# Patient Record
Sex: Male | Born: 1945 | Race: White | Hispanic: No | Marital: Married | Smoking: Never smoker
Health system: Southern US, Community
[De-identification: ages and names within clinical notes are randomized; demographics above are authoritative.]

## PROBLEM LIST (undated history)

## (undated) DIAGNOSIS — I639 Cerebral infarction, unspecified: Secondary | ICD-10-CM

## (undated) HISTORY — PX: TONSILLECTOMY: SUR1361

---

## 2020-12-07 ENCOUNTER — Emergency Department (HOSPITAL_COMMUNITY): Payer: Medicare Other

## 2020-12-07 ENCOUNTER — Inpatient Hospital Stay (HOSPITAL_COMMUNITY)
Admission: EM | Admit: 2020-12-07 | Discharge: 2020-12-13 | DRG: 040 | Disposition: A | Discharge status: Rehabilitation Facility | Payer: Medicare Other | Attending: Family Medicine | Admitting: Family Medicine

## 2020-12-07 ENCOUNTER — Encounter (HOSPITAL_COMMUNITY): Payer: Self-pay | Admitting: Internal Medicine

## 2020-12-07 DIAGNOSIS — R471 Dysarthria and anarthria: Secondary | ICD-10-CM | POA: Diagnosis present

## 2020-12-07 DIAGNOSIS — M8448XA Pathological fracture, other site, initial encounter for fracture: Secondary | ICD-10-CM | POA: Diagnosis present

## 2020-12-07 DIAGNOSIS — R531 Weakness: Secondary | ICD-10-CM

## 2020-12-07 DIAGNOSIS — I6340 Cerebral infarction due to embolism of unspecified cerebral artery: Principal | ICD-10-CM | POA: Diagnosis present

## 2020-12-07 DIAGNOSIS — I639 Cerebral infarction, unspecified: Secondary | ICD-10-CM

## 2020-12-07 DIAGNOSIS — N319 Neuromuscular dysfunction of bladder, unspecified: Secondary | ICD-10-CM | POA: Diagnosis present

## 2020-12-07 DIAGNOSIS — R001 Bradycardia, unspecified: Secondary | ICD-10-CM

## 2020-12-07 DIAGNOSIS — N5089 Other specified disorders of the male genital organs: Secondary | ICD-10-CM | POA: Diagnosis present

## 2020-12-07 DIAGNOSIS — G4700 Insomnia, unspecified: Secondary | ICD-10-CM | POA: Diagnosis present

## 2020-12-07 DIAGNOSIS — G8250 Quadriplegia, unspecified: Secondary | ICD-10-CM | POA: Diagnosis present

## 2020-12-07 DIAGNOSIS — R29716 NIHSS score 16: Secondary | ICD-10-CM | POA: Diagnosis not present

## 2020-12-07 DIAGNOSIS — R29718 NIHSS score 18: Secondary | ICD-10-CM | POA: Diagnosis not present

## 2020-12-07 DIAGNOSIS — Z8249 Family history of ischemic heart disease and other diseases of the circulatory system: Secondary | ICD-10-CM

## 2020-12-07 DIAGNOSIS — Z79899 Other long term (current) drug therapy: Secondary | ICD-10-CM

## 2020-12-07 DIAGNOSIS — Z66 Do not resuscitate: Secondary | ICD-10-CM | POA: Diagnosis present

## 2020-12-07 DIAGNOSIS — R29711 NIHSS score 11: Secondary | ICD-10-CM | POA: Diagnosis not present

## 2020-12-07 DIAGNOSIS — R29713 NIHSS score 13: Secondary | ICD-10-CM | POA: Diagnosis present

## 2020-12-07 DIAGNOSIS — Z28310 Unvaccinated for covid-19: Secondary | ICD-10-CM

## 2020-12-07 DIAGNOSIS — K592 Neurogenic bowel, not elsewhere classified: Secondary | ICD-10-CM | POA: Diagnosis present

## 2020-12-07 DIAGNOSIS — R338 Other retention of urine: Secondary | ICD-10-CM | POA: Diagnosis present

## 2020-12-07 DIAGNOSIS — M899 Disorder of bone, unspecified: Secondary | ICD-10-CM

## 2020-12-07 DIAGNOSIS — I10 Essential (primary) hypertension: Secondary | ICD-10-CM | POA: Diagnosis present

## 2020-12-07 DIAGNOSIS — R7989 Other specified abnormal findings of blood chemistry: Secondary | ICD-10-CM | POA: Diagnosis present

## 2020-12-07 DIAGNOSIS — R9389 Abnormal findings on diagnostic imaging of other specified body structures: Secondary | ICD-10-CM

## 2020-12-07 DIAGNOSIS — F32A Depression, unspecified: Secondary | ICD-10-CM | POA: Diagnosis present

## 2020-12-07 DIAGNOSIS — Z20822 Contact with and (suspected) exposure to covid-19: Secondary | ICD-10-CM | POA: Diagnosis present

## 2020-12-07 DIAGNOSIS — G9511 Acute infarction of spinal cord (embolic) (nonembolic): Secondary | ICD-10-CM | POA: Diagnosis present

## 2020-12-07 DIAGNOSIS — D65 Disseminated intravascular coagulation [defibrination syndrome]: Secondary | ICD-10-CM | POA: Diagnosis present

## 2020-12-07 DIAGNOSIS — E8770 Fluid overload, unspecified: Secondary | ICD-10-CM | POA: Diagnosis present

## 2020-12-07 DIAGNOSIS — N401 Enlarged prostate with lower urinary tract symptoms: Secondary | ICD-10-CM | POA: Diagnosis present

## 2020-12-07 LAB — COMPREHENSIVE METABOLIC PANEL
ALT: 13 U/L (ref 0–44)
AST: 30 U/L (ref 15–41)
Albumin: 4 g/dL (ref 3.5–5.0)
Alkaline Phosphatase: 53 U/L (ref 38–126)
Anion gap: 10 (ref 5–15)
BUN: 13 mg/dL (ref 8–23)
CO2: 23 mmol/L (ref 22–32)
Calcium: 9.6 mg/dL (ref 8.9–10.3)
Chloride: 102 mmol/L (ref 98–111)
Creatinine, Ser: 1 mg/dL (ref 0.61–1.24)
GFR, Estimated: >60 mL/min (ref >60)
Glucose, Bld: 124 mg/dL — ABNORMAL HIGH (ref 70–99)
Potassium: 4.1 mmol/L (ref 3.5–5.1)
Sodium: 135 mmol/L (ref 135–145)
Total Bilirubin: 2.9 mg/dL — ABNORMAL HIGH (ref 0.3–1.2)
Total Protein: 6.1 g/dL — ABNORMAL LOW (ref 6.5–8.1)

## 2020-12-07 LAB — URINALYSIS, COMPLETE (UACMP) WITH MICROSCOPIC
Bilirubin Urine: NEGATIVE
Glucose, UA: NEGATIVE mg/dL
Ketones, ur: 5 mg/dL — AB
Leukocytes,Ua: NEGATIVE
Nitrite: NEGATIVE
Protein, ur: >=300 mg/dL — AB
Specific Gravity, Urine: 1.008 (ref 1.005–1.030)
pH: 8 (ref 5.0–8.0)

## 2020-12-07 LAB — RESP PANEL BY RT-PCR (FLU A&B, COVID) ARPGX2
Influenza A by PCR: NEGATIVE
Influenza B by PCR: NEGATIVE
SARS Coronavirus 2 by RT PCR: NEGATIVE

## 2020-12-07 LAB — RAPID URINE DRUG SCREEN, HOSP PERFORMED
Amphetamines: NOT DETECTED
Barbiturates: NOT DETECTED
Benzodiazepines: NOT DETECTED
Cocaine: NOT DETECTED
Opiates: NOT DETECTED
Tetrahydrocannabinol: NOT DETECTED

## 2020-12-07 LAB — CBC WITH DIFFERENTIAL/PLATELET
Abs Immature Granulocytes: 0.07 K/uL (ref 0.00–0.07)
Basophils Absolute: 0 K/uL (ref 0.0–0.1)
Basophils Relative: 0 %
Eosinophils Absolute: 0 K/uL (ref 0.0–0.5)
Eosinophils Relative: 0 %
HCT: 43.1 % (ref 39.0–52.0)
Hemoglobin: 15.2 g/dL (ref 13.0–17.0)
Immature Granulocytes: 1 %
Lymphocytes Relative: 4 %
Lymphs Abs: 0.3 K/uL — ABNORMAL LOW (ref 0.7–4.0)
MCH: 31.9 pg (ref 26.0–34.0)
MCHC: 35.3 g/dL (ref 30.0–36.0)
MCV: 90.4 fL (ref 80.0–100.0)
Monocytes Absolute: 0.6 K/uL (ref 0.1–1.0)
Monocytes Relative: 7 %
Neutro Abs: 7.7 K/uL (ref 1.7–7.7)
Neutrophils Relative %: 88 %
Platelets: 103 K/uL — ABNORMAL LOW (ref 150–400)
RBC: 4.77 MIL/uL (ref 4.22–5.81)
RDW: 12.6 % (ref 11.5–15.5)
WBC: 8.8 K/uL (ref 4.0–10.5)
nRBC: 0 % (ref 0.0–0.2)

## 2020-12-07 LAB — PROTIME-INR
INR: 1.8 — ABNORMAL HIGH (ref 0.8–1.2)
Prothrombin Time: 20.5 s — ABNORMAL HIGH (ref 11.4–15.2)

## 2020-12-07 LAB — ETHANOL: Alcohol, Ethyl (B): <10 mg/dL

## 2020-12-07 LAB — CK: Total CK: 82 U/L (ref 49–397)

## 2020-12-07 LAB — CBG MONITORING, ED: Glucose-Capillary: 111 mg/dL — ABNORMAL HIGH (ref 70–99)

## 2020-12-07 IMAGING — DX DG CHEST 1V PORT
1 series · 1 of 1 positions shown · non-contrast
Comparison: None.

CLINICAL DATA: Altered level of consciousness.

EXAM:
PORTABLE CHEST 1 VIEW

[chest]
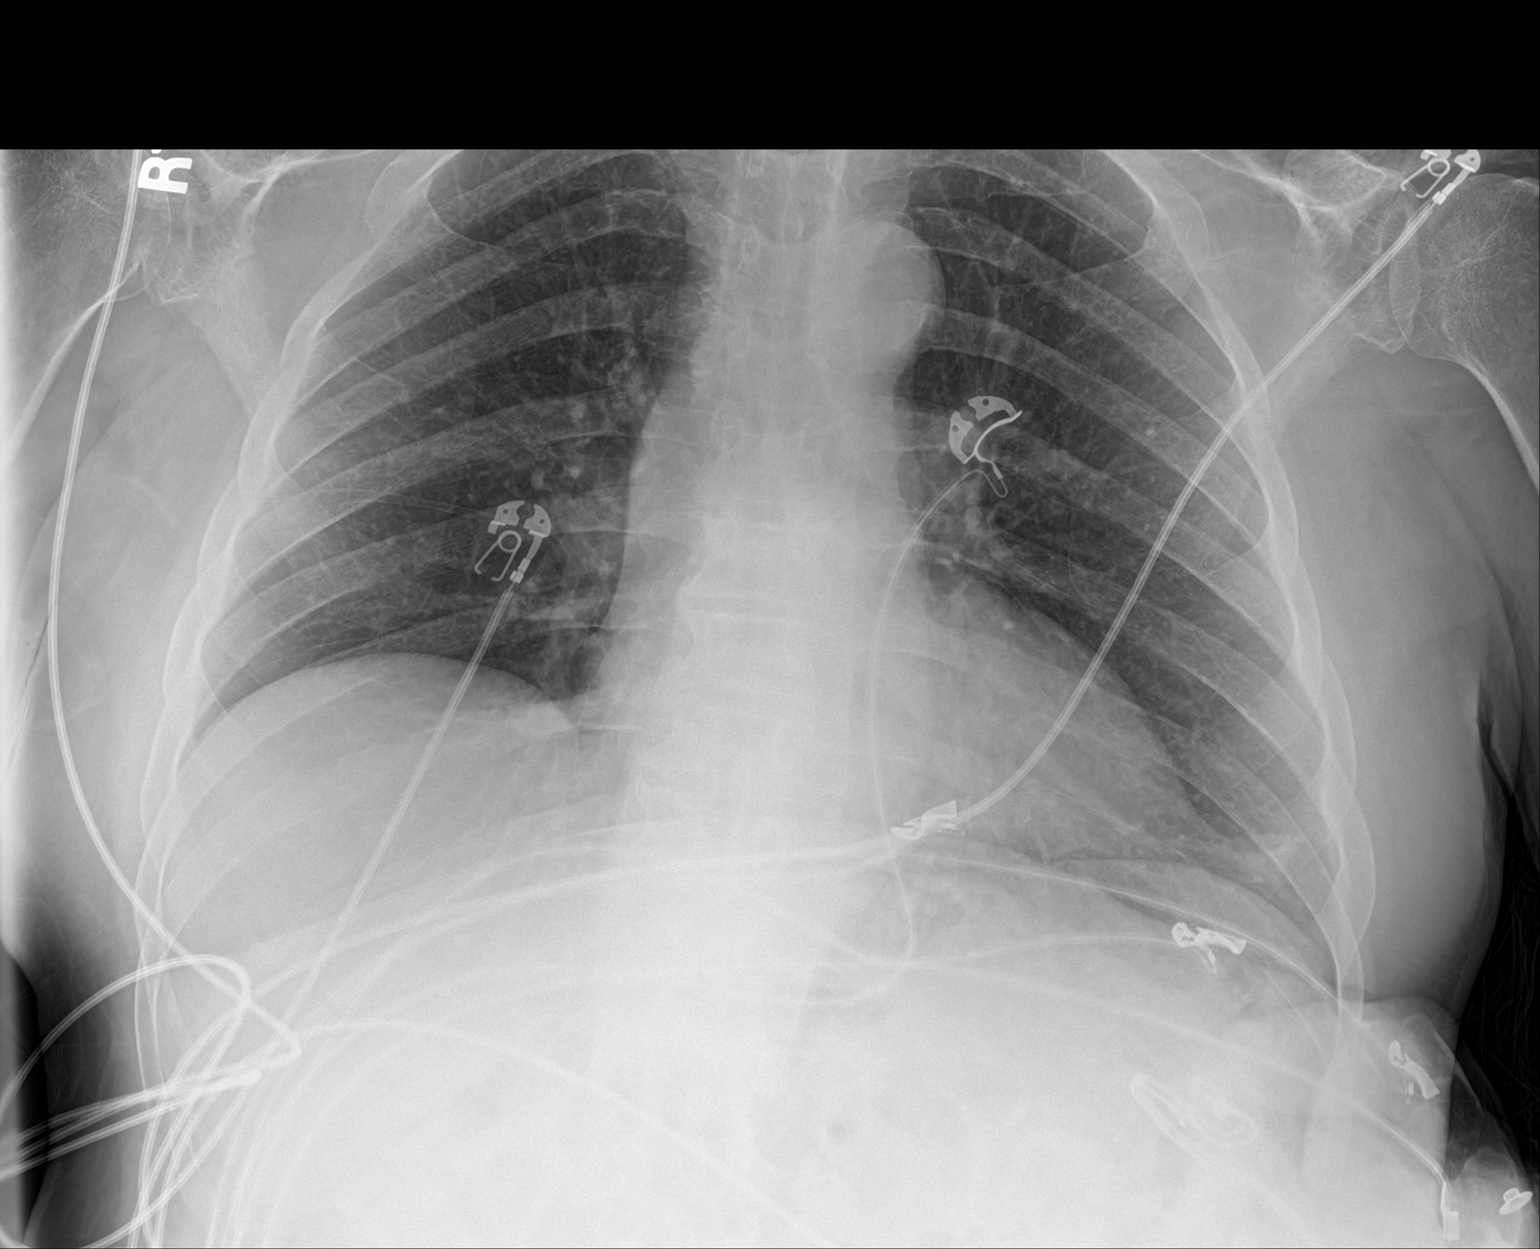

[1 of 1 positions shown; findings below may reference images not displayed]

FINDINGS: The heart size and mediastinal contours are within normal limits.
Both lungs are clear. The visualized skeletal structures are
unremarkable.
IMPRESSION: No active disease.

## 2020-12-07 IMAGING — MR MR HEAD WO/W CM
6 of 12 series · 26 of 48 positions shown · IV contrast (gadavist)
Comparison: Head CTA 522

CLINICAL DATA: Neuro deficit, acute, stroke suspected.  Weakness.

EXAM:
MRI HEAD WITHOUT AND WITH CONTRAST
TECHNIQUE: Multiplanar, multiecho pulse sequences of the brain and surrounding
structures were obtained without and with intravenous contrast.
CONTRAST:  7mL GADAVIST GADOBUTROL 1 MMOL/ML IV SOLN

[Series 4: DWI · axial · 3.0mm · 0.94mm/px · z∈[+2,+158]mm · 8 of 107 slices shown (1 of 2)]
[im 1/107]
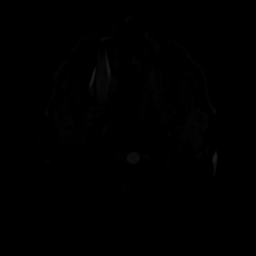
[im 16/107]
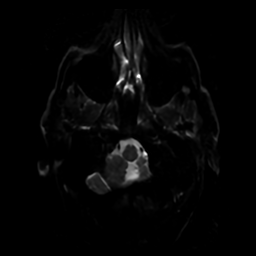
[im 31/107]
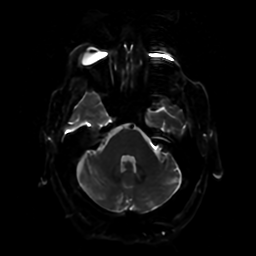
[im 46/107]
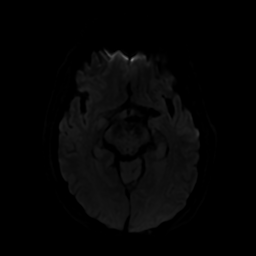
[im 61/107]
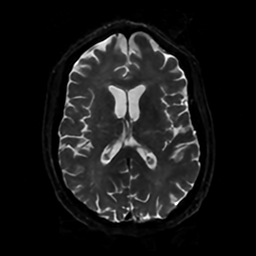
[im 76/107]
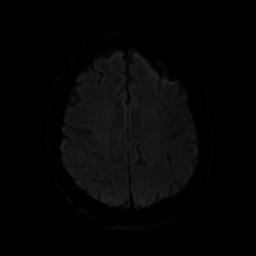
[im 91/107]
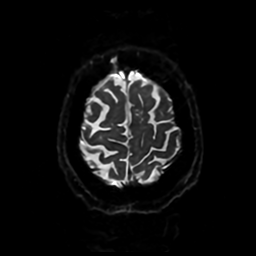
[im 107/107]
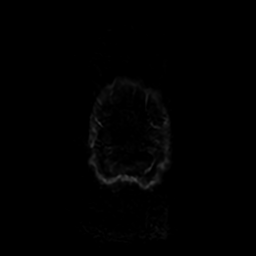

[Series 5: DWI · coronal · 4.0mm · 0.94mm/px · 6 of 81 slices shown (2 of 2)]
[im 1/81]
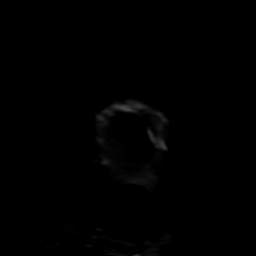
[im 17/81]
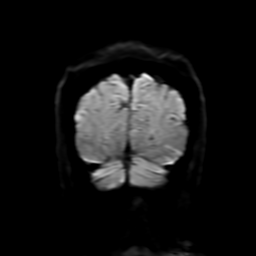
[im 33/81]
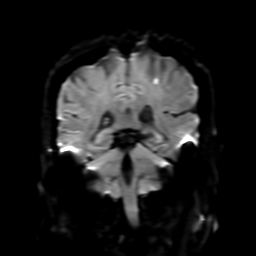
[im 49/81]
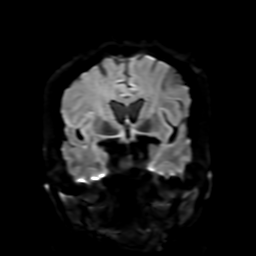
[im 65/81]
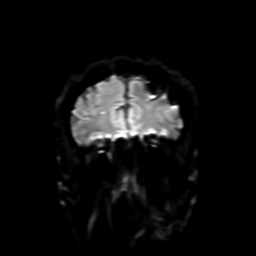
[im 81/81]
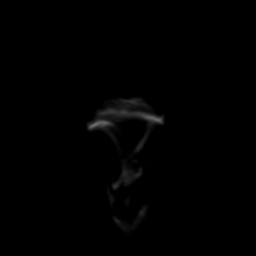

[Series 6: FLAIR · sagittal · 5.0mm · 0.23mm/px · 2 of 25 slices shown (1 of 2)]
[im 1/25]
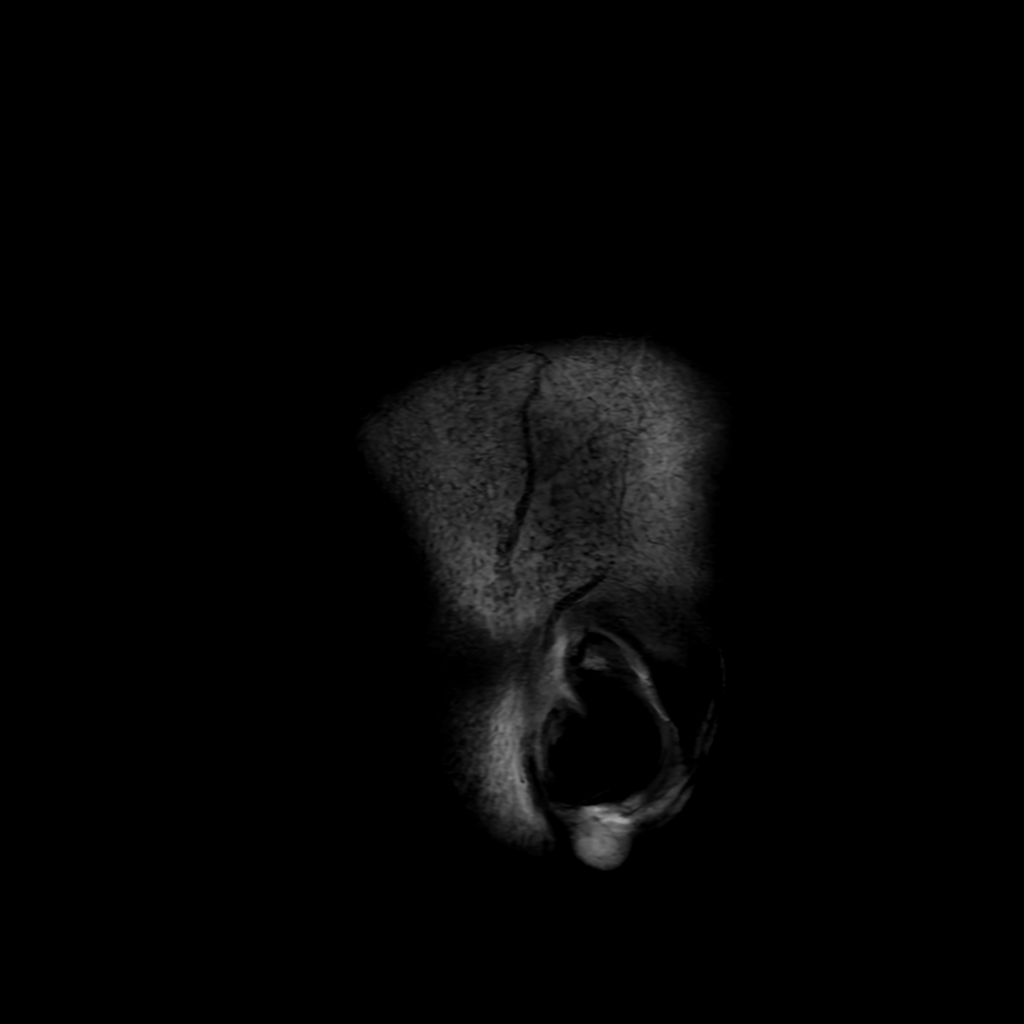
[im 25/25]
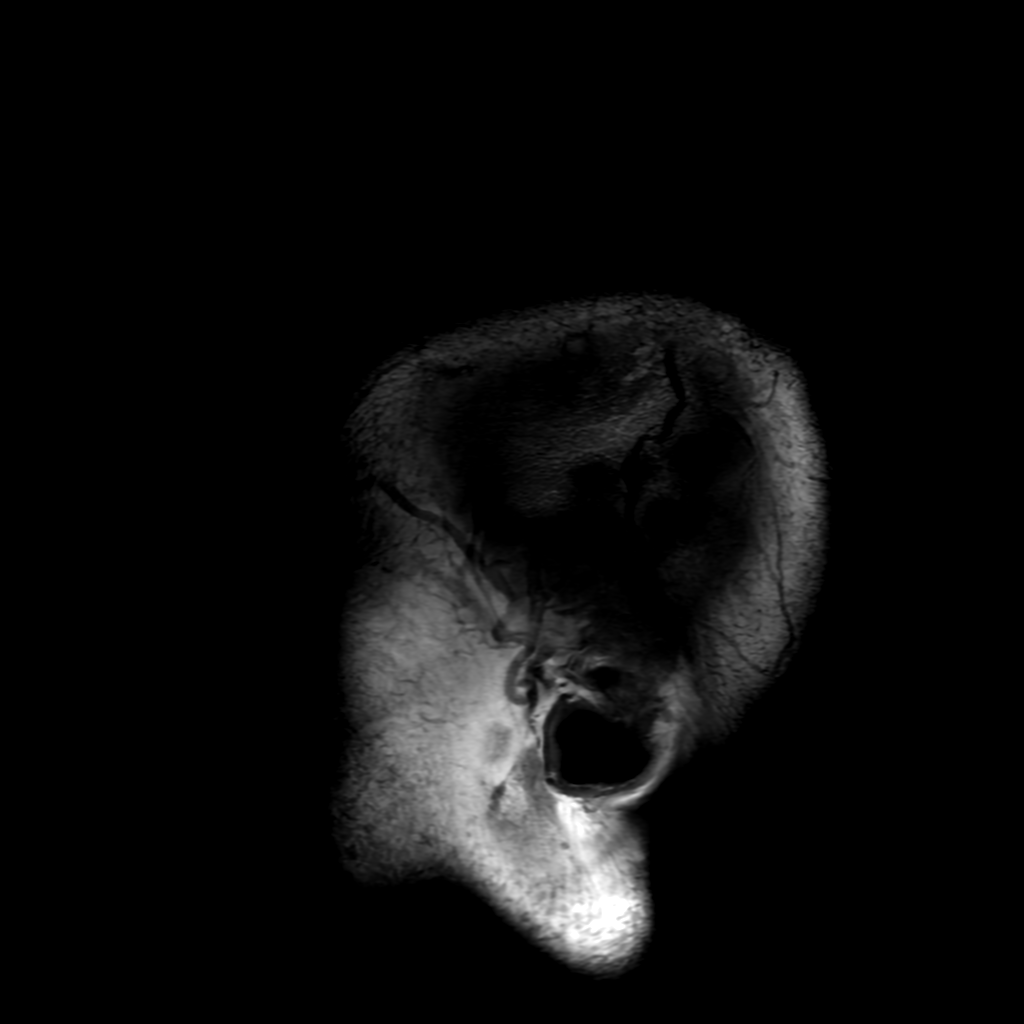

[Series 8: FLAIR · axial · 4.0mm · 0.45mm/px · z∈[+2,+158]mm · 3 of 37 slices shown (2 of 2)]
[im 1/37]
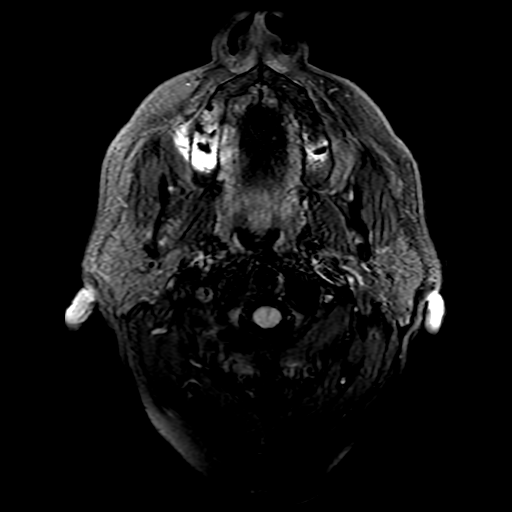
[im 19/37]
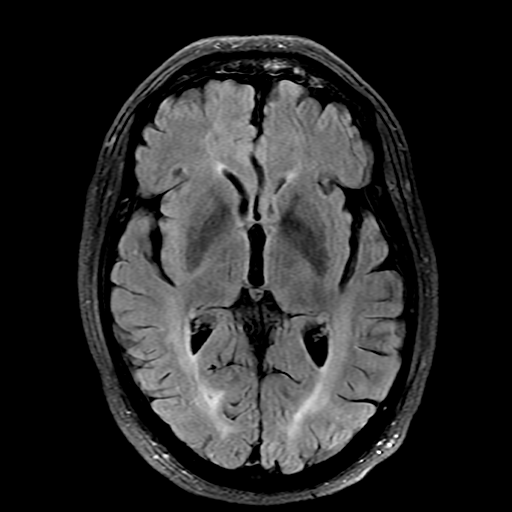
[im 37/37]
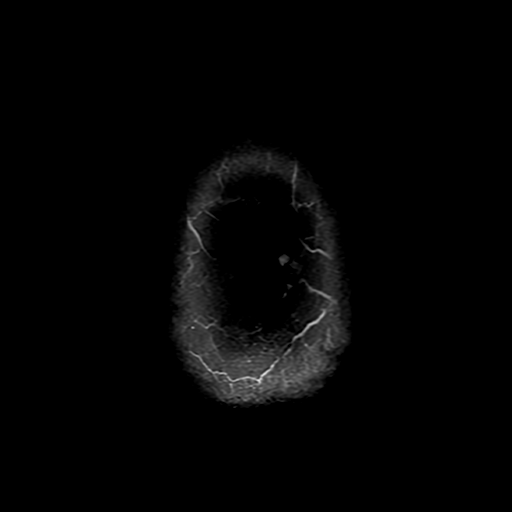

[Series 450: ADC · axial · 3.0mm · 0.94mm/px · z∈[+2,+158]mm · 4 of 54 slices shown (1 of 2)]
[im 1/54]
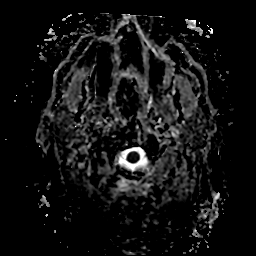
[im 18/54]
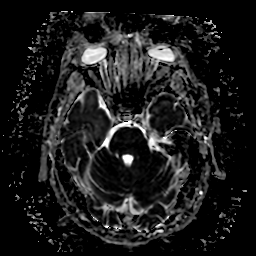
[im 36/54]
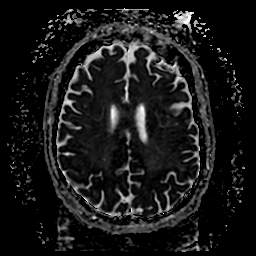
[im 54/54]
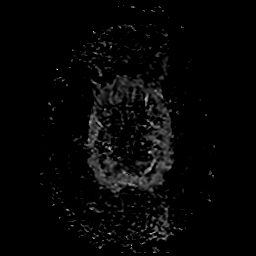

[Series 550: ADC · coronal · 4.0mm · 0.94mm/px · 3 of 40 slices shown (2 of 2)]
[im 1/40]
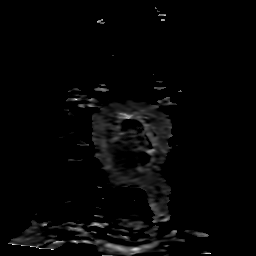
[im 20/40]
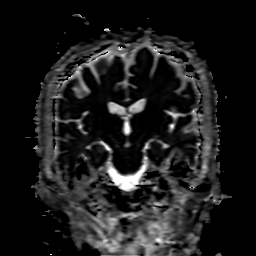
[im 40/40]
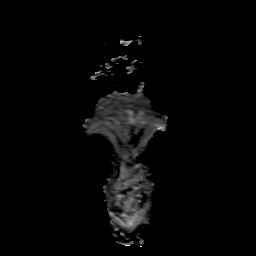

[26 of 48 positions shown; findings below may reference images not displayed]

FINDINGS: Brain: There is a 6 mm acute cortical/subcortical infarct in the
left perirolandic region. Scattered small T2 hyperintensities in the
cerebral white matter bilaterally are nonspecific but compatible
with mild chronic small vessel ischemic disease. The ventricles and
sulci are within normal limits for age. No intracranial hemorrhage,
mass, midline shift, extra-axial fluid collection, or abnormal
enhancement is identified.

Vascular: Major intracranial vascular flow voids are preserved.

Skull and upper cervical spine: Unremarkable bone marrow signal.
Asymmetric left frontal hyperostosis interna.

Sinuses/Orbits: Unremarkable orbits. Unremarkable orbits. Mild
bilateral ethmoid air cell mucosal thickening.

Other: None.
IMPRESSION: 1. 6 mm acute left perirolandic infarct.
2. Mild chronic small vessel ischemic disease.

## 2020-12-07 IMAGING — CT CT HEAD W/O CM
4 series · 17 of 47 positions shown, 19 images · non-contrast
Comparison: No pertinent prior exams available for comparison.

CLINICAL DATA: Mental status change, unknown cause. Altered mental
status.

EXAM:
CT HEAD WITHOUT CONTRAST
TECHNIQUE: Contiguous axial images were obtained from the base of the skull
through the vertex without intravenous contrast.

[Series 3: head without · axial · non-contrast · 0.48mm/px · z∈[-116,+14]mm · 7 of 36 slices shown, 9 images]
[im 5/36  brain]
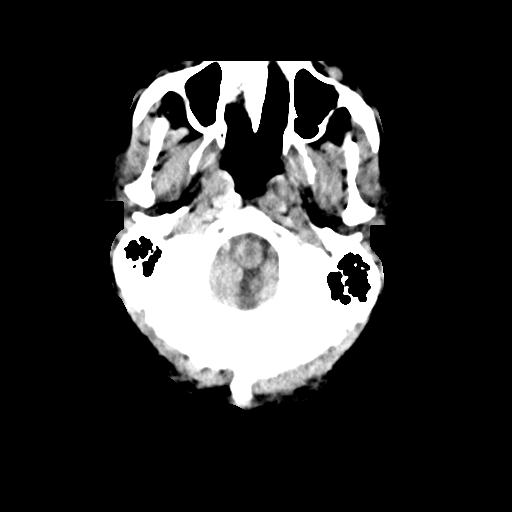
[im 5/36  bone]
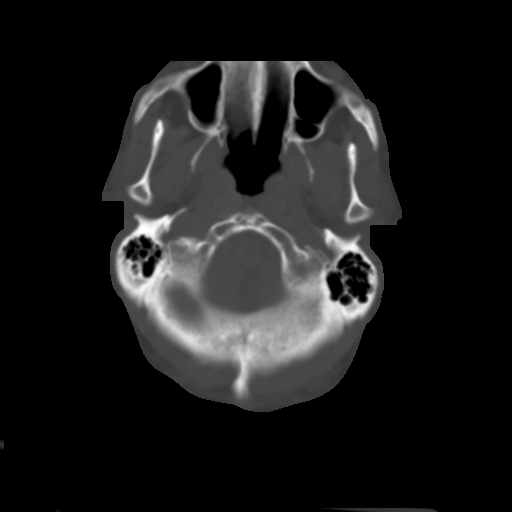
[im 9/36  brain]
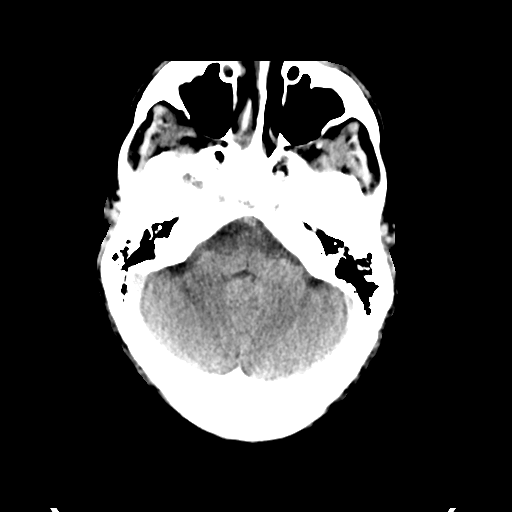
[im 14/36  brain]
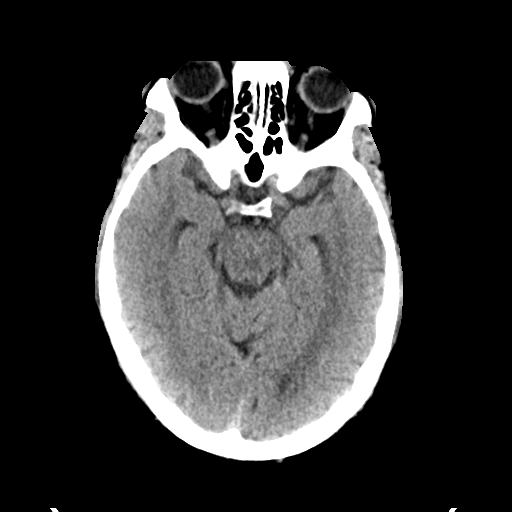
[im 18/36  brain]
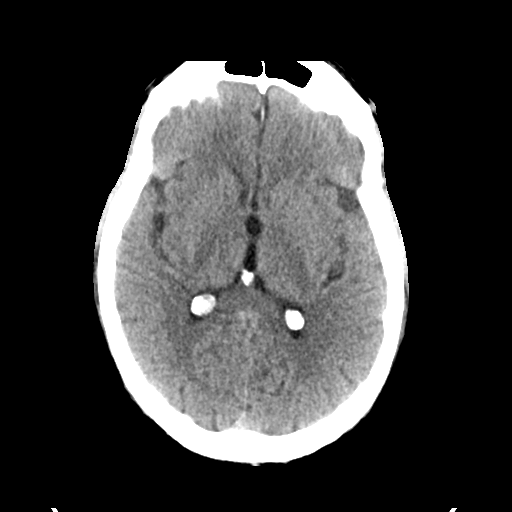
[im 22/36  brain]
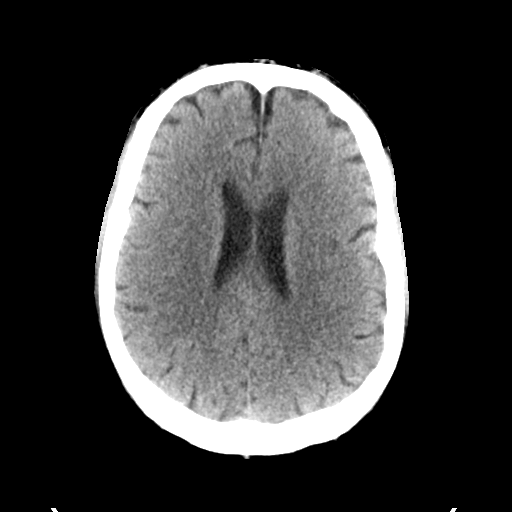
[im 22/36  bone]
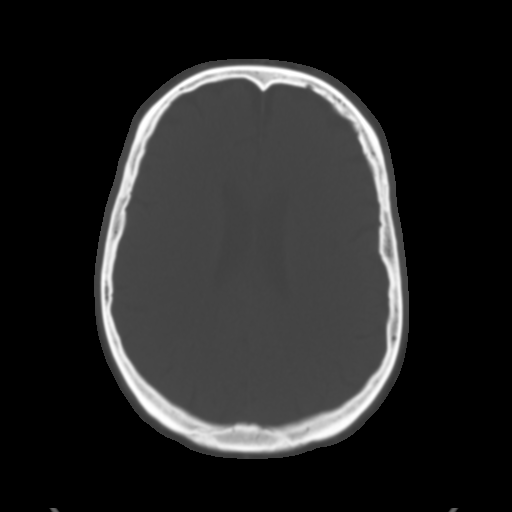
[im 27/36  brain]
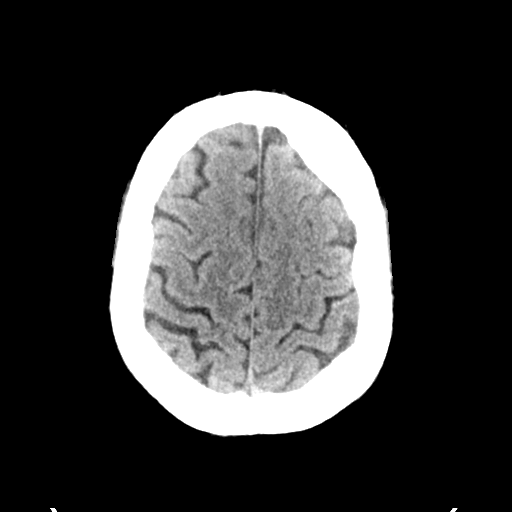
[im 31/36  brain]
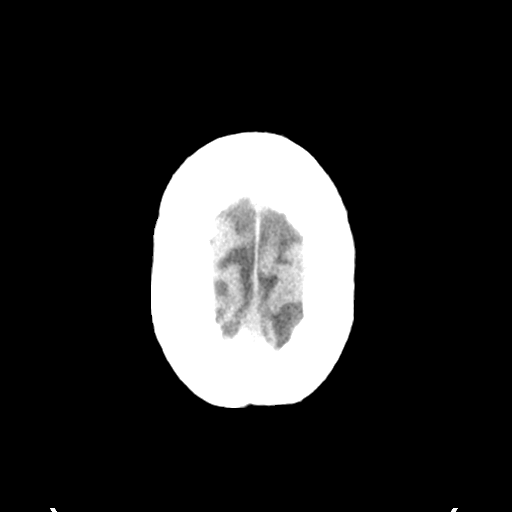

[Series 4: head bone · axial · 0.48mm/px · z∈[-120,-56]mm · 4 of 90 slices shown]
[im 9/90  bone]
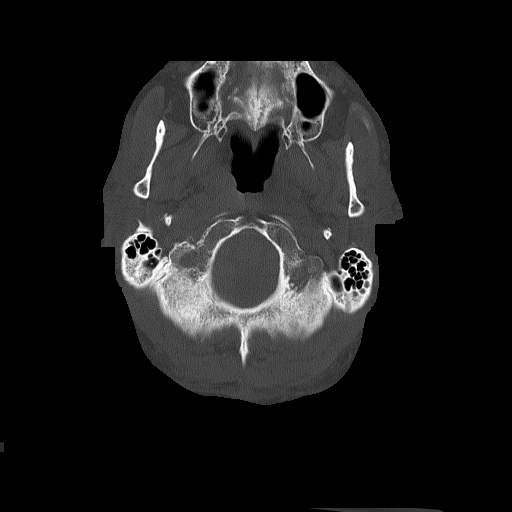
[im 18/90  bone]
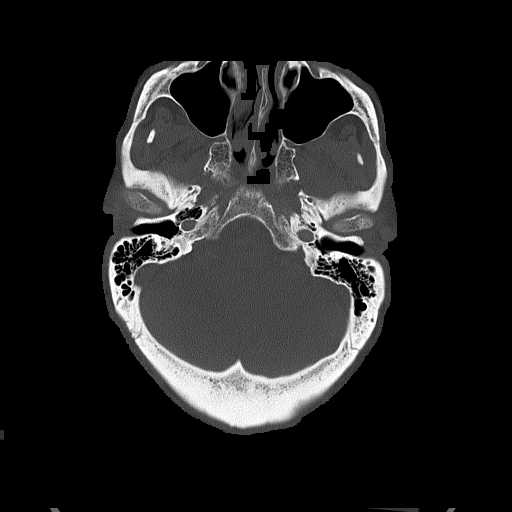
[im 27/90  bone]
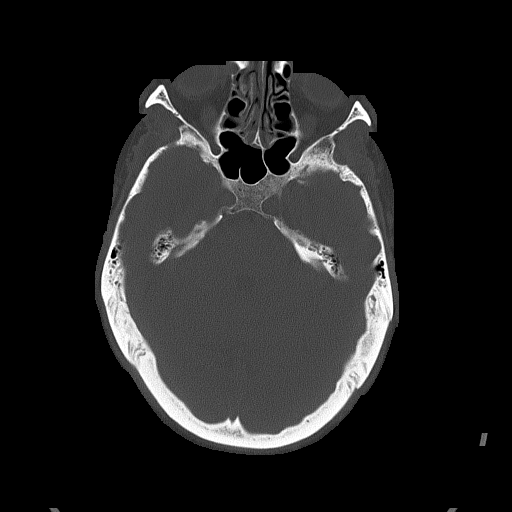
[im 41/90  bone]
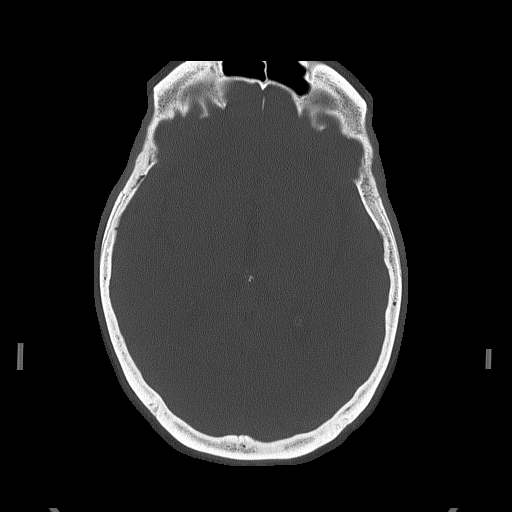

[Series 5: head without cor · coronal · non-contrast · 0.36mm/px · 3 of 68 slices shown]
[im 23/68  brain]
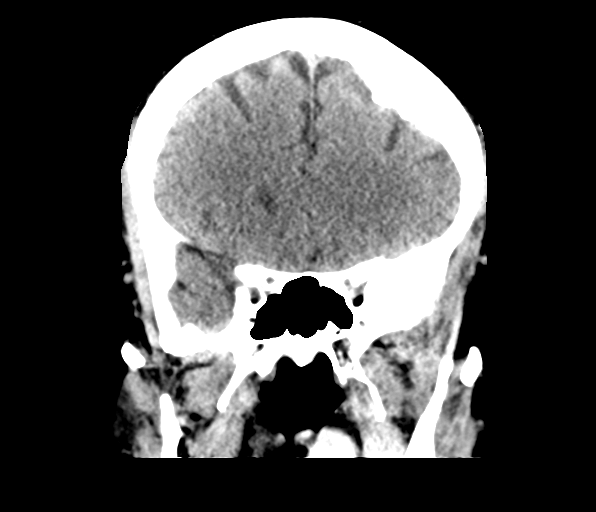
[im 30/68  brain]
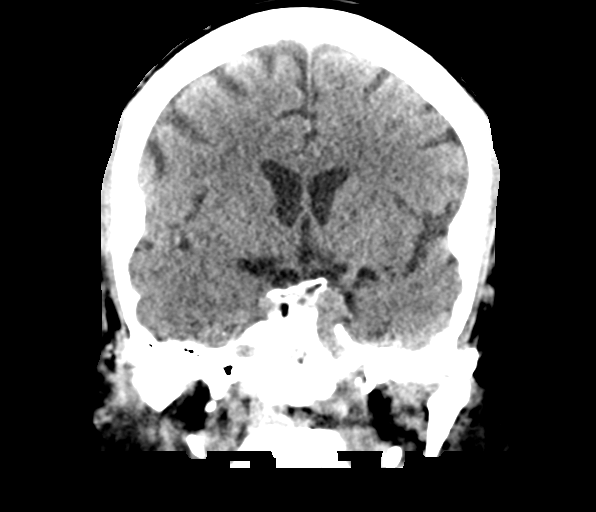
[im 38/68  brain]
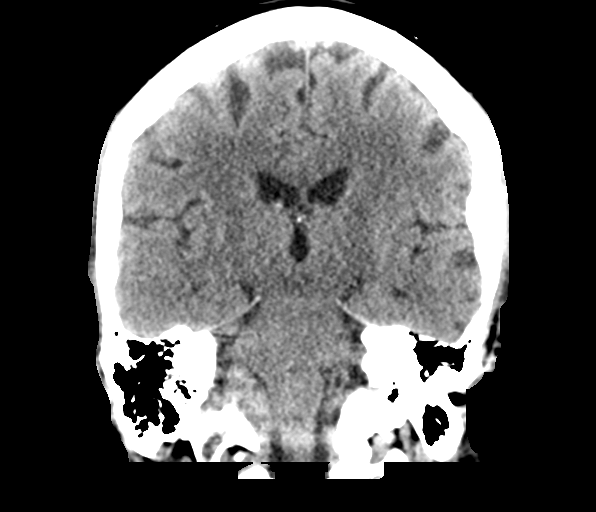

[Series 6: head without sag · sagittal · non-contrast · 0.46mm/px · 3 of 49 slices shown]
[im 17/49  brain]
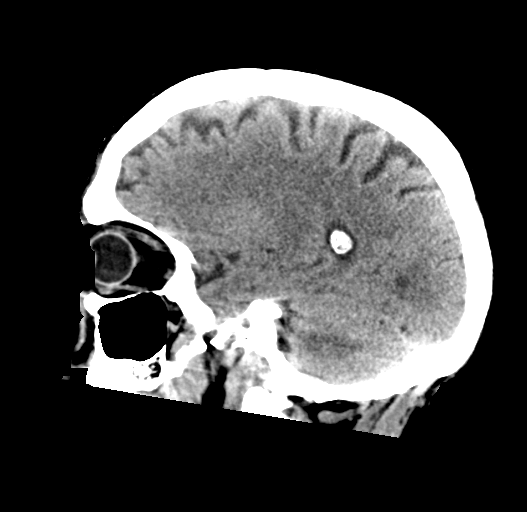
[im 25/49  brain]
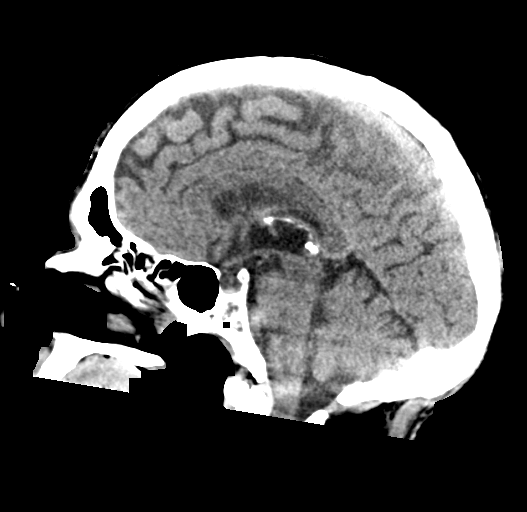
[im 33/49  brain]
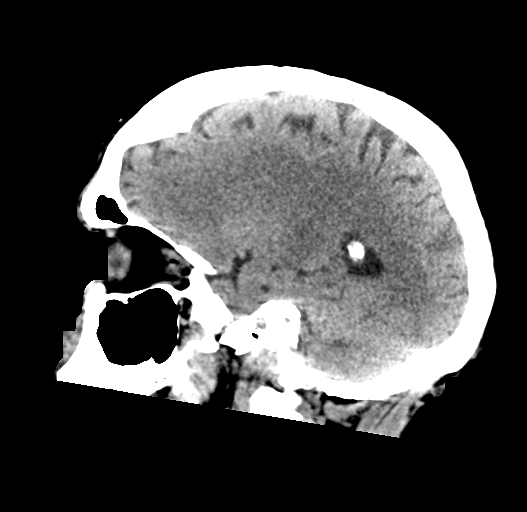

[17 of 47 positions shown; findings below may reference images not displayed]

FINDINGS: Brain:

Cerebral volume is normal.

There is no acute intracranial hemorrhage.

No demarcated cortical infarct.

No extra-axial fluid collection.

No evidence of an intracranial mass.

No midline shift.

Vascular: No hyperdense vessel.  Atherosclerotic calcifications

Skull: No calvarial fracture or focal suspicious osseous lesion.
Hyperostosis frontalis interna, predominantly on the left.

Sinuses/Orbits: Visualized orbits show no acute finding. Mild
bilateral ethmoid sinus mucosal thickening. Trace mucosal thickening
within the bilateral sphenoid sinuses. Small bilateral maxillary
sinus mucous retention cysts.
IMPRESSION: No evidence of acute intracranial abnormality.

Mild paranasal sinus disease, as described.

## 2020-12-07 IMAGING — MR MR CERVICAL SPINE WO/W CM
4 of 8 series · 17 of 48 positions shown · IV contrast (gadavist)
Comparison: None.

CLINICAL DATA: Demyelinating disease.  Weakness.

EXAM:
MRI CERVICAL SPINE WITHOUT AND WITH CONTRAST
TECHNIQUE: Multiplanar and multiecho pulse sequences of the cervical spine, to
include the craniocervical junction and cervicothoracic junction,
were obtained without and with intravenous contrast.
CONTRAST:  7mL GADAVIST GADOBUTROL 1 MMOL/ML IV SOLN

[Series 12: T2 · sagittal · 3.0mm · 0.43mm/px · 2 of 17 slices shown (1 of 2)]
[im 1/17]
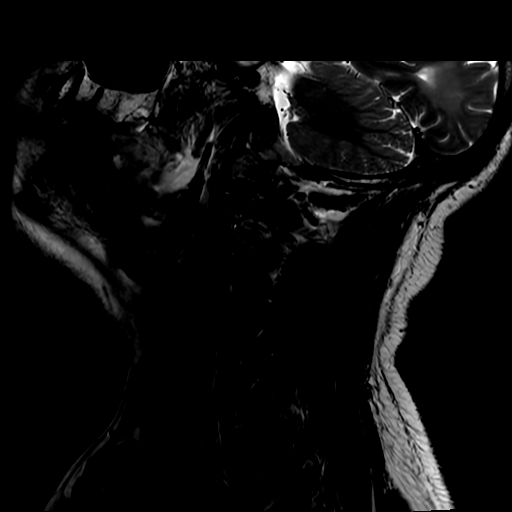
[im 17/17]
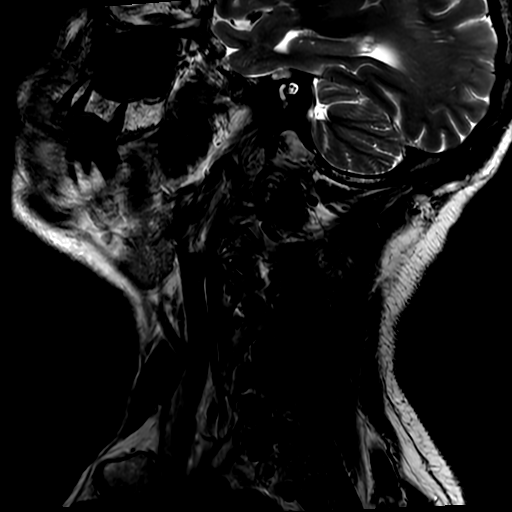

[Series 18: T2 · axial · 3.0mm · 0.35mm/px · z∈[-175,-52]mm · 6 of 41 slices shown (2 of 2)]
[im 1/41]
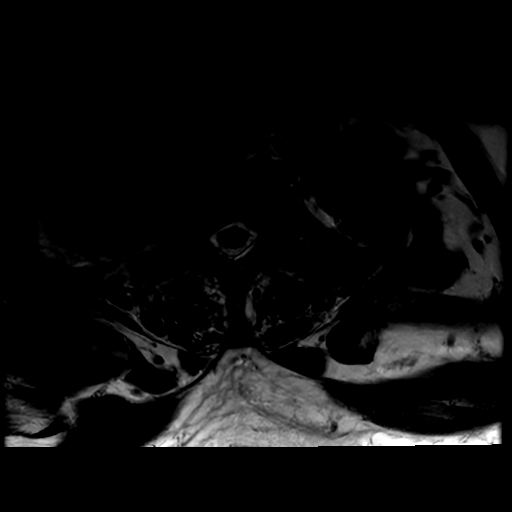
[im 9/41]
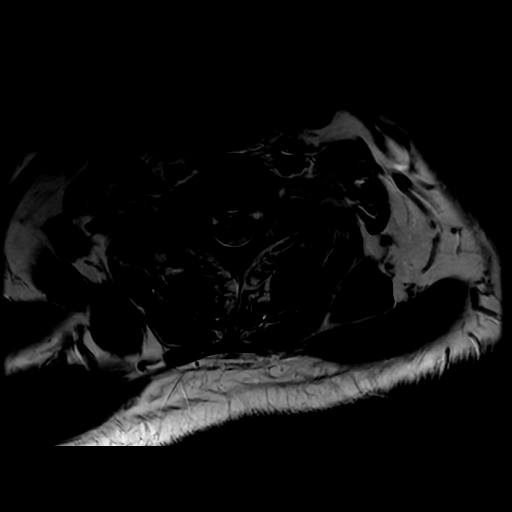
[im 17/41]
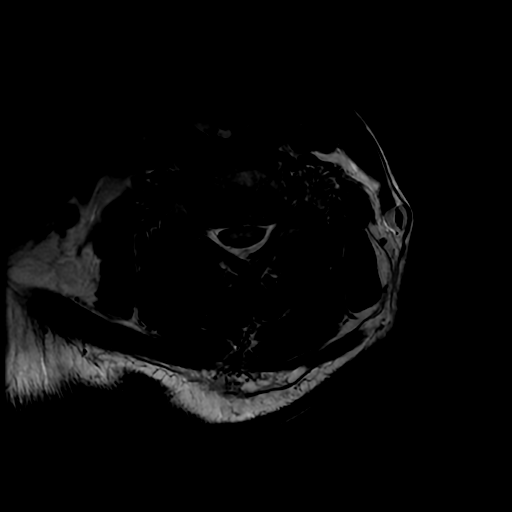
[im 25/41]
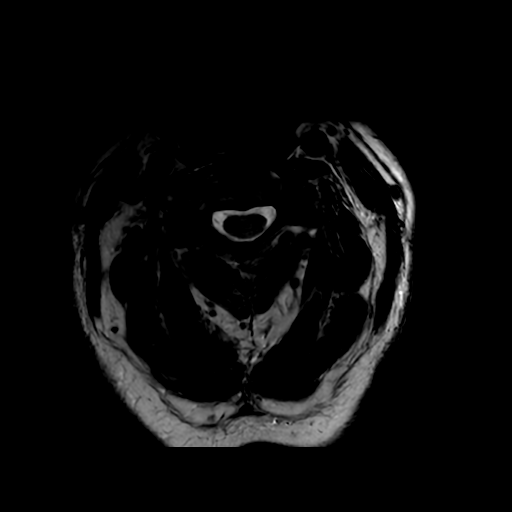
[im 33/41]
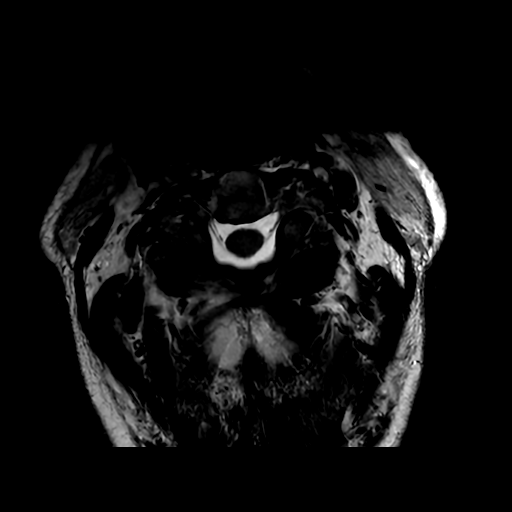
[im 41/41]
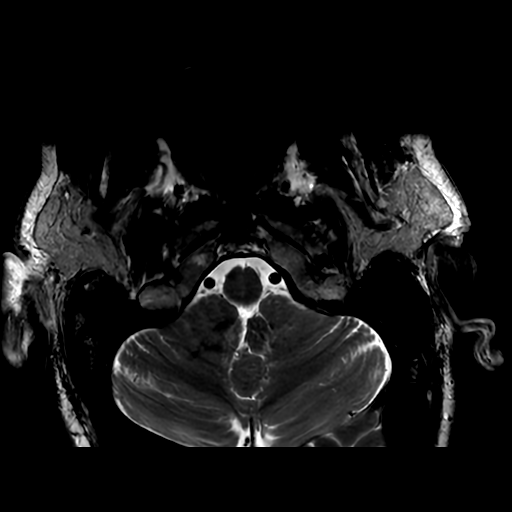

[Series 19: T1 · axial · non-contrast · 3.0mm · 0.35mm/px · z∈[-175,-52]mm · 6 of 41 slices shown]
[im 1/41]
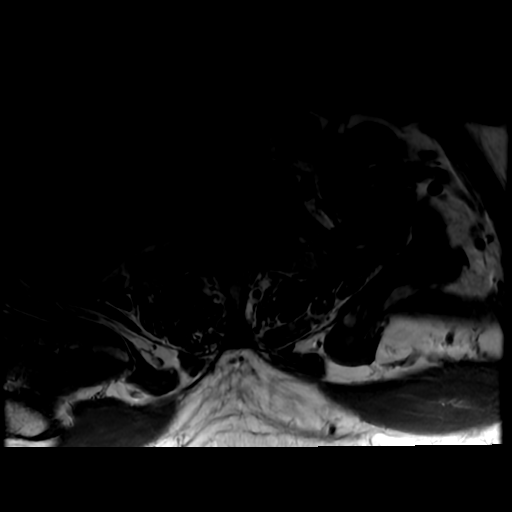
[im 9/41]
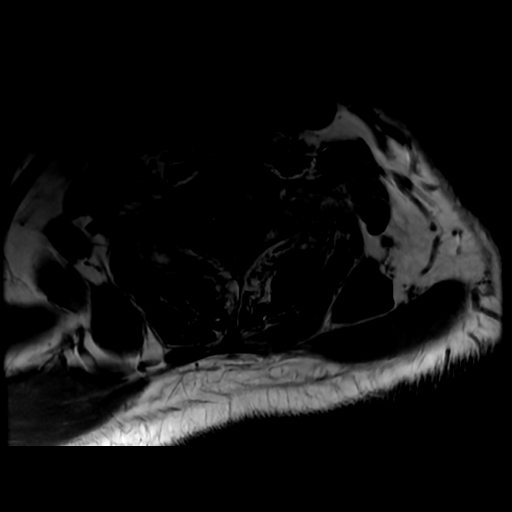
[im 17/41]
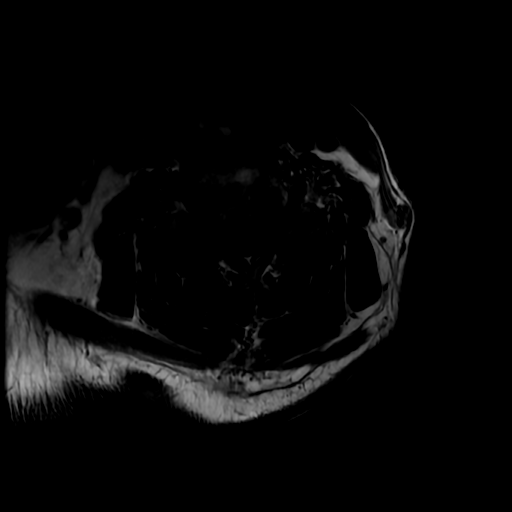
[im 25/41]
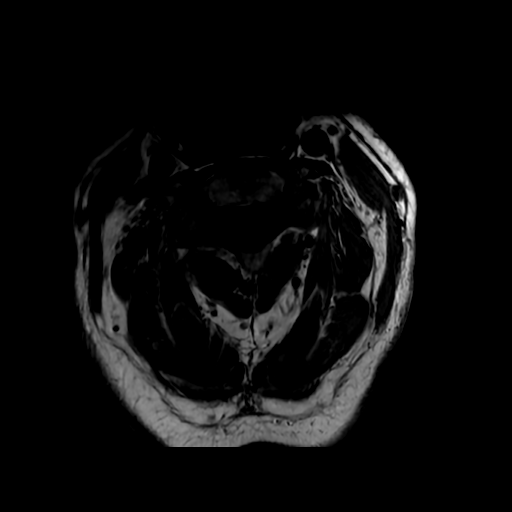
[im 33/41]
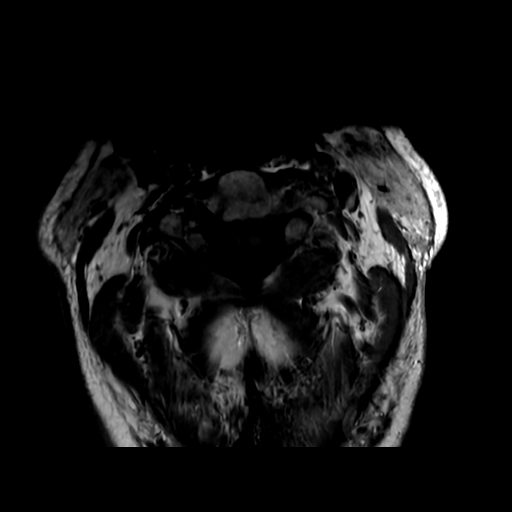
[im 41/41]
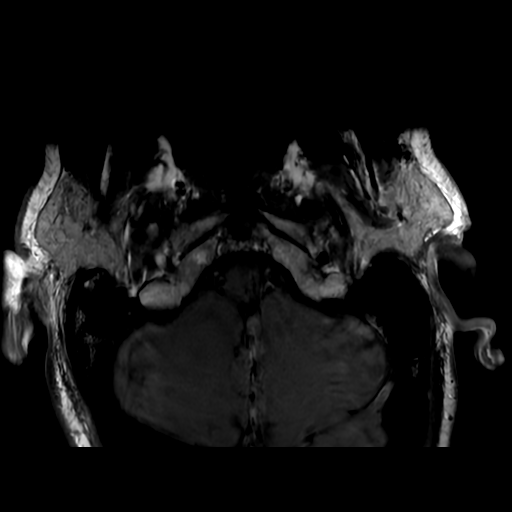

[Series 20: T1 fat-sat post-contrast · sagittal · 3.0mm · 0.43mm/px · 3 of 17 slices shown]
[im 1/17]
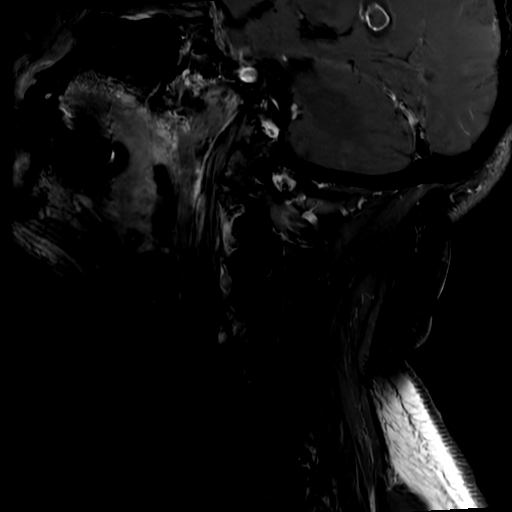
[im 9/17]
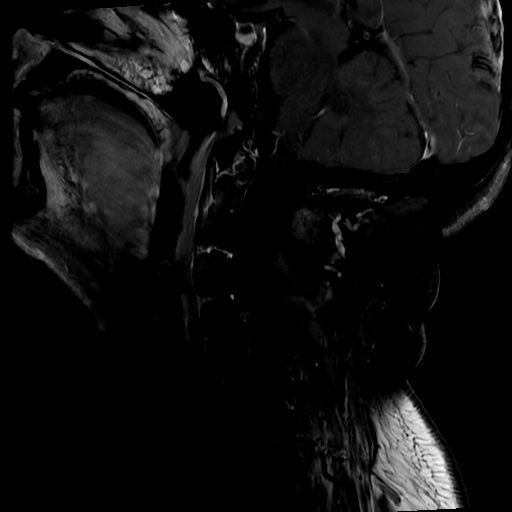
[im 17/17]
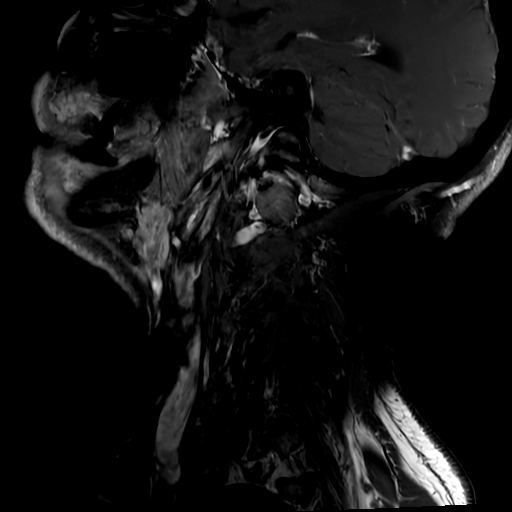

[17 of 48 positions shown; findings below may reference images not displayed]

FINDINGS: The study is motion degraded including severe motion on the axial T2
gradient echo sequence.

Alignment: Reversal of the normal cervical lordosis. Trace
anterolisthesis of C4 on C5 and C7 on T1.

Vertebrae: Abnormal marked T1 hypointensity, STIR hyperintensity,
and mild enhancement involving the marrow throughout the majority of
the T1 vertebral body and extending into the right-sided posterior
elements with an associated fracture involving the superior endplate
and posterior vertebral body cortex resulting in focal 40% vertebral
body height loss and 3 mm retropulsion of the posterior vertebral
body cortex. Expansion of the right T1 pedicle with suspected small
volume epidural tumor at T1. Abnormal signal hypointensity
posteriorly in the C6 and C7 vertebral bodies on the sagittal T1
postcontrast sequence without a corresponding abnormality on the
other sequences to confirm an underlying lesion. Degenerative
endplate changes at C5-6 and C6-7.

Cord: Mild T2 hyperintensity involving the central and ventral
spinal cord bilaterally from C5 into the included upper thoracic
spine without enhancement.

Posterior Fossa, vertebral arteries, paraspinal tissues:
Unremarkable.

Disc levels:

C2-3: Severe right facet arthrosis result in mild-to-moderate right
neural foraminal stenosis without spinal stenosis.

C3-4: Moderate facet arthrosis results in mild right neural
foraminal stenosis without spinal stenosis.

C4-5: Anterolisthesis with bulging uncovered disc, uncovertebral
spurring, and moderate right facet arthrosis result in mild spinal
stenosis and moderate right neural foraminal stenosis.

C5-6: Mild-to-moderate disc space narrowing. Disc bulging and
uncovertebral spurring result in moderate right and severe left
neural foraminal stenosis without significant spinal stenosis.

C6-7: Mild-to-moderate disc space narrowing. Disc bulging, a small
central disc protrusion, and uncovertebral spurring result in
borderline spinal stenosis and severe right and moderate to severe
left neural foraminal stenosis.

C7-T1: Anterolisthesis with disc uncovering, infolding of the
ligamentum flavum, and severe facet arthrosis result in
mild-to-moderate spinal stenosis and severe bilateral neural
foraminal stenosis.
IMPRESSION: 1. Lesion involving the T1 vertebral body and right-sided posterior
elements with associated pathologic vertebral fracture concerning
for metastatic disease or other neoplasm. Suspected small volume
epidural tumor at T1.
2. Abnormal T2 hyperintensity involving the central and ventral
aspects of the spinal cord from C5 into the included upper thoracic
spine. Considerations include cord infarct, transverse myelitis,
viral myelitis, and other inflammatory processes such as
sarcoidosis. No enhancement or masslike finding to suggest
intramedullary cord tumor although thoracic spine imaging is
recommended for further evaluation.
3. Disc and facet degeneration resulting in severe multilevel neural
foraminal stenosis as above.
4. Mild-to-moderate spinal stenosis at C7-T1.

## 2020-12-07 MED ORDER — SODIUM CHLORIDE 0.9 % IV BOLUS
1000.0000 mL | Freq: Once | INTRAVENOUS | Status: AC
  Administered 2020-12-07: 1000 mL via INTRAVENOUS

## 2020-12-07 MED ORDER — SODIUM CHLORIDE 0.9 % IV SOLN: INTRAVENOUS | Status: AC

## 2020-12-07 MED ORDER — ACETAMINOPHEN 650 MG RE SUPP: 650.0000 mg | RECTAL | Status: DC | PRN

## 2020-12-07 MED ORDER — ACETAMINOPHEN 160 MG/5ML PO SOLN
650.0000 mg | ORAL | Status: DC | PRN
  Administered 2020-12-13: 650 mg
  Filled 2020-12-07: qty 20.3

## 2020-12-07 MED ORDER — SENNOSIDES-DOCUSATE SODIUM 8.6-50 MG PO TABS: 1.0000 | ORAL_TABLET | Freq: Every evening | ORAL | Status: DC | PRN

## 2020-12-07 MED ORDER — GADOBUTROL 1 MMOL/ML IV SOLN
7.0000 mL | Freq: Once | INTRAVENOUS | Status: AC | PRN
  Administered 2020-12-07: 7 mL via INTRAVENOUS

## 2020-12-07 MED ORDER — LORAZEPAM 2 MG/ML IJ SOLN
1.0000 mg | Freq: Once | INTRAMUSCULAR | Status: AC
  Administered 2020-12-07: 1 mg via INTRAVENOUS
  Filled 2020-12-07: qty 1

## 2020-12-07 MED ORDER — ACETAMINOPHEN 325 MG PO TABS
650.0000 mg | ORAL_TABLET | ORAL | Status: DC | PRN
  Administered 2020-12-08 – 2020-12-10 (×3): 650 mg via ORAL
  Filled 2020-12-07 (×3): qty 2

## 2020-12-07 MED ORDER — SODIUM CHLORIDE 0.9 % IV SOLN: INTRAVENOUS | Status: DC

## 2020-12-07 MED ORDER — STROKE: EARLY STAGES OF RECOVERY BOOK: Freq: Once | Status: AC

## 2020-12-07 NOTE — ED Notes (Signed)
Pt to CT

## 2020-12-07 NOTE — ED Notes (Signed)
The patient requested to be turned on his side. This EMT with the assistance of [facility]-NT, turned the patient onto his left side and placed two pillows under his back to keep him propped in place.

## 2020-12-07 NOTE — ED Notes (Signed)
Remains in MRI 

## 2020-12-07 NOTE — ED Provider Notes (Signed)
[facility] EMERGENCY DEPARTMENT Provider Note   CSN: [id number] Arrival date & time: 12/07/20  1139     History Chief Complaint  Patient presents with   Weakness    Not new, but vast difference from yesterday to today    [name] is a 75 y.o. male.  HPI Presents via EMS.  History is obtained by those individuals, as related by the patient's son.  The patient self simply describes weakness, cannot specify additional details of his presentation, level 5 caveat secondary to acute of condition/mental status change. MS notes that the patient was out about yesterday doing yard work, but family reports that over the past week the patient has slowed dramatically, and today was globally weak, incapable of standing, interacting in a previously normal manner.     Patient denies medical problems, surgical problems, has not received COVID-vaccine. He lives at home Some limitation of social, medical secondary to acuity of condition/mental status change.     Home Medications Prior to Admission medications   Not on File    Allergies    Patient has no allergy information on record.  Review of Systems   Review of Systems  Unable to perform ROS: Acuity of condition   Physical Exam Updated Vital Signs BP (!) 155/78   Pulse 63   Temp 98.5 F (36.9 C) (Oral)   Resp 15   SpO2 98%   Physical Exam Vitals and nursing note reviewed.  Constitutional:      Appearance: He is well-developed. He is ill-appearing and diaphoretic.  HENT:     Head: Normocephalic and atraumatic.  Eyes:     Conjunctiva/sclera: Conjunctivae normal.  Cardiovascular:     Rate and Rhythm: Normal rate and regular rhythm.  Pulmonary:     Effort: Pulmonary effort is normal. No respiratory distress.     Breath sounds: No stridor.  Abdominal:     General: There is no distension.  Skin:    General: Skin is warm.  Neurological:     Mental Status: He is alert.     Motor: Tremor and abnormal  muscle tone present.     Comments: Patient holding arms in contraction, the legs are relaxed.  Strength is 4/5 in all extremities. Arreflexia in both LE.  Psychiatric:     Comments: Withdrawn, but verbal, slowly, only briefly, no insight into his presentation    ED Results / Procedures / Treatments   Labs (all labs ordered are listed, but only abnormal results are displayed) Labs Reviewed  COMPREHENSIVE METABOLIC PANEL - Abnormal; Notable for the following components:      Result Value   Glucose, Bld 124 (*)    Total Protein 6.1 (*)    Total Bilirubin 2.9 (*)    All other components within normal limits  CBC WITH DIFFERENTIAL/PLATELET - Abnormal; Notable for the following components:   Platelets 103 (*)    Lymphs Abs 0.3 (*)    All other components within normal limits  PROTIME-INR - Abnormal; Notable for the following components:   Prothrombin Time 20.5 (*)    INR 1.8 (*)    All other components within normal limits  CBG MONITORING, ED - Abnormal; Notable for the following components:   Glucose-Capillary 111 (*)    All other components within normal limits  RESP PANEL BY RT-PCR (FLU A&B, COVID) ARPGX2  ETHANOL  CK  RAPID URINE DRUG SCREEN, HOSP PERFORMED  URINALYSIS, COMPLETE (UACMP) WITH MICROSCOPIC    EKG EKG Interpretation  Date/Time:  Friday December 07 2020 11:42:44 EDT Ventricular Rate:  63 PR Interval:  197 QRS Duration: 87 QT Interval:  402 QTC Calculation: 412 R Axis:   -56 Text Interpretation: Sinus rhythm Left anterior fascicular block T wave abnormality Abnormal ECG Confirmed by Gerhard Munch [phone]) on 12/07/2020 11:49:42 AM  Radiology CT HEAD WO CONTRAST  Result Date: 12/07/2020 CLINICAL DATA:  Mental status change, unknown cause. Altered mental status. EXAM: CT HEAD WITHOUT CONTRAST TECHNIQUE: Contiguous axial images were obtained from the base of the skull through the vertex without intravenous contrast. COMPARISON:  No pertinent prior exams available  for comparison. FINDINGS: Brain: Cerebral volume is normal. There is no acute intracranial hemorrhage. No demarcated cortical infarct. No extra-axial fluid collection. No evidence of an intracranial mass. No midline shift. Vascular: No hyperdense vessel.  Atherosclerotic calcifications Skull: No calvarial fracture or focal suspicious osseous lesion. Hyperostosis frontalis interna, predominantly on the left. Sinuses/Orbits: Visualized orbits show no acute finding. Mild bilateral ethmoid sinus mucosal thickening. Trace mucosal thickening within the bilateral sphenoid sinuses. Small bilateral maxillary sinus mucous retention cysts. IMPRESSION: No evidence of acute intracranial abnormality. Mild paranasal sinus disease, as described. Electronically Signed   By: Jackey Loge DO   On: 12/07/2020 13:05   DG Chest Port 1 View  Result Date: 12/07/2020 CLINICAL DATA:  Altered level of consciousness. EXAM: PORTABLE CHEST 1 VIEW COMPARISON:  None. FINDINGS: The heart size and mediastinal contours are within normal limits. Both lungs are clear. The visualized skeletal structures are unremarkable. IMPRESSION: No active disease. Electronically Signed   By: Marlan Palau M.D.   On: 12/07/2020 12:19    Procedures Procedures   Medications Ordered in ED Medications  sodium chloride 0.9 % bolus 1,000 mL (1,000 mLs Intravenous New Bag/Given 12/07/20 1216)    And  0.9 %  sodium chloride infusion ( Intravenous New Bag/Given 12/07/20 1330)    ED Course  I have reviewed the triage vital signs and the nursing notes.  Pertinent labs & imaging results that were available during my care of the patient were reviewed by me and considered in my medical decision making (see chart for details).  Cardiac 60 sinus normal Pulse ox 100% room air normal EKG rhythm strip without substantial abnormalities   1:53 PM Patient in similar condition, appears uncomfortable, will in no distress, vital signs remain unremarkable, 160/80. He  has no accompanied by his wife.  On we discussed his history, including 1 prior similar event several years ago, which he had evaluation with 2 hospitalizations without clear diagnosis.  She notes that he had been otherwise well until recently.  According to her, but in slight contrast to report of the patient's son patient has been weak over the past day, after initially complaining of some back pain which is not currently present.  Patient currently denies any pain.  I discussed this case with our neurology colleagues, MR head, C-spine ordered, neuro consult with following results.   MDM Rules/Calculators/A&P Adult male, generally well, takes no medication regularly presents with weakness.  Weakness is global, without true disorientation, with slowness of interaction.  Patient's not overtly infectious with no leukocytosis, no fever, no focal source.  Description of 1 prior similar event suggests neurologic versus conversion etiology.  After discussion with our neurology colleagues, patient will have MRI, will require evaluation.  Dr. Deretha Emory aware Final Clinical Impression(s) / ED Diagnoses Final diagnoses:  Weakness     Gerhard Munch, MD 12/07/20 1559

## 2020-12-07 NOTE — ED Notes (Signed)
Non-violent restraints discussed with patient and family for arm comfort and requested.

## 2020-12-07 NOTE — ED Notes (Signed)
Bladder scan showed <995 mL

## 2020-12-07 NOTE — ED Notes (Signed)
Pt to MRI at this time.

## 2020-12-07 NOTE — ED Notes (Signed)
Placed primofit on pt to obtain urine sample due to pt being unable to hold urinal.

## 2020-12-07 NOTE — ED Notes (Addendum)
Patient A&Ox4, slow rate of speech and slight delay in responses. Patient is severely globally weak in all extremities to the extent of immobility. Patient reports discomfort in neck with stiffness and positional pain in arms. Patient arms repeatedly, involuntarily  contract uperward and causing pain and discomfort. Patient asking for repositioning for comfort. Resistance is noted when straightening arms.

## 2020-12-07 NOTE — ED Notes (Signed)
Non-violent restraints removed for repositioning and patient comfort.

## 2020-12-07 NOTE — ED Notes (Signed)
Pt seen by EDP upon arrival, orders received and initiated. BIB [facility] from home. Here for general profound weakness. Slow responses, fatigue and lethargy. Some rigidness.  Son noted more weakness and slower than norm ~ 2 weeks ago, but significant change from yesterday to today. Usually very active and worked in yard yesterday. Wife to follow. Extremeties cool.

## 2020-12-07 NOTE — ED Notes (Addendum)
Dr. Jeraldine Loots at [facility] speaking with pt and spouse.

## 2020-12-07 NOTE — ED Provider Notes (Addendum)
Patient is MRI shows evidence of an acute infarct which will require admission to hospitalist service.  Also spoke with Dr. Amada Jupiter about the MRI of the cervical spine.  And he is going to come see the patient and sort things out a little bit.  Also got information that patient was fine until 3:30 in the morning.  Patient was out walking yesterday.   Vanetta Mulders, MD 12/07/20 2028  According to Dr. 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   Vanetta Mulders, MD 12/07/20 2117

## 2020-12-07 NOTE — ED Notes (Signed)
Pt repositioned, pure wick in place, given ativan for spasms, wife at [facility], [facility].

## 2020-12-07 NOTE — ED Notes (Signed)
In and out cath complete, [name] NT present during cath. output. Absence of sensation noted, patient reported that he did not have abdominal discomfort before, was not able to tell a catheter was being placed, nor feel relief post bladder emptying. Alinda Money MD notified.

## 2020-12-07 NOTE — H&P (Addendum)
History and Physical   [name] WUJ:[id number] DOB: 1945/12/19 DOA: 12/07/2020  PCP: Pcp, No  Patient coming from: Home  Chief Complaint: Weakness  HPI: [name] is a 75 y.o. male with no known past medical history presenting with weakness. Patient has some slowed speech history obtained with assistance of chart review and family.  There appears to be some mildly conflicting reports of how he has been doing over the past week.  Per chart review, son previously reported he had had some decrease in his activity over the past week.  And he was able to do yard work yesterday.  But significant global weakness today. His wife is here now who reported that he seemed to be completely fine until around 330 this morning.  Confirms that he is globally weak today unable to stand. He reportedly had somewhat similar episode 2 years ago but never had a clear explanation per chart review.  He confirms that when it started this morning he had some pain across his neck and into his clavicles and then his weakness up into his arms. Denies fevers, chills, chest pain, shortness of breath, abdominal pain, constipation, diarrhea, nausea, vomiting.  ED Course: Vital signs in the ED are stable.  Lab work-up showed CMP with glucose 124, protein 6.1, T bili 2.9.  CBC showing platelets 103.  PT INR showing PT of 220.5 and INR of 1.8.  CK normal.  Respiratory panel for flu COVID-negative.  Urinalysis and UDS pending.  Ethanol level negative.  Imaging studies showed chest x-ray without acute abnormalities.  CT head without acute abnormalities.  MR brain with a 6 mm acute infarct.  MRI of the C-spine showing a T1 lesion/fracture suspicious for metastatic disease or other neoplasm.  Also noted an abnormal T2 signal concerning for possible myelitis of some etiology.  Also showing evidence of degenerative disc disease with foraminal stenosis.  Patient received a liter bolus in the ED as well as a dose of Ativan.  Neurology has  been consulted.  Review of Systems: As per HPI otherwise all other systems reviewed and are negative.  History reviewed. No pertinent past medical history.  History reviewed. No pertinent surgical history.  Social History  has no history on file for tobacco use, alcohol use, and drug use.  No Known Allergies  Family History  Problem Relation Age of Onset   Dementia Mother    Heart attack Father   Reviewed on admission   Prior to Admission medications   Medication Sig Start Date End Date Taking? Authorizing Provider  acetaminophen (TYLENOL) 500 MG tablet Take 1,000 mg by mouth every 6 (six) hours as needed (pain).   Yes [provider]  Ascorbic Acid (VITAMIN C PO) Take 1 tablet by mouth daily.   Yes [provider]  Cholecalciferol (VITAMIN D3 PO) Take 1 tablet by mouth daily.   Yes [provider]  Cyanocobalamin (VITAMIN B-12 PO) Take 1 drop by mouth daily.   Yes [provider]  Multiple Vitamin (MULTIVITAMIN WITH MINERALS) TABS tablet Take 1 tablet by mouth daily.   Yes [provider]    Physical Exam: Vitals:   12/07/20 1615 12/07/20 1812 12/07/20 1900 12/07/20 2000  BP: (!) 150/83 139/72 139/69 (!) 144/79  Pulse: (!) 57 (!) 56 (!) 56 60  Resp:  11  12  Temp:  98.3 F (36.8 C)    TempSrc:  Oral    SpO2: 98% 97% 97% 96%   Physical Exam Constitutional:  General: He is not in acute distress.    Appearance: Normal appearance.  HENT:     Head: Normocephalic and atraumatic.     Mouth/Throat:     Mouth: Mucous membranes are moist.     Pharynx: Oropharynx is clear.  Eyes:     Extraocular Movements: Extraocular movements intact.     Pupils: Pupils are equal, round, and reactive to light.  Cardiovascular:     Rate and Rhythm: Regular rhythm. Bradycardia present.     Pulses: Normal pulses.     Heart sounds: Normal heart sounds.  Pulmonary:     Effort: Pulmonary effort is normal. No respiratory distress.     Breath  sounds: Normal breath sounds.  Abdominal:     General: Bowel sounds are normal. There is no distension.     Palpations: Abdomen is soft.     Tenderness: There is no abdominal tenderness.  Musculoskeletal:        General: No swelling or deformity.  Skin:    General: Skin is warm and dry.  Neurological:     Comments: Mental Status: Patient is awake, alert, oriented x3 No signs of aphasia or neglect, slow speech Cranial Nerves: II: Pupils equal, round, and reactive to light.   III,IV, VI: EOMI without ptosis or diploplia.  V: Facial sensation is symmetric to light touch. VII: Facial movement is symmetric.  VIII: hearing is intact to voice X: Uvula elevates symmetrically XI: Shoulder shrug is symmetric. XII: tongue is midline without atrophy or fasciculations.  Motor: Good effort thorughout, at Least 2/5 bilateral UE, 0-1/5 bilateral lower extremitiy  Sensory: Sensation is grossly intact bilateral UEs & LEs      Labs on Admission: I have personally reviewed following labs and imaging studies  CBC: Recent Labs  Lab 12/07/20 1207  WBC 8.8  NEUTROABS 7.7  HGB 15.2  HCT 43.1  MCV 90.4  PLT 103*    Basic Metabolic Panel: Recent Labs  Lab 12/07/20 1207  NA 135  K 4.1  CL 102  CO2 23  GLUCOSE 124*  BUN 13  CREATININE 1.00  CALCIUM 9.6    GFR: CrCl cannot be calculated (Unknown ideal weight.).  Liver Function Tests: Recent Labs  Lab 12/07/20 1207  AST 30  ALT 13  ALKPHOS 53  BILITOT 2.9*  PROT 6.1*  ALBUMIN 4.0    Urine analysis: No results found for: COLORURINE, APPEARANCEUR, LABSPEC, PHURINE, GLUCOSEU, HGBUR, BILIRUBINUR, KETONESUR, PROTEINUR, UROBILINOGEN, NITRITE, LEUKOCYTESUR  Radiological Exams on Admission: CT HEAD WO CONTRAST  Result Date: 12/07/2020 CLINICAL DATA:  Mental status change, unknown cause. Altered mental status. EXAM: CT HEAD WITHOUT CONTRAST TECHNIQUE: Contiguous axial images were obtained from the base of the skull through the  vertex without intravenous contrast. COMPARISON:  No pertinent prior exams available for comparison. FINDINGS: Brain: Cerebral volume is normal. There is no acute intracranial hemorrhage. No demarcated cortical infarct. No extra-axial fluid collection. No evidence of an intracranial mass. No midline shift. Vascular: No hyperdense vessel.  Atherosclerotic calcifications Skull: No calvarial fracture or focal suspicious osseous lesion. Hyperostosis frontalis interna, predominantly on the left. Sinuses/Orbits: Visualized orbits show no acute finding. Mild bilateral ethmoid sinus mucosal thickening. Trace mucosal thickening within the bilateral sphenoid sinuses. Small bilateral maxillary sinus mucous retention cysts. IMPRESSION: No evidence of acute intracranial abnormality. Mild paranasal sinus disease, as described. Electronically Signed   By: Jackey Loge DO   On: 12/07/2020 13:05   MR Brain W and Wo Contrast  Result Date: 12/07/2020 CLINICAL  DATA:  Neuro deficit, acute, stroke suspected.  Weakness. EXAM: MRI HEAD WITHOUT AND WITH CONTRAST TECHNIQUE: Multiplanar, multiecho pulse sequences of the brain and surrounding structures were obtained without and with intravenous contrast. CONTRAST:  7mL GADAVIST GADOBUTROL 1 MMOL/ML IV SOLN COMPARISON:  Head CTA 522 FINDINGS: Brain: There is a 6 mm acute cortical/subcortical infarct in the left perirolandic region. Scattered small T2 hyperintensities in the cerebral white matter bilaterally are nonspecific but compatible with mild chronic small vessel ischemic disease. The ventricles and sulci are within normal limits for age. No intracranial hemorrhage, mass, midline shift, extra-axial fluid collection, or abnormal enhancement is identified. Vascular: Major intracranial vascular flow voids are preserved. Skull and upper cervical spine: Unremarkable bone marrow signal. Asymmetric left frontal hyperostosis interna. Sinuses/Orbits: Unremarkable orbits. Unremarkable orbits.  Mild bilateral ethmoid air cell mucosal thickening. Other: None. IMPRESSION: 1. 6 mm acute left perirolandic infarct. 2. Mild chronic small vessel ischemic disease. Electronically Signed   By: Sebastian Ache M.D.   On: 12/07/2020 18:14   MR Cervical Spine W or Wo Contrast  Result Date: 12/07/2020 CLINICAL DATA:  Demyelinating disease.  Weakness. EXAM: MRI CERVICAL SPINE WITHOUT AND WITH CONTRAST TECHNIQUE: Multiplanar and multiecho pulse sequences of the cervical spine, to include the craniocervical junction and cervicothoracic junction, were obtained without and with intravenous contrast. CONTRAST:  7mL GADAVIST GADOBUTROL 1 MMOL/ML IV SOLN COMPARISON:  None. FINDINGS: The study is motion degraded including severe motion on the axial T2 gradient echo sequence. Alignment: Reversal of the normal cervical lordosis. Trace anterolisthesis of C4 on C5 and C7 on T1. Vertebrae: Abnormal marked T1 hypointensity, STIR hyperintensity, and mild enhancement involving the marrow throughout the majority of the T1 vertebral body and extending into the right-sided posterior elements with an associated fracture involving the superior endplate and posterior vertebral body cortex resulting in focal 40% vertebral body height loss and 3 mm retropulsion of the posterior vertebral body cortex. 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 Abnormal signal hypointensity posteriorly in the C6 and C7 vertebral bodies on the sagittal T1 postcontrast sequence without a corresponding abnormality on the other sequences to confirm an underlying lesion. Degenerative endplate changes at C5-6 and C6-7. Cord: Mild T2 hyperintensity involving the central and ventral spinal cord bilaterally from C5 into the included upper thoracic spine without enhancement. Posterior Fossa, vertebral arteries, paraspinal tissues: Unremarkable. Disc levels: C2-3: Severe right facet arthrosis result in mild-to-moderate right neural  foraminal stenosis without spinal stenosis. C3-4: Moderate facet arthrosis results in mild right neural foraminal stenosis without spinal stenosis. C4-5: Anterolisthesis with bulging uncovered disc, uncovertebral spurring, and moderate right facet arthrosis result in mild spinal stenosis and moderate right neural foraminal stenosis. C5-6: Mild-to-moderate disc space narrowing. Disc bulging and uncovertebral spurring result in moderate right and severe left neural foraminal stenosis without significant spinal stenosis. C6-7: Mild-to-moderate disc space narrowing. Disc bulging, a small central disc protrusion, and uncovertebral spurring result in borderline spinal stenosis and severe right and moderate to severe left neural foraminal stenosis. C7-T1: Anterolisthesis with disc uncovering, infolding of the ligamentum flavum, and severe facet arthrosis result in mild-to-moderate spinal stenosis and severe bilateral neural foraminal stenosis. IMPRESSION: 1. Lesion involving the T1 vertebral body and right-sided posterior elements with associated pathologic vertebral fracture concerning for metastatic disease or other neoplasm. 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 2. Abnormal T2 hyperintensity involving the central and ventral aspects of the spinal cord from C5 into the included upper thoracic spine.  Considerations include cord infarct, transverse myelitis, viral myelitis, and other inflammatory processes such as sarcoidosis. No enhancement or masslike finding to suggest intramedullary cord tumor although thoracic spine imaging is recommended for further evaluation. 3. Disc and facet degeneration resulting in severe multilevel neural foraminal stenosis as above. 4. Mild-to-moderate spinal stenosis at C7-T1. Electronically Signed   By: Sebastian Ache M.D.   On: 12/07/2020 18:45   DG Chest Port 1 View  Result Date: 12/07/2020 CLINICAL DATA:  Altered level of consciousness. EXAM: PORTABLE CHEST 1 VIEW COMPARISON:   None. FINDINGS: The heart size and mediastinal contours are within normal limits. Both lungs are clear. The visualized skeletal structures are unremarkable. IMPRESSION: No active disease. Electronically Signed   By: Marlan Palau M.D.   On: 12/07/2020 12:19    EKG: Independently reviewed.  Sinus rhythm at 63 bpm.  No prior to compare.  Assessment/Plan Principal Problem:   Acute CVA (cerebrovascular accident) [facility]) Active Problems:   Abnormal MRI   Sinus bradycardia seen on cardiac monitor   Acute CVA  Spinal stroke (?versus myelitis)  > Global weakness.  Unusual presentation.  In addition to CVA may also be spinal stroke.  > MR brain did show acute CVA.  Neurology has ordered diffusion-weighted MRI of the spine to confirm suspected spinal stroke.  Believe myelitis less likely.  > Given multiple locations of stroke, cardioembolic source is suspected.  - Appreciate neurology recommendations  - Allow for permissive HTN  (systolic < 220 and diastolic < 120)  - ASA 325 mg / 81 mg daily   - Statin when/if able to swallow   - Echocardiogram   - Carotid doppler  - A1C   - Lipid panel   - Tele monitoring   - SLP eval  - PT/OT - As needed bladder scans and in and out caths  Abnormal Cervical MRI findings > T1 lesion suspicious for possible mets or other neoplasm. > We will wait and confirm lesions characteristics with planned diffusion cervical MRI. -If continues to be suspicious for metastatic lesion, will pursue CT of the chest abdomen and pelvis to evaluate for possible primary. ADDENDUM > Has yet to receive repeat MRI, discussed with neurology again who stated repeat MRI unlikely to change initial read.  We will proceed with CT of the chest abdomen pelvis to evaluate for possible malignancy.  Bradycardia > Sinus bradycardia on EKG and monitors.  Does not appear to be symptomatic from this.  50s to 60s in ED. - Continue to monitor  DVT prophylaxis: SCDs for now.  May need a lumbar  puncture, platelets 103, INR elevated 1.8.  Code Status:   DNR, DNI Family Communication:  Wife updated at bedside Disposition Plan:   Patient is from:  Home  Anticipated DC to:  Pending clinical course  Anticipated DC date:  Pending clinical course  Anticipated DC barriers: Current weakness  Consults called:  Neurology, Dr. Amada Jupiter, consulted by EDP  Admission status:  Observation, telemetry   Severity of Illness: The appropriate patient status for this patient is OBSERVATION. Observation status is judged to be reasonable and necessary in order to provide the required intensity of service to ensure the patient's safety. The patient's presenting symptoms, physical exam findings, and initial radiographic and laboratory data in the context of their medical condition is felt to place them at decreased risk for further clinical deterioration. Furthermore, it is anticipated that the patient will be medically stable for discharge from the hospital within 2 midnights of admission.  The following factors support the patient status of observation.   " The patient's presenting symptoms include weakness. " The physical exam findings include diffuse weakness worse in lower extremities and upper extremities.  Slow speech. " The initial radiographic and laboratory data are  Lab work-up showed CMP with glucose 124, protein 6.1, T bili 2.9.  CBC showing platelets 103.  PT INR showing PT of 220.5 and INR of 1.8.  CK normal.  Respiratory panel for flu and COVID negative.  Urinalysis and UDS pending.  Ethanol level negative.  Imaging studies showed chest x-ray without acute abnormalities.  CT head without acute abnormalities.  MR brain with a 6 mm acute infarct.  MRI of the C-spine showing a T1 lesion/fracture suspicious for metastatic disease or other neoplasm.  Also noted an abnormal T2 signal concerning for possible myelitis of some etiology.  Also showing evidence of degenerative disc disease with foraminal  stenosis.   Synetta Fail MD [facility]  How to contact the [facility] Attending or Consulting provider 7A - 7P or covering provider during after hours 7P -7A, for this patient?   Check the care team in [facility] and look for a) attending/consulting [facility] provider listed and b) the [facility] team listed Log into [email or web address] and use [place] universal password to access. If you do not have the password, please contact the hospital operator. Locate the [facility] provider you are looking for under [facility] and page to a number that you can be directly reached. If you still have difficulty reaching the provider, please page the [facility] (Director on Call) for the Hospitalists listed on amion for assistance.  12/07/2020, 9:36 PM

## 2020-12-07 NOTE — ED Notes (Signed)
Back from MRI, alert, NAD, calm, no changes, remains lethargic and generally weak. Pending results.

## 2020-12-07 NOTE — ED Triage Notes (Signed)
Xray at BS at this time.

## 2020-12-08 ENCOUNTER — Observation Stay (HOSPITAL_COMMUNITY): Payer: Medicare Other

## 2020-12-08 ENCOUNTER — Other Ambulatory Visit (HOSPITAL_COMMUNITY): Payer: Federal, State, Local not specified - PPO

## 2020-12-08 ENCOUNTER — Encounter (HOSPITAL_COMMUNITY): Payer: Self-pay | Admitting: Internal Medicine

## 2020-12-08 ENCOUNTER — Other Ambulatory Visit: Payer: Self-pay

## 2020-12-08 DIAGNOSIS — I6340 Cerebral infarction due to embolism of unspecified cerebral artery: Secondary | ICD-10-CM | POA: Diagnosis not present

## 2020-12-08 DIAGNOSIS — E8770 Fluid overload, unspecified: Secondary | ICD-10-CM | POA: Diagnosis present

## 2020-12-08 DIAGNOSIS — G9511 Acute infarction of spinal cord (embolic) (nonembolic): Secondary | ICD-10-CM | POA: Diagnosis not present

## 2020-12-08 DIAGNOSIS — Z832 Family history of diseases of the blood and blood-forming organs and certain disorders involving the immune mechanism: Secondary | ICD-10-CM | POA: Diagnosis not present

## 2020-12-08 DIAGNOSIS — D65 Disseminated intravascular coagulation [defibrination syndrome]: Secondary | ICD-10-CM | POA: Diagnosis present

## 2020-12-08 DIAGNOSIS — R338 Other retention of urine: Secondary | ICD-10-CM | POA: Diagnosis present

## 2020-12-08 DIAGNOSIS — Z66 Do not resuscitate: Secondary | ICD-10-CM | POA: Diagnosis present

## 2020-12-08 DIAGNOSIS — D696 Thrombocytopenia, unspecified: Secondary | ICD-10-CM | POA: Diagnosis present

## 2020-12-08 DIAGNOSIS — I639 Cerebral infarction, unspecified: Secondary | ICD-10-CM | POA: Diagnosis present

## 2020-12-08 DIAGNOSIS — Z7982 Long term (current) use of aspirin: Secondary | ICD-10-CM | POA: Diagnosis not present

## 2020-12-08 DIAGNOSIS — Z8249 Family history of ischemic heart disease and other diseases of the circulatory system: Secondary | ICD-10-CM | POA: Diagnosis not present

## 2020-12-08 DIAGNOSIS — Z20822 Contact with and (suspected) exposure to covid-19: Secondary | ICD-10-CM | POA: Diagnosis present

## 2020-12-08 DIAGNOSIS — R1310 Dysphagia, unspecified: Secondary | ICD-10-CM | POA: Diagnosis not present

## 2020-12-08 DIAGNOSIS — R29718 NIHSS score 18: Secondary | ICD-10-CM | POA: Diagnosis not present

## 2020-12-08 DIAGNOSIS — Z515 Encounter for palliative care: Secondary | ICD-10-CM | POA: Diagnosis not present

## 2020-12-08 DIAGNOSIS — R29716 NIHSS score 16: Secondary | ICD-10-CM | POA: Diagnosis not present

## 2020-12-08 DIAGNOSIS — Z7189 Other specified counseling: Secondary | ICD-10-CM | POA: Diagnosis not present

## 2020-12-08 DIAGNOSIS — M8458XD Pathological fracture in neoplastic disease, other specified site, subsequent encounter for fracture with routine healing: Secondary | ICD-10-CM | POA: Diagnosis present

## 2020-12-08 DIAGNOSIS — N390 Urinary tract infection, site not specified: Secondary | ICD-10-CM | POA: Diagnosis not present

## 2020-12-08 DIAGNOSIS — K592 Neurogenic bowel, not elsewhere classified: Secondary | ICD-10-CM | POA: Diagnosis not present

## 2020-12-08 DIAGNOSIS — R001 Bradycardia, unspecified: Secondary | ICD-10-CM | POA: Diagnosis present

## 2020-12-08 DIAGNOSIS — I951 Orthostatic hypotension: Secondary | ICD-10-CM | POA: Diagnosis present

## 2020-12-08 DIAGNOSIS — R29713 NIHSS score 13: Secondary | ICD-10-CM | POA: Diagnosis present

## 2020-12-08 DIAGNOSIS — M8448XA Pathological fracture, other site, initial encounter for fracture: Secondary | ICD-10-CM | POA: Diagnosis present

## 2020-12-08 DIAGNOSIS — R531 Weakness: Secondary | ICD-10-CM | POA: Diagnosis not present

## 2020-12-08 DIAGNOSIS — F32A Depression, unspecified: Secondary | ICD-10-CM | POA: Diagnosis present

## 2020-12-08 DIAGNOSIS — N401 Enlarged prostate with lower urinary tract symptoms: Secondary | ICD-10-CM | POA: Diagnosis present

## 2020-12-08 DIAGNOSIS — N5089 Other specified disorders of the male genital organs: Secondary | ICD-10-CM | POA: Diagnosis present

## 2020-12-08 DIAGNOSIS — R29711 NIHSS score 11: Secondary | ICD-10-CM | POA: Diagnosis not present

## 2020-12-08 DIAGNOSIS — G8250 Quadriplegia, unspecified: Secondary | ICD-10-CM | POA: Diagnosis not present

## 2020-12-08 DIAGNOSIS — R791 Abnormal coagulation profile: Secondary | ICD-10-CM | POA: Diagnosis present

## 2020-12-08 DIAGNOSIS — N400 Enlarged prostate without lower urinary tract symptoms: Secondary | ICD-10-CM | POA: Diagnosis present

## 2020-12-08 DIAGNOSIS — Z8673 Personal history of transient ischemic attack (TIA), and cerebral infarction without residual deficits: Secondary | ICD-10-CM | POA: Diagnosis not present

## 2020-12-08 DIAGNOSIS — Z79899 Other long term (current) drug therapy: Secondary | ICD-10-CM | POA: Diagnosis not present

## 2020-12-08 DIAGNOSIS — I6389 Other cerebral infarction: Secondary | ICD-10-CM | POA: Diagnosis not present

## 2020-12-08 DIAGNOSIS — R471 Dysarthria and anarthria: Secondary | ICD-10-CM | POA: Diagnosis present

## 2020-12-08 DIAGNOSIS — G4700 Insomnia, unspecified: Secondary | ICD-10-CM | POA: Diagnosis present

## 2020-12-08 DIAGNOSIS — G904 Autonomic dysreflexia: Secondary | ICD-10-CM | POA: Diagnosis present

## 2020-12-08 DIAGNOSIS — R1312 Dysphagia, oropharyngeal phase: Secondary | ICD-10-CM | POA: Diagnosis not present

## 2020-12-08 DIAGNOSIS — B9620 Unspecified Escherichia coli [E. coli] as the cause of diseases classified elsewhere: Secondary | ICD-10-CM | POA: Diagnosis present

## 2020-12-08 DIAGNOSIS — I10 Essential (primary) hypertension: Secondary | ICD-10-CM | POA: Diagnosis present

## 2020-12-08 DIAGNOSIS — E7800 Pure hypercholesterolemia, unspecified: Secondary | ICD-10-CM | POA: Diagnosis not present

## 2020-12-08 DIAGNOSIS — M899 Disorder of bone, unspecified: Secondary | ICD-10-CM

## 2020-12-08 DIAGNOSIS — G959 Disease of spinal cord, unspecified: Secondary | ICD-10-CM | POA: Diagnosis present

## 2020-12-08 DIAGNOSIS — N319 Neuromuscular dysfunction of bladder, unspecified: Secondary | ICD-10-CM | POA: Diagnosis not present

## 2020-12-08 DIAGNOSIS — R0989 Other specified symptoms and signs involving the circulatory and respiratory systems: Secondary | ICD-10-CM | POA: Diagnosis present

## 2020-12-08 DIAGNOSIS — R9389 Abnormal findings on diagnostic imaging of other specified body structures: Secondary | ICD-10-CM | POA: Diagnosis not present

## 2020-12-08 DIAGNOSIS — Z809 Family history of malignant neoplasm, unspecified: Secondary | ICD-10-CM | POA: Diagnosis not present

## 2020-12-08 LAB — DIC (DISSEMINATED INTRAVASCULAR COAGULATION)PANEL
D-Dimer, Quant: >20 ug/mL-FEU — ABNORMAL HIGH (ref 0.00–0.50)
Fibrinogen: 164 mg/dL — ABNORMAL LOW (ref 210–475)
INR: 1.2 (ref 0.8–1.2)
Platelets: 75 10*3/uL — ABNORMAL LOW (ref 150–400)
Prothrombin Time: 15.2 seconds (ref 11.4–15.2)
aPTT: 25 seconds (ref 24–36)

## 2020-12-08 LAB — CBC
HCT: 38.6 % — ABNORMAL LOW (ref 39.0–52.0)
Hemoglobin: 13.5 g/dL (ref 13.0–17.0)
MCH: 32.3 pg (ref 26.0–34.0)
MCHC: 35 g/dL (ref 30.0–36.0)
MCV: 92.3 fL (ref 80.0–100.0)
Platelets: 81 10*3/uL — ABNORMAL LOW (ref 150–400)
RBC: 4.18 MIL/uL — ABNORMAL LOW (ref 4.22–5.81)
RDW: 13.2 % (ref 11.5–15.5)
WBC: 8.3 10*3/uL (ref 4.0–10.5)
nRBC: 0 % (ref 0.0–0.2)

## 2020-12-08 LAB — PROTIME-INR
INR: 1.3 — ABNORMAL HIGH (ref 0.8–1.2)
Prothrombin Time: 15.7 seconds — ABNORMAL HIGH (ref 11.4–15.2)

## 2020-12-08 LAB — LIPID PANEL
Cholesterol: 231 mg/dL — ABNORMAL HIGH (ref 0–200)
HDL: 38 mg/dL — ABNORMAL LOW (ref >40)
LDL Cholesterol: 180 mg/dL — ABNORMAL HIGH (ref 0–99)
Total CHOL/HDL Ratio: 6.1 RATIO
Triglycerides: 66 mg/dL (ref <150)
VLDL: 13 mg/dL (ref 0–40)

## 2020-12-08 LAB — APTT: aPTT: 27 seconds (ref 24–36)

## 2020-12-08 LAB — HEMOGLOBIN A1C
Hgb A1c MFr Bld: 5.5 % (ref 4.8–5.6)
Mean Plasma Glucose: 111.15 mg/dL

## 2020-12-08 IMAGING — MR MR THORACIC SPINE W/O CM
6 of 13 series · 19 of 48 positions shown · non-contrast
Comparison: MRI cervical spine [DATE]

CLINICAL DATA: Possible cord infarct.

EXAM:
MRI CERVICAL AND THORACIC SPINE WITHOUT CONTRAST
TECHNIQUE: Multiplanar and multiecho pulse sequences of the cervical spine, to
include the craniocervical junction and cervicothoracic junction,
and the thoracic spine, were obtained without intravenous contrast.
Diffusion-weighted imaging was performed.

[Series 3: T1 · sagittal · 3.0mm · 0.90mm/px · 2 of 15 slices shown (1 of 2)]
[im 1/15]
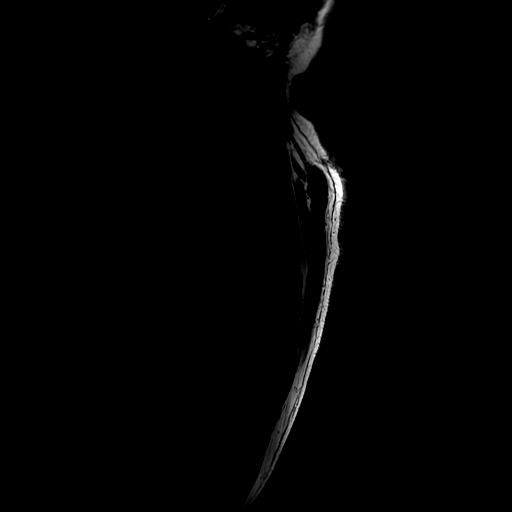
[im 15/15]
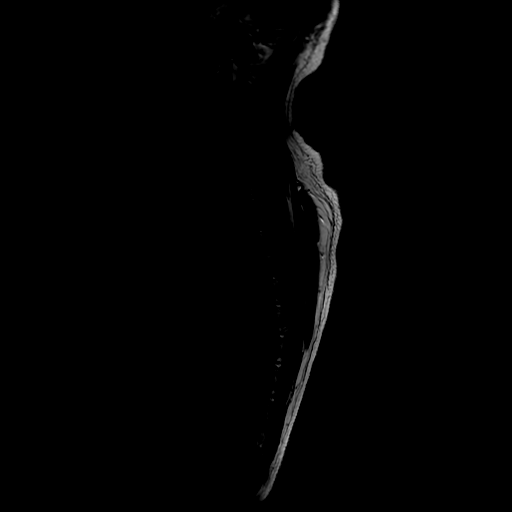

[Series 4: T2 · sagittal · 3.0mm · 0.66mm/px · 3 of 20 slices shown (1 of 4)]
[im 1/20]
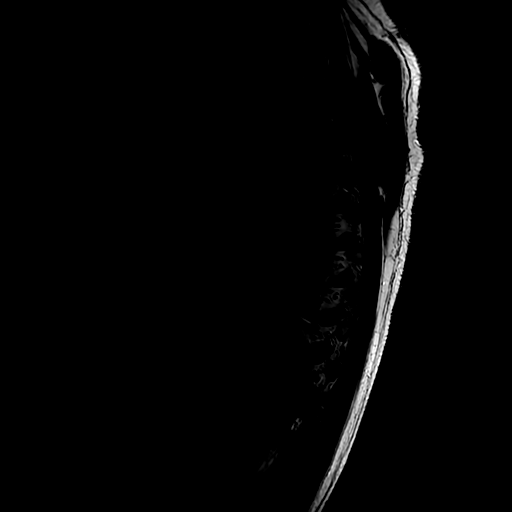
[im 10/20]
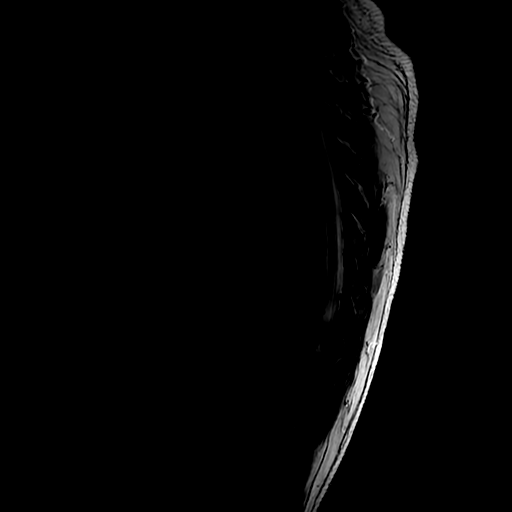
[im 20/20]
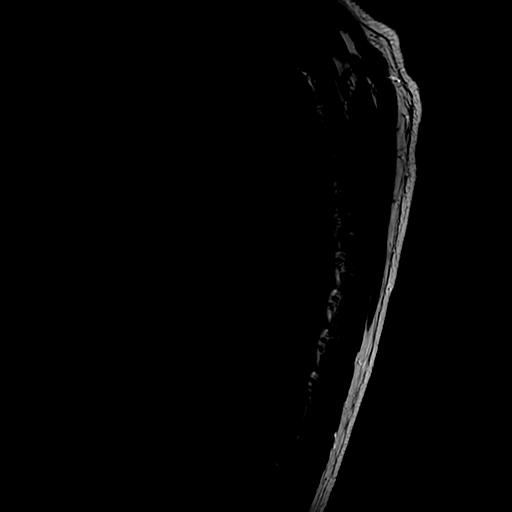

[Series 5: T2 · sagittal · 3.0mm · 0.66mm/px · 3 of 20 slices shown (2 of 4)]
[im 1/20]
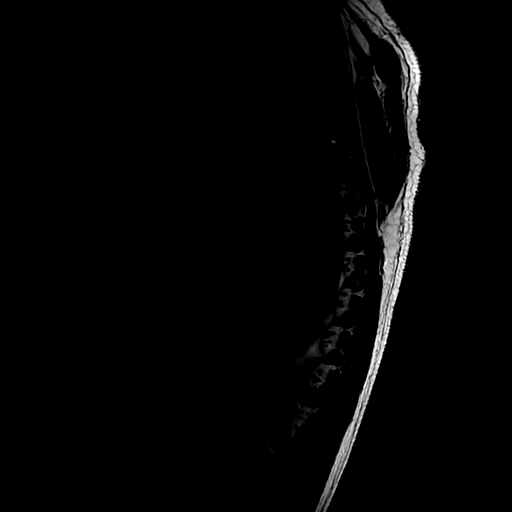
[im 10/20]
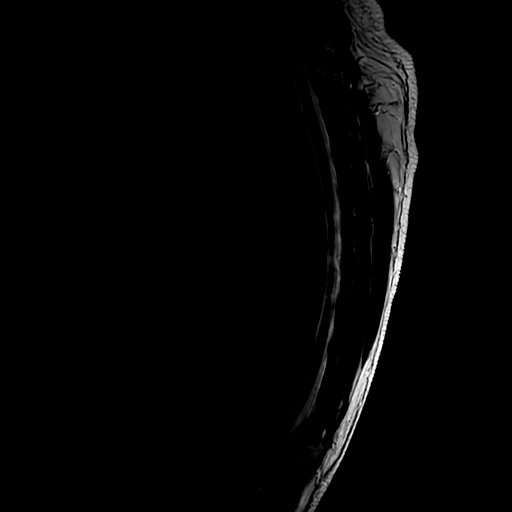
[im 20/20]
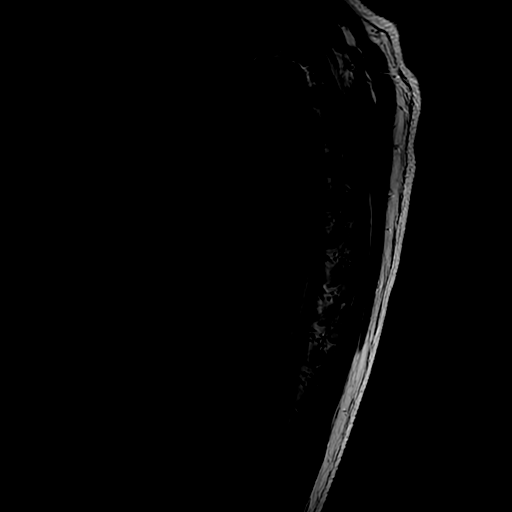

[Series 7: T1 · sagittal · 3.0mm · 0.66mm/px · 1 of 20 slices shown (2 of 2)]
[im 1/20]
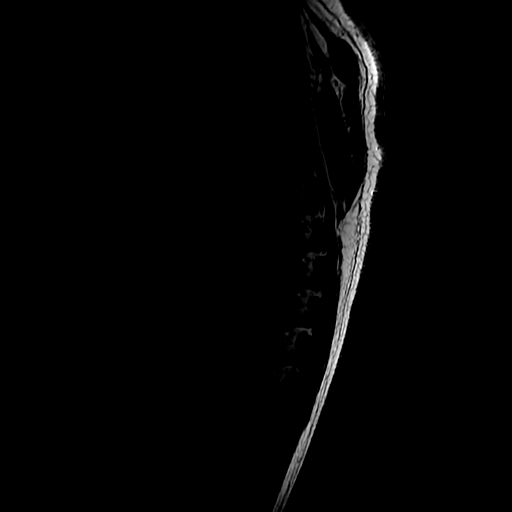

[Series 8: T2 · sagittal · 3.0mm · 0.66mm/px · 3 of 20 slices shown (3 of 4)]
[im 1/20]
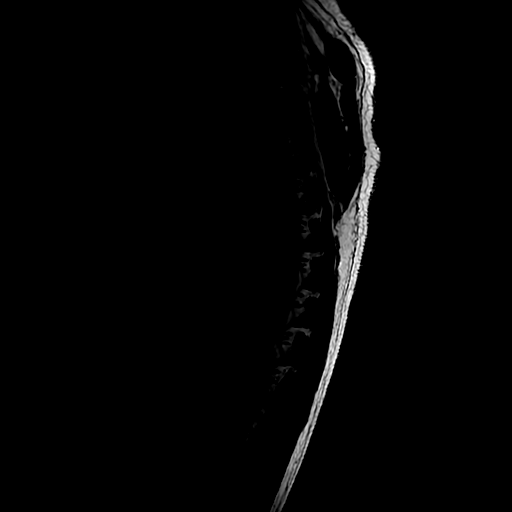
[im 10/20]
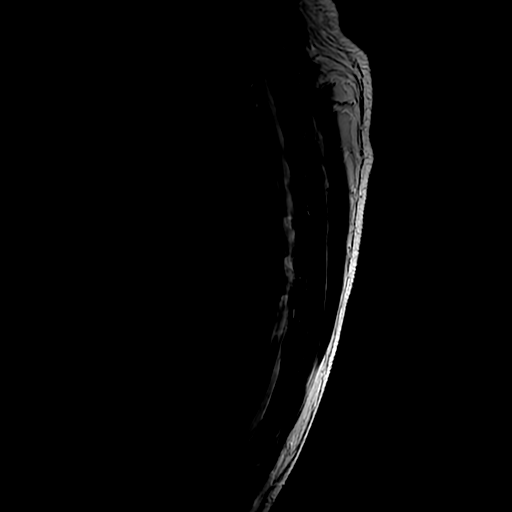
[im 20/20]
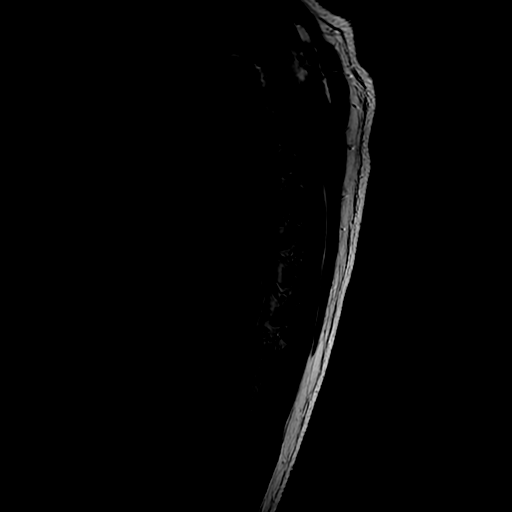

[Series 10: T2 · axial · 4.0mm · 0.39mm/px · z∈[-397,-95]mm · 7 of 58 slices shown (4 of 4)]
[im 1/58]
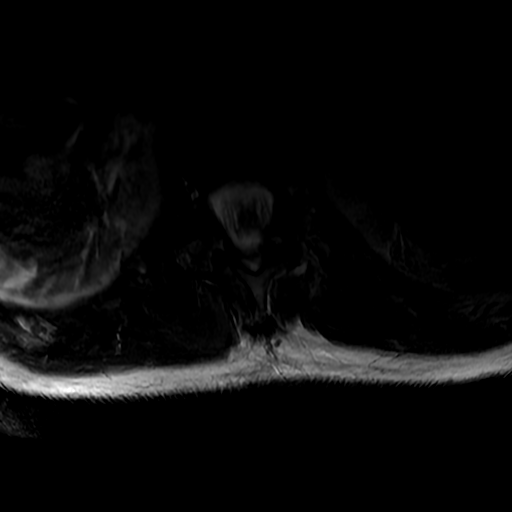
[im 10/58]
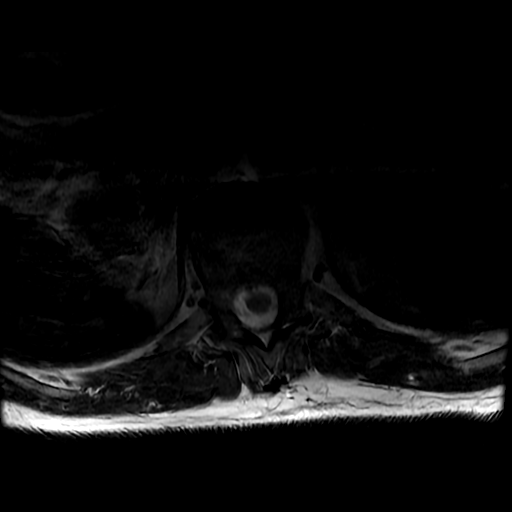
[im 20/58]
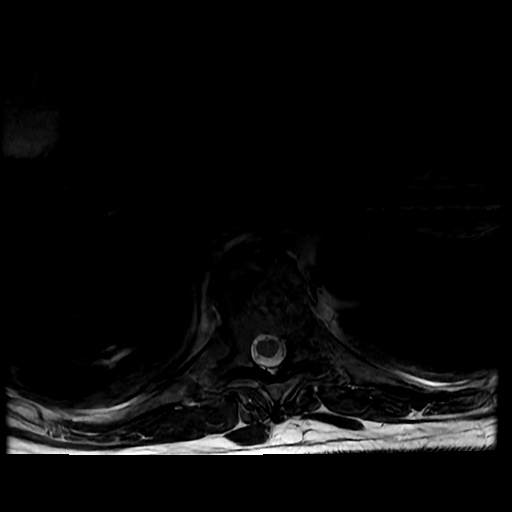
[im 29/58]
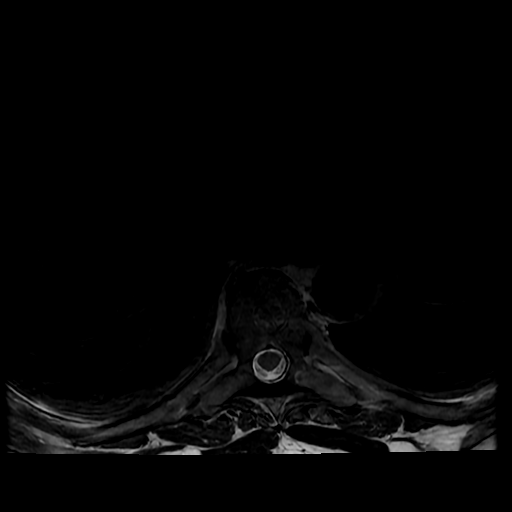
[im 39/58]
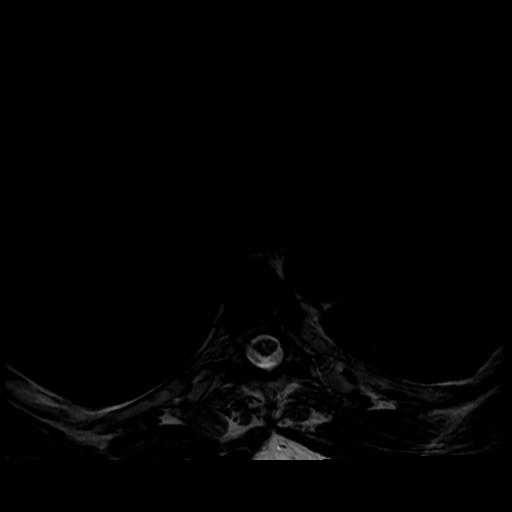
[im 48/58]
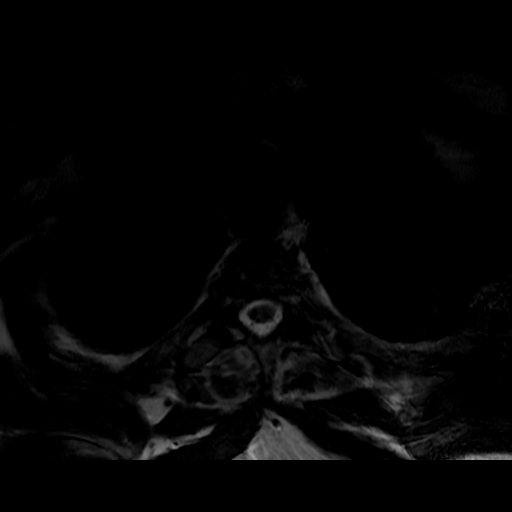
[im 58/58]
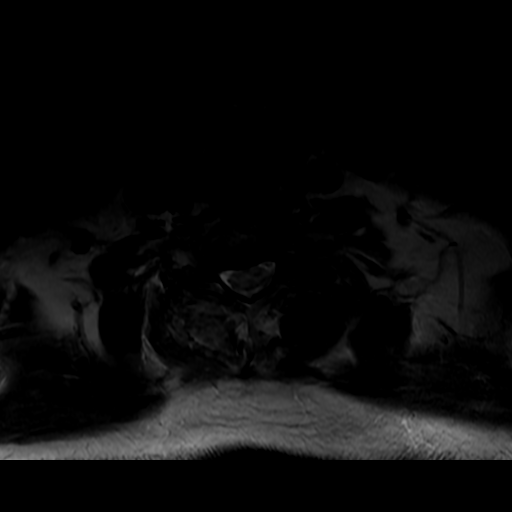

[19 of 48 positions shown; findings below may reference images not displayed]

FINDINGS: MRI CERVICAL SPINE FINDINGS

Findings of the cervical spine, including the long segment central
cord signal abnormality, are unchanged from the earlier cervical
spine MRI. Additionally, diffusion-weighted imaging was performed
however, the images are of limited diagnostic value.

MRI THORACIC SPINE FINDINGS

Alignment:  Physiologic.

Vertebrae: Unchanged possible pathologic fracture at T1.

Cord: Central cord signal abnormality terminates at the T1-2 level.
The remainder of the thoracic spinal cord is normal.

Paraspinal and other soft tissues: Negative.

Disc levels:

No spinal canal or neural foraminal stenosis.

Nondiagnostic diffusion-weighted imaging.
IMPRESSION: 1. Nondiagnostic attempted diffusion-weighted imaging of the
cervical and thoracic spine. Utilization of a more tailored spine
MRI protocol may be helpful if this can be made available.
2. Unchanged long segment central cervical spinal cord signal
abnormality, which terminates at the T1-2 level. No signal
abnormality within the remainder of the thoracic spinal cord.
3. Unchanged appearance of possible pathologic fracture at T1.

## 2020-12-08 IMAGING — CT CT ANGIO HEAD-NECK (W OR W/O PERF)
1 of 11 series · 5 of 33 positions shown · IV contrast (omnipaque)
Comparison: MRIs of the head, cervical spine, and thoracic spine
from earlier today and yesterday

CLINICAL DATA: Stroke, follow-up. Acute quadriparesis. Small acute
left perirolandic infarct on head MRI and suspected cervical and
upper thoracic spinal cord infarct on spine MRI.

EXAM:
CT ANGIOGRAPHY HEAD AND NECK
TECHNIQUE: Multidetector CT imaging of the head and neck was performed using
the standard protocol during bolus administration of intravenous
contrast. Multiplanar CT image reconstructions and MIPs were
obtained to evaluate the vascular anatomy. Carotid stenosis
measurements (when applicable) are obtained utilizing NASCET
criteria, using the distal internal carotid diameter as the
denominator.
CONTRAST:  75mL OMNIPAQUE IOHEXOL 350 MG/ML SOLN

[Series 11: cta neck axial · axial · 0.39mm/px · z∈[+1156,+1387]mm · 5 of 353 slices shown]
[im 59/353  soft-tissue]
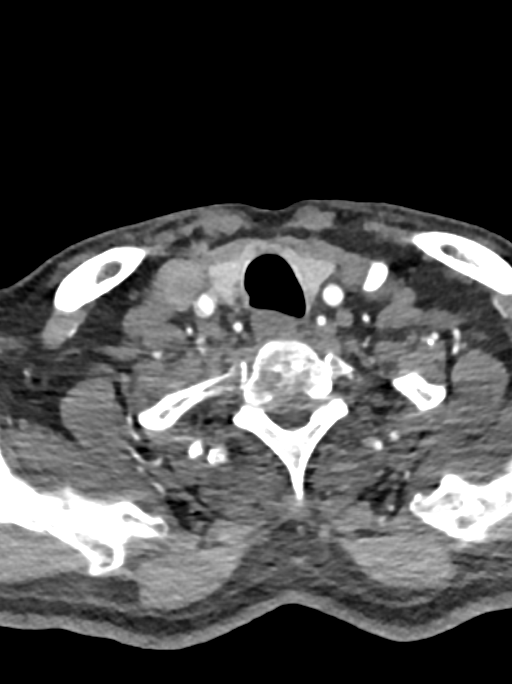
[im 118/353  bone]
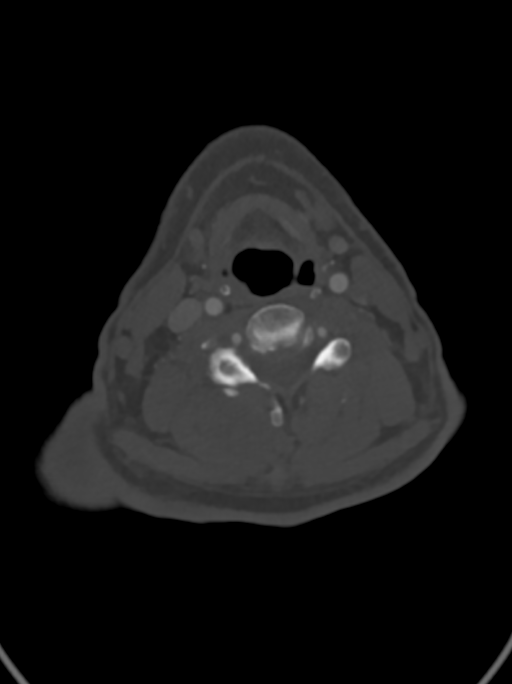
[im 177/353  soft-tissue]
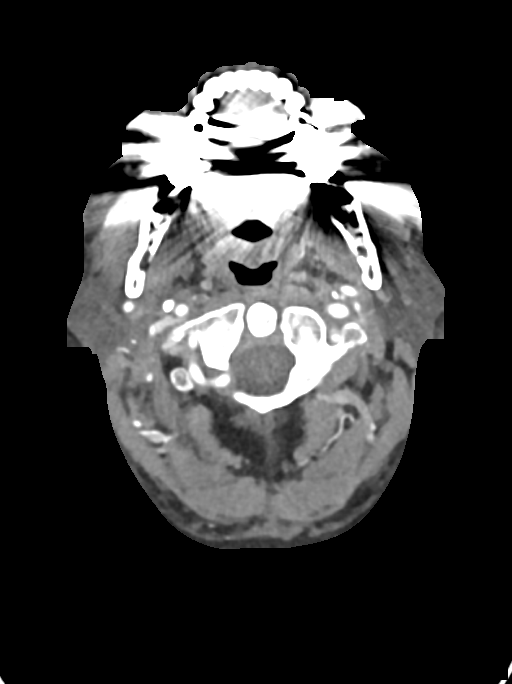
[im 235/353  bone]
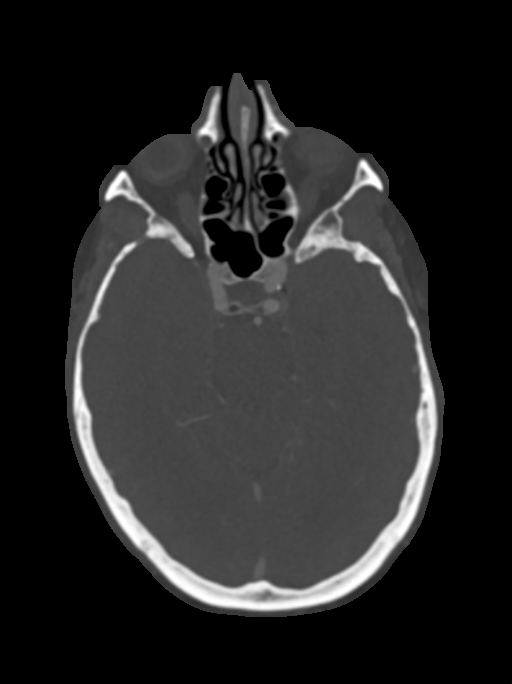
[im 294/353  soft-tissue]
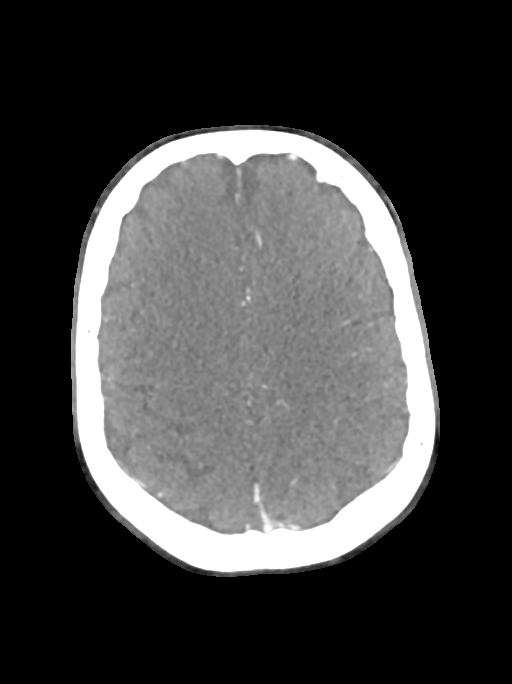

[5 of 33 positions shown; findings below may reference images not displayed]

FINDINGS: CT HEAD FINDINGS

Brain: The subcentimeter acute left perirolandic infarct was better
demonstrated on MRI. No new infarct, acute intracranial hemorrhage,
mass, midline shift, or extra-axial fluid collection is identified.
The ventricles and sulci are within normal limits for age.
Hypodensities in the cerebral white matter bilaterally are
nonspecific but compatible with mild chronic small vessel ischemic
disease.

Vascular: Calcified atherosclerosis at the skull base.

Skull: No fracture or suspicious osseous lesion.

Sinuses: Mild mucosal thickening in the paranasal sinuses. Clear
mastoid air cells.

Orbits: Unremarkable.

Review of the MIP images confirms the above findings

CTA NECK FINDINGS

Aortic arch: Normal variant aortic arch branching pattern with
common origin of the brachiocephalic and left common carotid
arteries and with the left vertebral artery arising directly from
the arch. Widely patent arch vessel origins.

Right carotid system: Patent without evidence of stenosis,
dissection, or significant atherosclerosis.

Left carotid system: Patent without evidence of stenosis,
dissection, or significant atherosclerosis.

Vertebral arteries: Patent and codominant without evidence of
stenosis, dissection, or significant atherosclerosis.

Skeleton: Lucency in the T1 vertebral body with posterior cortical
erosion and focal superior endplate fracture as seen on MRI.

Other neck: No evidence of cervical lymphadenopathy or mass.

Upper chest: Clear lung apices.

Review of the MIP images confirms the above findings

CTA HEAD FINDINGS

Anterior circulation: The internal carotid arteries are patent from
skull base to carotid termini with minimal nonstenotic plaque. ACAs
and MCAs are patent without evidence of a proximal branch occlusion
or significant proximal stenosis. The right A1 segment is aplastic.
No aneurysm is identified.

Posterior circulation: The intracranial vertebral arteries are
widely patent to the basilar. Patent left PICA, right AICA, and
bilateral SCA origins are visualized. The basilar artery is widely
patent. There are moderately large right and diminutive left
posterior communicating arteries with hypoplasia of the right P1
segment, and there are also large anterior choroidal arteries which
supply a portion of the PCA territory bilaterally. No significant
proximal PCA stenosis is evident. No aneurysm is identified.

Venous sinuses: Patent.

Anatomic variants: As above.

Review of the MIP images confirms the above findings
IMPRESSION: 1. Minimal atherosclerosis in the head and neck without large vessel
occlusion or significant proximal stenosis.
2. Lucency in the T1 vertebral body with posterior cortical erosion
and superior endplate fracture as seen on MRI. This remains more
concerning for neoplasm (metastatic disease, lymphoma, myeloma) as
opposed to a benign bone infarct.

## 2020-12-08 IMAGING — CT CT CHEST-ABD-PELV W/ CM
3 of 5 series · 14 of 36 positions shown, 16 images · IV contrast (Omni 300)
Comparison: None.

CLINICAL DATA: Thoracic spine osseous lesion. Assessment for
potential primary malignancy

EXAM:
CT CHEST, ABDOMEN, AND PELVIS WITH CONTRAST
TECHNIQUE: Multidetector CT imaging of the chest, abdomen and pelvis was
performed following the standard protocol during bolus
administration of intravenous contrast.
CONTRAST:  100mL OMNIPAQUE IOHEXOL 300 MG/ML  SOLN

[Series 3: cap with 5mm st · axial · 0.89mm/px · z∈[-664,-104]mm · 9 of 141 slices shown, 11 images]
[im 15/141  mediastinal]
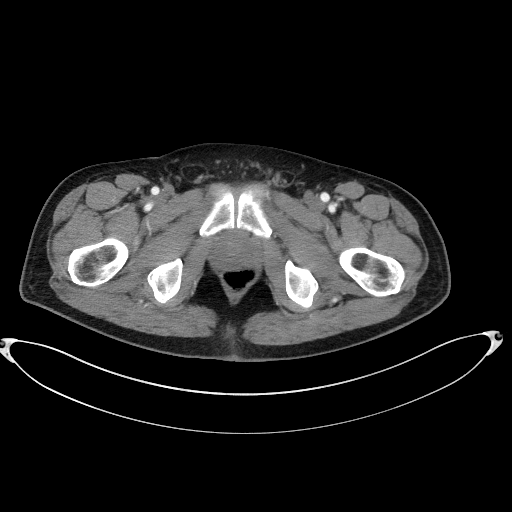
[im 15/141  bone]
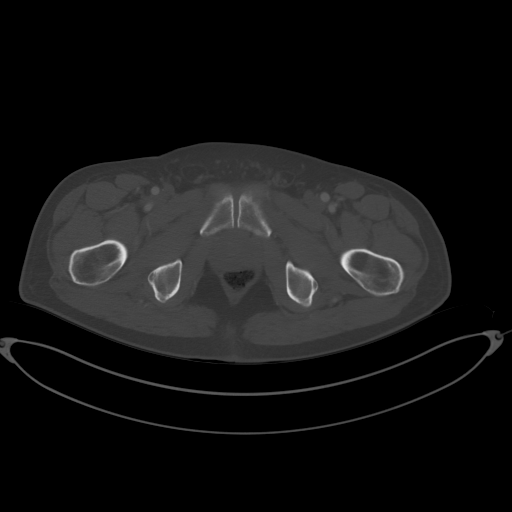
[im 29/141  mediastinal]
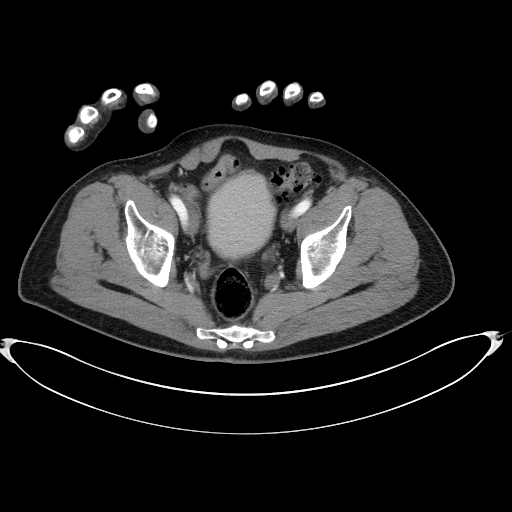
[im 43/141  mediastinal]
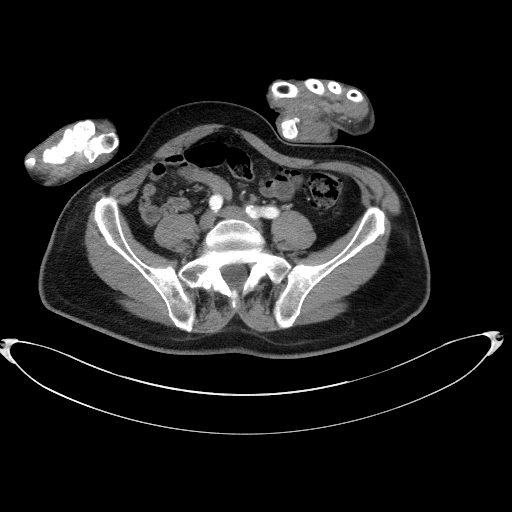
[im 57/141  mediastinal]
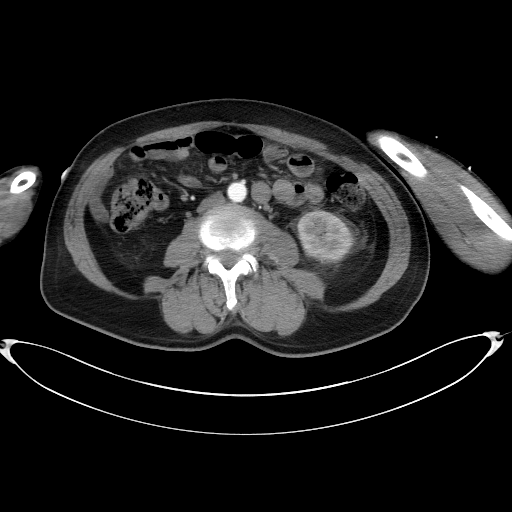
[im 71/141  mediastinal]
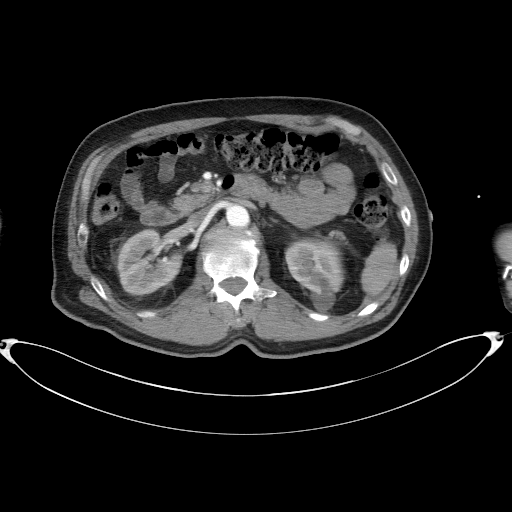
[im 85/141  mediastinal]
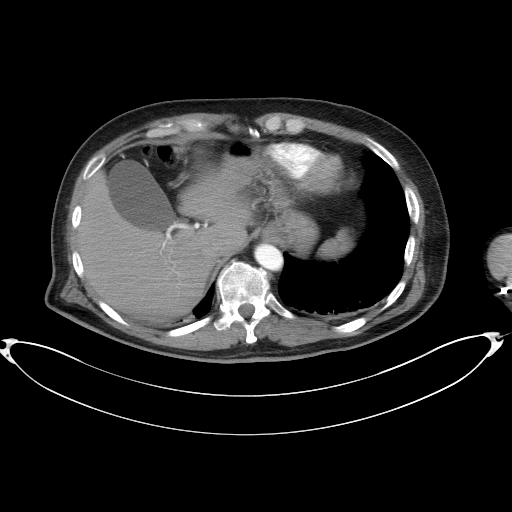
[im 99/141  mediastinal]
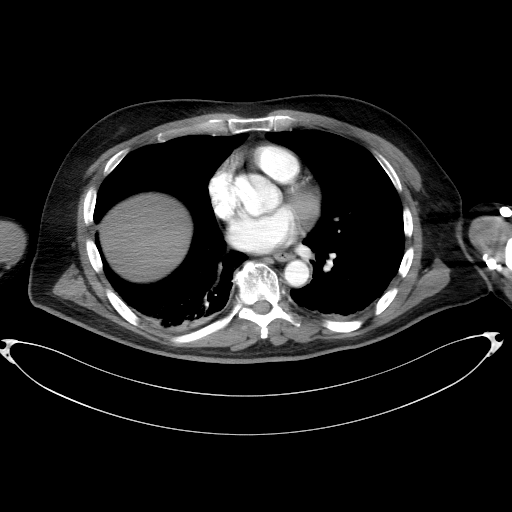
[im 113/141  mediastinal]
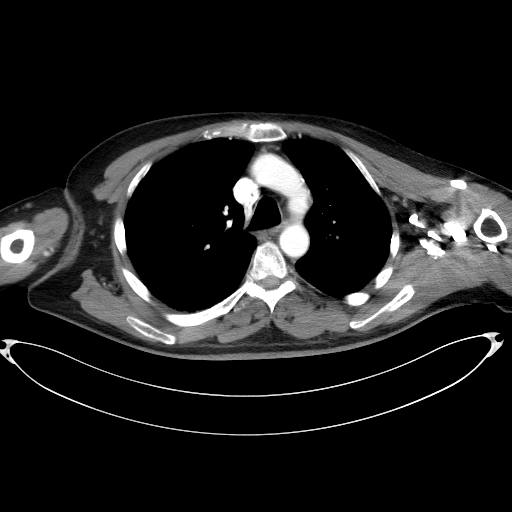
[im 127/141  mediastinal]
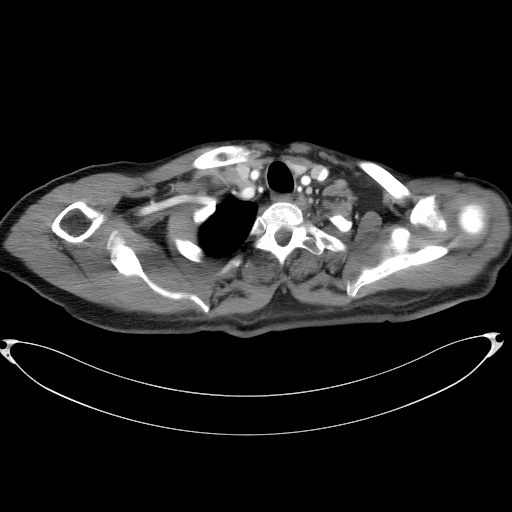
[im 127/141  bone]
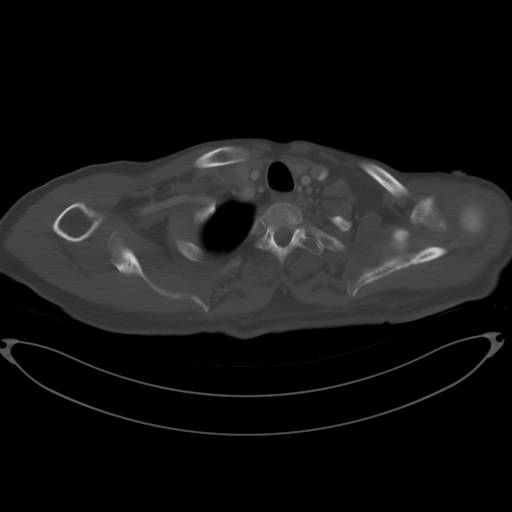

[Series 4: lung · axial · 0.87mm/px · z∈[-378,-326]mm · 2 of 186 slices shown]
[im 14/186  bone]
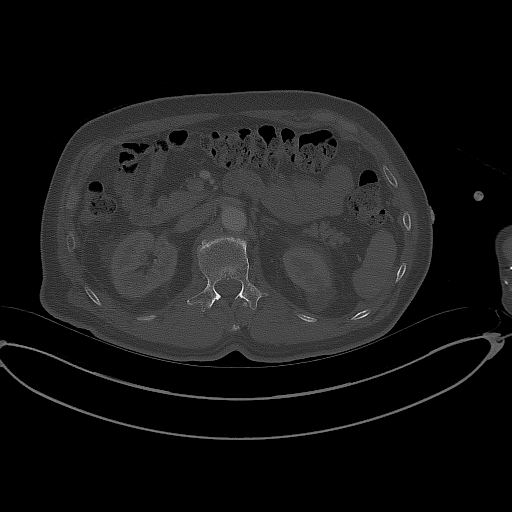
[im 40/186  bone]
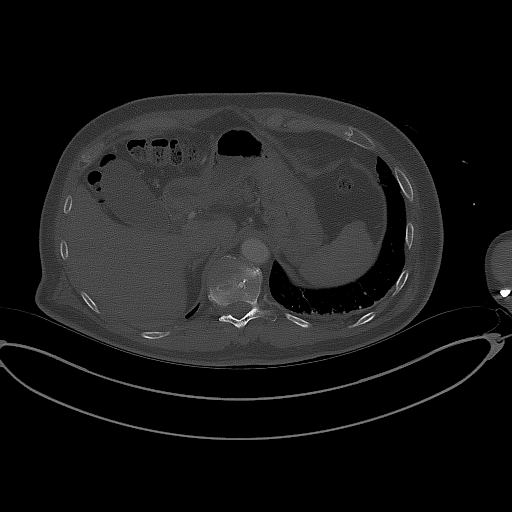

[Series 5: cap with 3mm st cor · coronal · 0.76mm/px · 3 of 156 slices shown]
[im 32/156  mediastinal]
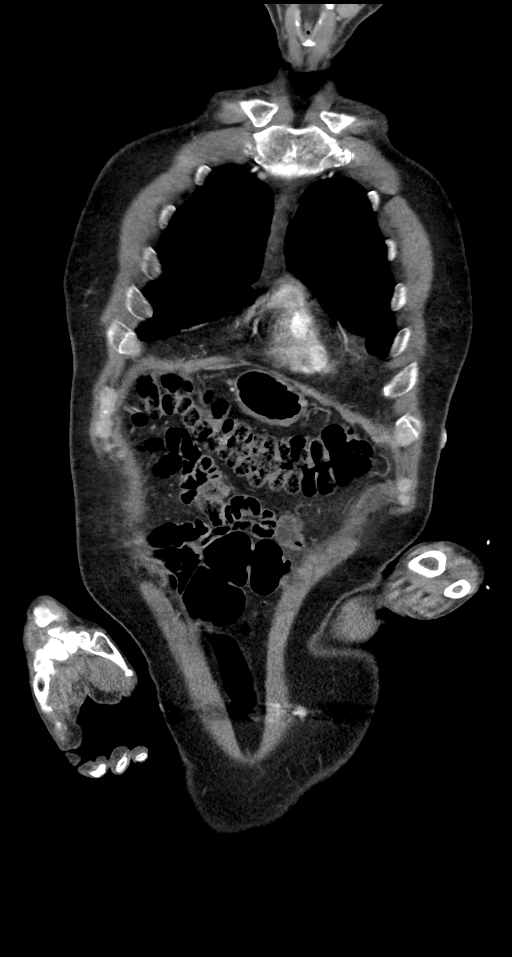
[im 63/156  mediastinal]
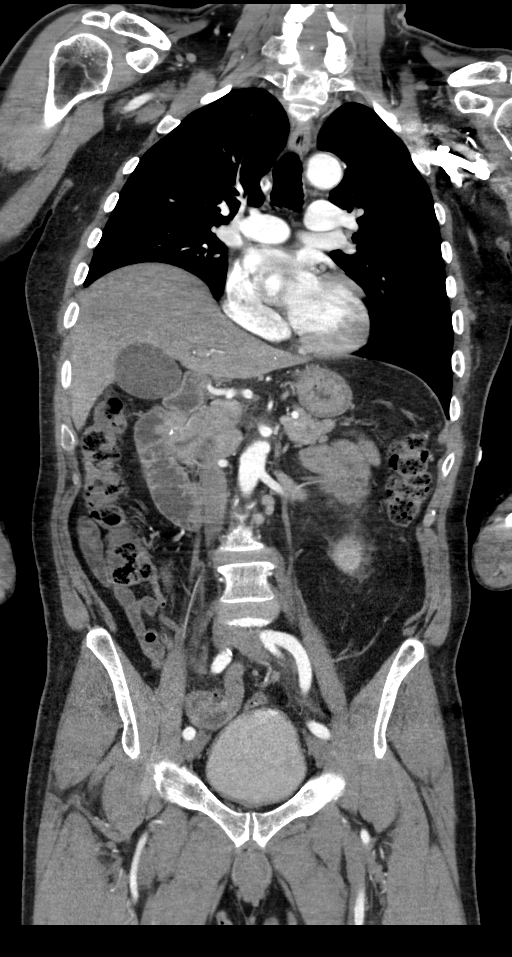
[im 94/156  mediastinal]
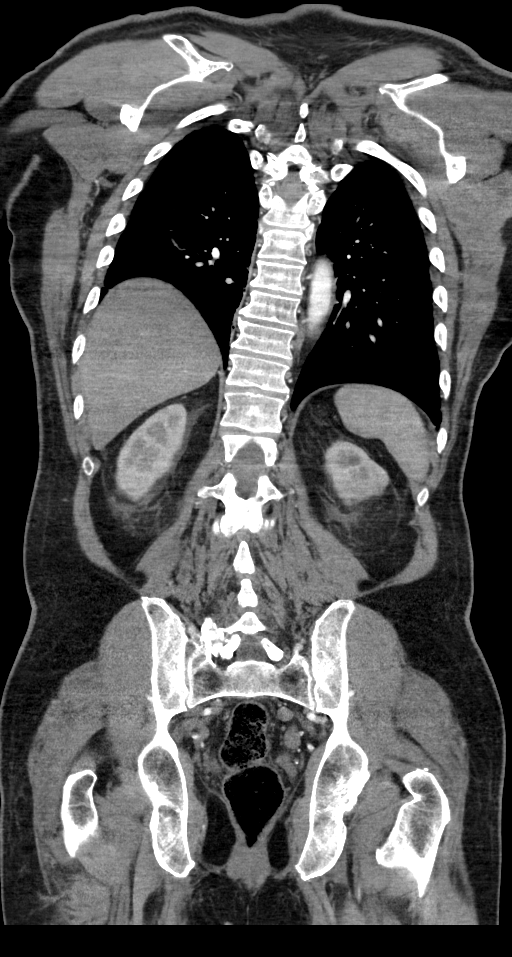

[14 of 36 positions shown; findings below may reference images not displayed]

FINDINGS: CT CHEST FINDINGS

Cardiovascular: No significant vascular findings. Normal heart size.
No pericardial effusion.

Mediastinum/Nodes: No enlarged mediastinal, hilar, or axillary lymph
nodes. Thyroid gland, trachea, and esophagus demonstrate no
significant findings.

Lungs/Pleura: Lungs are clear. No pleural effusion or pneumothorax.

Musculoskeletal: Lucent lesion of T1 height and small superior
endplate defect. No other focal osseous lesion.

CT ABDOMEN PELVIS FINDINGS

Hepatobiliary: No focal liver abnormality is seen. No gallstones,
gallbladder wall thickening, or biliary dilatation.

Pancreas: Unremarkable. No pancreatic ductal dilatation or
surrounding inflammatory changes.

Spleen: Normal in size without focal abnormality.

Adrenals/Urinary Tract: Normal adrenal glands. Bilateral mild
perinephric stranding. Exophytic left renal cyst measuring 16 mm.

Stomach/Bowel: Stomach is within normal limits. Appendix appears
normal. No evidence of bowel wall thickening, distention, or
inflammatory changes.

Vascular/Lymphatic: Aortic atherosclerosis. No enlarged abdominal or
pelvic lymph nodes.

Reproductive: Enlarged prostate measuring 5.8 cm transverse
dimension.

Other: No abdominal wall hernia or abnormality. No abdominopelvic
ascites.

Musculoskeletal: No acute or significant osseous findings.
IMPRESSION: 1. No primary malignancy identified in the chest, abdomen or pelvis.
2. Lucent lesion of T1 height and small superior endplate defect,
nonspecific. No other focal osseous lesions.
3. Bilateral mild perinephric stranding, nonspecific.
4. Enlarged prostate measuring 5.8 cm transverse dimension.

Aortic Atherosclerosis ([A8]-[A8]).

## 2020-12-08 IMAGING — MR MR CERVICAL SPINE W/O CM
7 of 8 series · 28 of 48 positions shown · non-contrast
Comparison: MRI cervical spine [DATE]

CLINICAL DATA: Possible cord infarct.

EXAM:
MRI CERVICAL AND THORACIC SPINE WITHOUT CONTRAST
TECHNIQUE: Multiplanar and multiecho pulse sequences of the cervical spine, to
include the craniocervical junction and cervicothoracic junction,
and the thoracic spine, were obtained without intravenous contrast.
Diffusion-weighted imaging was performed.

[Series 3: T2 · sagittal · 3.0mm · 0.35mm/px · 1 of 18 slices shown (1 of 2)]
[im 1/18]
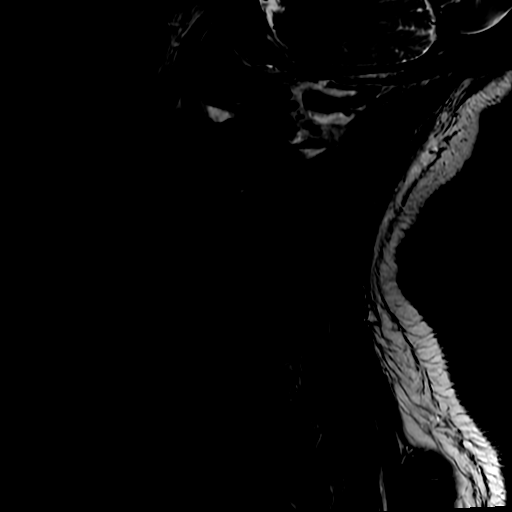

[Series 4: FLAIR · sagittal · 3.0mm · 0.35mm/px · 2 of 18 slices shown]
[im 1/18]
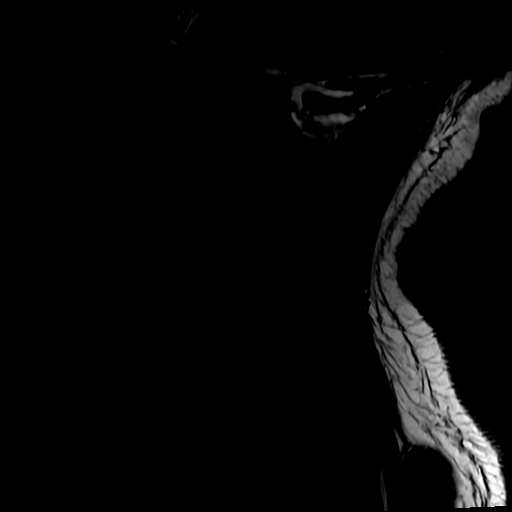
[im 18/18]
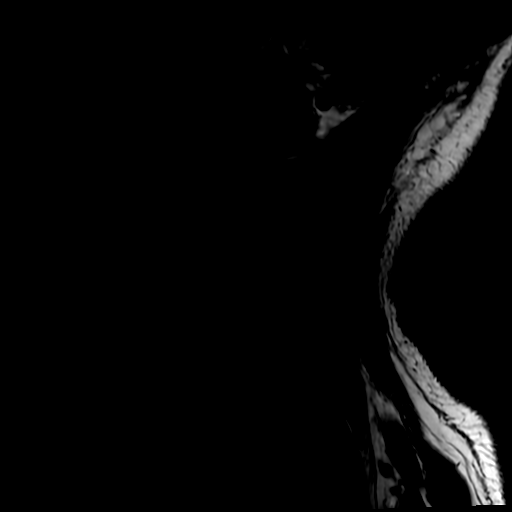

[Series 5: STIR · sagittal · 3.0mm · 0.35mm/px · 2 of 18 slices shown]
[im 1/18]
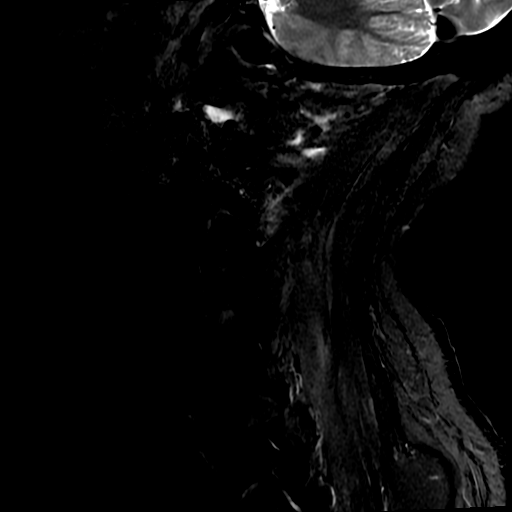
[im 18/18]
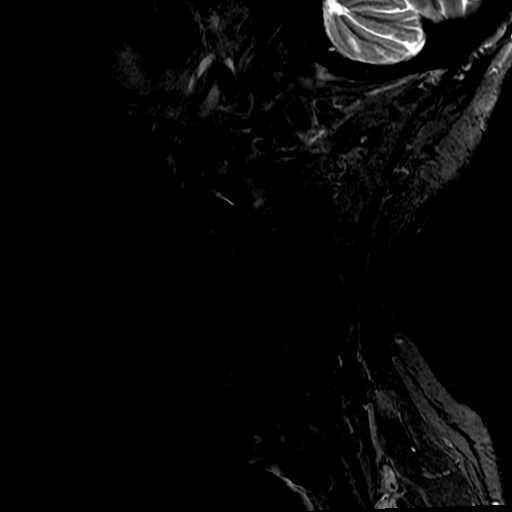

[Series 7: T2 · axial · 3.0mm · 0.35mm/px · z∈[-140,-14]mm · 5 of 42 slices shown (2 of 2)]
[im 1/42]
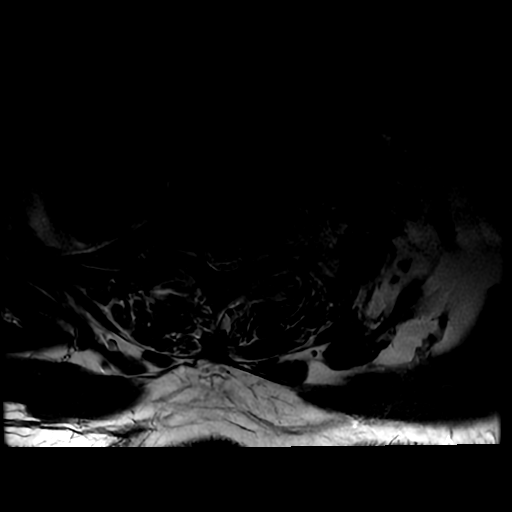
[im 11/42]
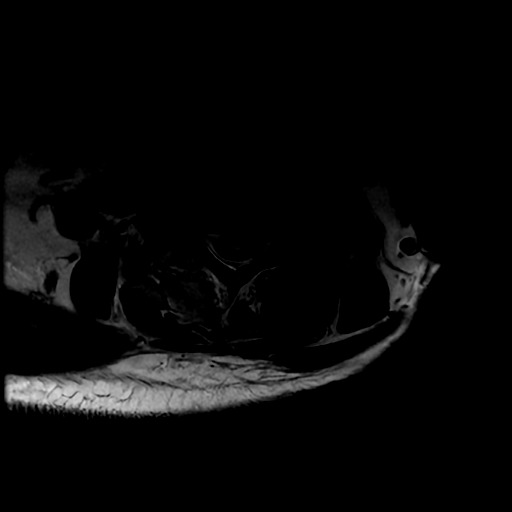
[im 21/42]
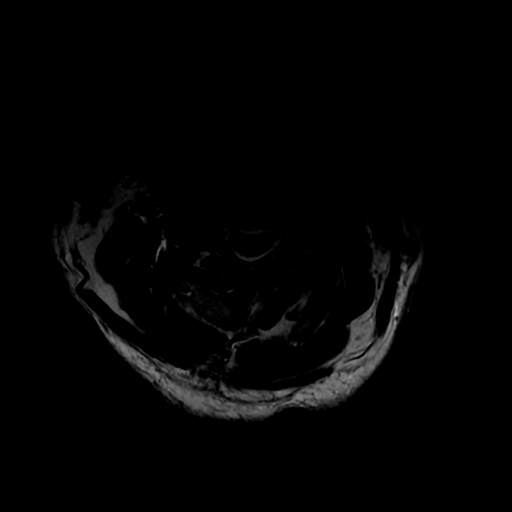
[im 31/42]
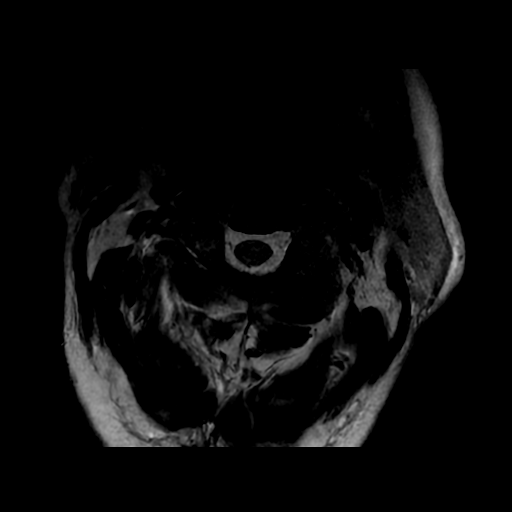
[im 42/42]
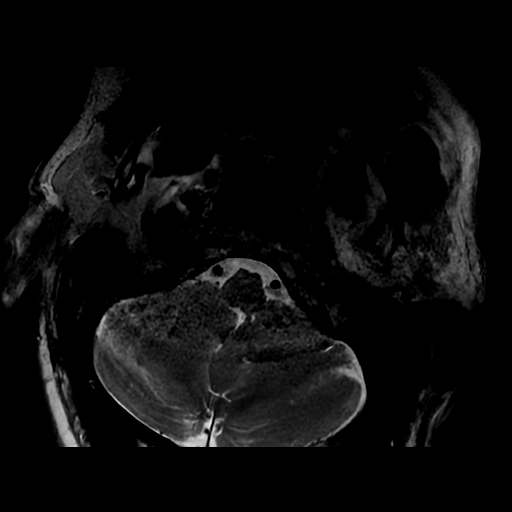

[Series 8: T1 · axial · non-contrast · 3.0mm · 0.35mm/px · z∈[-140,-14]mm · 5 of 42 slices shown]
[im 1/42]
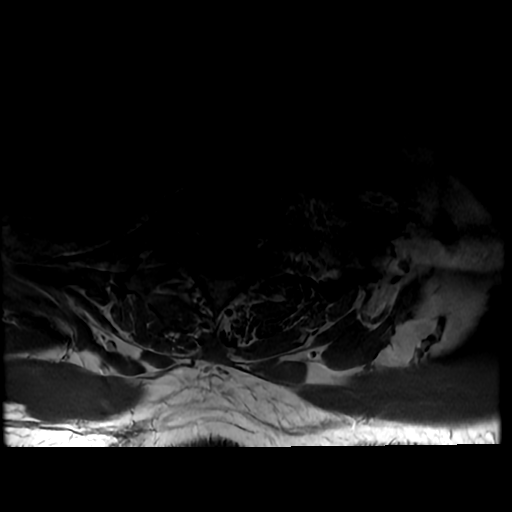
[im 11/42]
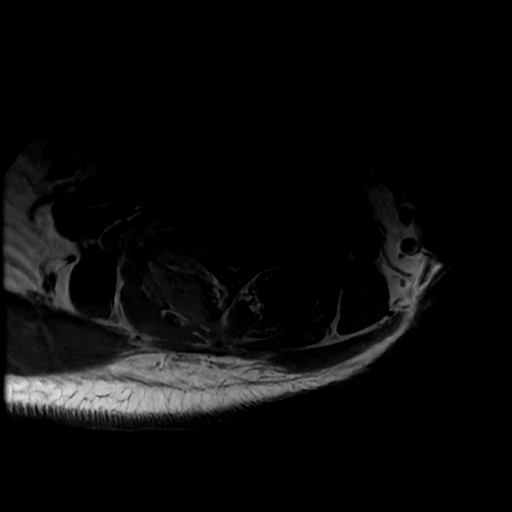
[im 21/42]
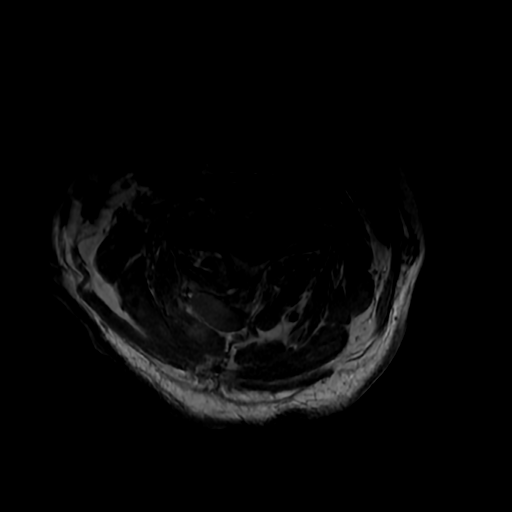
[im 31/42]
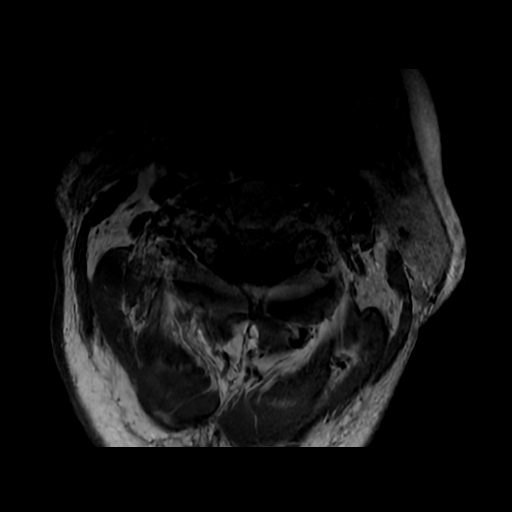
[im 42/42]
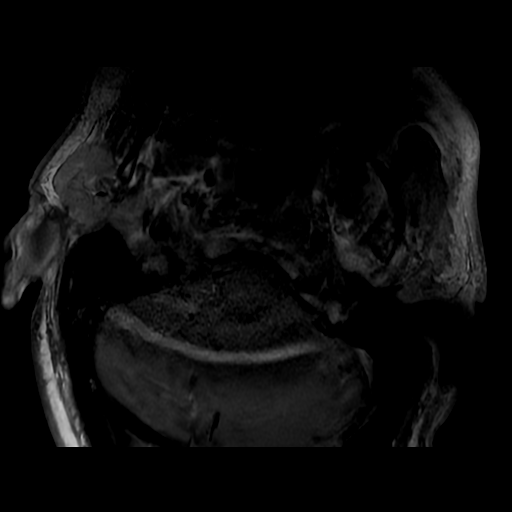

[Series 9: DWI · axial · 3.0mm · 0.51mm/px · z∈[-129,+3]mm · 8 of 99 slices shown]
[im 1/99]
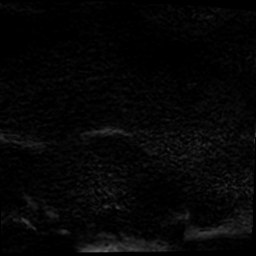
[im 18/99]
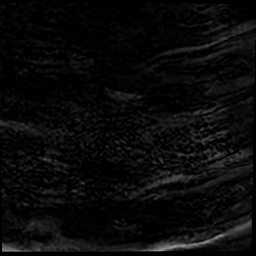
[im 27/99]
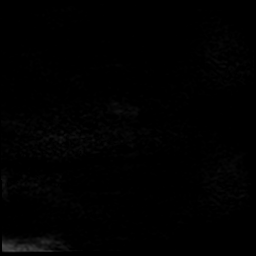
[im 45/99]
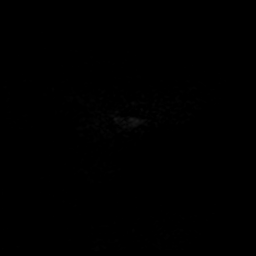
[im 54/99]
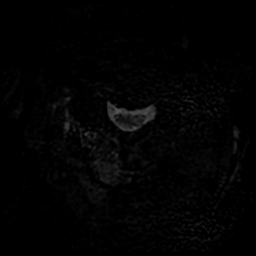
[im 72/99]
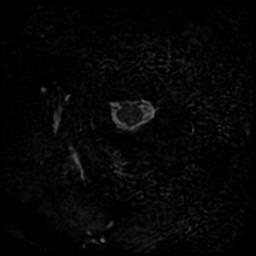
[im 81/99]
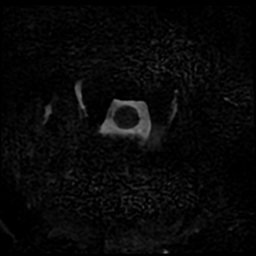
[im 99/99]
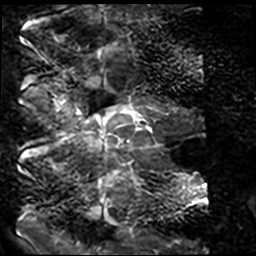

[Series 950: ADC · axial · 3.0mm · 0.51mm/px · z∈[-129,-24]mm · 5 of 49 slices shown]
[im 1/49]
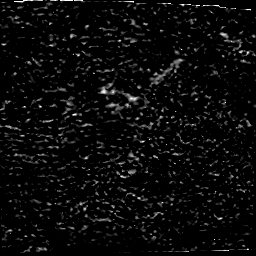
[im 10/49]
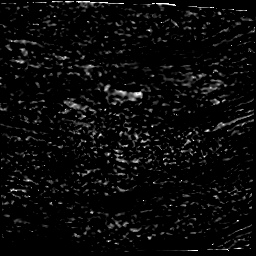
[im 20/49]
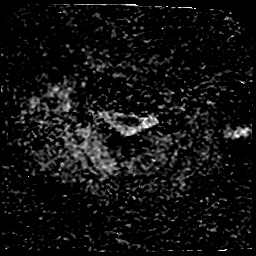
[im 29/49]
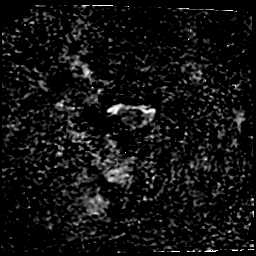
[im 39/49]
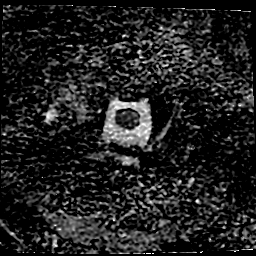

[28 of 48 positions shown; findings below may reference images not displayed]

FINDINGS: MRI CERVICAL SPINE FINDINGS

Findings of the cervical spine, including the long segment central
cord signal abnormality, are unchanged from the earlier cervical
spine MRI. Additionally, diffusion-weighted imaging was performed
however, the images are of limited diagnostic value.

MRI THORACIC SPINE FINDINGS

Alignment:  Physiologic.

Vertebrae: Unchanged possible pathologic fracture at T1.

Cord: Central cord signal abnormality terminates at the T1-2 level.
The remainder of the thoracic spinal cord is normal.

Paraspinal and other soft tissues: Negative.

Disc levels:

No spinal canal or neural foraminal stenosis.

Nondiagnostic diffusion-weighted imaging.
IMPRESSION: 1. Nondiagnostic attempted diffusion-weighted imaging of the
cervical and thoracic spine. Utilization of a more tailored spine
MRI protocol may be helpful if this can be made available.
2. Unchanged long segment central cervical spinal cord signal
abnormality, which terminates at the T1-2 level. No signal
abnormality within the remainder of the thoracic spinal cord.
3. Unchanged appearance of possible pathologic fracture at T1.

## 2020-12-08 MED ORDER — CHLORHEXIDINE GLUCONATE CLOTH 2 % EX PADS
6.0000 | MEDICATED_PAD | Freq: Every day | CUTANEOUS | Status: DC
  Administered 2020-12-08 – 2020-12-13 (×6): 6 via TOPICAL

## 2020-12-08 MED ORDER — IOHEXOL 300 MG/ML  SOLN
100.0000 mL | Freq: Once | INTRAMUSCULAR | Status: AC | PRN
  Administered 2020-12-08: 100 mL via INTRAVENOUS

## 2020-12-08 MED ORDER — ATORVASTATIN CALCIUM 80 MG PO TABS
80.0000 mg | ORAL_TABLET | Freq: Every day | ORAL | Status: DC
  Administered 2020-12-08 – 2020-12-13 (×6): 80 mg via ORAL
  Filled 2020-12-08 (×6): qty 1

## 2020-12-08 MED ORDER — ASPIRIN EC 81 MG PO TBEC
81.0000 mg | DELAYED_RELEASE_TABLET | Freq: Every day | ORAL | Status: DC
  Administered 2020-12-08 – 2020-12-13 (×6): 81 mg via ORAL
  Filled 2020-12-08 (×6): qty 1

## 2020-12-08 MED ORDER — IOHEXOL 350 MG/ML SOLN
75.0000 mL | Freq: Once | INTRAVENOUS | Status: AC | PRN
  Administered 2020-12-08: 75 mL via INTRAVENOUS

## 2020-12-08 MED ORDER — POLYVINYL ALCOHOL 1.4 % OP SOLN: 1.0000 [drp] | OPHTHALMIC | Status: DC | PRN

## 2020-12-08 NOTE — Evaluation (Signed)
Physical Therapy Evaluation Patient Details Name: [name] MRN: [id number] DOB: [birth year] Today's Date: 12/08/2020   History of Present Illness  75 yo male with onset of gradually increasing weakness was brought to ED.  Pt has history of being able to do yardwork the day before arrival but then global weakness occurred.  Pt reports tingling pain on R posterior neck and upper thoracic spine.  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   Differential dx now is between myelitis and spinal stroke.  PMHx:  atherosclerosis, spinal stenosis C7-T1,  Clinical Impression  Pt is demonstrating global weakness, with 2 person assist needed for bed mobility, and sitting up not attempted due to the surface upon which he is lying. Pt is quite alert and engaged in the process of what he has going on, very concerned about what is creating his weakness.  Follow along with him and will work on getting a hospital bed to give him a safer surface to sit and begin larger movements.  CIR recommended due to his concerted efforts in the lengthy eval process, his tolerance for the work and the great level of function that preceded his arrival to hosp.  Pt has good touch sensation overall, and expect him to be a good judge of his ability to sit and move.    Follow Up Recommendations CIR    Equipment Recommendations  None recommended by PT    Recommendations for Other Services Rehab consult     Precautions / Restrictions Precautions Precautions: Back Precaution Booklet Issued: No Precaution Comments: to protect for spinal stroke Restrictions Weight Bearing Restrictions: No Other Position/Activity Restrictions: protection of spine      Mobility  Bed Mobility Overal bed mobility: Needs Assistance Bed Mobility: Rolling Rolling: Mod assist;+2 for physical assistance;+2 for safety/equipment          General bed mobility comments: rolled with care due to tingling pain on neck and upper thoracic spine    Transfers                 General transfer comment: deferred  Ambulation/Gait             General Gait Details: unable  Stairs            Wheelchair Mobility    Modified Rankin (Stroke Patients Only) Modified Rankin (Stroke Patients Only) Pre-Morbid Rankin Score: No symptoms Modified Rankin: Severe disability     Balance Overall balance assessment:  (unable to assess)                                           Pertinent Vitals/Pain Pain Assessment: Faces Faces Pain Scale: Hurts even more Pain Location: R posterior neck and upper back Pain Descriptors / Indicators: Tingling;Tightness;Guarding Pain Intervention(s): Limited activity within patient's tolerance;Monitored during session;Repositioned    Home Living Family/patient expects to be discharged to:: Inpatient rehab Living Arrangements: Spouse/significant other Available Help at Discharge: Family;Available 24 hours/day             Additional Comments: Pt reports he lives with his wife.    Prior Function Level of Independence: Independent         Comments: Pt reports he was fully independent PTA     Hand Dominance  Extremity/Trunk Assessment   Upper Extremity Assessment Upper Extremity Assessment: Defer to OT evaluation    Lower Extremity Assessment Lower Extremity Assessment: Generalized weakness (has trace movement on R hip to extend, to flex R knee and to extend great toe;  No movt LLE)       Communication   Communication: No difficulties  Cognition Arousal/Alertness: Awake/alert Behavior During Therapy: WFL for tasks assessed/performed Overall Cognitive Status: Within Functional Limits for tasks assessed                                 General Comments: talked at length about his work and recent history      General  Comments General comments (skin integrity, edema, etc.): pt is on gurney with generalized weakness, requiring two person help to roll and slide up in the bed.  Pt requires a hosp bed to move him and protect skin, to attempt larger mobility    Exercises     Assessment/Plan    PT Assessment Patient needs continued PT services  PT Problem List Decreased strength;Decreased activity tolerance;Decreased mobility;Pain       PT Treatment Interventions DME instruction;Functional mobility training;Therapeutic activities;Therapeutic exercise;Balance training;Neuromuscular re-education;Patient/family education    PT Goals (Current goals can be found in the Care Plan section)  Acute Rehab PT Goals Patient Stated Goal: to get his strength back PT Goal Formulation: With patient Time For Goal Achievement: 12/22/20 Potential to Achieve Goals: Fair    Frequency Min 4X/week   Barriers to discharge Decreased caregiver support home with wife    Co-evaluation               AM-PAC PT "6 Clicks" Mobility  Outcome Measure Help needed turning from your back to your side while in a flat bed without using bedrails?: Total Help needed moving from lying on your back to sitting on the side of a flat bed without using bedrails?: Total Help needed moving to and from a bed to a chair (including a wheelchair)?: Total Help needed standing up from a chair using your arms (e.g., wheelchair or bedside chair)?: Total Help needed to walk in hospital room?: Total Help needed climbing 3-5 steps with a railing? : Total 6 Click Score: 6    End of Session   Activity Tolerance: Treatment limited secondary to medical complications (Comment) Patient left: in bed;with call bell/phone within reach;with nursing/sitter in room Nurse Communication: Mobility status;Other (comment) (equipment needs) PT Visit Diagnosis: Muscle weakness (generalized) (M62.81);Pain;Other abnormalities of gait and mobility (R26.89) Pain -  Right/Left: Right Pain - part of body:  (neck and B upper back)    Time: 9357-0177 PT Time Calculation (min) (ACUTE ONLY): 47 min   Charges:   PT Evaluation $PT Eval High Complexity: 1 High PT Treatments $Therapeutic Exercise: 8-22 mins       Ivar Drape 12/08/2020, 1:44 PM  Samul Dada, PT MS Acute Rehab Dept. Number: [facility] [phone] and [facility] [phone]

## 2020-12-08 NOTE — ED Notes (Signed)
Modified [facility] delayed d/t pt being in MRI & CT.

## 2020-12-08 NOTE — ED Notes (Signed)
Patient transported to MRI 

## 2020-12-08 NOTE — Progress Notes (Addendum)
STROKE TEAM PROGRESS NOTE   ATTENDING NOTE: I reviewed above note and agree with the assessment and plan. Pt was seen and examined.   75 year old male with no known significant PMH presented to ER for acute onset bilateral shoulder pain followed by quadriparesis.  Per patient and wife, Friday early morning at 3:30 AM, patient complaining of bilateral shoulder pain, between shoulder blade pain.  In the morning, he got up, walked to the kitchen making coffee, he was able to walk with no obvious difficulty.  After making coffee, he felt whole-body weakness, bilateral shoulder pain spreading to left chest.  She went back to bed, and wife called 911 about 15 to 20 minutes later.  On EMS arrival, patient tried to get up to go to ambulance, he felt significant lower extremity weakness, not able to hold up, he fell.  He has bilateral upper extremity also in flexion position, feeling weak.  The symptoms develop from normal baseline to quadriplegia in about 30 minutes.    On this admission, MRI brain showed 2 small infarcts at bilateral MCA/ACA territory.  MRI spine showing abnormal T2 signal within C5 to T1/2.  Pan CT negative for malignancy. CTA head and neck, carotid Doppler unremarkable.  LE venous Doppler negative for DVT.  CT C/A/P showed no malignancy or sarcoidosis.  2D echo pending.  LDL 180, A1c 5.5, creatinine 1.00.  Initial INR 1.8, today repeat 1.3.  Platelet yesterday 103, today repeat 81.  On exam, patient lethargic, however easily arousable, awake, alert, able to recount history with me.  Orientated to age and place and people and time.  No aphasia, follows simple commands, able to name and repeat.  Mild dysarthria.  Visual fold full, cranial nerves intact.  However bilateral upper extremity in flexion position with increased tone, bilateral proximal 2/5.  Distal 1/5.  However bilateral lower extremity no movement.  Sensation symmetrical bilaterally, however seem to have sensory level at the T4, below  T4 decreased light touch sensation, however preserved proprioception.  Based on time course of quadriplegia, patient's symptoms concerning for spinal cord infarct along with bilateral MCA/ACA punctate infarcts, embolic pattern, concerning for cardioembolic source.  Currently on statin on aspirin 81, avoid DAPT at this time in case patient needed LP and CSF analysis.  Continue Lipitor 80.  DDx including myelitis, MS, NMO, spinal cord compression, or sarcoidosis.  However, MRI did not support spinal cord compression, CT chest did not support sarcoidosis.  Given working diagnosis of stroke, felt LP and CSF analysis will be less yield.  However, given patient severity of quadriparesis, will consider LP to finish off the work-up.  Patient is in agreement.  Recommend 30-day Cardic event monitor versus loop recorder to rule out cardioembolic source.  INR today 1.3 down from 1.8 yesterday, platelet however, decreased from 103 yesterday to 81 today.  We will repeat CBC and IR, if INR normalized, and thrombocytopenia stable, may consider LP.  Continue PT/OT.  For detailed assessment and plan, please refer to above as I have made changes wherever appropriate.   Jesus PlanJindong Rustin Erhart, MD PhD Stroke Neurology 12/08/2020 10:54 PM  I spent  35 minutes in total face-to-face time with the patient, more than 50% of which was spent in counseling and coordination of care, reviewing test results, images and medication, and discussing the diagnosis, treatment plan and potential prognosis. This patient's care requiresreview of multiple databases, neurological assessment, discussion with family, other specialists and medical decision making of high complexity.  INTERVAL HISTORY  Patient remains in ED since presentation yesterday due to sudden onset of pain across shoulder blades and then his posterior neck and into his clavicles after which his arms became weak and then he became progressively weak in his lower extremities over  the next two hours. Wife called EMS and by the time they arrived he was unable to stand. He denies any recent illness, accidents, trauma, fall, fever, fatigue, weight loss, new pain or other changes in his state of health. He spent his last day prior to admission cleaning out a room in his house without any difficulty. His wife is at the bedside and present for discussion. She also reported no recent changes in husband's health.  We discussed ongoing work up and plan of care.   Vitals:   12/08/20 0800 12/08/20 0900 12/08/20 0907 12/08/20 1000  BP: (!) 109/55 (!) 126/57  116/62  Pulse: (!) 52 (!) 56  (!) 58  Resp: Temp:      TempSrc:      SpO2: 99% 95%  94%  Weight:   72.6 kg   Height:   5' 8.5" (1.74 m)    CBC:  Recent Labs  Lab 12/07/20 1207  WBC 8.8  NEUTROABS 7.7  HGB 15.2  HCT 43.1  MCV 90.4  PLT 103*   Basic Metabolic Panel:  Recent Labs  Lab 12/07/20 1207  NA 135  K 4.1  CL 102  CO2 23  GLUCOSE 124*  BUN 13  CREATININE 1.00  CALCIUM 9.6   Lipid Panel:  Recent Labs  Lab 12/08/20 0507  CHOL 231*  TRIG 66  HDL 38*  CHOLHDL 6.1  VLDL 13  LDLCALC 782*   HgbA1c:  Recent Labs  Lab 12/08/20 0507  HGBA1C 5.5   Urine Drug Screen:  Recent Labs  Lab 12/07/20 2210  LABOPIA NONE DETECTED  COCAINSCRNUR NONE DETECTED  LABBENZ NONE DETECTED  AMPHETMU NONE DETECTED  THCU NONE DETECTED  LABBARB NONE DETECTED    Alcohol Level  Recent Labs  Lab 12/07/20 1136  ETH <10    IMAGING past 24 hours CT ANGIO HEAD NECK W WO CM  Result Date: 12/08/2020 CLINICAL DATA:  Stroke, follow-up. Acute quadriparesis. Small acute left perirolandic infarct on head MRI and suspected cervical and upper thoracic spinal cord infarct on spine MRI. EXAM: CT ANGIOGRAPHY HEAD AND NECK TECHNIQUE: Multidetector CT imaging of the head and neck was performed using the standard protocol during bolus administration of intravenous contrast. Multiplanar CT image reconstructions and  MIPs were obtained to evaluate the vascular anatomy. Carotid stenosis measurements (when applicable) are obtained utilizing NASCET criteria, using the distal internal carotid diameter as the denominator. CONTRAST:  75mL OMNIPAQUE IOHEXOL 350 MG/ML SOLN COMPARISON:  MRIs of the head, cervical spine, and thoracic spine from earlier today and yesterday FINDINGS: CT HEAD FINDINGS Brain: The subcentimeter acute left perirolandic infarct was better demonstrated on MRI. No new infarct, acute intracranial hemorrhage, mass, midline shift, or extra-axial fluid collection is identified. The ventricles and sulci are within normal limits for age. Hypodensities in the cerebral white matter bilaterally are nonspecific but compatible with mild chronic small vessel ischemic disease. Vascular: Calcified atherosclerosis at the skull base. Skull: No fracture or suspicious osseous lesion. Sinuses: Mild mucosal thickening in the paranasal sinuses. Clear mastoid air cells. Orbits: Unremarkable. Review of the MIP images confirms the above findings CTA NECK FINDINGS Aortic arch: Normal variant aortic arch branching pattern with common origin of  the brachiocephalic and left common carotid arteries and with the left vertebral artery arising directly from the arch. Widely patent arch vessel origins. Right carotid system: Patent without evidence of stenosis, dissection, or significant atherosclerosis. Left carotid system: Patent without evidence of stenosis, dissection, or significant atherosclerosis. Vertebral arteries: Patent and codominant without evidence of stenosis, dissection, or significant atherosclerosis. Skeleton: Lucency in the T1 vertebral body with posterior cortical erosion and focal superior endplate fracture as seen on MRI. Other neck: No evidence of cervical lymphadenopathy or mass. Upper chest: Clear lung apices. Review of the MIP images confirms the above findings CTA HEAD FINDINGS Anterior circulation: The internal carotid  arteries are patent from skull base to carotid termini with minimal nonstenotic plaque. ACAs and MCAs are patent without evidence of a proximal branch occlusion or significant proximal stenosis. 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 No aneurysm is identified. Posterior circulation: The intracranial vertebral arteries are widely patent to the basilar. Patent left PICA, right AICA, and bilateral SCA origins are visualized. The basilar artery is widely patent. There are moderately large right and diminutive left posterior communicating arteries with hypoplasia of the right P1 segment, and there are also large anterior choroidal arteries which supply a portion of the PCA territory bilaterally. No significant proximal PCA stenosis is evident. No aneurysm is identified. Venous sinuses: Patent. Anatomic variants: As above. Review of the MIP images confirms the above findings IMPRESSION: 1. Minimal atherosclerosis in the head and neck without large vessel occlusion or significant proximal stenosis. 2. Lucency in the T1 vertebral body with posterior cortical erosion and superior endplate fracture as seen on MRI. This remains more concerning for neoplasm (metastatic disease, lymphoma, myeloma) as opposed to a benign bone infarct. Electronically Signed   By: Sebastian Ache M.D.   On: 12/08/2020 11:24   CT HEAD WO CONTRAST  Result Date: 12/07/2020 CLINICAL DATA:  Mental status change, unknown cause. Altered mental status. EXAM: CT HEAD WITHOUT CONTRAST TECHNIQUE: Contiguous axial images were obtained from the base of the skull through the vertex without intravenous contrast. COMPARISON:  No pertinent prior exams available for comparison. FINDINGS: Brain: Cerebral volume is normal. There is no acute intracranial hemorrhage. No demarcated cortical infarct. No extra-axial fluid collection. No evidence of an intracranial mass. No midline shift. Vascular: No hyperdense vessel.  Atherosclerotic calcifications Skull: No calvarial  fracture or focal suspicious osseous lesion. Hyperostosis frontalis interna, predominantly on the left. Sinuses/Orbits: Visualized orbits show no acute finding. Mild bilateral ethmoid sinus mucosal thickening. Trace mucosal thickening within the bilateral sphenoid sinuses. Small bilateral maxillary sinus mucous retention cysts. IMPRESSION: No evidence of acute intracranial abnormality. Mild paranasal sinus disease, as described. Electronically Signed   By: Jackey Loge DO   On: 12/07/2020 13:05   MR Brain W and Wo Contrast  Result Date: 12/07/2020 CLINICAL DATA:  Neuro deficit, acute, stroke suspected.  Weakness. EXAM: MRI HEAD WITHOUT AND WITH CONTRAST TECHNIQUE: Multiplanar, multiecho pulse sequences of the brain and surrounding structures were obtained without and with intravenous contrast. CONTRAST:  29mL GADAVIST GADOBUTROL 1 MMOL/ML IV SOLN COMPARISON:  Head CTA 522 FINDINGS: Brain: There is a 6 mm acute cortical/subcortical infarct in the left perirolandic region. Scattered small T2 hyperintensities in the cerebral white matter bilaterally are nonspecific but compatible with mild chronic small vessel ischemic disease. The ventricles and sulci are within normal limits for age. No intracranial hemorrhage, mass, midline shift, extra-axial fluid collection, or abnormal enhancement is identified. Vascular: Major intracranial vascular flow voids are preserved. Skull  and upper cervical spine: Unremarkable bone marrow signal. Asymmetric left frontal hyperostosis interna. Sinuses/Orbits: Unremarkable orbits. Unremarkable orbits. Mild bilateral ethmoid air cell mucosal thickening. Other: None. IMPRESSION: 1. 6 mm acute left perirolandic infarct. 2. Mild chronic small vessel ischemic disease. Electronically Signed   By: Sebastian Ache M.D.   On: 12/07/2020 18:14   MR CERVICAL SPINE WO CONTRAST  Result Date: 12/08/2020 CLINICAL DATA:  Possible cord infarct. EXAM: MRI CERVICAL AND THORACIC SPINE WITHOUT CONTRAST  TECHNIQUE: Multiplanar and multiecho pulse sequences of the cervical spine, to include the craniocervical junction and cervicothoracic junction, and the thoracic spine, were obtained without intravenous contrast. Diffusion-weighted imaging was performed. COMPARISON:  MRI cervical spine 12/07/2020 FINDINGS: MRI CERVICAL SPINE FINDINGS Findings of the cervical spine, including the long segment central cord signal abnormality, are unchanged from the earlier cervical spine MRI. Additionally, diffusion-weighted imaging was performed however, the images are of limited diagnostic value. MRI THORACIC SPINE FINDINGS Alignment:  Physiologic. Vertebrae: Unchanged possible pathologic fracture at T1. Cord: Central cord signal abnormality terminates at the T1-2 level. The remainder of the thoracic spinal cord is normal. Paraspinal and other soft tissues: Negative. Disc levels: No spinal canal or neural foraminal stenosis. Nondiagnostic diffusion-weighted imaging. IMPRESSION: 1. Nondiagnostic attempted diffusion-weighted imaging of the cervical and thoracic spine. Utilization of a more tailored spine MRI protocol may be helpful if this can be made available. 2. Unchanged long segment central cervical spinal cord signal abnormality, which terminates at the T1-2 level. No signal abnormality within the remainder of the thoracic spinal cord. 3. Unchanged appearance of possible pathologic fracture at T1. Electronically Signed   By: Deatra Robinson M.D.   On: 12/08/2020 02:35   MR THORACIC SPINE WO CONTRAST  Result Date: 12/08/2020 CLINICAL DATA:  Possible cord infarct. EXAM: MRI CERVICAL AND THORACIC SPINE WITHOUT CONTRAST TECHNIQUE: Multiplanar and multiecho pulse sequences of the cervical spine, to include the craniocervical junction and cervicothoracic junction, and the thoracic spine, were obtained without intravenous contrast. Diffusion-weighted imaging was performed. COMPARISON:  MRI cervical spine 12/07/2020 FINDINGS: MRI  CERVICAL SPINE FINDINGS Findings of the cervical spine, including the long segment central cord signal abnormality, are unchanged from the earlier cervical spine MRI. Additionally, diffusion-weighted imaging was performed however, the images are of limited diagnostic value. MRI THORACIC SPINE FINDINGS Alignment:  Physiologic. Vertebrae: Unchanged possible pathologic fracture at T1. Cord: Central cord signal abnormality terminates at the T1-2 level. The remainder of the thoracic spinal cord is normal. Paraspinal and other soft tissues: Negative. Disc levels: No spinal canal or neural foraminal stenosis. Nondiagnostic diffusion-weighted imaging. IMPRESSION: 1. Nondiagnostic attempted diffusion-weighted imaging of the cervical and thoracic spine. Utilization of a more tailored spine MRI protocol may be helpful if this can be made available. 2. Unchanged long segment central cervical spinal cord signal abnormality, which terminates at the T1-2 level. No signal abnormality within the remainder of the thoracic spinal cord. 3. Unchanged appearance of possible pathologic fracture at T1. Electronically Signed   By: Deatra Robinson M.D.   On: 12/08/2020 02:35   MR Cervical Spine W or Wo Contrast  Result Date: 12/07/2020 CLINICAL DATA:  Demyelinating disease.  Weakness. EXAM: MRI CERVICAL SPINE WITHOUT AND WITH CONTRAST TECHNIQUE: Multiplanar and multiecho pulse sequences of the cervical spine, to include the craniocervical junction and cervicothoracic junction, were obtained without and with intravenous contrast. CONTRAST:  3mL GADAVIST GADOBUTROL 1 MMOL/ML IV SOLN COMPARISON:  None. FINDINGS: The study is motion degraded including severe motion on the axial T2 gradient echo  sequence. Alignment: Reversal of the normal cervical lordosis. Trace anterolisthesis of C4 on C5 and C7 on T1. Vertebrae: Abnormal marked T1 hypointensity, STIR hyperintensity, and mild enhancement involving the marrow throughout the majority of the T1  vertebral body and extending into the right-sided posterior elements with an associated fracture involving the superior endplate and posterior vertebral body cortex resulting in focal 40% vertebral body height loss and 3 mm retropulsion of the posterior vertebral body cortex. 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 Abnormal signal hypointensity posteriorly in the C6 and C7 vertebral bodies on the sagittal T1 postcontrast sequence without a corresponding abnormality on the other sequences to confirm an underlying lesion. Degenerative endplate changes at C5-6 and C6-7. Cord: Mild T2 hyperintensity involving the central and ventral spinal cord bilaterally from C5 into the included upper thoracic spine without enhancement. Posterior Fossa, vertebral arteries, paraspinal tissues: Unremarkable. Disc levels: C2-3: Severe right facet arthrosis result in mild-to-moderate right neural foraminal stenosis without spinal stenosis. C3-4: Moderate facet arthrosis results in mild right neural foraminal stenosis without spinal stenosis. C4-5: Anterolisthesis with bulging uncovered disc, uncovertebral spurring, and moderate right facet arthrosis result in mild spinal stenosis and moderate right neural foraminal stenosis. C5-6: Mild-to-moderate disc space narrowing. Disc bulging and uncovertebral spurring result in moderate right and severe left neural foraminal stenosis without significant spinal stenosis. C6-7: Mild-to-moderate disc space narrowing. Disc bulging, a small central disc protrusion, and uncovertebral spurring result in borderline spinal stenosis and severe right and moderate to severe left neural foraminal stenosis. C7-T1: Anterolisthesis with disc uncovering, infolding of the ligamentum flavum, and severe facet arthrosis result in mild-to-moderate spinal stenosis and severe bilateral neural foraminal stenosis. IMPRESSION: 1. Lesion involving the T1 vertebral body and  right-sided posterior elements with associated pathologic vertebral fracture concerning for metastatic disease or other neoplasm. 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 2. Abnormal T2 hyperintensity involving the central and ventral aspects of the spinal cord from C5 into the included upper thoracic spine. Considerations include cord infarct, transverse myelitis, viral myelitis, and other inflammatory processes such as sarcoidosis. No enhancement or masslike finding to suggest intramedullary cord tumor although thoracic spine imaging is recommended for further evaluation. 3. Disc and facet degeneration resulting in severe multilevel neural foraminal stenosis as above. 4. Mild-to-moderate spinal stenosis at C7-T1. Electronically Signed   By: Sebastian Ache M.D.   On: 12/07/2020 18:45   CT CHEST ABDOMEN PELVIS W CONTRAST  Result Date: 12/08/2020 CLINICAL DATA:  Thoracic spine osseous lesion. Assessment for potential primary malignancy EXAM: CT CHEST, ABDOMEN, AND PELVIS WITH CONTRAST TECHNIQUE: Multidetector CT imaging of the chest, abdomen and pelvis was performed following the standard protocol during bolus administration of intravenous contrast. CONTRAST:  OMNIPAQUE IOHEXOL 300 MG/ML  SOLN COMPARISON:  None. FINDINGS: CT CHEST FINDINGS Cardiovascular: No significant vascular findings. Normal heart size. No pericardial effusion. Mediastinum/Nodes: No enlarged mediastinal, hilar, or axillary lymph nodes. Thyroid gland, trachea, and esophagus demonstrate no significant findings. Lungs/Pleura: Lungs are clear. No pleural effusion or pneumothorax. Musculoskeletal: Lucent lesion of T1 height and small superior endplate defect. No other focal osseous lesion. CT ABDOMEN PELVIS FINDINGS Hepatobiliary: No focal liver abnormality is seen. No gallstones, gallbladder wall thickening, or biliary dilatation. Pancreas: Unremarkable. No pancreatic ductal dilatation or surrounding inflammatory changes. Spleen: Normal in  size without focal abnormality. Adrenals/Urinary Tract: Normal adrenal glands. Bilateral mild perinephric stranding. Exophytic left renal cyst measuring 16 mm. Stomach/Bowel: Stomach is within normal limits. Appendix appears  normal. No evidence of bowel wall thickening, distention, or inflammatory changes. Vascular/Lymphatic: Aortic atherosclerosis. No enlarged abdominal or pelvic lymph nodes. Reproductive: Enlarged prostate measuring 5.8 cm transverse dimension. Other: No abdominal wall hernia or abnormality. No abdominopelvic ascites. Musculoskeletal: No acute or significant osseous findings. IMPRESSION: 1. No primary malignancy identified in the chest, abdomen or pelvis. 2. Lucent lesion of T1 height and small superior endplate defect, nonspecific. No other focal osseous lesions. 3. Bilateral mild perinephric stranding, nonspecific. 4. Enlarged prostate measuring 5.8 cm transverse dimension. Aortic Atherosclerosis (ICD10-I70.0). Electronically Signed   By: Deatra Robinson M.D.   On: 12/08/2020 03:21   VAS US CAROTID  Result Date: 12/08/2020 Carotid Arterial Duplex Study Patient Name:  BILL YOHN  Date of Exam:   12/08/2020 Medical Rec #: [id number]     Accession #:    [id number] Date of Birth: 1945/08/16     Patient Gender: M Patient Age:   75 years Exam Location:  [facility] Procedure:      VAS US CAROTID Referring Phys: Lyn Hollingshead MELVIN --------------------------------------------------------------------------------  Indications:       CVA. Other Factors:     No known past medical history. Comparison Study:  No previous exams Performing Technologist: Jody Hill RVT, RDMS  Examination Guidelines: A complete evaluation includes B-mode imaging, spectral Doppler, color Doppler, and power Doppler as needed of all accessible portions of each vessel. Bilateral testing is considered an integral part of a complete examination. Limited examinations for reoccurring indications may be performed as noted.  Right  Carotid Findings: +----------+--------+--------+--------+------------------+------------------+           PSV cm/sEDV cm/sStenosisPlaque DescriptionComments           +----------+--------+--------+--------+------------------+------------------+ CCA Prox  107     0                                 intimal thickening +----------+--------+--------+--------+------------------+------------------+ CCA Distal94      0                                 intimal thickening +----------+--------+--------+--------+------------------+------------------+ ICA Prox  81      8                                                    +----------+--------+--------+--------+------------------+------------------+ ICA Distal34      8                                                    +----------+--------+--------+--------+------------------+------------------+ ECA       86      0                                                    +----------+--------+--------+--------+------------------+------------------+ +----------+--------+-------+----------------+-------------------+           PSV cm/sEDV cmsDescribe        Arm Pressure (mmHG) +----------+--------+-------+----------------+-------------------+ [id number]  Multiphasic, WNL                    +----------+--------+-------+----------------+-------------------+ +---------+--------+--+--------+-+---------+ VertebralPSV cm/s55EDV cm/s8Antegrade +---------+--------+--+--------+-+---------+  Left Carotid Findings: +----------+--------+--------+--------+------------------+------------------+           PSV cm/sEDV cm/sStenosisPlaque DescriptionComments           +----------+--------+--------+--------+------------------+------------------+ CCA Prox  103     0                                 intimal thickening +----------+--------+--------+--------+------------------+------------------+ CCA Distal108     12                                 intimal thickening +----------+--------+--------+--------+------------------+------------------+ ICA Prox  59      14                                                   +----------+--------+--------+--------+------------------+------------------+ ICA Distal63      15                                                   +----------+--------+--------+--------+------------------+------------------+ ECA       102     0                                                    +----------+--------+--------+--------+------------------+------------------+ +----------+--------+--------+----------------+-------------------+           PSV cm/sEDV cm/sDescribe        Arm Pressure (mmHG) +----------+--------+--------+----------------+-------------------+ [id number]             Multiphasic, WNL                    +----------+--------+--------+----------------+-------------------+ +---------+--------+--+--------+-+---------+ VertebralPSV cm/s46EDV cm/s6Antegrade +---------+--------+--+--------+-+---------+   Summary: Right Carotid: The extracranial vessels were near-normal with only minimal wall                thickening or plaque. Left Carotid: The extracranial vessels were near-normal with only minimal wall               thickening or plaque. Vertebrals:  Bilateral vertebral arteries demonstrate antegrade flow. Subclavians: Normal flow hemodynamics were seen in bilateral subclavian              arteries. *See table(s) above for measurements and observations.     Preliminary     PHYSICAL EXAM Constitutional: Appears well-developed and well-nourished. Psych: Affect appropriate to situation MSK: no joint deformities. Cardiovascular: Normal rate and regular rhythm. Respiratory: Effort normal, non-labored breathing Skin: Warm, dry and intact    Neuro: Mental Status: Patient is awake, alert, oriented to person, place, month, year, and situation. Patient is able to  give a clear and coherent history. No signs of aphasia or neglect Cranial Nerves: II: Visual Fields are full. Pupils are equal, round, and reactive to light.   III,IV, VI: EOMI without ptosis or diploplia. V: Facial sensation is symmetric to temperature VII: Facial  movement is symmetric. VIII: hearing is intact to voice X: Uvula elevates symmetrically XI: Shoulder shrug is symmetric. XII: tongue is midline without atrophy or fasciculations. Motor: Tone is normal. Bulk is normal. He has good shoulder abduction, some elbow flexion bilaterally,0/5 bilateral bilat LE throughout Sensory: He has absent temperature to the C5 level, preserved proprioception and fine discriminatory touch. Deep Tendon Reflexes: Diminished throughout Cerebellar: Unable to perform  ASSESSMENT/PLAN [name] is a 76 y.o. 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 Differential for spinal cord symptoms may also include myelitis, MS, Sarcoidosis.   Code Stroke CT head  No acute abnormality.  MRI   6 mm acute left perirolandic infarct. Mild chronic small vessel ischemic disease. MR Cervical Spine Lesion involving the T1 vertebral body and right-sided posterior elements with associated pathologic vertebral fracture concerning for metastatic disease or other neoplasm. 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 Abnormal T2 hyperintensity involving the central and ventral aspects of the spinal cord from C5 into the included upper thoracic spine. Considerations include cord infarct, transverse myelitis, viral myelitis, and other inflammatory processes such as  sarcoidosis. No enhancement or masslike finding to suggest intramedullary cord tumor although thoracic spine imaging is recommended for further evaluation. Disc and facet degeneration resulting in severe multilevel neural foraminal stenosis as above. Mild-to-moderate spinal stenosis at C7-T1. MR  Thoracic Spine Nondiagnostic attempted diffusion-weighted imaging of the cervical and thoracic spine. Utilization of a more tailored spine MRI protocol may be helpful if this can be made available. Unchanged long segment central cervical spinal cord signal abnormality, which terminates at the T1-2 level. No signal abnormality within the remainder of the thoracic spinal cord. Unchanged appearance of possible pathologic fracture at T1. CT CAP No primary malignancy identified in the chest, abdomen or pelvis. Lucent lesion of T1 height and small superior endplate defect, nonspecific. No other focal osseous lesions. Bilateral mild perinephric stranding, nonspecific. Enlarged prostate measuring 5.8 cm transverse dimension CTA Head and Neck Minimal atherosclerosis in the head and neck without large vessel occlusion or significant proximal stenosis. Lucency in the T1 vertebral body with posterior cortical erosion and superior endplate fracture as seen on MRI. This remains more concerning for neoplasm (metastatic disease, lymphoma, myeloma) as opposed to a benign bone infarct. Carotid Doppler final read Pending Bilat LE duplex with final read Pending 2D Echo Pending May need LP when INR within normal limits LDL 180 HgbA1c 5.5 VTE prophylaxis - recommended    Diet   Diet NPO time specified  Currently on ASA  daily  Therapy recommendations:  CIR Disposition:  TBD  Blood Pressure Management Stable Permissive hypertension (OK if < 220/120) but gradually normalize in 5-7 days Long-term BP goal normotensive  Hyperlipidemia Home meds:  None  LDL 180, goal < 70 High intensity statin:  Atorvastatin   Continue statin at discharge  Other Stroke Risk Factors Advanced Age >/= 2   Other Active Problems 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 After hours, contact General Neurology

## 2020-12-08 NOTE — Consult Note (Signed)
Neurology Consultation Reason for Consult: Myelopathy Referring Physician: Darlyn Chamber  CC: Quadriplegia  History is obtained from: Patient  HPI: [name] is a 75 y.o. male with little past medical history who presents with acute quadriparesis.  He was in his normal state of health when he went to bed last night and then had shooting pain across her shoulders at about 3:30 AM.  This was transient and resolved and he went back to bed.  He then got up around 830 to 9 AM he noticed some numbness of his legs, which after he started getting up and moving progressively became worse peaking at around 2 hours.  He also notes pain that occurred at onset across his shoulders.  This is improved, but the weakness has not.  He also states that he is occasionally having difficulty with finding his words, feeling like he "is not getting enough oxygen to his brain."  He denies any antecedent symptoms any weight loss, double vision, chest pain, belly pain, or other symptoms.  LKW: 8/4 prior to bed tpa given?: no, outside of window   ROS: A 14 point ROS was performed and is negative except as noted in the HPI.    History reviewed. No pertinent past medical history.   Family History  Problem Relation Age of Onset   Dementia Mother    Heart attack Father      Social History:  has no history on file for tobacco use, alcohol use, and drug use.   Exam: Current vital signs: BP (!) 115/53   Pulse (!) 57   Temp 98.3 F (36.8 C) (Oral)   Resp 17   SpO2 96%  Vital signs in last 24 hours: Temp:  [98.3 F (36.8 C)-98.5 F (36.9 C)] 98.3 F (36.8 C) (08/05 1812) Pulse Rate:  [49-66] 57 (08/05 2315) Resp:  [8-22] 17 (08/05 2315) BP: (115-176)/(53-89) 115/53 (08/05 2315) SpO2:  [94 %-100 %] 96 % (08/05 2315)   Physical Exam  Constitutional: Appears well-developed and well-nourished.  Psych: Affect appropriate to situation Eyes: No scleral injection HENT: No OP obstruction MSK: no joint  deformities.  Cardiovascular: Normal rate and regular rhythm.  Respiratory: Effort normal, non-labored breathing GI: Soft.  No distension. There is no tenderness.  Skin: WDI  Neuro: Mental Status: Patient is awake, alert, oriented to person, place, month, year, and situation. Patient is able to give a clear and coherent history. No signs of aphasia or neglect Cranial Nerves: II: Visual Fields are full. Pupils are equal, round, and reactive to light.   III,IV, VI: EOMI without ptosis or diploplia.  V: Facial sensation is symmetric to temperature VII: Facial movement is symmetric.  VIII: hearing is intact to voice X: Uvula elevates symmetrically XI: Shoulder shrug is symmetric. XII: tongue is midline without atrophy or fasciculations.  Motor: Tone is normal. Bulk is normal.  He has good shoulder abduction, some elbow flexion bilaterally, no elbow extension, essentially 0/5 from there down including bilateral lower extremities Sensory: He has absent temperature to the C5 level, preserved proprioception and fine discriminatory touch. Deep Tendon Reflexes: Diminished throughout Cerebellar: Unable to perform    I have reviewed labs in epic and the results pertinent to this consultation are: CMP is unremarkable  I have reviewed the images obtained: MRI brain-embolic appearing left-sided stroke, as well as extensive T2 change throughout the cervical cord starting at the C5 level.  Impression: 75 year old male with acute myelopathy.  Though spinal cord stroke is typically cataclysmic in onset  rather than progressive over a couple of hours, this progression is still in keeping with what can be seen with spinal cord ischemia.  At this point, unfortunately he is not within the window for any type of thrombolytic therapy and already has T2 change in the cord itself.  Though myelitis would be the main differential, the characteristic anterior spinal artery exam coupled with the presence of  other emboli I think are strongly suggestive that this is an embolic spinal artery stroke rather than myelitis.  An LP could be considered, but unfortunately is contraindicated currently due to the elevated INR.  I think given the characteristic exam, however, if he does have restricted diffusion spinal cord without enhancement, I am not sure an LP is needed in any case.  Recommendations: 1) MRI C and T-spine with diffusion 2) lipids, A1c 3) echo, telemetry 4) ASA 81 mg daily 5) recheck INR in the morning 6) stroke team to follow   Ritta Slot, MD [facility] [phone]  If 7pm- 7am, please page neurology on call as listed in AMION.

## 2020-12-08 NOTE — Progress Notes (Signed)
VASCULAR LAB    Bilateral lower extremity venous duplex has been performed.  See CV proc for preliminary results.   Shawntee Mainwaring, RVT 12/08/2020, 3:32 PM

## 2020-12-08 NOTE — ED Notes (Signed)
Attempted to call report x 1  

## 2020-12-08 NOTE — ED Notes (Signed)
Patient transported to CT 

## 2020-12-08 NOTE — Progress Notes (Signed)
Clinical/Bedside Swallow Evaluation Patient Details  Name: [name] MRN: [id number] Date of Birth: 23-Jun-1945  Today's Date: 12/08/2020 Time: SLP Start Time (ACUTE ONLY): 1149 SLP Stop Time (ACUTE ONLY): 1220 SLP Time Calculation (min) (ACUTE ONLY): 31 min  Past Medical History: History reviewed. No pertinent past medical history. Past Surgical History: History reviewed. No pertinent surgical history. HPI:  pt is a 75 yo male adm to [facility] with acute cva - suspected spinal stroke.  Pt with diffuse weakness, bilateral upper and lower extremities.  Pt is grossly weak.  Pt CXR negative.   Assessment / Plan / Recommendation Clinical Impression  Patient presents with moderate weak cough.  He did pass the 3 ounce [facility] water screen as he needed rest break during intake.  He also presents with subtle throat clear after swallowing.  Slow but effective mastication of solids noted. Wife arrived and admits pt has throat clearing at times prior to admission.  He also reports very few episodes of coughing with thin water when consuming "too fast". Pt becomes dyspneic easily with repositioning and effort and thus recommend dys3/thin diet.  Medicine with applesauce.    Skilled intervention included educating pt and spouse to recommendations and importance of precautions especially given weak nonproductive cough.    Will follow up for tolerance, indication for instrumental evaluation and SLE.    SLP Visit Diagnosis: Dysphagia, unspecified (R13.10)    Aspiration Risk  Mild aspiration risk    Diet Recommendation Dysphagia 3 (Mech soft);Thin liquid   Liquid Administration via: Straw;Cup Medication Administration: Whole meds with puree Supervision: Full supervision/cueing for compensatory strategies Compensations: Small sips/bites;Slow rate Postural Changes: Seated upright at 90 degrees;Remain upright for at least 30 minutes after po intake    Other  Recommendations     Follow up Recommendations     tbd    Frequency and Duration     tbd       Prognosis Prognosis for Safe Diet Advancement: Good      Swallow Study   General Date of Onset: 12/07/20 HPI: pt is a 75 yo male adm to [facility] with acute cva - suspected spinal stroke.  Pt with diffuse weakness, bilateral upper and lower extremities.  Pt is grossly weak.  Pt CXR negative. Type of Study: Bedside Swallow Evaluation Diet Prior to this Study: NPO Temperature Spikes Noted: No Respiratory Status: Room air History of Recent Intubation: No Behavior/Cognition: Alert;Cooperative;Pleasant mood Oral Cavity Assessment: Within Functional Limits Oral Care Completed by SLP: Recent completion by staff Oral Cavity - Dentition: Adequate natural dentition Self-Feeding Abilities: Total assist Patient Positioning:  (partially upright, pt only able to manage sitting partially upright for approx 2 minutes prior to needing to lie back from respiratory concerns) Baseline Vocal Quality: Low vocal intensity Volitional Cough: Weak Volitional Swallow: Unable to elicit    Oral/Motor/Sensory Function Overall Oral Motor/Sensory Function: Within functional limits   Ice Chips Ice chips: (P) Not tested   Thin Liquid Thin Liquid: Impaired Pharyngeal  Phase Impairments: Throat Clearing - Delayed    Nectar Thick Nectar Thick Liquid: Within functional limits   Honey Thick Honey Thick Liquid: Not tested   Puree Puree: Within functional limits   Solid     Solid: Within functional limits      [name] 12/08/2020,12:42 PM  Jesus Infante, MS [facility] SLP Acute Rehab Services Office [phone] Pager [phone]

## 2020-12-08 NOTE — ED Notes (Signed)
Turned pt from left side to right side. Two pillows propped under pt to reduce pressure.

## 2020-12-08 NOTE — Progress Notes (Signed)
[facility] PROGRESS NOTE   [name] DTO:[id number] DOB: 08-19-1945 DOA: 12/07/2020  PCP: Pcp, No  Brief History/Interval Summary: 75 y.o. male with no known past medical history presenting with weakness.  Concern was for a neurological process.  MRI showed acute stroke.  Patient was seen by neurology.  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  Patient was hospitalized for further management.  Consultants: Neurology  Procedures: None  Antibiotics: Anti-infectives (From admission, onward)    None       Subjective/Interval History: Patient denies any headaches.  Mentions that he has not been able to move his arms or legs since yesterday.  Denies any falls or injuries.  Apparently was doing well and going for walks as recently as few days ago.    Assessment/Plan:  Acute stroke Neurology is following.  Concern is for spinal stroke.  Patient with significant weakness in bilateral upper and lower extremities.  Management per neurology.  PT and OT evaluation.  HbA1c 5.5.  LDL 180.  Will need statin before discharge.  Echocardiogram carotid Doppler pending.  Abnormal cervical MRI findings with concern for metastatic lesions T1 lesion suspicious for metastatic lesion.  CT scan of the chest abdomen pelvis does not show any obvious primary.  This lesion will eventually need to be biopsied.  We will check PSA level.  Thrombocytopenia Low platelet counts noted this morning.  Reason for this is not entirely clear.  No old labs available.  No evidence of bleeding.  Continue to monitor for now.  Avoid heparin products.  Peripheral smear.  We will do DIC panel.  Urinary retention Patient has had multiple in and out catheterization.  This is most likely due to his acute neurological issue.  Will insert Foley catheter for now.    DVT Prophylaxis: SCDs Code Status: DNR Family Communication: No family at bedside Disposition Plan: Unclear for now  Status is:  Observation  The patient will require care spanning > 2 midnights and should be moved to inpatient because: Inpatient level of care appropriate due to severity of illness  Dispo: The patient is from: Home              Anticipated d/c is to:  To be determined              Patient currently is not medically stable to d/c.   Difficult to place patient No        Medications: Scheduled: Continuous:  sodium chloride 125 mL/hr at 12/08/20 0907   XIP:[id number] **OR** acetaminophen (TYLENOL) oral liquid 160 mg/5 mL **OR** acetaminophen, senna-docusate   Objective:  Vital Signs  Vitals:   12/08/20 0907 12/08/20 1000 12/08/20 1200 12/08/20 1300  BP:  116/62 116/69 116/61  Pulse:  (!) 58 (!) 57 62  Resp:  10 16 16   Temp:      TempSrc:      SpO2:  94% 94% 96%  Weight: 72.6 kg     Height: 5' 8.5" (1.74 m)       Intake/Output Summary (Last 24 hours) at 12/08/2020 1344 Last data filed at 12/08/2020 0710 Gross per 24 hour  Intake 1000 ml  Output 1400 ml  Net -400 ml   Filed Weights   12/08/20 0907  Weight: 72.6 kg    General appearance: Awake alert.  In no distress Resp: Clear to auscultation bilaterally.  Normal effort Cardio: S1-S2 is normal regular.  No S3-S4.  No rubs murmurs or bruit GI: Abdomen is soft.  Nontender nondistended.  Bowel sounds are present normal.  No masses organomegaly Extremities: No edema.   Neurologic: Alert and oriented x3.  Quadriplegia is noted   Lab Results:  Data Reviewed: I have personally reviewed following labs and imaging studies  CBC: Recent Labs  Lab 12/07/20 1207 12/08/20 1105  WBC 8.8 8.3  NEUTROABS 7.7  --   HGB 15.2 13.5  HCT 43.1 38.6*  MCV 90.4 92.3  PLT 103* 81*    Basic Metabolic Panel: Recent Labs  Lab 12/07/20 1207  NA 135  K 4.1  CL 102  CO2 23  GLUCOSE 124*  BUN 13  CREATININE 1.00  CALCIUM 9.6    GFR: Estimated Creatinine Clearance: 64.8 mL/min (by C-G formula based on SCr of 1 mg/dL).  Liver  Function Tests: Recent Labs  Lab 12/07/20 1207  AST 30  ALT 13  ALKPHOS 53  BILITOT 2.9*  PROT 6.1*  ALBUMIN 4.0      Coagulation Profile: Recent Labs  Lab 12/07/20 1207 12/08/20 1105  INR 1.8* 1.3*    Cardiac Enzymes: Recent Labs  Lab 12/07/20 1207  CKTOTAL 82     HbA1C: Recent Labs    12/08/20 0507  HGBA1C 5.5    CBG: Recent Labs  Lab 12/07/20 1151  GLUCAP 111*    Lipid Profile: Recent Labs    12/08/20 0507  CHOL 231*  HDL 38*  LDLCALC 180*  TRIG 66  CHOLHDL 6.1      Recent Results (from the past 240 hour(s))  Resp Panel by RT-PCR (Flu A&B, Covid) Nasopharyngeal Swab     Status: None   Collection Time: 12/07/20 12:07 PM   Specimen: Nasopharyngeal Swab; Nasopharyngeal(NP) swabs in vial transport medium  Result Value Ref Range Status   SARS Coronavirus 2 by RT PCR NEGATIVE NEGATIVE Final    Comment: (NOTE) SARS-CoV-2 target nucleic acids are NOT DETECTED.  The SARS-CoV-2 RNA is generally detectable in upper respiratory specimens during the acute phase of infection. The lowest concentration of SARS-CoV-2 viral copies this assay can detect is 138 copies/mL. A negative result does not preclude SARS-Cov-2 infection and should not be used as the sole basis for treatment or other patient management decisions. A negative result may occur with  improper specimen collection/handling, submission of specimen other than nasopharyngeal swab, presence of viral mutation(s) within the areas targeted by this assay, and inadequate number of viral copies(<138 copies/mL). A negative result must be combined with clinical observations, patient history, and epidemiological information. The expected result is Negative.  Fact Sheet for Patients:  BloggerCourse.com  Fact Sheet for Healthcare Providers:  SeriousBroker.it  This test is no t yet approved or cleared by the [place] FDA and  has been  authorized for detection and/or diagnosis of SARS-CoV-2 by FDA under an Emergency Use Authorization (EUA). This EUA will remain  in effect (meaning this test can be used) for the duration of the COVID-19 declaration under Section 564(b)(1) of the Act, 21 U.S.C.section 360bbb-3(b)(1), unless the authorization is terminated  or revoked sooner.       Influenza A by PCR NEGATIVE NEGATIVE Final   Influenza B by PCR NEGATIVE NEGATIVE Final    Comment: (NOTE) The Xpert Xpress SARS-CoV-2/FLU/RSV plus assay is intended as an aid in the diagnosis of influenza from Nasopharyngeal swab specimens and should not be used as a sole basis for treatment. Nasal washings and aspirates are unacceptable for Xpert Xpress SARS-CoV-2/FLU/RSV testing.  Fact Sheet for Patients: BloggerCourse.com  Fact Sheet for  Healthcare Providers: SeriousBroker.it  This test is not yet approved or cleared by the [place] and has been authorized for detection and/or diagnosis of SARS-CoV-2 by FDA under an Emergency Use Authorization (EUA). This EUA will remain in effect (meaning this test can be used) for the duration of the COVID-19 declaration under Section 564(b)(1) of the Act, 21 U.S.C. section 360bbb-3(b)(1), unless the authorization is terminated or revoked.  Performed at [facility], [address], [place], [place] [address]       Radiology Studies: CT ANGIO HEAD NECK W WO CM  Result Date: 12/08/2020 CLINICAL DATA:  Stroke, follow-up. Acute quadriparesis. Small acute left perirolandic infarct on head MRI and suspected cervical and upper thoracic spinal cord infarct on spine MRI. EXAM: CT ANGIOGRAPHY HEAD AND NECK TECHNIQUE: Multidetector CT imaging of the head and neck was performed using the standard protocol during bolus administration of intravenous contrast. Multiplanar CT image reconstructions and MIPs were obtained to evaluate the vascular  anatomy. Carotid stenosis measurements (when applicable) are obtained utilizing NASCET criteria, using the distal internal carotid diameter as the denominator. CONTRAST:  75mL OMNIPAQUE IOHEXOL 350 MG/ML SOLN COMPARISON:  MRIs of the head, cervical spine, and thoracic spine from earlier today and yesterday FINDINGS: CT HEAD FINDINGS Brain: The subcentimeter acute left perirolandic infarct was better demonstrated on MRI. No new infarct, acute intracranial hemorrhage, mass, midline shift, or extra-axial fluid collection is identified. The ventricles and sulci are within normal limits for age. Hypodensities in the cerebral white matter bilaterally are nonspecific but compatible with mild chronic small vessel ischemic disease. Vascular: Calcified atherosclerosis at the skull base. Skull: No fracture or suspicious osseous lesion. Sinuses: Mild mucosal thickening in the paranasal sinuses. Clear mastoid air cells. Orbits: Unremarkable. Review of the MIP images confirms the above findings CTA NECK FINDINGS Aortic arch: Normal variant aortic arch branching pattern with common origin of the brachiocephalic and left common carotid arteries and with the left vertebral artery arising directly from the arch. Widely patent arch vessel origins. Right carotid system: Patent without evidence of stenosis, dissection, or significant atherosclerosis. Left carotid system: Patent without evidence of stenosis, dissection, or significant atherosclerosis. Vertebral arteries: Patent and codominant without evidence of stenosis, dissection, or significant atherosclerosis. Skeleton: Lucency in the T1 vertebral body with posterior cortical erosion and focal superior endplate fracture as seen on MRI. Other neck: No evidence of cervical lymphadenopathy or mass. Upper chest: Clear lung apices. Review of the MIP images confirms the above findings CTA HEAD FINDINGS Anterior circulation: The internal carotid arteries are patent from skull base to  carotid termini with minimal nonstenotic plaque. ACAs and MCAs are patent without evidence of a proximal branch occlusion or significant proximal stenosis. 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 No aneurysm is identified. Posterior circulation: The intracranial vertebral arteries are widely patent to the basilar. Patent left PICA, right AICA, and bilateral SCA origins are visualized. The basilar artery is widely patent. There are moderately large right and diminutive left posterior communicating arteries with hypoplasia of the right P1 segment, and there are also large anterior choroidal arteries which supply a portion of the PCA territory bilaterally. No significant proximal PCA stenosis is evident. No aneurysm is identified. Venous sinuses: Patent. Anatomic variants: As above. Review of the MIP images confirms the above findings IMPRESSION: 1. Minimal atherosclerosis in the head and neck without large vessel occlusion or significant proximal stenosis. 2. Lucency in the T1 vertebral body with posterior cortical erosion and superior endplate fracture  as seen on MRI. This remains more concerning for neoplasm (metastatic disease, lymphoma, myeloma) as opposed to a benign bone infarct. Electronically Signed   By: Sebastian Ache M.D.   On: 12/08/2020 11:24   CT HEAD WO CONTRAST  Result Date: 12/07/2020 CLINICAL DATA:  Mental status change, unknown cause. Altered mental status. EXAM: CT HEAD WITHOUT CONTRAST TECHNIQUE: Contiguous axial images were obtained from the base of the skull through the vertex without intravenous contrast. COMPARISON:  No pertinent prior exams available for comparison. FINDINGS: Brain: Cerebral volume is normal. There is no acute intracranial hemorrhage. No demarcated cortical infarct. No extra-axial fluid collection. No evidence of an intracranial mass. No midline shift. Vascular: No hyperdense vessel.  Atherosclerotic calcifications Skull: No calvarial fracture or focal suspicious osseous lesion.  Hyperostosis frontalis interna, predominantly on the left. Sinuses/Orbits: Visualized orbits show no acute finding. Mild bilateral ethmoid sinus mucosal thickening. Trace mucosal thickening within the bilateral sphenoid sinuses. Small bilateral maxillary sinus mucous retention cysts. IMPRESSION: No evidence of acute intracranial abnormality. Mild paranasal sinus disease, as described. Electronically Signed   By: Jackey Loge DO   On: 12/07/2020 13:05   MR Brain W and Wo Contrast  Result Date: 12/07/2020 CLINICAL DATA:  Neuro deficit, acute, stroke suspected.  Weakness. EXAM: MRI HEAD WITHOUT AND WITH CONTRAST TECHNIQUE: Multiplanar, multiecho pulse sequences of the brain and surrounding structures were obtained without and with intravenous contrast. CONTRAST:  7mL GADAVIST GADOBUTROL 1 MMOL/ML IV SOLN COMPARISON:  Head CTA 522 FINDINGS: Brain: There is a 6 mm acute cortical/subcortical infarct in the left perirolandic region. Scattered small T2 hyperintensities in the cerebral white matter bilaterally are nonspecific but compatible with mild chronic small vessel ischemic disease. The ventricles and sulci are within normal limits for age. No intracranial hemorrhage, mass, midline shift, extra-axial fluid collection, or abnormal enhancement is identified. Vascular: Major intracranial vascular flow voids are preserved. Skull and upper cervical spine: Unremarkable bone marrow signal. Asymmetric left frontal hyperostosis interna. Sinuses/Orbits: Unremarkable orbits. Unremarkable orbits. Mild bilateral ethmoid air cell mucosal thickening. Other: None. IMPRESSION: 1. 6 mm acute left perirolandic infarct. 2. Mild chronic small vessel ischemic disease. Electronically Signed   By: Sebastian Ache M.D.   On: 12/07/2020 18:14   MR CERVICAL SPINE WO CONTRAST  Result Date: 12/08/2020 CLINICAL DATA:  Possible cord infarct. EXAM: MRI CERVICAL AND THORACIC SPINE WITHOUT CONTRAST TECHNIQUE: Multiplanar and multiecho pulse  sequences of the cervical spine, to include the craniocervical junction and cervicothoracic junction, and the thoracic spine, were obtained without intravenous contrast. Diffusion-weighted imaging was performed. COMPARISON:  MRI cervical spine 12/07/2020 FINDINGS: MRI CERVICAL SPINE FINDINGS Findings of the cervical spine, including the long segment central cord signal abnormality, are unchanged from the earlier cervical spine MRI. Additionally, diffusion-weighted imaging was performed however, the images are of limited diagnostic value. MRI THORACIC SPINE FINDINGS Alignment:  Physiologic. Vertebrae: Unchanged possible pathologic fracture at T1. Cord: Central cord signal abnormality terminates at the T1-2 level. The remainder of the thoracic spinal cord is normal. Paraspinal and other soft tissues: Negative. Disc levels: No spinal canal or neural foraminal stenosis. Nondiagnostic diffusion-weighted imaging. IMPRESSION: 1. Nondiagnostic attempted diffusion-weighted imaging of the cervical and thoracic spine. Utilization of a more tailored spine MRI protocol may be helpful if this can be made available. 2. Unchanged long segment central cervical spinal cord signal abnormality, which terminates at the T1-2 level. No signal abnormality within the remainder of the thoracic spinal cord. 3. Unchanged appearance of possible pathologic fracture at T1.  Electronically Signed   By: Deatra RobinsonKevin  Herman M.D.   On: 12/08/2020 02:35   MR THORACIC SPINE WO CONTRAST  Result Date: 12/08/2020 CLINICAL DATA:  Possible cord infarct. EXAM: MRI CERVICAL AND THORACIC SPINE WITHOUT CONTRAST TECHNIQUE: Multiplanar and multiecho pulse sequences of the cervical spine, to include the craniocervical junction and cervicothoracic junction, and the thoracic spine, were obtained without intravenous contrast. Diffusion-weighted imaging was performed. COMPARISON:  MRI cervical spine 12/07/2020 FINDINGS: MRI CERVICAL SPINE FINDINGS Findings of the cervical  spine, including the long segment central cord signal abnormality, are unchanged from the earlier cervical spine MRI. Additionally, diffusion-weighted imaging was performed however, the images are of limited diagnostic value. MRI THORACIC SPINE FINDINGS Alignment:  Physiologic. Vertebrae: Unchanged possible pathologic fracture at T1. Cord: Central cord signal abnormality terminates at the T1-2 level. The remainder of the thoracic spinal cord is normal. Paraspinal and other soft tissues: Negative. Disc levels: No spinal canal or neural foraminal stenosis. Nondiagnostic diffusion-weighted imaging. IMPRESSION: 1. Nondiagnostic attempted diffusion-weighted imaging of the cervical and thoracic spine. Utilization of a more tailored spine MRI protocol may be helpful if this can be made available. 2. Unchanged long segment central cervical spinal cord signal abnormality, which terminates at the T1-2 level. No signal abnormality within the remainder of the thoracic spinal cord. 3. Unchanged appearance of possible pathologic fracture at T1. Electronically Signed   By: Deatra RobinsonKevin  Herman M.D.   On: 12/08/2020 02:35   MR Cervical Spine W or Wo Contrast  Result Date: 12/07/2020 CLINICAL DATA:  Demyelinating disease.  Weakness. EXAM: MRI CERVICAL SPINE WITHOUT AND WITH CONTRAST TECHNIQUE: Multiplanar and multiecho pulse sequences of the cervical spine, to include the craniocervical junction and cervicothoracic junction, were obtained without and with intravenous contrast. CONTRAST:  7mL GADAVIST GADOBUTROL 1 MMOL/ML IV SOLN COMPARISON:  None. FINDINGS: The study is motion degraded including severe motion on the axial T2 gradient echo sequence. Alignment: Reversal of the normal cervical lordosis. Trace anterolisthesis of C4 on C5 and C7 on T1. Vertebrae: Abnormal marked T1 hypointensity, STIR hyperintensity, and mild enhancement involving the marrow throughout the majority of the T1 vertebral body and extending into the right-sided  posterior elements with an associated fracture involving the superior endplate and posterior vertebral body cortex resulting in focal 40% vertebral body height loss and 3 mm retropulsion of the posterior vertebral body cortex. 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 Abnormal signal hypointensity posteriorly in the C6 and C7 vertebral bodies on the sagittal T1 postcontrast sequence without a corresponding abnormality on the other sequences to confirm an underlying lesion. Degenerative endplate changes at C5-6 and C6-7. Cord: Mild T2 hyperintensity involving the central and ventral spinal cord bilaterally from C5 into the included upper thoracic spine without enhancement. Posterior Fossa, vertebral arteries, paraspinal tissues: Unremarkable. Disc levels: C2-3: Severe right facet arthrosis result in mild-to-moderate right neural foraminal stenosis without spinal stenosis. C3-4: Moderate facet arthrosis results in mild right neural foraminal stenosis without spinal stenosis. C4-5: Anterolisthesis with bulging uncovered disc, uncovertebral spurring, and moderate right facet arthrosis result in mild spinal stenosis and moderate right neural foraminal stenosis. C5-6: Mild-to-moderate disc space narrowing. Disc bulging and uncovertebral spurring result in moderate right and severe left neural foraminal stenosis without significant spinal stenosis. C6-7: Mild-to-moderate disc space narrowing. Disc bulging, a small central disc protrusion, and uncovertebral spurring result in borderline spinal stenosis and severe right and moderate to severe left neural foraminal stenosis. C7-T1: Anterolisthesis with disc uncovering, infolding  of the ligamentum flavum, and severe facet arthrosis result in mild-to-moderate spinal stenosis and severe bilateral neural foraminal stenosis. IMPRESSION: 1. Lesion involving the T1 vertebral body and right-sided posterior elements with associated pathologic  vertebral fracture concerning for metastatic disease or other neoplasm. 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 2. Abnormal T2 hyperintensity involving the central and ventral aspects of the spinal cord from C5 into the included upper thoracic spine. Considerations include cord infarct, transverse myelitis, viral myelitis, and other inflammatory processes such as sarcoidosis. No enhancement or masslike finding to suggest intramedullary cord tumor although thoracic spine imaging is recommended for further evaluation. 3. Disc and facet degeneration resulting in severe multilevel neural foraminal stenosis as above. 4. Mild-to-moderate spinal stenosis at C7-T1. Electronically Signed   By: Sebastian Ache M.D.   On: 12/07/2020 18:45   CT CHEST ABDOMEN PELVIS W CONTRAST  Result Date: 12/08/2020 CLINICAL DATA:  Thoracic spine osseous lesion. Assessment for potential primary malignancy EXAM: CT CHEST, ABDOMEN, AND PELVIS WITH CONTRAST TECHNIQUE: Multidetector CT imaging of the chest, abdomen and pelvis was performed following the standard protocol during bolus administration of intravenous contrast. CONTRAST:  OMNIPAQUE IOHEXOL 300 MG/ML  SOLN COMPARISON:  None. FINDINGS: CT CHEST FINDINGS Cardiovascular: No significant vascular findings. Normal heart size. No pericardial effusion. Mediastinum/Nodes: No enlarged mediastinal, hilar, or axillary lymph nodes. Thyroid gland, trachea, and esophagus demonstrate no significant findings. Lungs/Pleura: Lungs are clear. No pleural effusion or pneumothorax. Musculoskeletal: Lucent lesion of T1 height and small superior endplate defect. No other focal osseous lesion. CT ABDOMEN PELVIS FINDINGS Hepatobiliary: No focal liver abnormality is seen. No gallstones, gallbladder wall thickening, or biliary dilatation. Pancreas: Unremarkable. No pancreatic ductal dilatation or surrounding inflammatory changes. Spleen: Normal in size without focal abnormality. Adrenals/Urinary Tract:  Normal adrenal glands. Bilateral mild perinephric stranding. Exophytic left renal cyst measuring 16 mm. Stomach/Bowel: Stomach is within normal limits. Appendix appears normal. No evidence of bowel wall thickening, distention, or inflammatory changes. Vascular/Lymphatic: Aortic atherosclerosis. No enlarged abdominal or pelvic lymph nodes. Reproductive: Enlarged prostate measuring 5.8 cm transverse dimension. Other: No abdominal wall hernia or abnormality. No abdominopelvic ascites. Musculoskeletal: No acute or significant osseous findings. IMPRESSION: 1. No primary malignancy identified in the chest, abdomen or pelvis. 2. Lucent lesion of T1 height and small superior endplate defect, nonspecific. No other focal osseous lesions. 3. Bilateral mild perinephric stranding, nonspecific. 4. Enlarged prostate measuring 5.8 cm transverse dimension. Aortic Atherosclerosis (ICD10-I70.0). Electronically Signed   By: Deatra Robinson M.D.   On: 12/08/2020 03:21   DG Chest Port 1 View  Result Date: 12/07/2020 CLINICAL DATA:  Altered level of consciousness. EXAM: PORTABLE CHEST 1 VIEW COMPARISON:  None. FINDINGS: The heart size and mediastinal contours are within normal limits. Both lungs are clear. The visualized skeletal structures are unremarkable. IMPRESSION: No active disease. Electronically Signed   By: Marlan Palau M.D.   On: 12/07/2020 12:19   VAS US CAROTID  Result Date: 12/08/2020 Carotid Arterial Duplex Study Patient Name:  [name]  Date of Exam:   12/08/2020 Medical Rec #: [id number]     Accession #:    [id number] Date of Birth: [birth year]     Patient Gender: M Patient Age:   19 years Exam Location:  [facility] Procedure:      VAS US CAROTID Referring Phys: Lyn Hollingshead MELVIN --------------------------------------------------------------------------------  Indications:       CVA. Other Factors:     No known past medical history. Comparison Study:  No previous exams Performing  Technologist: Ernestene Mention RVT,  RDMS  Examination Guidelines: A complete evaluation includes B-mode imaging, spectral Doppler, color Doppler, and power Doppler as needed of all accessible portions of each vessel. Bilateral testing is considered an integral part of a complete examination. Limited examinations for reoccurring indications may be performed as noted.  Right Carotid Findings: +----------+--------+--------+--------+------------------+------------------+           PSV cm/sEDV cm/sStenosisPlaque DescriptionComments           +----------+--------+--------+--------+------------------+------------------+ CCA Prox  107     0                                 intimal thickening +----------+--------+--------+--------+------------------+------------------+ CCA Distal94      0                                 intimal thickening +----------+--------+--------+--------+------------------+------------------+ ICA Prox  81      8                                                    +----------+--------+--------+--------+------------------+------------------+ ICA Distal34      8                                                    +----------+--------+--------+--------+------------------+------------------+ ECA       86      0                                                    +----------+--------+--------+--------+------------------+------------------+ +----------+--------+-------+----------------+-------------------+           PSV cm/sEDV cmsDescribe        Arm Pressure (mmHG) +----------+--------+-------+----------------+-------------------+ [id number]            Multiphasic, WNL                    +----------+--------+-------+----------------+-------------------+ +---------+--------+--+--------+-+---------+ VertebralPSV cm/s55EDV cm/s8Antegrade +---------+--------+--+--------+-+---------+  Left Carotid Findings: +----------+--------+--------+--------+------------------+------------------+            PSV cm/sEDV cm/sStenosisPlaque DescriptionComments           +----------+--------+--------+--------+------------------+------------------+ CCA Prox  103     0                                 intimal thickening +----------+--------+--------+--------+------------------+------------------+ CCA Distal108     12                                intimal thickening +----------+--------+--------+--------+------------------+------------------+ ICA Prox  59      14                                                   +----------+--------+--------+--------+------------------+------------------+ ICA Distal63  15                                                   +----------+--------+--------+--------+------------------+------------------+ ECA       102     0                                                    +----------+--------+--------+--------+------------------+------------------+ +----------+--------+--------+----------------+-------------------+           PSV cm/sEDV cm/sDescribe        Arm Pressure (mmHG) +----------+--------+--------+----------------+-------------------+ [id number]             Multiphasic, WNL                    +----------+--------+--------+----------------+-------------------+ +---------+--------+--+--------+-+---------+ VertebralPSV cm/s46EDV cm/s6Antegrade +---------+--------+--+--------+-+---------+   Summary: Right Carotid: The extracranial vessels were near-normal with only minimal wall                thickening or plaque. Left Carotid: The extracranial vessels were near-normal with only minimal wall               thickening or plaque. Vertebrals:  Bilateral vertebral arteries demonstrate antegrade flow. Subclavians: Normal flow hemodynamics were seen in bilateral subclavian              arteries. *See table(s) above for measurements and observations.     Preliminary        LOS: 0 days   Baneza Bartoszek [employer] on [email or web address]  12/08/2020, 1:44 PM

## 2020-12-08 NOTE — Evaluation (Signed)
Occupational Therapy Evaluation Patient Details Name: [name] MRN: [id number] DOB: 22-May-1945 Today's Date: 12/08/2020    History of Present Illness 75 yo male with onset of gradually increasing weakness was brought to ED.  Pt has history of being able to do yardwork the day before arrival but then global weakness occurred.  Pt reports tingling pain on R posterior neck and upper thoracic spine.  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   Differential dx now is between myelitis and spinal stroke.  PMHx:  atherosclerosis, spinal stenosis C7-T1,   Clinical Impression   Pt admitted with above. He demonstrates the below listed deficits and will benefit from continued OT to maximize safety and independence with BADLs.  Pt presents to OT with movement consistent with C5-C6 quadraplegia.  He demonstrates increased spasticity/tone bil. UEs which places him at risk for contractures.  He currently requires total A for all aspects of ADLs and functional mobility.  PTA, he lived with his wife and was fully independent PTA.  Recommend CIR     Follow Up Recommendations  CIR    Equipment Recommendations  None recommended by OT    Recommendations for Other Services Rehab consult     Precautions / Restrictions Precautions Precautions: Back Precaution Booklet Issued: No Precaution Comments: to protect for spinal stroke Restrictions Weight Bearing Restrictions: No Other Position/Activity Restrictions: protection of spine      Mobility Bed Mobility Overal bed mobility: Needs Assistance Bed Mobility: Rolling Rolling: +2 for physical assistance;+2 for safety/equipment;Total assist         General bed mobility comments: pt required total A +2 for all aspects of rolling Lt and Rt    Transfers                 General transfer comment: pt unable to safely attempt     Balance Overall balance assessment:  (unable to assess safely due to quadraparesis and pt on ED stretcher)                                         ADL either performed or assessed with clinical judgement   ADL Overall ADL's : Needs assistance/impaired                                       General ADL Comments: Pt is dependent with all aspects of ADLs     Vision Baseline Vision/History: No visual deficits Patient Visual Report: No change from baseline Additional Comments: did not assess this date     Perception Perception Perception Tested?: Yes   Praxis Praxis Praxis tested?: Within functional limits    Pertinent Vitals/Pain Pain Assessment: Faces Faces Pain Scale: Hurts even more Pain Location: R posterior neck and upper back Pain Descriptors / Indicators: Tingling;Tightness;Guarding Pain Intervention(s): Monitored during session     Hand Dominance Right   Extremity/Trunk Assessment Upper Extremity Assessment Upper Extremity Assessment: LUE deficits/detail;RUE deficits/detail RUE Deficits / Details: Pt demonstrates shoulders grossly 4-/5; elbow 3-/5; forearm 2/5; wrist extension 3-/5; tricep 0/5, hand 0/5.  Pt demontrates spasms of bil. UEs resulting in elbows flexed position and pt unable to extend elbows.  Tightness noted. RUE Sensation:  decreased proprioception RUE Coordination: decreased gross motor;decreased fine motor LUE Deficits / Details: Pt demonstrates shoulders grossly 4-/5; elbow 3-/5; forearm 2/5; wrist extension 3-/5; tricep 0/5, hand 0/5.  Pt demontrates spasms of bil. UEs resulting in elbows flexed position and pt unable to extend elbows.  Tightness noted. LUE Sensation: decreased proprioception LUE Coordination: decreased gross motor;decreased fine motor   Lower Extremity Assessment Lower Extremity Assessment: Defer to PT evaluation   Cervical / Trunk Assessment Cervical / Trunk Assessment: Other exceptions  (impaired trunk mobility and control)   Communication Communication Communication: No difficulties   Cognition Arousal/Alertness: Awake/alert Behavior During Therapy: WFL for tasks assessed/performed Overall Cognitive Status: Within Functional Limits for tasks assessed                                 General Comments: [facility] for basic testing.  Would benefit from further assessment   General Comments  pt is on gurney with generalized weakness, requiring two person help to roll and slide up in the bed.  Pt requires a hosp bed to move him and protect skin, to attempt larger mobility    Exercises Exercises: Other exercises (LLE dense weakness, RLE has trace hip ext, knee flexion and great toe ext) Other Exercises Other Exercises: PROM performed bil. UEs with focus on stretch of elbows into extension   Shoulder Instructions      Home Living Family/patient expects to be discharged to:: Inpatient rehab Living Arrangements: Spouse/significant other Available Help at Discharge: Family;Available 24 hours/day                             Additional Comments: Pt reports he lives with his wife.      Prior Functioning/Environment Level of Independence: Independent        Comments: Pt reports he was fully independent PTA        OT Problem List: Decreased strength;Decreased range of motion;Decreased activity tolerance;Impaired balance (sitting and/or standing);Decreased coordination;Decreased safety awareness;Decreased knowledge of use of DME or AE;Decreased knowledge of precautions;Impaired UE functional use;Pain;Impaired tone      OT Treatment/Interventions: Self-care/ADL training;Therapeutic exercise;Neuromuscular education;DME and/or AE instruction;Manual therapy;Splinting;Therapeutic activities;Cognitive remediation/compensation;Patient/family education;Balance training    OT Goals(Current goals can be found in the care plan section) Acute Rehab OT  Goals Patient Stated Goal: to be able to move OT Goal Formulation: With patient Time For Goal Achievement: 12/22/20 Potential to Achieve Goals: Good ADL Goals Pt Will Perform Eating: with mod assist;with adaptive utensils;bed level;sitting Pt Will Perform Grooming: with max assist;with adaptive equipment;sitting;bed level Additional ADL Goal #1: Pt will tolerate EOB sitting x 10 mins with VSS in prep for functional transfers Additional ADL Goal #2: Pt will be able to instruct caregivers on pressure relief and positioning Additional ADL Goal #3: Pt will tolerate splint wear bil. UEs without signs of pressure or pain  OT Frequency: Min 2X/week   Barriers to D/C:            Co-evaluation PT/OT/SLP Co-Evaluation/Treatment: Yes Reason for Co-Treatment: To address functional/ADL transfers   OT goals addressed during session: Strengthening/ROM      AM-PAC OT "6 Clicks" Daily Activity     Outcome Measure Help from another person eating meals?: Total Help from another person taking care of personal grooming?: Total Help from another person toileting, which includes using toliet, bedpan, or urinal?: Total Help from another person bathing (including washing,  rinsing, drying)?: Total Help from another person to put on and taking off regular upper body clothing?: Total Help from another person to put on and taking off regular lower body clothing?: Total 6 Click Score: 6   End of Session Nurse Communication: Mobility status  Activity Tolerance: Patient tolerated treatment well Patient left: in bed;with call bell/phone within reach;with family/visitor present  OT Visit Diagnosis: Muscle weakness (generalized) (M62.81) (quadraparesis)                Time: 1601-0932 OT Time Calculation (min): 29 min Charges:  OT General Charges $OT Visit: 1 Visit OT Evaluation $OT Eval Moderate Complexity: 1 Mod  Nashalie Sallis C., OTR/L Acute Rehabilitation Services Pager [phone] Office  [phone]   Jeani Hawking M 12/08/2020, 2:56 PM

## 2020-12-08 NOTE — Progress Notes (Signed)
SLP Cancellation Note  Patient Details Name: [name] MRN: [id number] DOB: 06-21-1945   Cancelled treatment:       Reason Eval/Treat Not Completed: (P) Patient at procedure or test/unavailable (pt having echo done at this time, will continue efforts)   Chales Abrahams 12/08/2020, 9:19 AM

## 2020-12-08 NOTE — Plan of Care (Signed)
  Problem: Safety: Goal: Non-violent Restraint(s) Outcome: Progressing   

## 2020-12-08 NOTE — ED Notes (Signed)
Gave pt soft call bell. Explained how to use to him and wife.

## 2020-12-08 NOTE — Progress Notes (Signed)
Inpatient Rehab Admissions Coordinator Note:   Per therapy recommendations, pt was screened for CIR candidacy by Hallie Ishida, MS CCC-SLP. At this time, Pt. Appears to have functional decline and is a potential  candidate for CIR. Will place order for rehab consult per protocol.  Please contact me with questions.   Alverda Nazzaro, MS, CCC-SLP Rehab Admissions Coordinator  [phone] (celll) [phone] (office)  

## 2020-12-08 NOTE — Progress Notes (Signed)
Carotid duplex has been completed  Results can be found under chart review under CV PROC. 12/08/2020 10:50 AM Sterling Mondo RVT, RDMS

## 2020-12-09 ENCOUNTER — Other Ambulatory Visit (HOSPITAL_COMMUNITY): Payer: Federal, State, Local not specified - PPO

## 2020-12-09 ENCOUNTER — Inpatient Hospital Stay (HOSPITAL_COMMUNITY): Payer: Medicare Other

## 2020-12-09 ENCOUNTER — Encounter (HOSPITAL_COMMUNITY): Payer: Self-pay | Admitting: Internal Medicine

## 2020-12-09 DIAGNOSIS — I639 Cerebral infarction, unspecified: Secondary | ICD-10-CM | POA: Diagnosis not present

## 2020-12-09 DIAGNOSIS — I6389 Other cerebral infarction: Secondary | ICD-10-CM

## 2020-12-09 DIAGNOSIS — I6340 Cerebral infarction due to embolism of unspecified cerebral artery: Principal | ICD-10-CM

## 2020-12-09 DIAGNOSIS — N400 Enlarged prostate without lower urinary tract symptoms: Secondary | ICD-10-CM

## 2020-12-09 LAB — CBC WITH DIFFERENTIAL/PLATELET
Abs Immature Granulocytes: 0.05 10*3/uL (ref 0.00–0.07)
Basophils Absolute: 0 10*3/uL (ref 0.0–0.1)
Basophils Relative: 0 %
Eosinophils Absolute: 0 10*3/uL (ref 0.0–0.5)
Eosinophils Relative: 0 %
HCT: 34.8 % — ABNORMAL LOW (ref 39.0–52.0)
Hemoglobin: 12.3 g/dL — ABNORMAL LOW (ref 13.0–17.0)
Immature Granulocytes: 1 %
Lymphocytes Relative: 4 %
Lymphs Abs: 0.4 10*3/uL — ABNORMAL LOW (ref 0.7–4.0)
MCH: 32.5 pg (ref 26.0–34.0)
MCHC: 35.3 g/dL (ref 30.0–36.0)
MCV: 91.8 fL (ref 80.0–100.0)
Monocytes Absolute: 1.2 10*3/uL — ABNORMAL HIGH (ref 0.1–1.0)
Monocytes Relative: 12 %
Neutro Abs: 8.2 10*3/uL — ABNORMAL HIGH (ref 1.7–7.7)
Neutrophils Relative %: 83 %
Platelets: 75 10*3/uL — ABNORMAL LOW (ref 150–400)
RBC: 3.79 MIL/uL — ABNORMAL LOW (ref 4.22–5.81)
RDW: 13.2 % (ref 11.5–15.5)
WBC: 9.9 10*3/uL (ref 4.0–10.5)
nRBC: 0 % (ref 0.0–0.2)
nRBC: 0 /100 WBC

## 2020-12-09 LAB — BILIRUBIN, FRACTIONATED(TOT/DIR/INDIR)
Bilirubin, Direct: 0.4 mg/dL — ABNORMAL HIGH (ref 0.0–0.2)
Indirect Bilirubin: 1.8 mg/dL — ABNORMAL HIGH (ref 0.3–0.9)
Total Bilirubin: 2.2 mg/dL — ABNORMAL HIGH (ref 0.3–1.2)

## 2020-12-09 LAB — CBC
HCT: 34.7 % — ABNORMAL LOW (ref 39.0–52.0)
Hemoglobin: 11.9 g/dL — ABNORMAL LOW (ref 13.0–17.0)
MCH: 31.4 pg (ref 26.0–34.0)
MCHC: 34.3 g/dL (ref 30.0–36.0)
MCV: 91.6 fL (ref 80.0–100.0)
Platelets: 78 10*3/uL — ABNORMAL LOW (ref 150–400)
RBC: 3.79 MIL/uL — ABNORMAL LOW (ref 4.22–5.81)
RDW: 13.2 % (ref 11.5–15.5)
WBC: 8.8 10*3/uL (ref 4.0–10.5)
nRBC: 0 % (ref 0.0–0.2)

## 2020-12-09 LAB — ECHOCARDIOGRAM COMPLETE
AR max vel: 3.9 cm2
AV Area VTI: 3.9 cm2
AV Area mean vel: 3.84 cm2
AV Mean grad: 3 mmHg
AV Peak grad: 6.7 mmHg
AV Vena cont: 0.2 cm
Ao pk vel: 1.29 m/s
Area-P 1/2: 3.3 cm2
Calc EF: 64.5 %
Height: 68.5 in
MV VTI: 3.69 cm2
S' Lateral: 3.5 cm
Single Plane A2C EF: 76.6 %
Single Plane A4C EF: 55.8 %
Weight: 2560 oz

## 2020-12-09 LAB — TSH: TSH: 1.128 u[IU]/mL (ref 0.350–4.500)

## 2020-12-09 LAB — RETICULOCYTES
Immature Retic Fract: 7.8 % (ref 2.3–15.9)
RBC.: 3.7 MIL/uL — ABNORMAL LOW (ref 4.22–5.81)
Retic Count, Absolute: 55.9 10*3/uL (ref 19.0–186.0)
Retic Ct Pct: 1.5 % (ref 0.4–3.1)

## 2020-12-09 LAB — COMPREHENSIVE METABOLIC PANEL
ALT: 20 U/L (ref 0–44)
AST: 40 U/L (ref 15–41)
Albumin: 2.8 g/dL — ABNORMAL LOW (ref 3.5–5.0)
Alkaline Phosphatase: 70 U/L (ref 38–126)
Anion gap: 6 (ref 5–15)
BUN: 20 mg/dL (ref 8–23)
CO2: 19 mmol/L — ABNORMAL LOW (ref 22–32)
Calcium: 8.2 mg/dL — ABNORMAL LOW (ref 8.9–10.3)
Chloride: 111 mmol/L (ref 98–111)
Creatinine, Ser: 0.88 mg/dL (ref 0.61–1.24)
GFR, Estimated: >60 mL/min (ref >60)
Glucose, Bld: 124 mg/dL — ABNORMAL HIGH (ref 70–99)
Potassium: 4.1 mmol/L (ref 3.5–5.1)
Sodium: 136 mmol/L (ref 135–145)
Total Bilirubin: 2.2 mg/dL — ABNORMAL HIGH (ref 0.3–1.2)
Total Protein: 4.6 g/dL — ABNORMAL LOW (ref 6.5–8.1)

## 2020-12-09 LAB — SAVE SMEAR(SSMR), FOR PROVIDER SLIDE REVIEW

## 2020-12-09 LAB — DIRECT ANTIGLOBULIN TEST (NOT AT ARMC)
DAT, IgG: NEGATIVE
DAT, complement: NEGATIVE

## 2020-12-09 LAB — PSA: Prostatic Specific Antigen: 67.79 ng/mL — ABNORMAL HIGH (ref 0.00–4.00)

## 2020-12-09 LAB — PROTIME-INR
INR: 1.2 (ref 0.8–1.2)
Prothrombin Time: 14.9 seconds (ref 11.4–15.2)

## 2020-12-09 LAB — FOLATE: Folate: 11.1 ng/mL (ref >5.9)

## 2020-12-09 LAB — VITAMIN B12: Vitamin B-12: 378 pg/mL (ref 180–914)

## 2020-12-09 MED ORDER — CLOPIDOGREL BISULFATE 75 MG PO TABS
75.0000 mg | ORAL_TABLET | Freq: Every day | ORAL | Status: DC
  Administered 2020-12-10 – 2020-12-13 (×4): 75 mg via ORAL
  Filled 2020-12-09 (×4): qty 1

## 2020-12-09 NOTE — Progress Notes (Addendum)
  Speech Language Pathology Treatment: Dysphagia  Patient Details Name: Charleston Hankin MRN: [id number] DOB: August 20, 1945 Today's Date: 12/09/2020 Time: 4097-3532 SLP Time Calculation (min) (ACUTE ONLY): 32 min  Assessment / Plan / Recommendation Clinical Impression  Skilled SLP follow up indicated re: pt's swallowing function and airway protection.  Minimal verbal cues to sit as upright as able, maintain neutral status with his head and take small single boluses.    Pt with minimal intake yesterday - including a few bites of sandwich, applesauce and water only.  Therefore SLP ordered his breakfast for him.   Pt advised he did "get choked" yesterday in the ED department when drinking "too fast" with water when given by wife.  He acknowledged it was uncomfortable when this occured and he is using extra caution with intake.  Recommend continue use of straws to allow improved control of administration.    As pt has a high level of education, using teach back, he verbalized understanding of importance of aspiration precautions after SLP reiterated increased aspiration pneumonia risk due to excessively weak cough from his spinal cord involvement.  He is in agreement to continue dys3 diet to maximize energy conservation as he admits he "runs out of breath" at times even with talking.    Regarding energy conservation for speech - Advised he chunk information, have listeners turn off background noise (tv, close door, etc) in communicating with him to decrease extraneous noise.  Also recommended consider obtaining cell phone with headphone/microphone set to try to use for calling family, etc via his cell.     SLP assisted pt to call his family during session given his lack of access to cell phone and wife's cell number being long distance.    Swallow precaution signs posted in room - above bed and within pt's reach.  SLP will follow up closely for swallowing management to mitigate aspiration, monitor vitals  *lung sounds, temperatures, WBC, etc* to assure pt is tolerating po intake.    HPI   pt is a 75 yo male adm to [facility] with acute cva - suspected spinal stroke.  Pt with diffuse weakness, bilateral upper and lower extremities.  Pt is grossly weak.  Pt CXR negative.  Platelet count abnormal per MD report to pt and extra tests are being conducted with hematology consult per Attending.  SLP deferred speech/language evaluation, given pt denies cognitive changes and demonstrates intact language during today's session. His primary concerns are his motility of his limbs.     SLP Plan  Continue with current plan of care       Recommendations  Diet recommendations: Dysphagia 3 (mechanical soft);Thin liquid Liquids provided via: Straw Medication Administration: Whole meds with puree Supervision: Full supervision/cueing for compensatory strategies Compensations: Small sips/bites;Slow rate Postural Changes and/or Swallow Maneuvers: Seated upright 90 degrees;Upright 30-60 min after meal                Oral Care Recommendations: Oral care BID Follow up Recommendations: Other (comment) (TBD) SLP Visit Diagnosis: Dysphagia, unspecified (R13.10) Plan: Continue with current plan of care       GO              Rolena Infante, MS [facility] SLP Acute Rehab Services Office [phone] Pager [phone]   Chales Abrahams 12/09/2020, 10:44 AM

## 2020-12-09 NOTE — Progress Notes (Signed)
*  PRELIMINARY RESULTS* Echocardiogram 2D Echocardiogram has been performed.  Neomia Dear RDCS 12/09/2020, 11:58 AM

## 2020-12-09 NOTE — Plan of Care (Signed)
Problem: Safety: Goal: Non-violent Restraint(s) 12/09/2020 1630 by Jerral Ralph, RN Outcome: Progressing 12/09/2020 1630 by Jerral Ralph, RN Outcome: Progressing 12/09/2020 1621 by Jerral Ralph, RN Outcome: Progressing   Problem: Education: Goal: Knowledge of General Education information will improve Description: Including pain rating scale, medication(s)/side effects and non-pharmacologic comfort measures 12/09/2020 1630 by Jerral Ralph, RN Outcome: Progressing 12/09/2020 1630 by Jerral Ralph, RN Outcome: Progressing 12/09/2020 1621 by Jerral Ralph, RN Outcome: Progressing   Problem: Health Behavior/Discharge Planning: Goal: Ability to manage health-related needs will improve 12/09/2020 1630 by Jerral Ralph, RN Outcome: Progressing 12/09/2020 1630 by Jerral Ralph, RN Outcome: Progressing 12/09/2020 1621 by Jerral Ralph, RN Outcome: Progressing   Problem: Clinical Measurements: Goal: Ability to maintain clinical measurements within normal limits will improve 12/09/2020 1630 by Jerral Ralph, RN Outcome: Progressing 12/09/2020 1630 by Jerral Ralph, RN Outcome: Progressing 12/09/2020 1621 by Jerral Ralph, RN Outcome: Progressing Goal: Will remain free from infection 12/09/2020 1630 by Jerral Ralph, RN Outcome: Progressing 12/09/2020 1630 by Jerral Ralph, RN Outcome: Progressing 12/09/2020 1621 by Jerral Ralph, RN Outcome: Progressing Goal: Diagnostic test results will improve 12/09/2020 1630 by Jerral Ralph, RN Outcome: Progressing 12/09/2020 1630 by Jerral Ralph, RN Outcome: Progressing 12/09/2020 1621 by Jerral Ralph, RN Outcome: Progressing Goal: Respiratory complications will improve 12/09/2020 1630 by Jerral Ralph, RN Outcome: Progressing 12/09/2020 1630 by Jerral Ralph, RN Outcome: Progressing 12/09/2020 1621 by Jerral Ralph, RN Outcome: Progressing Goal: Cardiovascular complication will be avoided 12/09/2020 1630 by Jerral Ralph, RN Outcome:  Progressing 12/09/2020 1630 by Jerral Ralph, RN Outcome: Progressing 12/09/2020 1621 by Jerral Ralph, RN Outcome: Progressing   Problem: Activity: Goal: Risk for activity intolerance will decrease 12/09/2020 1630 by Jerral Ralph, RN Outcome: Progressing 12/09/2020 1630 by Jerral Ralph, RN Outcome: Progressing 12/09/2020 1621 by Jerral Ralph, RN Outcome: Progressing   Problem: Nutrition: Goal: Adequate nutrition will be maintained 12/09/2020 1630 by Jerral Ralph, RN Outcome: Progressing 12/09/2020 1630 by Jerral Ralph, RN Outcome: Progressing 12/09/2020 1621 by Jerral Ralph, RN Outcome: Progressing   Problem: Coping: Goal: Level of anxiety will decrease 12/09/2020 1630 by Jerral Ralph, RN Outcome: Progressing 12/09/2020 1630 by Jerral Ralph, RN Outcome: Progressing 12/09/2020 1621 by Jerral Ralph, RN Outcome: Progressing   Problem: Elimination: Goal: Will not experience complications related to bowel motility 12/09/2020 1630 by Jerral Ralph, RN Outcome: Progressing 12/09/2020 1630 by Jerral Ralph, RN Outcome: Progressing 12/09/2020 1621 by Jerral Ralph, RN Outcome: Progressing Goal: Will not experience complications related to urinary retention 12/09/2020 1630 by Jerral Ralph, RN Outcome: Progressing 12/09/2020 1630 by Jerral Ralph, RN Outcome: Progressing 12/09/2020 1621 by Jerral Ralph, RN Outcome: Progressing   Problem: Pain Managment: Goal: General experience of comfort will improve 12/09/2020 1630 by Jerral Ralph, RN Outcome: Progressing 12/09/2020 1630 by Jerral Ralph, RN Outcome: Progressing 12/09/2020 1621 by Jerral Ralph, RN Outcome: Progressing   Problem: Safety: Goal: Ability to remain free from injury will improve 12/09/2020 1630 by Jerral Ralph, RN Outcome: Progressing 12/09/2020 1630 by Jerral Ralph, RN Outcome: Progressing 12/09/2020 1621 by Jerral Ralph, RN Outcome: Progressing   Problem: Skin Integrity: Goal: Risk for impaired skin  integrity will decrease 12/09/2020 1630 by Jerral Ralph, RN Outcome: Progressing 12/09/2020 1630 by Jerral Ralph, RN Outcome: Progressing 12/09/2020 1621 by  Jerral Ralph, RN Outcome: Progressing

## 2020-12-09 NOTE — Plan of Care (Signed)

## 2020-12-09 NOTE — Progress Notes (Addendum)
STROKE TEAM PROGRESS NOTE   ATTENDING NOTE: I reviewed above note and agree with the assessment and plan. Pt was seen and examined.   RN is at bedside.  Patient lying in bed, bilateral upper extremity some improvement of muscle strength, especially bilateral shoulder with deltoid 3/5, wrist extension and flexion 3/5, still has difficulty moving fingers, bicep and tricep 1/5 with increased muscle tone.  Bilateral lower extremity flaccid, no movement.  Reflex to diminished throughout except of lateral bicep 2/5.  Light touch sensation decreased from T4 level, however proprioception sensations preserved.  EF 60 to 65%.  Discussed with patient that given his time course from walking to quadriparesis in 30 minutes, it is almost certain that his symptoms are due to spinal cord infarct.  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  INR 1.2, still has thrombocytopenia platelets 75 today.  Discussed with patient and also I discussed with Dr. Luisa HartPatrick, LP will not be needed at this time.  We will start DAPT and continue statin.  Patient likely need loop recorder to rule out A. fib.  PT/OT recommend CIR.  We will follow.  For detailed assessment and plan, please refer to above as I have made changes wherever appropriate.   Marvel PlanJindong Sharyah Bostwick, MD PhD Stroke Neurology 12/09/2020 6:43 PM    Brief history:  75 year old male with no known significant PMH presented to ER for acute onset bilateral shoulder pain followed by quadriparesis.  Per patient and wife, Friday early morning at 3:30 AM, patient complaining of bilateral shoulder pain, between shoulder blade pain.  In the morning, he got up, walked to the kitchen making coffee, he was able to walk with no obvious difficulty.  After making coffee, he felt whole-body weakness, bilateral shoulder pain spreading to left chest.  She went back to bed, and wife called 911  about 15 to 20 minutes later.  On EMS arrival, patient tried to get up to go to ambulance, he felt significant lower extremity weakness, not able to hold up, he fell. He has bilateral upper extremity also in flexion position, feeling weak. The symptoms develop from normal baseline to quadriplegia in about 30 minutes.    INTERVAL HISTORY No acute events. No visitors at bedside.  Patient is resting in bed with no new concerns. He feels his arms are slightly stronger today. He is frustrated and a bit tearful about needing assistance for his needs. Reassurance was provided. We discussed his current plan of care including newest results and plan to hold off on LP for present in setting of thrombocytopenia. His questions were answered. Discussed need with Jonny RuizJohn, RN, and orders placed for soft touch call light, strict turning schedule, heel protectors, ongoing skin care assessments, bed near to nurse's station, and feeding assistance requirements.   Vitals:   12/08/20 1948 12/08/20 2326 12/09/20 0320 12/09/20 0742  BP: (!) 118/57 (!) 107/56 (!) 107/59 121/62  Pulse: (!) 58 (!) 58 (!) 57 61  Resp: 17 15 14 18   Temp: 97.9 F (36.6 C) 97.9 F (36.6 C) 97.9 F (36.6 C) 97.8 F (36.6 C)  TempSrc: Oral Oral Oral Oral  SpO2: 98% 97% 96% 96%  Weight:      Height:       CBC:  Recent Labs  Lab 12/07/20 1207 12/08/20 1105 12/09/20 0113 12/09/20 0948  WBC 8.8   < > 8.8 9.9  NEUTROABS 7.7  --   --  8.2*  HGB 15.2   < > 11.9* 12.3*  HCT 43.1   < > 34.7* 34.8*  MCV 90.4   < > 91.6 91.8  PLT 103*   < > 78* 75*   < > = values in this interval not displayed.   Basic Metabolic Panel:  Recent Labs  Lab 12/07/20 1207 12/09/20 0113  NA 135 136  K 4.1 4.1  CL 102 111  CO2 23 19*  GLUCOSE 124* 124*  BUN 13 20  CREATININE 1.00 0.88  CALCIUM 9.6 8.2*   Lipid Panel:  Recent Labs  Lab 12/08/20 0507  CHOL 231*  TRIG 66  HDL 38*  CHOLHDL 6.1  VLDL 13  LDLCALC 732*   HgbA1c:  Recent Labs   Lab 12/08/20 0507  HGBA1C 5.5   Urine Drug Screen:  Recent Labs  Lab 12/07/20 2210  LABOPIA NONE DETECTED  COCAINSCRNUR NONE DETECTED  LABBENZ NONE DETECTED  AMPHETMU NONE DETECTED  THCU NONE DETECTED  LABBARB NONE DETECTED    Alcohol Level  Recent Labs  Lab 12/07/20 1136  ETH <10    IMAGING past 24 hours VAS [place] LOWER EXTREMITY VENOUS (DVT)  Result Date: 12/08/2020  Lower Venous DVT Study Patient Name:  LONDON TARNOWSKI  Date of Exam:   12/08/2020 Medical Rec #: [id number]     Accession #:    [id number] Date of Birth: Jun 12, 1945     Patient Gender: M Patient Age:   89 years Exam Location:  [facility] Procedure:      VAS [place] LOWER EXTREMITY VENOUS (DVT) Referring Phys: Scheryl Marten Keisy Strickler --------------------------------------------------------------------------------  Indications: Stroke.  Limitations: Positioning and poor ultrasound/tissue interface. Comparison Study: No prior study on file Performing Technologist: Sherren Kerns RVS  Examination Guidelines: A complete evaluation includes B-mode imaging, spectral Doppler, color Doppler, and power Doppler as needed of all accessible portions of each vessel. Bilateral testing is considered an integral part of a complete examination. Limited examinations for reoccurring indications may be performed as noted. The reflux portion of the exam is performed with the patient in reverse Trendelenburg.  +---------+---------------+---------+-----------+----------+--------------+ RIGHT    CompressibilityPhasicitySpontaneityPropertiesThrombus Aging +---------+---------------+---------+-----------+----------+--------------+ CFV      Full           Yes      Yes                                 +---------+---------------+---------+-----------+----------+--------------+ SFJ      Full                                                        +---------+---------------+---------+-----------+----------+--------------+ FV Prox  Full                                                         +---------+---------------+---------+-----------+----------+--------------+ FV Mid   Full                                                        +---------+---------------+---------+-----------+----------+--------------+  FV DistalFull                                                        +---------+---------------+---------+-----------+----------+--------------+ PFV      Full                                                        +---------+---------------+---------+-----------+----------+--------------+ POP      Full           Yes      Yes                                 +---------+---------------+---------+-----------+----------+--------------+ PTV      Full                                                        +---------+---------------+---------+-----------+----------+--------------+ PERO     Full                                                        +---------+---------------+---------+-----------+----------+--------------+   +---------+---------------+---------+-----------+----------+--------------+ LEFT     CompressibilityPhasicitySpontaneityPropertiesThrombus Aging +---------+---------------+---------+-----------+----------+--------------+ CFV      Full           Yes      Yes                                 +---------+---------------+---------+-----------+----------+--------------+ SFJ      Full                                                        +---------+---------------+---------+-----------+----------+--------------+ FV Prox  Full                                                        +---------+---------------+---------+-----------+----------+--------------+ FV Mid   Full                                                        +---------+---------------+---------+-----------+----------+--------------+ FV DistalFull                                                         +---------+---------------+---------+-----------+----------+--------------+  PFV      Full                                                        +---------+---------------+---------+-----------+----------+--------------+ POP      Full           Yes      Yes                                 +---------+---------------+---------+-----------+----------+--------------+ PTV      Full                                                        +---------+---------------+---------+-----------+----------+--------------+ PERO     Full                                                        +---------+---------------+---------+-----------+----------+--------------+     Summary: BILATERAL: - No evidence of deep vein thrombosis seen in the lower extremities, bilaterally. -No evidence of popliteal cyst, bilaterally.   *See table(s) above for measurements and observations.    Preliminary     PHYSICAL EXAM Constitutional: Appears well-developed and well-nourished. Psych: Affect appropriate to situation MSK: no joint deformities. Cardiovascular: Normal rate and regular rhythm. Respiratory: Effort normal, non-labored breathing Skin: Warm, dry and intact    Neuro: Mental Status: Patient is awake, alert, oriented to person, place, month, year, and situation. Patient is able to give a clear and coherent history. No signs of aphasia or neglect Cranial Nerves: II: Visual Fields are full. Pupils are equal, round, and reactive to light.   III,IV, VI: EOMI without ptosis or diploplia. V: Facial sensation is symmetric to temperature VII: Facial movement is symmetric. VIII: hearing is intact to voice X: Uvula elevates symmetrically XI: Shoulder shrug is symmetric. XII: tongue is midline without atrophy or fasciculations. Motor: Tone is normal. Bulk is normal. Bilateral upper extremities in flexion position with increased tone, bilateral proximal 2/5. Distal 1/5. 0/5 bilateral bilat LE  throughout. Sensory: He has absent temperature to the C5 level, preserved proprioception and fine discriminatory touch. Deep Tendon Reflexes: Diminished throughout Cerebellar: Unable to perform  ASSESSMENT/PLAN [name] is a 75 y.o. 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 Less likely differential for spinal cord symptoms may also include myelitis, MS, Sarcoidosis.   Code Stroke CT head  No acute abnormality.  MRI   6 mm acute left perirolandic infarct. Mild chronic small vessel ischemic disease. MR Cervical Spine Lesion involving the T1 vertebral body and right-sided posterior elements with associated pathologic vertebral fracture concerning for metastatic disease or other neoplasm. 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 Abnormal T2 hyperintensity involving the central and ventral aspects of the spinal cord from C5 into the included upper thoracic spine. Considerations include cord infarct, transverse myelitis, viral myelitis, and other inflammatory processes such as sarcoidosis. No enhancement or masslike finding to suggest intramedullary cord tumor although thoracic spine imaging is recommended for further evaluation. Disc and facet degeneration resulting in severe multilevel neural foraminal stenosis as above. Mild-to-moderate spinal stenosis at C7-T1. MR  Thoracic Spine Nondiagnostic attempted diffusion-weighted imaging of the cervical and thoracic spine. Utilization of a more tailored spine MRI protocol may be helpful if this can be made available. Unchanged long segment central cervical spinal cord  signal abnormality, which terminates at the T1-2 level. No signal abnormality within the remainder of the thoracic spinal cord. Unchanged appearance of possible pathologic fracture at T1. CT CAP No primary malignancy identified in the chest, abdomen or pelvis. Lucent lesion of T1 height and small superior endplate defect, nonspecific. No other focal osseous lesions. Bilateral mild perinephric stranding, nonspecific. Enlarged prostate measuring 5.8 cm transverse dimension CTA Head and Neck Minimal atherosclerosis in the head and neck without large vessel occlusion or significant proximal stenosis. Lucency in the T1 vertebral body with posterior cortical erosion and superior endplate fracture as seen on MRI. This remains more concerning for neoplasm (metastatic disease, lymphoma, myeloma) as opposed to a benign bone infarct. Carotid Doppler final read Pending Bilat LE duplex with final read Pending 2D Echo: EF 60-65%, No thrombus, wall motion abnormality or shunt found.   May need LP when INR/thrombocytopenia within normal limits Recommend 30-day Cardic event monitor versus loop recorder to rule out cardioembolic source. LDL 180 HgbA1c 5.5 VTE prophylaxis - recommended    Diet   DIET DYS 3 Room service appropriate? Yes; Fluid consistency: Thin  Currently on ASA 81mg  daily. Avoid DAPT at this time in case patient needed LP and CSF analysis.   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 After hours, contact General Neurology

## 2020-12-09 NOTE — Consult Note (Signed)
[facility] CONSULT NOTE  Patient Care Team: Pcp, No as PCP - General   ASSESSMENT & PLAN Mild progressive pancytopenia I believe there is a component of hemodilution Other potential rare causes would be copper deficiency in the setting of zinc supplementation but this would be very rare I suspect, with bone lesion seen on MRI, elevated PSA and abnormal enlarged prostate, it raised the possibility whether the patient might have metastatic prostate cancer to the bone causing pancytopenia In order to work this up, I recommend bone scan The patient and family members are devastated by his clinical condition and at this point in time, requested some time to think about further work-up I am concerned about the role of anticoagulation therapy in the setting of thrombocytopenia At this point, with his platelet count over 50,000, he can proceed and continue on anticoagulation therapy to prevent another infarct but without further work-up, it could potentially get worse This is not TTP or ITP  Mild coagulopathy, resolved The cause is unknown  Indirect hyperbilirubinemia This has been stable I wonder if he might have Gilbert syndrome There are no signs of liver disease on CT imaging of his liver Observe only  Multifocal infarct and quadriplegia I suspect this is thrombotic in nature, likely due to untreated malignancy, at this point in time, the most likely diagnosis would be potential metastatic prostate cancer He will need aggressive rehab  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 His PSA is elevated He has enlarged prostate on CT imaging and bone lesion on MRI It could be possible that he might have metastatic prostate cancer to the bone Treatment for metastatic prostate cancer in general is not debilitating but with his current neurological deficit, it could be challenging  Goals of care discussion His wife wonders the utility and benefits of further testing if  treatment cannot restore his neurological deficit While treatment may not restore his neurological deficit, with aggressive rehab, I am hopeful he can regain some function down the road I would defer further discussion with primary service and neurologist  At this point in time, I will sign off If the patient or family members are interested to pursue more testing, I would recommend we start with a bone scan to scan for evidence of metastatic disease and to consider bone marrow aspirate and biopsy to complete evaluation of his pancytopenia  Artis Delay, MD 12/09/2020 4:45 PM  CHIEF COMPLAINTS/PURPOSE OF CONSULTATION:  Acute stroke, thrombocytopenia, progressive anemia  HISTORY OF PRESENTING ILLNESS:  [name] 75 y.o. male is seen at the request from hospitalist.  Initially, his son was here. Then he traded space for visit with his mother (patient's wife) He does not see a primary care doctor.  His family member stated he does not need to see a primary care doctor because he is healthy all his life.  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  PSA was elevated today On admission,  his CBC was within normal range but in hindsight, I was wondering whether he might be dehydrated His admission hemoglobin was 15.2 with a platelet count of 103 on 12/07/2020 He received some IV fluid hydration Today, his CBC started to trend down.  Hemoglobin is reduced to 12.3 and platelet  count of 75,000 He was noted to have stable hyperbilirubinemia, predominantly indirect hyperbilirubinemia.  He is noted to have low serum total protein and albumin He has normal renal function  The patient has no warning signs prior to his quadriplegic episode. He denies recent infection, fever, chills or cough His appetite has been normal  MEDICAL HISTORY:  History reviewed. No pertinent past medical history.  SURGICAL HISTORY: Past Surgical History:  Procedure Laterality Date   TONSILLECTOMY      SOCIAL HISTORY: Social History   Socioeconomic History   Marital status: Married    Spouse name: Not on file   Number of children: Not on file   Years of education: Not on file   Highest education level: Not on file  Occupational History   Not on file  Tobacco Use   Smoking status: Never   Smokeless tobacco: Never  Vaping Use   Vaping Use: Never used  Substance and Sexual Activity   Alcohol use: Not Currently   Drug use: Not Currently   Sexual activity: Never  Other Topics Concern   Not on file  Social History Narrative   Not on file   Social Determinants of Health   Financial Resource Strain: Not on file  Food Insecurity: Not on file  Transportation Needs: Not on file  Physical Activity: Not on file  Stress: Not on file  Social Connections: Not on file  Intimate Partner Violence: Not on file    FAMILY HISTORY: Family History  Problem Relation Age of Onset   Dementia Mother    Heart attack Father     ALLERGIES:  has No Known Allergies.  MEDICATIONS:  Current Facility-Administered Medications  Medication Dose Route Frequency Provider Last Rate Last Admin   0.9 %  sodium chloride infusion   Intravenous Continuous Osvaldo Shipper, MD 75 mL/hr at 12/09/20 1301 New Bag at 12/09/20 1301   acetaminophen (TYLENOL) tablet 650 mg  650 mg Oral Q4H PRN Synetta Fail, MD   650 mg at 12/08/20 1922   Or   acetaminophen (TYLENOL) 160 MG/5ML solution 650 mg  650 mg  Per Tube Q4H PRN Synetta Fail, MD       Or   acetaminophen (TYLENOL) suppository 650 mg  650 mg Rectal Q4H PRN Synetta Fail, MD       aspirin EC tablet 81 mg  81 mg Oral Daily Marvel Plan, MD   81 mg at 12/09/20 0912   atorvastatin (LIPITOR) tablet 80 mg  80 mg Oral Daily Marvel Plan, MD   80 mg at 12/09/20 6761   Chlorhexidine Gluconate Cloth 2 % PADS 6 each  6 each Topical Daily Osvaldo Shipper, MD   6 each at 12/09/20 9509   polyvinyl alcohol (LIQUIFILM TEARS) 1.4 % ophthalmic solution 1 drop  1 drop Both Eyes PRN Marvel Plan, MD       senna-docusate (Senokot-S) tablet 1 tablet  1 tablet Oral QHS PRN Synetta Fail, MD        REVIEW OF SYSTEMS:   Constitutional: Denies fevers, chills or abnormal night sweats Eyes: Denies blurriness of vision, double vision or watery eyes Ears, nose, mouth, throat, and face: Denies mucositis  or sore throat Respiratory: Denies cough, dyspnea or wheezes Cardiovascular: Denies palpitation, chest discomfort or lower extremity swelling Gastrointestinal:  Denies nausea, heartburn or change in bowel habits Skin: Denies abnormal skin rashes Lymphatics: Denies new lymphadenopathy or easy bruising Behavioral/Psych: Mood is stable, no new changes  All other systems were reviewed with the patient and are negative.  PHYSICAL EXAMINATION: ECOG PERFORMANCE STATUS: 4 - Bedbound  Vitals:   12/09/20 0742 12/09/20 1347  BP: 121/62 126/69  Pulse: 61 69  Resp: 18 18  Temp: 97.8 F (36.6 C) 98.4 F (36.9 C)  SpO2: 96% 96%   Filed Weights   12/08/20 0907  Weight: 160 lb (72.6 kg)    GENERAL:alert, no distress and comfortable SKIN: skin color, texture, turgor are normal, no rashes or significant lesions EYES: normal, conjunctiva are pink and non-injected, sclera clear OROPHARYNX:no exudate, no erythema and lips, buccal mucosa, and tongue normal  NECK: supple, thyroid normal size, non-tender, without nodularity LYMPH:  no palpable  lymphadenopathy in the cervical, axillary or inguinal LUNGS: clear to auscultation and percussion with normal breathing effort HEART: regular rate & rhythm and no murmurs and no lower extremity edema ABDOMEN:abdomen soft, non-tender and normal bowel sounds Musculoskeletal:no cyanosis of digits and no clubbing  PSYCH: alert & oriented x 3 with fluent speech NEURO: He is completely weak throughout.  He can barely shrug his shoulders  LABORATORY DATA:  I have reviewed the data as listed Lab Results  Component Value Date   WBC 9.9 12/09/2020   HGB 12.3 (L) 12/09/2020   HCT 34.8 (L) 12/09/2020   MCV 91.8 12/09/2020   PLT 75 (L) 12/09/2020   I have reviewed his peripheral blood smear.  I do not appreciate much schistocytes.  Normal morphology of red blood and white blood cell  RADIOGRAPHIC STUDIES: I have personally reviewed the radiological images as listed and agreed with the findings in the report. CT ANGIO HEAD NECK W WO CM  Result Date: 12/08/2020 CLINICAL DATA:  Stroke, follow-up. Acute quadriparesis. Small acute left perirolandic infarct on head MRI and suspected cervical and upper thoracic spinal cord infarct on spine MRI. EXAM: CT ANGIOGRAPHY HEAD AND NECK TECHNIQUE: Multidetector CT imaging of the head and neck was performed using the standard protocol during bolus administration of intravenous contrast. Multiplanar CT image reconstructions and MIPs were obtained to evaluate the vascular anatomy. Carotid stenosis measurements (when applicable) are obtained utilizing NASCET criteria, using the distal internal carotid diameter as the denominator. CONTRAST:  55mL OMNIPAQUE IOHEXOL 350 MG/ML SOLN COMPARISON:  MRIs of the head, cervical spine, and thoracic spine from earlier today and yesterday FINDINGS: CT HEAD FINDINGS Brain: The subcentimeter acute left perirolandic infarct was better demonstrated on MRI. No new infarct, acute intracranial hemorrhage, mass, midline shift, or extra-axial  fluid collection is identified. The ventricles and sulci are within normal limits for age. Hypodensities in the cerebral white matter bilaterally are nonspecific but compatible with mild chronic small vessel ischemic disease. Vascular: Calcified atherosclerosis at the skull base. Skull: No fracture or suspicious osseous lesion. Sinuses: Mild mucosal thickening in the paranasal sinuses. Clear mastoid air cells. Orbits: Unremarkable. Review of the MIP images confirms the above findings CTA NECK FINDINGS Aortic arch: Normal variant aortic arch branching pattern with common origin of the brachiocephalic and left common carotid arteries and with the left vertebral artery arising directly from the arch. Widely patent arch vessel origins. Right carotid system: Patent without evidence of stenosis, dissection, or significant atherosclerosis. Left carotid system: Patent  without evidence of stenosis, dissection, or significant atherosclerosis. Vertebral arteries: Patent and codominant without evidence of stenosis, dissection, or significant atherosclerosis. Skeleton: Lucency in the T1 vertebral body with posterior cortical erosion and focal superior endplate fracture as seen on MRI. Other neck: No evidence of cervical lymphadenopathy or mass. Upper chest: Clear lung apices. Review of the MIP images confirms the above findings CTA HEAD FINDINGS Anterior circulation: The internal carotid arteries are patent from skull base to carotid termini with minimal nonstenotic plaque. ACAs and MCAs are patent without evidence of a proximal branch occlusion or significant proximal stenosis. 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 No aneurysm is identified. Posterior circulation: The intracranial vertebral arteries are widely patent to the basilar. Patent left PICA, right AICA, and bilateral SCA origins are visualized. The basilar artery is widely patent. There are moderately large right and diminutive left posterior communicating arteries with  hypoplasia of the right P1 segment, and there are also large anterior choroidal arteries which supply a portion of the PCA territory bilaterally. No significant proximal PCA stenosis is evident. No aneurysm is identified. Venous sinuses: Patent. Anatomic variants: As above. Review of the MIP images confirms the above findings IMPRESSION: 1. Minimal atherosclerosis in the head and neck without large vessel occlusion or significant proximal stenosis. 2. Lucency in the T1 vertebral body with posterior cortical erosion and superior endplate fracture as seen on MRI. This remains more concerning for neoplasm (metastatic disease, lymphoma, myeloma) as opposed to a benign bone infarct. Electronically Signed   By: Sebastian Ache M.D.   On: 12/08/2020 11:24   CT HEAD WO CONTRAST  Result Date: 12/07/2020 CLINICAL DATA:  Mental status change, unknown cause. Altered mental status. EXAM: CT HEAD WITHOUT CONTRAST TECHNIQUE: Contiguous axial images were obtained from the base of the skull through the vertex without intravenous contrast. COMPARISON:  No pertinent prior exams available for comparison. FINDINGS: Brain: Cerebral volume is normal. There is no acute intracranial hemorrhage. No demarcated cortical infarct. No extra-axial fluid collection. No evidence of an intracranial mass. No midline shift. Vascular: No hyperdense vessel.  Atherosclerotic calcifications Skull: No calvarial fracture or focal suspicious osseous lesion. Hyperostosis frontalis interna, predominantly on the left. Sinuses/Orbits: Visualized orbits show no acute finding. Mild bilateral ethmoid sinus mucosal thickening. Trace mucosal thickening within the bilateral sphenoid sinuses. Small bilateral maxillary sinus mucous retention cysts. IMPRESSION: No evidence of acute intracranial abnormality. Mild paranasal sinus disease, as described. Electronically Signed   By: Jackey Loge DO   On: 12/07/2020 13:05   MR Brain W and Wo Contrast  Result Date:  12/07/2020 CLINICAL DATA:  Neuro deficit, acute, stroke suspected.  Weakness. EXAM: MRI HEAD WITHOUT AND WITH CONTRAST TECHNIQUE: Multiplanar, multiecho pulse sequences of the brain and surrounding structures were obtained without and with intravenous contrast. CONTRAST:  7mL GADAVIST GADOBUTROL 1 MMOL/ML IV SOLN COMPARISON:  Head CTA 522 FINDINGS: Brain: There is a 6 mm acute cortical/subcortical infarct in the left perirolandic region. Scattered small T2 hyperintensities in the cerebral white matter bilaterally are nonspecific but compatible with mild chronic small vessel ischemic disease. The ventricles and sulci are within normal limits for age. No intracranial hemorrhage, mass, midline shift, extra-axial fluid collection, or abnormal enhancement is identified. Vascular: Major intracranial vascular flow voids are preserved. Skull and upper cervical spine: Unremarkable bone marrow signal. Asymmetric left frontal hyperostosis interna. Sinuses/Orbits: Unremarkable orbits. Unremarkable orbits. Mild bilateral ethmoid air cell mucosal thickening. Other: None. IMPRESSION: 1. 6 mm acute left perirolandic infarct. 2. Mild chronic small  vessel ischemic disease. Electronically Signed   By: Sebastian Ache M.D.   On: 12/07/2020 18:14   MR CERVICAL SPINE WO CONTRAST  Result Date: 12/08/2020 CLINICAL DATA:  Possible cord infarct. EXAM: MRI CERVICAL AND THORACIC SPINE WITHOUT CONTRAST TECHNIQUE: Multiplanar and multiecho pulse sequences of the cervical spine, to include the craniocervical junction and cervicothoracic junction, and the thoracic spine, were obtained without intravenous contrast. Diffusion-weighted imaging was performed. COMPARISON:  MRI cervical spine 12/07/2020 FINDINGS: MRI CERVICAL SPINE FINDINGS Findings of the cervical spine, including the long segment central cord signal abnormality, are unchanged from the earlier cervical spine MRI. Additionally, diffusion-weighted imaging was performed however, the images  are of limited diagnostic value. MRI THORACIC SPINE FINDINGS Alignment:  Physiologic. Vertebrae: Unchanged possible pathologic fracture at T1. Cord: Central cord signal abnormality terminates at the T1-2 level. The remainder of the thoracic spinal cord is normal. Paraspinal and other soft tissues: Negative. Disc levels: No spinal canal or neural foraminal stenosis. Nondiagnostic diffusion-weighted imaging. IMPRESSION: 1. Nondiagnostic attempted diffusion-weighted imaging of the cervical and thoracic spine. Utilization of a more tailored spine MRI protocol may be helpful if this can be made available. 2. Unchanged long segment central cervical spinal cord signal abnormality, which terminates at the T1-2 level. No signal abnormality within the remainder of the thoracic spinal cord. 3. Unchanged appearance of possible pathologic fracture at T1. Electronically Signed   By: Deatra Robinson M.D.   On: 12/08/2020 02:35   MR THORACIC SPINE WO CONTRAST  Result Date: 12/08/2020 CLINICAL DATA:  Possible cord infarct. EXAM: MRI CERVICAL AND THORACIC SPINE WITHOUT CONTRAST TECHNIQUE: Multiplanar and multiecho pulse sequences of the cervical spine, to include the craniocervical junction and cervicothoracic junction, and the thoracic spine, were obtained without intravenous contrast. Diffusion-weighted imaging was performed. COMPARISON:  MRI cervical spine 12/07/2020 FINDINGS: MRI CERVICAL SPINE FINDINGS Findings of the cervical spine, including the long segment central cord signal abnormality, are unchanged from the earlier cervical spine MRI. Additionally, diffusion-weighted imaging was performed however, the images are of limited diagnostic value. MRI THORACIC SPINE FINDINGS Alignment:  Physiologic. Vertebrae: Unchanged possible pathologic fracture at T1. Cord: Central cord signal abnormality terminates at the T1-2 level. The remainder of the thoracic spinal cord is normal. Paraspinal and other soft tissues: Negative. Disc  levels: No spinal canal or neural foraminal stenosis. Nondiagnostic diffusion-weighted imaging. IMPRESSION: 1. Nondiagnostic attempted diffusion-weighted imaging of the cervical and thoracic spine. Utilization of a more tailored spine MRI protocol may be helpful if this can be made available. 2. Unchanged long segment central cervical spinal cord signal abnormality, which terminates at the T1-2 level. No signal abnormality within the remainder of the thoracic spinal cord. 3. Unchanged appearance of possible pathologic fracture at T1. Electronically Signed   By: Deatra Robinson M.D.   On: 12/08/2020 02:35   MR Cervical Spine W or Wo Contrast  Result Date: 12/07/2020 CLINICAL DATA:  Demyelinating disease.  Weakness. EXAM: MRI CERVICAL SPINE WITHOUT AND WITH CONTRAST TECHNIQUE: Multiplanar and multiecho pulse sequences of the cervical spine, to include the craniocervical junction and cervicothoracic junction, were obtained without and with intravenous contrast. CONTRAST:  7mL GADAVIST GADOBUTROL 1 MMOL/ML IV SOLN COMPARISON:  None. FINDINGS: The study is motion degraded including severe motion on the axial T2 gradient echo sequence. Alignment: Reversal of the normal cervical lordosis. Trace anterolisthesis of C4 on C5 and C7 on T1. Vertebrae: Abnormal marked T1 hypointensity, STIR hyperintensity, and mild enhancement involving the marrow throughout the majority of the T1 vertebral body  and extending into the right-sided posterior elements with an associated fracture involving the superior endplate and posterior vertebral body cortex resulting in focal 40% vertebral body height loss and 3 mm retropulsion of the posterior vertebral body cortex. 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 Abnormal signal hypointensity posteriorly in the C6 and C7 vertebral bodies on the sagittal T1 postcontrast sequence without a corresponding abnormality on the other sequences to confirm an  underlying lesion. Degenerative endplate changes at C5-6 and C6-7. Cord: Mild T2 hyperintensity involving the central and ventral spinal cord bilaterally from C5 into the included upper thoracic spine without enhancement. Posterior Fossa, vertebral arteries, paraspinal tissues: Unremarkable. Disc levels: C2-3: Severe right facet arthrosis result in mild-to-moderate right neural foraminal stenosis without spinal stenosis. C3-4: Moderate facet arthrosis results in mild right neural foraminal stenosis without spinal stenosis. C4-5: Anterolisthesis with bulging uncovered disc, uncovertebral spurring, and moderate right facet arthrosis result in mild spinal stenosis and moderate right neural foraminal stenosis. C5-6: Mild-to-moderate disc space narrowing. Disc bulging and uncovertebral spurring result in moderate right and severe left neural foraminal stenosis without significant spinal stenosis. C6-7: Mild-to-moderate disc space narrowing. Disc bulging, a small central disc protrusion, and uncovertebral spurring result in borderline spinal stenosis and severe right and moderate to severe left neural foraminal stenosis. C7-T1: Anterolisthesis with disc uncovering, infolding of the ligamentum flavum, and severe facet arthrosis result in mild-to-moderate spinal stenosis and severe bilateral neural foraminal stenosis. IMPRESSION: 1. Lesion involving the T1 vertebral body and right-sided posterior elements with associated pathologic vertebral fracture concerning for metastatic disease or other neoplasm. 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 2. Abnormal T2 hyperintensity involving the central and ventral aspects of the spinal cord from C5 into the included upper thoracic spine. Considerations include cord infarct, transverse myelitis, viral myelitis, and other inflammatory processes such as sarcoidosis. No enhancement or masslike finding to suggest intramedullary cord tumor although thoracic spine imaging is  recommended for further evaluation. 3. Disc and facet degeneration resulting in severe multilevel neural foraminal stenosis as above. 4. Mild-to-moderate spinal stenosis at C7-T1. Electronically Signed   By: Sebastian Ache M.D.   On: 12/07/2020 18:45   CT CHEST ABDOMEN PELVIS W CONTRAST  Result Date: 12/08/2020 CLINICAL DATA:  Thoracic spine osseous lesion. Assessment for potential primary malignancy EXAM: CT CHEST, ABDOMEN, AND PELVIS WITH CONTRAST TECHNIQUE: Multidetector CT imaging of the chest, abdomen and pelvis was performed following the standard protocol during bolus administration of intravenous contrast. CONTRAST:  OMNIPAQUE IOHEXOL 300 MG/ML  SOLN COMPARISON:  None. FINDINGS: CT CHEST FINDINGS Cardiovascular: No significant vascular findings. Normal heart size. No pericardial effusion. Mediastinum/Nodes: No enlarged mediastinal, hilar, or axillary lymph nodes. Thyroid gland, trachea, and esophagus demonstrate no significant findings. Lungs/Pleura: Lungs are clear. No pleural effusion or pneumothorax. Musculoskeletal: Lucent lesion of T1 height and small superior endplate defect. No other focal osseous lesion. CT ABDOMEN PELVIS FINDINGS Hepatobiliary: No focal liver abnormality is seen. No gallstones, gallbladder wall thickening, or biliary dilatation. Pancreas: Unremarkable. No pancreatic ductal dilatation or surrounding inflammatory changes. Spleen: Normal in size without focal abnormality. Adrenals/Urinary Tract: Normal adrenal glands. Bilateral mild perinephric stranding. Exophytic left renal cyst measuring 16 mm. Stomach/Bowel: Stomach is within normal limits. Appendix appears normal. No evidence of bowel wall thickening, distention, or inflammatory changes. Vascular/Lymphatic: Aortic atherosclerosis. No enlarged abdominal or pelvic lymph nodes. Reproductive: Enlarged prostate measuring 5.8 cm transverse dimension. Other: No abdominal wall hernia or abnormality. No abdominopelvic ascites.  Musculoskeletal: No acute or significant osseous findings. IMPRESSION: 1. No primary malignancy identified in the chest, abdomen or pelvis. 2. Lucent lesion of T1 height and small superior endplate defect, nonspecific. No other focal osseous lesions. 3. Bilateral mild perinephric stranding, nonspecific. 4. Enlarged prostate measuring 5.8 cm transverse dimension. Aortic Atherosclerosis (ICD10-I70.0). Electronically Signed   By: Deatra Robinson M.D.   On: 12/08/2020 03:21   DG Chest Port 1 View  Result Date: 12/07/2020 CLINICAL DATA:  Altered level of consciousness. EXAM: PORTABLE CHEST 1 VIEW COMPARISON:  None. FINDINGS: The heart size and mediastinal contours are within normal limits. Both lungs are clear. The visualized skeletal structures are unremarkable. IMPRESSION: No active disease. Electronically Signed   By: Marlan Palau M.D.   On: 12/07/2020 12:19   ECHOCARDIOGRAM COMPLETE  Result Date: 12/09/2020    ECHOCARDIOGRAM REPORT   Patient Name:   [name] Date of Exam: 12/09/2020 Medical Rec #:  [id number]    Height:       68.5 in Accession #:    [id number]   Weight:       160.0 lb Date of Birth:  06-19-1945    BSA:          1.869 m Patient Age:    75 years     BP:           169/91 mmHg Patient Gender: M            HR:           57 bpm. Exam Location:  Inpatient Procedure: 2D Echo, Cardiac Doppler and Color Doppler Indications:    CVA  History:        Patient has no prior history of Echocardiogram examinations.  Sonographer:    Neomia Dear RDCS Referring Phys: [id number] Cecille Po MELVIN IMPRESSIONS  1. Left ventricular ejection fraction, by estimation, is 60 to 65%. The left ventricle has normal function. The left ventricle has no regional wall motion abnormalities. Left ventricular diastolic parameters were normal.  2. Right ventricular systolic function is normal. The right ventricular size is normal. There is normal pulmonary artery systolic pressure. The estimated right ventricular systolic pressure is  24.3 mmHg.  3. The mitral valve is normal in structure. No evidence of mitral valve regurgitation. No evidence of mitral stenosis.  4. The aortic valve is normal in structure. Aortic valve regurgitation is trivial. No aortic stenosis is present.  5. The inferior vena cava is normal in size with greater than 50% respiratory variability, suggesting right atrial pressure of 3 mmHg. Conclusion(s)/Recommendation(s): No evidence of valvular vegetations on this transthoracic echocardiogram. Consider a transesophageal echocardiogram to exclude infective endocarditis if clinically indicated. FINDINGS  Left Ventricle: Left ventricular ejection fraction, by estimation, is 60 to 65%. The left ventricle has normal function. The left ventricle has no regional wall motion abnormalities. The left ventricular internal cavity size was normal in size. There is  no left ventricular hypertrophy. Left ventricular diastolic parameters were normal. Right Ventricle: The right ventricular size is normal. No increase in right ventricular wall thickness. Right ventricular systolic function is normal. There is normal pulmonary artery systolic pressure. The tricuspid regurgitant velocity is 2.31 m/s, and  with an assumed right atrial pressure of 3 mmHg, the estimated right ventricular systolic pressure is 24.3 mmHg. Left Atrium: Left atrial size was normal in size. Right Atrium: Right atrial size was normal in size. Pericardium: There is no evidence of pericardial effusion. Mitral Valve: The mitral valve is normal in structure.  No evidence of mitral valve regurgitation. No evidence of mitral valve stenosis. MV peak gradient, 3.6 mmHg. The mean mitral valve gradient is 1.0 mmHg. Tricuspid Valve: The tricuspid valve is normal in structure. Tricuspid valve regurgitation is trivial. No evidence of tricuspid stenosis. Aortic Valve: The aortic valve is normal in structure. Aortic valve regurgitation is trivial. No aortic stenosis is present. Aortic  valve mean gradient measures 3.0 mmHg. Aortic valve peak gradient measures 6.7 mmHg. Aortic valve area, by VTI measures 3.90 cm. Pulmonic Valve: The pulmonic valve was not well visualized. Pulmonic valve regurgitation is trivial. No evidence of pulmonic stenosis. Aorta: The aortic root is normal in size and structure. Venous: The inferior vena cava is normal in size with greater than 50% respiratory variability, suggesting right atrial pressure of 3 mmHg. IAS/Shunts: The atrial septum is grossly normal.  LEFT VENTRICLE PLAX 2D LVIDd:         4.70 cm  Diastology LVIDs:         3.50 cm  LV e' medial:    7.40 cm/s LV PW:         0.80 cm  LV E/e' medial:  9.3 LV IVS:        1.00 cm  LV e' lateral:   11.20 cm/s LVOT diam:     2.30 cm  LV E/e' lateral: 6.2 LV SV:         106 LV SV Index:   57 LVOT Area:     4.15 cm  RIGHT VENTRICLE RV Basal diam:  4.10 cm RV Mid diam:    2.60 cm RV S prime:     17.60 cm/s TAPSE (M-mode): 2.8 cm LEFT ATRIUM             Index       RIGHT ATRIUM           Index LA Vol (A2C):   53.9 ml 28.84 ml/m RA Area:     13.30 cm LA Vol (A4C):   44.9 ml 24.02 ml/m RA Volume:   29.30 ml  15.68 ml/m LA Biplane Vol: 51.5 ml 27.56 ml/m  AORTIC VALVE                   PULMONIC VALVE AV Area (Vmax):    3.90 cm    PV Vmax:       0.97 m/s AV Area (Vmean):   3.84 cm    PV Vmean:      65.200 cm/s AV Area (VTI):     3.90 cm    PV VTI:        0.208 m AV Vmax:           129.00 cm/s PV Peak grad:  3.7 mmHg AV Vmean:          83.400 cm/s PV Mean grad:  2.0 mmHg AV VTI:            0.272 m AV Peak Grad:      6.7 mmHg AV Mean Grad:      3.0 mmHg LVOT Vmax:         121.00 cm/s LVOT Vmean:        77.100 cm/s LVOT VTI:          0.255 m LVOT/AV VTI ratio: 0.94 AR Vena Contracta: 0.20 cm  AORTA Ao Root diam: 3.15 cm Ao Asc diam:  3.20 cm MITRAL VALVE               TRICUSPID VALVE MV  Area (PHT): 3.30 cm    TR Peak grad:   21.3 mmHg MV Area VTI:   3.69 cm    TR Vmax:        231.00 cm/s MV Peak grad:  3.6 mmHg MV Mean  grad:  1.0 mmHg    SHUNTS MV Vmax:       0.95 m/s    Systemic VTI:  0.26 m MV Vmean:      48.3 cm/s   Systemic Diam: 2.30 cm MV Decel Time: 230 msec MV E velocity: 69.00 cm/s MV A velocity: 63.40 cm/s MV E/A ratio:  1.09 Weston BrassGayatri Acharya MD Electronically signed by Weston BrassGayatri Acharya MD Signature Date/Time: 12/09/2020/1:04:12 PM    Final    VAS US CAROTID  Result Date: 12/08/2020 Carotid Arterial Duplex Study Patient Name:  [name]  Date of Exam:   12/08/2020 Medical Rec #: [id number]     Accession #:    [id number] Date of Birth: 1945-07-07     Patient Gender: M Patient Age:   [age] years [facility] Procedure:      VAS US CAROTID Referring Phys: Lyn HollingsheadALEXANDER MELVIN --------------------------------------------------------------------------------  Indications:       CVA. Other Factors:     No known past medical history. Comparison Study:  No previous exams Performing Technologist: Jody Hill RVT, RDMS  Examination Guidelines: A complete evaluation includes B-mode imaging, spectral Doppler, color Doppler, and power Doppler as needed of all accessible portions of each vessel. Bilateral testing is considered an integral part of a complete examination. Limited examinations for reoccurring indications may be performed as noted.  Right Carotid Findings: +----------+--------+--------+--------+------------------+------------------+           PSV cm/sEDV cm/sStenosisPlaque DescriptionComments           +----------+--------+--------+--------+------------------+------------------+ CCA Prox  107     0                                 intimal thickening +----------+--------+--------+--------+------------------+------------------+ CCA Distal94      0                                 intimal thickening +----------+--------+--------+--------+------------------+------------------+ ICA Prox  81      8                                                     +----------+--------+--------+--------+------------------+------------------+ ICA Distal34      8                                                    +----------+--------+--------+--------+------------------+------------------+ ECA       86      0                                                    +----------+--------+--------+--------+------------------+------------------+ +----------+--------+-------+----------------+-------------------+           PSV cm/sEDV cmsDescribe  Arm Pressure (mmHG) +----------+--------+-------+----------------+-------------------+ Subclavian114            Multiphasic, WNL                    +----------+--------+-------+----------------+-------------------+ +---------+--------+--+--------+-+---------+ VertebralPSV cm/s55EDV cm/s8Antegrade +---------+--------+--+--------+-+---------+  Left Carotid Findings: +----------+--------+--------+--------+------------------+------------------+           PSV cm/sEDV cm/sStenosisPlaque DescriptionComments           +----------+--------+--------+--------+------------------+------------------+ CCA Prox  103     0                                 intimal thickening +----------+--------+--------+--------+------------------+------------------+ CCA Distal108     12                                intimal thickening +----------+--------+--------+--------+------------------+------------------+ ICA Prox  59      14                                                   +----------+--------+--------+--------+------------------+------------------+ ICA Distal63      15                                                   +----------+--------+--------+--------+------------------+------------------+ ECA       102     0                                                    +----------+--------+--------+--------+------------------+------------------+  +----------+--------+--------+----------------+-------------------+           PSV cm/sEDV cm/sDescribe        Arm Pressure (mmHG) +----------+--------+--------+----------------+-------------------+ [id number]             Multiphasic, WNL                    +----------+--------+--------+----------------+-------------------+ +---------+--------+--+--------+-+---------+ VertebralPSV cm/s46EDV cm/s6Antegrade +---------+--------+--+--------+-+---------+   Summary: Right Carotid: The extracranial vessels were near-normal with only minimal wall                thickening or plaque. Left Carotid: The extracranial vessels were near-normal with only minimal wall               thickening or plaque. Vertebrals:  Bilateral vertebral arteries demonstrate antegrade flow. Subclavians: Normal flow hemodynamics were seen in bilateral subclavian              arteries. *See table(s) above for measurements and observations.     Preliminary    VAS [place] LOWER EXTREMITY VENOUS (DVT)  Result Date: 12/09/2020  Lower Venous DVT Study Patient Name:  [name]  Date of Exam:   12/08/2020 Medical Rec #: [id number]     Accession #:    [id number] Date of Birth: 16-Jul-1945     Patient Gender: M Patient Age:   46 years Exam Location:  [facility] Procedure:      VAS [place] LOWER EXTREMITY VENOUS (DVT) Referring Phys: Scheryl Marten XU --------------------------------------------------------------------------------  Indications: Stroke.  Limitations: Positioning and poor ultrasound/tissue interface. Comparison Study: No prior study on file Performing Technologist: Sherren Kerns RVS  Examination Guidelines: A complete evaluation includes B-mode imaging, spectral Doppler, color Doppler, and power Doppler as needed of all accessible portions of each vessel. Bilateral testing is considered an integral part of a complete examination. Limited examinations for reoccurring indications may be performed as noted. The reflux portion of the  exam is performed with the patient in reverse Trendelenburg.  +---------+---------------+---------+-----------+----------+--------------+ RIGHT    CompressibilityPhasicitySpontaneityPropertiesThrombus Aging +---------+---------------+---------+-----------+----------+--------------+ CFV      Full           Yes      Yes                                 +---------+---------------+---------+-----------+----------+--------------+ SFJ      Full                                                        +---------+---------------+---------+-----------+----------+--------------+ FV Prox  Full                                                        +---------+---------------+---------+-----------+----------+--------------+ FV Mid   Full                                                        +---------+---------------+---------+-----------+----------+--------------+ FV DistalFull                                                        +---------+---------------+---------+-----------+----------+--------------+ PFV      Full                                                        +---------+---------------+---------+-----------+----------+--------------+ POP      Full           Yes      Yes                                 +---------+---------------+---------+-----------+----------+--------------+ PTV      Full                                                        +---------+---------------+---------+-----------+----------+--------------+ PERO     Full                                                        +---------+---------------+---------+-----------+----------+--------------+   +---------+---------------+---------+-----------+----------+--------------+  LEFT     CompressibilityPhasicitySpontaneityPropertiesThrombus Aging +---------+---------------+---------+-----------+----------+--------------+ CFV      Full           Yes      Yes                                  +---------+---------------+---------+-----------+----------+--------------+ SFJ      Full                                                        +---------+---------------+---------+-----------+----------+--------------+ FV Prox  Full                                                        +---------+---------------+---------+-----------+----------+--------------+ FV Mid   Full                                                        +---------+---------------+---------+-----------+----------+--------------+ FV DistalFull                                                        +---------+---------------+---------+-----------+----------+--------------+ PFV      Full                                                        +---------+---------------+---------+-----------+----------+--------------+ POP      Full           Yes      Yes                                 +---------+---------------+---------+-----------+----------+--------------+ PTV      Full                                                        +---------+---------------+---------+-----------+----------+--------------+ PERO     Full                                                        +---------+---------------+---------+-----------+----------+--------------+     Summary: BILATERAL: - No evidence of deep vein thrombosis seen in the lower extremities, bilaterally. -No evidence of popliteal cyst, bilaterally.   *See table(s) above for measurements and observations. Electronically signed by Heath Lark on 12/09/2020 at 2:01:50 PM.  Final

## 2020-12-09 NOTE — Progress Notes (Addendum)
[facility] PROGRESS NOTE   [name] RJJ:[id number] DOB: August 23, 1945 DOA: 12/07/2020  PCP: Pcp, No  Brief History/Interval Summary: 75 y.o. male with no known past medical history presenting with weakness.  Concern was for a neurological process.  MRI showed acute stroke.  Patient was seen by neurology.  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  Patient was hospitalized for further management.  Consultants: Neurology.  Hematology oncology  Procedures: None  Antibiotics: Anti-infectives (From admission, onward)    None       Subjective/Interval History: Patient feels more stiff in his upper and lower extremities today.  Denies any headaches.  No new symptoms otherwise.      Assessment/Plan:  Acute stroke Neurology is following.  Concern is for spinal stroke.  Patient with significant weakness in bilateral upper and lower extremities.  Management per neurology.   CIR recommended by physical therapy.   HbA1c 5.5.  LDL 180.  Will need statin before discharge.  Carotid Doppler was unremarkable.  No DVT noted on lower extremity Doppler studies.  Echocardiogram pending.  Abnormal cervical MRI findings with concern for metastatic lesions/enlarged prostate T1 lesion suspicious for metastatic lesion.  CT scan of the chest abdomen pelvis does not show any obvious primary.  Prostate gland was noted to be enlarged.  PSA was checked and is noted to be elevated at 67.79.  May still need to biopsy the lesion.  Will discuss with oncology.    Thrombocytopenia Patient noted to have low platelet counts.  No old labs available.  DIC panel was done which showed elevated D-dimer with low fibrinogen along with schistocytes.  Discussed with the hematology/oncology, Dr. Bertis RuddyGorsuch.  She will consult on this patient thinks that this could be DIC rather than TTP.  Additional labs will be ordered by her.  No evidence for bleeding.  Vitamin B12 378.  Folic acid  11.1.  Hyperbilirubinemia Noted to be mostly indirect.  Recently done CT abdomen did not identify any concerns in the hepatobiliary system.  Could be Guilbert's.  Urinary retention Patient has had multiple in and out catheterization.  This is most likely due to his acute neurological issue.  Foley catheter was subsequently placed.    DVT Prophylaxis: SCDs Code Status: DNR Family Communication:  we will update his wife later today Disposition Plan: CIR possibly  Status is: Inpatient  Remains inpatient appropriate because:Ongoing diagnostic testing needed not appropriate for outpatient work up and Inpatient level of care appropriate due to severity of illness  Dispo:  Patient From: Home  Planned Disposition: Inpatient Rehab  Medically stable for discharge: No          Medications: Scheduled:  aspirin EC  81 mg Oral Daily   atorvastatin  80 mg Oral Daily   Chlorhexidine Gluconate Cloth  6 each Topical Daily   Continuous:  sodium chloride 125 mL/hr at 12/09/20 0327   KZS:[id number]:acetaminophen **OR** acetaminophen (TYLENOL) oral liquid 160 mg/5 mL **OR** acetaminophen, polyvinyl alcohol, senna-docusate   Objective:  Vital Signs  Vitals:   12/08/20 1948 12/08/20 2326 12/09/20 0320 12/09/20 0742  BP: (!) 118/57 (!) 107/56 (!) 107/59 121/62  Pulse: (!) 58 (!) 58 (!) 57 61  Resp: 17 15 14 18   Temp: 97.9 F (36.6 C) 97.9 F (36.6 C) 97.9 F (36.6 C) 97.8 F (36.6 C)  TempSrc: Oral Oral Oral Oral  SpO2: 98% 97% 96% 96%  Weight:      Height:        Intake/Output Summary (  Last 24 hours) at 12/09/2020 1151 Last data filed at 12/09/2020 0321 Gross per 24 hour  Intake --  Output 740 ml  Net -740 ml    Filed Weights   12/08/20 0907  Weight: 72.6 kg    General appearance: Awake alert.  In no distress Resp: Clear to auscultation bilaterally.  Normal effort Cardio: S1-S2 is normal regular.  No S3-S4.  No rubs murmurs or bruit GI: Abdomen is soft.  Nontender nondistended.   Bowel sounds are present normal.  No masses organomegaly Extremities: No edema.  Neurologic: Alert and oriented x3.  Keeping his upper extremities in a flexed position.  Minimal movement noted in bilateral lower extremities    Lab Results:  Data Reviewed: I have personally reviewed following labs and imaging studies  CBC: Recent Labs  Lab 12/07/20 1207 12/08/20 1105 12/08/20 1411 12/09/20 0113 12/09/20 0948  WBC 8.8 8.3  --  8.8 9.9  NEUTROABS 7.7  --   --   --  8.2*  HGB 15.2 13.5  --  11.9* 12.3*  HCT 43.1 38.6*  --  34.7* 34.8*  MCV 90.4 92.3  --  91.6 91.8  PLT 103* 81* 75* 78* 75*     Basic Metabolic Panel: Recent Labs  Lab 12/07/20 1207 12/09/20 0113  NA 135 136  K 4.1 4.1  CL 102 111  CO2 23 19*  GLUCOSE 124* 124*  BUN 13 20  CREATININE 1.00 0.88  CALCIUM 9.6 8.2*     GFR: Estimated Creatinine Clearance: 73.6 mL/min (by C-G formula based on SCr of 0.88 mg/dL).  Liver Function Tests: Recent Labs  Lab 12/07/20 1207 12/09/20 0113 12/09/20 0948  AST 30 40  --   ALT 13 20  --   ALKPHOS 53 70  --   BILITOT 2.9* 2.2* 2.2*  PROT 6.1* 4.6*  --   ALBUMIN 4.0 2.8*  --        Coagulation Profile: Recent Labs  Lab 12/07/20 1207 12/08/20 1105 12/08/20 1411 12/09/20 0113  INR 1.8* 1.3* 1.2 1.2     Cardiac Enzymes: Recent Labs  Lab 12/07/20 1207  CKTOTAL 82      HbA1C: Recent Labs    12/08/20 0507  HGBA1C 5.5     CBG: Recent Labs  Lab 12/07/20 1151  GLUCAP 111*     Lipid Profile: Recent Labs    12/08/20 0507  CHOL 231*  HDL 38*  LDLCALC 180*  TRIG 66  CHOLHDL 6.1       Recent Results (from the past 240 hour(s))  Resp Panel by RT-PCR (Flu A&B, Covid) Nasopharyngeal Swab     Status: None   Collection Time: 12/07/20 12:07 PM   Specimen: Nasopharyngeal Swab; Nasopharyngeal(NP) swabs in vial transport medium  Result Value Ref Range Status   SARS Coronavirus 2 by RT PCR NEGATIVE NEGATIVE Final    Comment:  (NOTE) SARS-CoV-2 target nucleic acids are NOT DETECTED.  The SARS-CoV-2 RNA is generally detectable in upper respiratory specimens during the acute phase of infection. The lowest concentration of SARS-CoV-2 viral copies this assay can detect is 138 copies/mL. A negative result does not preclude SARS-Cov-2 infection and should not be used as the sole basis for treatment or other patient management decisions. A negative result may occur with  improper specimen collection/handling, submission of specimen other than nasopharyngeal swab, presence of viral mutation(s) within the areas targeted by this assay, and inadequate number of viral copies(<138 copies/mL). A negative result must be combined with clinical  observations, patient history, and epidemiological information. The expected result is Negative.  Fact Sheet for Patients:  BloggerCourse.com  Fact Sheet for Healthcare Providers:  SeriousBroker.it  This test is no t yet approved or cleared by the [place] FDA and  has been authorized for detection and/or diagnosis of SARS-CoV-2 by FDA under an Emergency Use Authorization (EUA). This EUA will remain  in effect (meaning this test can be used) for the duration of the COVID-19 declaration under Section 564(b)(1) of the Act, 21 U.S.C.section 360bbb-3(b)(1), unless the authorization is terminated  or revoked sooner.       Influenza A by PCR NEGATIVE NEGATIVE Final   Influenza B by PCR NEGATIVE NEGATIVE Final    Comment: (NOTE) The Xpert Xpress SARS-CoV-2/FLU/RSV plus assay is intended as an aid in the diagnosis of influenza from Nasopharyngeal swab specimens and should not be used as a sole basis for treatment. Nasal washings and aspirates are unacceptable for Xpert Xpress SARS-CoV-2/FLU/RSV testing.  Fact Sheet for Patients: BloggerCourse.com  Fact Sheet for Healthcare  Providers: SeriousBroker.it  This test is not yet approved or cleared by the [place] FDA and has been authorized for detection and/or diagnosis of SARS-CoV-2 by FDA under an Emergency Use Authorization (EUA). This EUA will remain in effect (meaning this test can be used) for the duration of the COVID-19 declaration under Section 564(b)(1) of the Act, 21 U.S.C. section 360bbb-3(b)(1), unless the authorization is terminated or revoked.  Performed at [facility], [address] [place], [place] [address]        Radiology Studies: CT ANGIO HEAD NECK W WO CM  Result Date: 12/08/2020 CLINICAL DATA:  Stroke, follow-up. Acute quadriparesis. Small acute left perirolandic infarct on head MRI and suspected cervical and upper thoracic spinal cord infarct on spine MRI. EXAM: CT ANGIOGRAPHY HEAD AND NECK TECHNIQUE: Multidetector CT imaging of the head and neck was performed using the standard protocol during bolus administration of intravenous contrast. Multiplanar CT image reconstructions and MIPs were obtained to evaluate the vascular anatomy. Carotid stenosis measurements (when applicable) are obtained utilizing NASCET criteria, using the distal internal carotid diameter as the denominator. CONTRAST:  75mL OMNIPAQUE IOHEXOL 350 MG/ML SOLN COMPARISON:  MRIs of the head, cervical spine, and thoracic spine from earlier today and yesterday FINDINGS: CT HEAD FINDINGS Brain: The subcentimeter acute left perirolandic infarct was better demonstrated on MRI. No new infarct, acute intracranial hemorrhage, mass, midline shift, or extra-axial fluid collection is identified. The ventricles and sulci are within normal limits for age. Hypodensities in the cerebral white matter bilaterally are nonspecific but compatible with mild chronic small vessel ischemic disease. Vascular: Calcified atherosclerosis at the skull base. Skull: No fracture or suspicious osseous lesion. Sinuses: Mild  mucosal thickening in the paranasal sinuses. Clear mastoid air cells. Orbits: Unremarkable. Review of the MIP images confirms the above findings CTA NECK FINDINGS Aortic arch: Normal variant aortic arch branching pattern with common origin of the brachiocephalic and left common carotid arteries and with the left vertebral artery arising directly from the arch. Widely patent arch vessel origins. Right carotid system: Patent without evidence of stenosis, dissection, or significant atherosclerosis. Left carotid system: Patent without evidence of stenosis, dissection, or significant atherosclerosis. Vertebral arteries: Patent and codominant without evidence of stenosis, dissection, or significant atherosclerosis. Skeleton: Lucency in the T1 vertebral body with posterior cortical erosion and focal superior endplate fracture as seen on MRI. Other neck: No evidence of cervical lymphadenopathy or mass. Upper chest: Clear lung apices. Review  of the MIP images confirms the above findings CTA HEAD FINDINGS Anterior circulation: The internal carotid arteries are patent from skull base to carotid termini with minimal nonstenotic plaque. ACAs and MCAs are patent without evidence of a proximal branch occlusion or significant proximal stenosis. 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 No aneurysm is identified. Posterior circulation: The intracranial vertebral arteries are widely patent to the basilar. Patent left PICA, right AICA, and bilateral SCA origins are visualized. The basilar artery is widely patent. There are moderately large right and diminutive left posterior communicating arteries with hypoplasia of the right P1 segment, and there are also large anterior choroidal arteries which supply a portion of the PCA territory bilaterally. No significant proximal PCA stenosis is evident. No aneurysm is identified. Venous sinuses: Patent. Anatomic variants: As above. Review of the MIP images confirms the above findings IMPRESSION: 1.  Minimal atherosclerosis in the head and neck without large vessel occlusion or significant proximal stenosis. 2. Lucency in the T1 vertebral body with posterior cortical erosion and superior endplate fracture as seen on MRI. This remains more concerning for neoplasm (metastatic disease, lymphoma, myeloma) as opposed to a benign bone infarct. Electronically Signed   By: Sebastian Ache M.D.   On: 12/08/2020 11:24   CT HEAD WO CONTRAST  Result Date: 12/07/2020 CLINICAL DATA:  Mental status change, unknown cause. Altered mental status. EXAM: CT HEAD WITHOUT CONTRAST TECHNIQUE: Contiguous axial images were obtained from the base of the skull through the vertex without intravenous contrast. COMPARISON:  No pertinent prior exams available for comparison. FINDINGS: Brain: Cerebral volume is normal. There is no acute intracranial hemorrhage. No demarcated cortical infarct. No extra-axial fluid collection. No evidence of an intracranial mass. No midline shift. Vascular: No hyperdense vessel.  Atherosclerotic calcifications Skull: No calvarial fracture or focal suspicious osseous lesion. Hyperostosis frontalis interna, predominantly on the left. Sinuses/Orbits: Visualized orbits show no acute finding. Mild bilateral ethmoid sinus mucosal thickening. Trace mucosal thickening within the bilateral sphenoid sinuses. Small bilateral maxillary sinus mucous retention cysts. IMPRESSION: No evidence of acute intracranial abnormality. Mild paranasal sinus disease, as described. Electronically Signed   By: Jackey Loge DO   On: 12/07/2020 13:05   MR Brain W and Wo Contrast  Result Date: 12/07/2020 CLINICAL DATA:  Neuro deficit, acute, stroke suspected.  Weakness. EXAM: MRI HEAD WITHOUT AND WITH CONTRAST TECHNIQUE: Multiplanar, multiecho pulse sequences of the brain and surrounding structures were obtained without and with intravenous contrast. CONTRAST:  18mL GADAVIST GADOBUTROL 1 MMOL/ML IV SOLN COMPARISON:  Head CTA 522 FINDINGS:  Brain: There is a 6 mm acute cortical/subcortical infarct in the left perirolandic region. Scattered small T2 hyperintensities in the cerebral white matter bilaterally are nonspecific but compatible with mild chronic small vessel ischemic disease. The ventricles and sulci are within normal limits for age. No intracranial hemorrhage, mass, midline shift, extra-axial fluid collection, or abnormal enhancement is identified. Vascular: Major intracranial vascular flow voids are preserved. Skull and upper cervical spine: Unremarkable bone marrow signal. Asymmetric left frontal hyperostosis interna. Sinuses/Orbits: Unremarkable orbits. Unremarkable orbits. Mild bilateral ethmoid air cell mucosal thickening. Other: None. IMPRESSION: 1. 6 mm acute left perirolandic infarct. 2. Mild chronic small vessel ischemic disease. Electronically Signed   By: Sebastian Ache M.D.   On: 12/07/2020 18:14   MR CERVICAL SPINE WO CONTRAST  Result Date: 12/08/2020 CLINICAL DATA:  Possible cord infarct. EXAM: MRI CERVICAL AND THORACIC SPINE WITHOUT CONTRAST TECHNIQUE: Multiplanar and multiecho pulse sequences of the cervical spine, to include the craniocervical  junction and cervicothoracic junction, and the thoracic spine, were obtained without intravenous contrast. Diffusion-weighted imaging was performed. COMPARISON:  MRI cervical spine 12/07/2020 FINDINGS: MRI CERVICAL SPINE FINDINGS Findings of the cervical spine, including the long segment central cord signal abnormality, are unchanged from the earlier cervical spine MRI. Additionally, diffusion-weighted imaging was performed however, the images are of limited diagnostic value. MRI THORACIC SPINE FINDINGS Alignment:  Physiologic. Vertebrae: Unchanged possible pathologic fracture at T1. Cord: Central cord signal abnormality terminates at the T1-2 level. The remainder of the thoracic spinal cord is normal. Paraspinal and other soft tissues: Negative. Disc levels: No spinal canal or neural  foraminal stenosis. Nondiagnostic diffusion-weighted imaging. IMPRESSION: 1. Nondiagnostic attempted diffusion-weighted imaging of the cervical and thoracic spine. Utilization of a more tailored spine MRI protocol may be helpful if this can be made available. 2. Unchanged long segment central cervical spinal cord signal abnormality, which terminates at the T1-2 level. No signal abnormality within the remainder of the thoracic spinal cord. 3. Unchanged appearance of possible pathologic fracture at T1. Electronically Signed   By: Deatra Robinson M.D.   On: 12/08/2020 02:35   MR THORACIC SPINE WO CONTRAST  Result Date: 12/08/2020 CLINICAL DATA:  Possible cord infarct. EXAM: MRI CERVICAL AND THORACIC SPINE WITHOUT CONTRAST TECHNIQUE: Multiplanar and multiecho pulse sequences of the cervical spine, to include the craniocervical junction and cervicothoracic junction, and the thoracic spine, were obtained without intravenous contrast. Diffusion-weighted imaging was performed. COMPARISON:  MRI cervical spine 12/07/2020 FINDINGS: MRI CERVICAL SPINE FINDINGS Findings of the cervical spine, including the long segment central cord signal abnormality, are unchanged from the earlier cervical spine MRI. Additionally, diffusion-weighted imaging was performed however, the images are of limited diagnostic value. MRI THORACIC SPINE FINDINGS Alignment:  Physiologic. Vertebrae: Unchanged possible pathologic fracture at T1. Cord: Central cord signal abnormality terminates at the T1-2 level. The remainder of the thoracic spinal cord is normal. Paraspinal and other soft tissues: Negative. Disc levels: No spinal canal or neural foraminal stenosis. Nondiagnostic diffusion-weighted imaging. IMPRESSION: 1. Nondiagnostic attempted diffusion-weighted imaging of the cervical and thoracic spine. Utilization of a more tailored spine MRI protocol may be helpful if this can be made available. 2. Unchanged long segment central cervical spinal cord  signal abnormality, which terminates at the T1-2 level. No signal abnormality within the remainder of the thoracic spinal cord. 3. Unchanged appearance of possible pathologic fracture at T1. Electronically Signed   By: Deatra Robinson M.D.   On: 12/08/2020 02:35   MR Cervical Spine W or Wo Contrast  Result Date: 12/07/2020 CLINICAL DATA:  Demyelinating disease.  Weakness. EXAM: MRI CERVICAL SPINE WITHOUT AND WITH CONTRAST TECHNIQUE: Multiplanar and multiecho pulse sequences of the cervical spine, to include the craniocervical junction and cervicothoracic junction, were obtained without and with intravenous contrast. CONTRAST:  7mL GADAVIST GADOBUTROL 1 MMOL/ML IV SOLN COMPARISON:  None. FINDINGS: The study is motion degraded including severe motion on the axial T2 gradient echo sequence. Alignment: Reversal of the normal cervical lordosis. Trace anterolisthesis of C4 on C5 and C7 on T1. Vertebrae: Abnormal marked T1 hypointensity, STIR hyperintensity, and mild enhancement involving the marrow throughout the majority of the T1 vertebral body and extending into the right-sided posterior elements with an associated fracture involving the superior endplate and posterior vertebral body cortex resulting in focal 40% vertebral body height loss and 3 mm retropulsion of the posterior vertebral body cortex. 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 Abnormal signal hypointensity posteriorly  in the C6 and C7 vertebral bodies on the sagittal T1 postcontrast sequence without a corresponding abnormality on the other sequences to confirm an underlying lesion. Degenerative endplate changes at C5-6 and C6-7. Cord: Mild T2 hyperintensity involving the central and ventral spinal cord bilaterally from C5 into the included upper thoracic spine without enhancement. Posterior Fossa, vertebral arteries, paraspinal tissues: Unremarkable. Disc levels: C2-3: Severe right facet arthrosis result in  mild-to-moderate right neural foraminal stenosis without spinal stenosis. C3-4: Moderate facet arthrosis results in mild right neural foraminal stenosis without spinal stenosis. C4-5: Anterolisthesis with bulging uncovered disc, uncovertebral spurring, and moderate right facet arthrosis result in mild spinal stenosis and moderate right neural foraminal stenosis. C5-6: Mild-to-moderate disc space narrowing. Disc bulging and uncovertebral spurring result in moderate right and severe left neural foraminal stenosis without significant spinal stenosis. C6-7: Mild-to-moderate disc space narrowing. Disc bulging, a small central disc protrusion, and uncovertebral spurring result in borderline spinal stenosis and severe right and moderate to severe left neural foraminal stenosis. C7-T1: Anterolisthesis with disc uncovering, infolding of the ligamentum flavum, and severe facet arthrosis result in mild-to-moderate spinal stenosis and severe bilateral neural foraminal stenosis. IMPRESSION: 1. Lesion involving the T1 vertebral body and right-sided posterior elements with associated pathologic vertebral fracture concerning for metastatic disease or other neoplasm. 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 2. Abnormal T2 hyperintensity involving the central and ventral aspects of the spinal cord from C5 into the included upper thoracic spine. Considerations include cord infarct, transverse myelitis, viral myelitis, and other inflammatory processes such as sarcoidosis. No enhancement or masslike finding to suggest intramedullary cord tumor although thoracic spine imaging is recommended for further evaluation. 3. Disc and facet degeneration resulting in severe multilevel neural foraminal stenosis as above. 4. Mild-to-moderate spinal stenosis at C7-T1. Electronically Signed   By: Sebastian Ache M.D.   On: 12/07/2020 18:45   CT CHEST ABDOMEN PELVIS W CONTRAST  Result Date: 12/08/2020 CLINICAL DATA:  Thoracic spine osseous lesion.  Assessment for potential primary malignancy EXAM: CT CHEST, ABDOMEN, AND PELVIS WITH CONTRAST TECHNIQUE: Multidetector CT imaging of the chest, abdomen and pelvis was performed following the standard protocol during bolus administration of intravenous contrast. CONTRAST:  OMNIPAQUE IOHEXOL 300 MG/ML  SOLN COMPARISON:  None. FINDINGS: CT CHEST FINDINGS Cardiovascular: No significant vascular findings. Normal heart size. No pericardial effusion. Mediastinum/Nodes: No enlarged mediastinal, hilar, or axillary lymph nodes. Thyroid gland, trachea, and esophagus demonstrate no significant findings. Lungs/Pleura: Lungs are clear. No pleural effusion or pneumothorax. Musculoskeletal: Lucent lesion of T1 height and small superior endplate defect. No other focal osseous lesion. CT ABDOMEN PELVIS FINDINGS Hepatobiliary: No focal liver abnormality is seen. No gallstones, gallbladder wall thickening, or biliary dilatation. Pancreas: Unremarkable. No pancreatic ductal dilatation or surrounding inflammatory changes. Spleen: Normal in size without focal abnormality. Adrenals/Urinary Tract: Normal adrenal glands. Bilateral mild perinephric stranding. Exophytic left renal cyst measuring 16 mm. Stomach/Bowel: Stomach is within normal limits. Appendix appears normal. No evidence of bowel wall thickening, distention, or inflammatory changes. Vascular/Lymphatic: Aortic atherosclerosis. No enlarged abdominal or pelvic lymph nodes. Reproductive: Enlarged prostate measuring 5.8 cm transverse dimension. Other: No abdominal wall hernia or abnormality. No abdominopelvic ascites. Musculoskeletal: No acute or significant osseous findings. IMPRESSION: 1. No primary malignancy identified in the chest, abdomen or pelvis. 2. Lucent lesion of T1 height and small superior endplate defect, nonspecific. No other focal osseous lesions. 3. Bilateral mild perinephric stranding, nonspecific. 4. Enlarged prostate measuring 5.8 cm transverse dimension.  Aortic Atherosclerosis (ICD10-I70.0). Electronically Signed  By: Deatra Robinson M.D.   On: 12/08/2020 03:21   VAS US CAROTID  Result Date: 12/08/2020 Carotid Arterial Duplex Study Patient Name:  [name]  Date of Exam:   12/08/2020 Medical Rec #: [id number]     Accession #:    [id number] Date of Birth: 1945-08-20     Patient Gender: M Patient Age:   28 years Exam Location:  [facility] Procedure:      VAS US CAROTID Referring Phys: Lyn Hollingshead MELVIN --------------------------------------------------------------------------------  Indications:       CVA. Other Factors:     No known past medical history. Comparison Study:  No previous exams Performing Technologist: Jody Hill RVT, RDMS  Examination Guidelines: A complete evaluation includes B-mode imaging, spectral Doppler, color Doppler, and power Doppler as needed of all accessible portions of each vessel. Bilateral testing is considered an integral part of a complete examination. Limited examinations for reoccurring indications may be performed as noted.  Right Carotid Findings: +----------+--------+--------+--------+------------------+------------------+           PSV cm/sEDV cm/sStenosisPlaque DescriptionComments           +----------+--------+--------+--------+------------------+------------------+ CCA Prox  107     0                                 intimal thickening +----------+--------+--------+--------+------------------+------------------+ CCA Distal94      0                                 intimal thickening +----------+--------+--------+--------+------------------+------------------+ ICA Prox  81      8                                                    +----------+--------+--------+--------+------------------+------------------+ ICA Distal34      8                                                    +----------+--------+--------+--------+------------------+------------------+ ECA       86      0                                                     +----------+--------+--------+--------+------------------+------------------+ +----------+--------+-------+----------------+-------------------+           PSV cm/sEDV cmsDescribe        Arm Pressure (mmHG) +----------+--------+-------+----------------+-------------------+ [id number]            Multiphasic, WNL                    +----------+--------+-------+----------------+-------------------+ +---------+--------+--+--------+-+---------+ VertebralPSV cm/s55EDV cm/s8Antegrade +---------+--------+--+--------+-+---------+  Left Carotid Findings: +----------+--------+--------+--------+------------------+------------------+           PSV cm/sEDV cm/sStenosisPlaque DescriptionComments           +----------+--------+--------+--------+------------------+------------------+ CCA Prox  103     0  intimal thickening +----------+--------+--------+--------+------------------+------------------+ CCA Distal108     12                                intimal thickening +----------+--------+--------+--------+------------------+------------------+ ICA Prox  59      14                                                   +----------+--------+--------+--------+------------------+------------------+ ICA Distal63      15                                                   +----------+--------+--------+--------+------------------+------------------+ ECA       102     0                                                    +----------+--------+--------+--------+------------------+------------------+ +----------+--------+--------+----------------+-------------------+           PSV cm/sEDV cm/sDescribe        Arm Pressure (mmHG) +----------+--------+--------+----------------+-------------------+ [id number]             Multiphasic, WNL                     +----------+--------+--------+----------------+-------------------+ +---------+--------+--+--------+-+---------+ VertebralPSV cm/s46EDV cm/s6Antegrade +---------+--------+--+--------+-+---------+   Summary: Right Carotid: The extracranial vessels were near-normal with only minimal wall                thickening or plaque. Left Carotid: The extracranial vessels were near-normal with only minimal wall               thickening or plaque. Vertebrals:  Bilateral vertebral arteries demonstrate antegrade flow. Subclavians: Normal flow hemodynamics were seen in bilateral subclavian              arteries. *See table(s) above for measurements and observations.     Preliminary    VAS [place] LOWER EXTREMITY VENOUS (DVT)  Result Date: 12/08/2020  Lower Venous DVT Study Patient Name:  SACHA RADLOFF  Date of Exam:   12/08/2020 Medical Rec #: [id number]     Accession #:    [id number] Date of Birth: December 11, 1945     Patient Gender: M Patient Age:   33 years Exam Location:  [facility] Procedure:      VAS [place] LOWER EXTREMITY VENOUS (DVT) Referring Phys: Scheryl Marten XU --------------------------------------------------------------------------------  Indications: Stroke.  Limitations: Positioning and poor ultrasound/tissue interface. Comparison Study: No prior study on file Performing Technologist: Sherren Kerns RVS  Examination Guidelines: A complete evaluation includes B-mode imaging, spectral Doppler, color Doppler, and power Doppler as needed of all accessible portions of each vessel. Bilateral testing is considered an integral part of a complete examination. Limited examinations for reoccurring indications may be performed as noted. The reflux portion of the exam is performed with the patient in reverse Trendelenburg.  +---------+---------------+---------+-----------+----------+--------------+ RIGHT    CompressibilityPhasicitySpontaneityPropertiesThrombus Aging  +---------+---------------+---------+-----------+----------+--------------+ CFV      Full           Yes      Yes                                 +---------+---------------+---------+-----------+----------+--------------+  SFJ      Full                                                        +---------+---------------+---------+-----------+----------+--------------+ FV Prox  Full                                                        +---------+---------------+---------+-----------+----------+--------------+ FV Mid   Full                                                        +---------+---------------+---------+-----------+----------+--------------+ FV DistalFull                                                        +---------+---------------+---------+-----------+----------+--------------+ PFV      Full                                                        +---------+---------------+---------+-----------+----------+--------------+ POP      Full           Yes      Yes                                 +---------+---------------+---------+-----------+----------+--------------+ PTV      Full                                                        +---------+---------------+---------+-----------+----------+--------------+ PERO     Full                                                        +---------+---------------+---------+-----------+----------+--------------+   +---------+---------------+---------+-----------+----------+--------------+ LEFT     CompressibilityPhasicitySpontaneityPropertiesThrombus Aging +---------+---------------+---------+-----------+----------+--------------+ CFV      Full           Yes      Yes                                 +---------+---------------+---------+-----------+----------+--------------+ SFJ      Full                                                         +---------+---------------+---------+-----------+----------+--------------+  FV Prox  Full                                                        +---------+---------------+---------+-----------+----------+--------------+ FV Mid   Full                                                        +---------+---------------+---------+-----------+----------+--------------+ FV DistalFull                                                        +---------+---------------+---------+-----------+----------+--------------+ PFV      Full                                                        +---------+---------------+---------+-----------+----------+--------------+ POP      Full           Yes      Yes                                 +---------+---------------+---------+-----------+----------+--------------+ PTV      Full                                                        +---------+---------------+---------+-----------+----------+--------------+ PERO     Full                                                        +---------+---------------+---------+-----------+----------+--------------+     Summary: BILATERAL: - No evidence of deep vein thrombosis seen in the lower extremities, bilaterally. -No evidence of popliteal cyst, bilaterally.   *See table(s) above for measurements and observations.    Preliminary        LOS: 1 day   Parks Cz[employer]kowski [employer] on [email or web address]  12/09/2020, 11:51 AM

## 2020-12-09 NOTE — Plan of Care (Signed)
  Problem: Safety: Goal: Non-violent Restraint(s) 12/09/2020 1630 by Jerral Ralph, RN Outcome: Progressing 12/09/2020 1621 by Jerral Ralph, RN Outcome: Progressing   Problem: Education: Goal: Knowledge of General Education information will improve Description: Including pain rating scale, medication(s)/side effects and non-pharmacologic comfort measures 12/09/2020 1630 by Jerral Ralph, RN Outcome: Progressing 12/09/2020 1621 by Jerral Ralph, RN Outcome: Progressing   Problem: Health Behavior/Discharge Planning: Goal: Ability to manage health-related needs will improve 12/09/2020 1630 by Jerral Ralph, RN Outcome: Progressing 12/09/2020 1621 by Jerral Ralph, RN Outcome: Progressing   Problem: Clinical Measurements: Goal: Ability to maintain clinical measurements within normal limits will improve 12/09/2020 1630 by Jerral Ralph, RN Outcome: Progressing 12/09/2020 1621 by Jerral Ralph, RN Outcome: Progressing Goal: Will remain free from infection 12/09/2020 1630 by Jerral Ralph, RN Outcome: Progressing 12/09/2020 1621 by Jerral Ralph, RN Outcome: Progressing Goal: Diagnostic test results will improve 12/09/2020 1630 by Jerral Ralph, RN Outcome: Progressing 12/09/2020 1621 by Jerral Ralph, RN Outcome: Progressing Goal: Respiratory complications will improve 12/09/2020 1630 by Jerral Ralph, RN Outcome: Progressing 12/09/2020 1621 by Jerral Ralph, RN Outcome: Progressing Goal: Cardiovascular complication will be avoided 12/09/2020 1630 by Jerral Ralph, RN Outcome: Progressing 12/09/2020 1621 by Jerral Ralph, RN Outcome: Progressing   Problem: Activity: Goal: Risk for activity intolerance will decrease 12/09/2020 1630 by Jerral Ralph, RN Outcome: Progressing 12/09/2020 1621 by Jerral Ralph, RN Outcome: Progressing   Problem: Nutrition: Goal: Adequate nutrition will be maintained 12/09/2020 1630 by Jerral Ralph, RN Outcome: Progressing 12/09/2020 1621 by Jerral Ralph, RN Outcome: Progressing   Problem: Coping: Goal: Level of anxiety will decrease 12/09/2020 1630 by Jerral Ralph, RN Outcome: Progressing 12/09/2020 1621 by Jerral Ralph, RN Outcome: Progressing   Problem: Elimination: Goal: Will not experience complications related to bowel motility 12/09/2020 1630 by Jerral Ralph, RN Outcome: Progressing 12/09/2020 1621 by Jerral Ralph, RN Outcome: Progressing Goal: Will not experience complications related to urinary retention 12/09/2020 1630 by Jerral Ralph, RN Outcome: Progressing 12/09/2020 1621 by Jerral Ralph, RN Outcome: Progressing   Problem: Pain Managment: Goal: General experience of comfort will improve 12/09/2020 1630 by Jerral Ralph, RN Outcome: Progressing 12/09/2020 1621 by Jerral Ralph, RN Outcome: Progressing   Problem: Safety: Goal: Ability to remain free from injury will improve 12/09/2020 1630 by Jerral Ralph, RN Outcome: Progressing 12/09/2020 1621 by Jerral Ralph, RN Outcome: Progressing   Problem: Skin Integrity: Goal: Risk for impaired skin integrity will decrease 12/09/2020 1630 by Jerral Ralph, RN Outcome: Progressing 12/09/2020 1621 by Jerral Ralph, RN Outcome: Progressing

## 2020-12-10 DIAGNOSIS — R531 Weakness: Secondary | ICD-10-CM

## 2020-12-10 LAB — COMPREHENSIVE METABOLIC PANEL
ALT: 19 U/L (ref 0–44)
AST: 35 U/L (ref 15–41)
Albumin: 2.6 g/dL — ABNORMAL LOW (ref 3.5–5.0)
Alkaline Phosphatase: 72 U/L (ref 38–126)
Anion gap: 4 — ABNORMAL LOW (ref 5–15)
BUN: 20 mg/dL (ref 8–23)
CO2: 20 mmol/L — ABNORMAL LOW (ref 22–32)
Calcium: 8.1 mg/dL — ABNORMAL LOW (ref 8.9–10.3)
Chloride: 110 mmol/L (ref 98–111)
Creatinine, Ser: 0.93 mg/dL (ref 0.61–1.24)
GFR, Estimated: >60 mL/min (ref >60)
Glucose, Bld: 93 mg/dL (ref 70–99)
Potassium: 3.8 mmol/L (ref 3.5–5.1)
Sodium: 134 mmol/L — ABNORMAL LOW (ref 135–145)
Total Bilirubin: 2 mg/dL — ABNORMAL HIGH (ref 0.3–1.2)
Total Protein: 4.7 g/dL — ABNORMAL LOW (ref 6.5–8.1)

## 2020-12-10 LAB — CBC
HCT: 33.1 % — ABNORMAL LOW (ref 39.0–52.0)
Hemoglobin: 11.6 g/dL — ABNORMAL LOW (ref 13.0–17.0)
MCH: 32.1 pg (ref 26.0–34.0)
MCHC: 35 g/dL (ref 30.0–36.0)
MCV: 91.7 fL (ref 80.0–100.0)
Platelets: 76 10*3/uL — ABNORMAL LOW (ref 150–400)
RBC: 3.61 MIL/uL — ABNORMAL LOW (ref 4.22–5.81)
RDW: 13 % (ref 11.5–15.5)
WBC: 9.4 10*3/uL (ref 4.0–10.5)
nRBC: 0 % (ref 0.0–0.2)

## 2020-12-10 MED ORDER — BISACODYL 10 MG RE SUPP
10.0000 mg | Freq: Once | RECTAL | Status: AC
  Administered 2020-12-10: 10 mg via RECTAL
  Filled 2020-12-10: qty 1

## 2020-12-10 NOTE — Plan of Care (Signed)
  Problem: Safety: Goal: Non-violent Restraint(s) Outcome: Progressing   Problem: Education: Goal: Knowledge of General Education information will improve Description: Including pain rating scale, medication(s)/side effects and non-pharmacologic comfort measures Outcome: Progressing   Problem: Health Behavior/Discharge Planning: Goal: Ability to manage health-related needs will improve Outcome: Progressing   Problem: Clinical Measurements: Goal: Ability to maintain clinical measurements within normal limits will improve Outcome: Progressing Goal: Will remain free from infection Outcome: Progressing Goal: Diagnostic test results will improve Outcome: Progressing Goal: Respiratory complications will improve Outcome: Progressing Goal: Cardiovascular complication will be avoided Outcome: Progressing   Problem: Activity: Goal: Risk for activity intolerance will decrease Outcome: Progressing   Problem: Nutrition: Goal: Adequate nutrition will be maintained Outcome: Progressing   Problem: Coping: Goal: Level of anxiety will decrease Outcome: Progressing   Problem: Elimination: Goal: Will not experience complications related to bowel motility Outcome: Progressing Goal: Will not experience complications related to urinary retention Outcome: Progressing   Problem: Pain Managment: Goal: General experience of comfort will improve Outcome: Progressing   Problem: Safety: Goal: Ability to remain free from injury will improve Outcome: Progressing   Problem: Skin Integrity: Goal: Risk for impaired skin integrity will decrease Outcome: Progressing   Problem: Education: Goal: Knowledge of disease or condition will improve Outcome: Progressing Goal: Knowledge of secondary prevention will improve Outcome: Progressing Goal: Knowledge of patient specific risk factors addressed and post discharge goals established will improve Outcome: Progressing   Problem: Coping: Goal: Will  verbalize positive feelings about self Outcome: Progressing Goal: Will identify appropriate support needs Outcome: Progressing   Problem: Health Behavior/Discharge Planning: Goal: Ability to manage health-related needs will improve Outcome: Progressing   Problem: Self-Care: Goal: Ability to participate in self-care as condition permits will improve Outcome: Progressing Goal: Verbalization of feelings and concerns over difficulty with self-care will improve Outcome: Progressing Goal: Ability to communicate needs accurately will improve Outcome: Progressing   Problem: Nutrition: Goal: Risk of aspiration will decrease Outcome: Progressing Goal: Dietary intake will improve Outcome: Progressing   Problem: Ischemic Stroke/TIA Tissue Perfusion: Goal: Complications of ischemic stroke/TIA will be minimized Outcome: Progressing

## 2020-12-10 NOTE — Progress Notes (Signed)
Physical Therapy Treatment Patient Details Name: [name] MRN: [id number] DOB: [birth year] Today's Date: 12/10/2020    History of Present Illness 75 yo male with onset of gradually increasing weakness was brought to ED.  Pt has history of being able to do yardwork the day before arrival but then global weakness occurred.  Pt reports tingling pain on R posterior neck and upper thoracic spine.  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   Differential dx now is between myelitis and spinal stroke.  PMHx:  atherosclerosis, spinal stenosis C7-T1,    PT Comments    Patient received in bed, very pleasant and cooperative with therapy and excited to tell us about his family in [place]. Performed stretching and PROM to B ankles and knees, skin inspection with no sign of heel sores but will plan to order gray PRAFOs as a precautions. Requires totalAx2 for all aspects of bed mobility, but once at EOB was able to maintain balance for about 10 minutes with MinA! However as fatigue increased he did need ModA, then MaxA for sitting balance due to posterior and right lean. Did have orthostatic drop today- see below. Left in bed positioned to comfort with family present and all needs otherwise met. Continue to recommend CIR.    Supine 121/63 Sitting EOB 152/80 Sitting EOB x 10 minutes 123/68 symptomatic  Return to bed with HOB at 59 degrees 133/70 asymptomatic     Follow Up Recommendations  CIR     Equipment Recommendations  Hospital bed;Wheelchair (measurements PT);Wheelchair cushion (measurements PT);3in1 (PT);Other (comment) (hoyer lift and pads, TIS vs power WC pending progress, all equipment drop arm)    Recommendations for Other Services       Precautions / Restrictions Precautions Precautions: Back Precaution Booklet Issued: No Precaution Comments: pathologic thoracic  fracture- back precautions for safety Restrictions Weight Bearing Restrictions: No    Mobility  Bed Mobility Overal bed mobility: Needs Assistance Bed Mobility: Rolling;Sidelying to Sit;Sit to Sidelying Rolling: +2 for physical assistance;Total assist Sidelying to sit: Total assist;+2 for physical assistance     Sit to sidelying: Total assist;+2 for physical assistance General bed mobility comments: totalAx2 for good technique and appropriate form, also to maintain back precautions    Transfers                 General transfer comment: deferred- safety  Ambulation/Gait             General Gait Details: unable   Stairs             Wheelchair Mobility    Modified Rankin (Stroke Patients Only)       Balance Overall balance assessment: Needs assistance   Sitting balance-Leahy Scale: Poor Sitting balance - Comments: MinA to maintain balance for first 10 minutes at EOB, progressed to [place] then MaxA as fatigue increased       Standing balance comment: unable to attempt                            Cognition Arousal/Alertness: Awake/alert Behavior During Therapy: WFL for tasks assessed/performed;Flat affect Overall Cognitive Status: Within Functional Limits for tasks assessed  General Comments: polite and cooperative with therapy, but very flat. Might benefit from chaplain to assist with coping with brand new normal      Exercises      General Comments General comments (skin integrity, edema, etc.): BP supine 121/63, sitting 152/80, sitting x10 minutes 123/68, return to supine with HOB at 59 degrees 133/70      Pertinent Vitals/Pain Pain Assessment: Faces Faces Pain Scale: Hurts little more Pain Location: R posterior neck and upper back, B shoulders Pain Descriptors / Indicators: Sore;Grimacing;Tightness;Stabbing Pain Intervention(s): Limited activity within patient's tolerance;Monitored  during session;Repositioned    Home Living                      Prior Function            PT Goals (current goals can now be found in the care plan section) Acute Rehab PT Goals Patient Stated Goal: to be able to move PT Goal Formulation: With patient Time For Goal Achievement: 12/22/20 Potential to Achieve Goals: Fair Progress towards PT goals: Progressing toward goals    Frequency    Min 4X/week      PT Plan Current plan remains appropriate;Equipment recommendations need to be updated    Co-evaluation              AM-PAC PT "6 Clicks" Mobility   Outcome Measure  Help needed turning from your back to your side while in a flat bed without using bedrails?: Total Help needed moving from lying on your back to sitting on the side of a flat bed without using bedrails?: Total Help needed moving to and from a bed to a chair (including a wheelchair)?: Total Help needed standing up from a chair using your arms (e.g., wheelchair or bedside chair)?: Total Help needed to walk in hospital room?: Total Help needed climbing 3-5 steps with a railing? : Total 6 Click Score: 6    End of Session   Activity Tolerance: Patient tolerated treatment well Patient left: in bed;with call bell/phone within reach;with bed alarm set;with family/visitor present Nurse Communication: Mobility status PT Visit Diagnosis: Muscle weakness (generalized) (M62.81);Pain;Other abnormalities of gait and mobility (R26.89) Pain - part of body:  (neck and B upper back)     Time: 1218-1257 PT Time Calculation (min) (ACUTE ONLY): 39 min  Charges:  $Therapeutic Activity: 8-22 mins (co-tx with OT) $Neuromuscular Re-education: 8-22 mins                    Kristen U PT, DPT, PN2   Supplemental Physical Therapist [place]    Pager [phone] Acute Rehab Office [phone]  

## 2020-12-10 NOTE — Progress Notes (Signed)
Inpatient Rehabilitation Admissions Coordinator    Inpatient rehab consult received. I met with patient at bedside for rehab assessment. We discussed a possible CIR admit pending his progress and further discussions with his family on caregiver supports available.  His family to visit later today. I will return tomorrow to meet with family and continue rehab venue assessments.  Danne Baxter, RN, MSN Rehab Admissions Coordinator [phone] 12/10/2020 12:55 PM

## 2020-12-10 NOTE — Progress Notes (Signed)
STROKE TEAM PROGRESS NOTE   ATTENDING NOTE: I reviewed above note and agree with the assessment and plan. Pt was seen and examined.   Son and daughter-in-law are at bedside.  Patient lying in bed, bilateral upper extremity strength continue to improve, more muscle strength at bedside and triceps bilaterally, but still no movement in both lower extremities.  However, patient in good spirit and determined to work with PT OT for further improvement.  Continue DAPT and statin for stroke prevention.  Discussed with EP, will implant loop recorder before discharge.  PT/OT recommend CIR.    Neurology will sign off. Please call with questions. Pt will follow up with stroke clinic Dr. Pearlean Brownie at [facility] in about 4 weeks. Thanks for the consult.  Marvel Plan, MD PhD Stroke Neurology 12/10/2020 7:21 PM    Brief history:  75 year old male with no known significant PMH presented to ER for acute onset bilateral shoulder pain followed by quadriparesis.  Per patient and wife, Friday early morning at 3:30 AM, patient complaining of bilateral shoulder pain, between shoulder blade pain.  In the morning, he got up, walked to the kitchen making coffee, he was able to walk with no obvious difficulty.  After making coffee, he felt whole-body weakness, bilateral shoulder pain spreading to left chest.  She went back to bed, and wife called 911 about 15 to 20 minutes later.  On EMS arrival, patient tried to get up to go to ambulance, he felt significant lower extremity weakness, not able to hold up, he fell. He has bilateral upper extremity also in flexion position, feeling weak. The symptoms develop from normal baseline to quadriplegia in about 30 minutes.    INTERVAL HISTORY No acute events. No visitors at bedside.  Patient is resting in bed with no new concerns. He feels his arms are slightly stronger today. He is frustrated and a bit tearful about needing assistance for his needs. Reassurance was provided. We discussed his  current plan of care including newest results and plan to hold off on LP for present in setting of thrombocytopenia. His questions were answered. Discussed need with Jonny Ruiz, RN, and orders placed for soft touch call light, strict turning schedule, heel protectors, ongoing skin care assessments, bed near to nurse's station, and feeding assistance requirements.   Vitals:   12/10/20 0400 12/10/20 0743 12/10/20 1220 12/10/20 1612  BP: 120/63 (!) 149/79 123/68 (!) 104/54  Pulse: 63 62 67 (!) 55  Resp: 17 17 18 13   Temp: 98.9 F (37.2 C) 98.7 F (37.1 C) 98.4 F (36.9 C) 98.6 F (37 C)  TempSrc: Oral Oral Oral Oral  SpO2: 98% 100% 97% 96%  Weight:      Height:       CBC:  Recent Labs  Lab 12/07/20 1207 12/08/20 1105 12/09/20 0948 12/10/20 0209  WBC 8.8   < > 9.9 9.4  NEUTROABS 7.7  --  8.2*  --   HGB 15.2   < > 12.3* 11.6*  HCT 43.1   < > 34.8* 33.1*  MCV 90.4   < > 91.8 91.7  PLT 103*   < > 75* 76*   < > = values in this interval not displayed.   Basic Metabolic Panel:  Recent Labs  Lab 12/09/20 0113 12/10/20 0209  NA 136 134*  K 4.1 3.8  CL 111 110  CO2 19* 20*  GLUCOSE 124* 93  BUN 20 20  CREATININE 0.88 0.93  CALCIUM 8.2* 8.1*   Lipid Panel:  Recent Labs  Lab 12/08/20 0507  CHOL 231*  TRIG 66  HDL 38*  CHOLHDL 6.1  VLDL 13  LDLCALC 160*   HgbA1c:  Recent Labs  Lab 12/08/20 0507  HGBA1C 5.5   Urine Drug Screen:  Recent Labs  Lab 12/07/20 2210  LABOPIA NONE DETECTED  COCAINSCRNUR NONE DETECTED  LABBENZ NONE DETECTED  AMPHETMU NONE DETECTED  THCU NONE DETECTED  LABBARB NONE DETECTED    Alcohol Level  Recent Labs  Lab 12/07/20 1136  ETH <10    IMAGING past 24 hours No results found.  PHYSICAL EXAM Constitutional: Appears well-developed and well-nourished. Psych: Affect appropriate to situation MSK: no joint deformities. Cardiovascular: Normal rate and regular rhythm. Respiratory: Effort normal, non-labored breathing Skin: Warm, dry  and intact    Neuro: Mental Status: Patient is awake, alert, oriented to person, place, month, year, and situation. Patient is able to give a clear and coherent history. No signs of aphasia or neglect Cranial Nerves: II: Visual Fields are full. Pupils are equal, round, and reactive to light.   III,IV, VI: EOMI without ptosis or diploplia. V: Facial sensation is symmetric to temperature VII: Facial movement is symmetric. VIII: hearing is intact to voice X: Uvula elevates symmetrically XI: Shoulder shrug is symmetric. XII: tongue is midline without atrophy or fasciculations. Motor: Tone is normal. Bulk is normal. Bilateral upper extremities in flexion position with increased tone, bilateral proximal 2/5. Distal 1/5. 0/5 bilateral bilat LE throughout. Sensory: He has absent temperature to the C5 level, preserved proprioception and fine discriminatory touch. Deep Tendon Reflexes: Diminished throughout Cerebellar: Unable to perform  ASSESSMENT/PLAN [name] is a 75 y.o. 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 Less likely differential for spinal cord symptoms may also include myelitis, MS, Sarcoidosis.   Code Stroke CT head  No acute abnormality.  MRI   6 mm acute left perirolandic infarct. Mild chronic small vessel ischemic disease. MR Cervical Spine Lesion involving the T1 vertebral body and right-sided posterior elements with associated pathologic vertebral fracture concerning for metastatic disease or other neoplasm. 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 Abnormal T2 hyperintensity involving the central and ventral  aspects of the spinal cord from C5 into the included upper thoracic spine. Considerations include cord infarct, transverse myelitis, viral myelitis, and other inflammatory processes such as sarcoidosis. No enhancement or masslike finding to suggest intramedullary cord tumor although thoracic spine imaging is recommended for further evaluation. Disc and facet degeneration resulting in severe multilevel neural foraminal stenosis as above. Mild-to-moderate spinal stenosis at C7-T1. MR  Thoracic Spine Nondiagnostic attempted diffusion-weighted imaging of the cervical and thoracic spine. Utilization of a more tailored spine MRI protocol may be helpful if this can be made available. Unchanged long segment central cervical spinal cord signal abnormality, which terminates at the T1-2 level. No signal abnormality within the remainder of the thoracic spinal cord. Unchanged appearance of possible pathologic fracture at T1. CT CAP No primary malignancy identified in the chest, abdomen or pelvis. Lucent lesion of T1 height and small superior endplate defect, nonspecific. No other focal osseous lesions. Bilateral mild perinephric stranding, nonspecific. Enlarged prostate measuring 5.8 cm transverse dimension CTA Head and Neck Minimal atherosclerosis in the  head and neck without large vessel occlusion or significant proximal stenosis. Lucency in the T1 vertebral body with posterior cortical erosion and superior endplate fracture as seen on MRI. This remains more concerning for neoplasm (metastatic disease, lymphoma, myeloma) as opposed to a benign bone infarct. Carotid Doppler final read Pending Bilat LE duplex with final read Pending 2D Echo: EF 60-65%, No thrombus, wall motion abnormality or shunt found.   May need LP when INR/thrombocytopenia within normal limits Recommend 30-day Cardic event monitor versus loop recorder to rule out cardioembolic source. LDL 180 HgbA1c 5.5 VTE prophylaxis -  recommended    Diet   DIET DYS 3 Room service appropriate? Yes; Fluid consistency: Thin  Currently on ASA 81mg  daily. Avoid DAPT at this time in case patient needed LP and CSF analysis.   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 After hours, contact General Neurology

## 2020-12-10 NOTE — Progress Notes (Signed)
Orthopedic Tech Progress Note Patient Details:  [name] 1945/09/27 [id number]  Ortho Devices Type of Ortho Device: Prafo boot/shoe Ortho Device/Splint Location: BLE Ortho Device/Splint Interventions: Ordered, Application, Adjustment   Post Interventions Patient Tolerated: Well Instructions Provided: Care of device  [name] 12/10/2020, 3:52 PM

## 2020-12-10 NOTE — Progress Notes (Signed)
[facility]  PROGRESS NOTE  [name] QAS:[id number] DOB: 06-14-1945 DOA: 12/07/2020 PCP: Pcp, No   Brief HPI:   75 year old male with no past medical history presented with weakness  MRI showed acute stroke.  He was seen by neurology.  MRI of C-spine suggested metastatic disease.  Patient was hospitalized for further management.    Subjective   Patient seen and examined, says that strength in upper extremities has improved.  Able to move both upper extremities.   Assessment/Plan:     Stroke -Presented with significant weakness of bilateral upper and lower extremities -Concern for spinal stroke -Carotid duplex was negative -Venous duplex of lower extremities was negative for DVT -Neurology following, started on dual antiplatelet therapy -Plan to go to CIR  Abnormal MRI cervical findings -Concern for metastatic lesion/enlarged prostate -CT scan abdomen/pelvis showed prostate gland to be enlarged -PSA was elevated at 67.79 -Oncology was consulted, they recommended bone scan -We will order bone scan  Thrombocytopenia -Patient had low platelet count; 81,000 -DIC panel showed elevated D-dimer, low fibrinogen along with schistocytes -?  DIC, hematology/oncology consulted -Okay to start DAPT per oncology  Urinary retention -Multiple and out catheterizations in the hospital -Secondary to spinal cord lesion as above -Foley catheter placed  Hyperbilirubinemia -Indirect bilirubin 1.8 -Could be from mild hemolysis;  -peripheral blood smear showed schistocytes -Follow LFTs in a.m.   Scheduled medications:    aspirin EC  81 mg Oral Daily   atorvastatin  80 mg Oral Daily   bisacodyl  10 mg Rectal Once   Chlorhexidine Gluconate Cloth  6 each Topical Daily   clopidogrel  75 mg Oral Daily         Data Reviewed:   CBG:  Recent Labs  Lab 12/07/20 1151  GLUCAP 111*    SpO2: 97 %    Vitals:   12/10/20 0000 12/10/20 0400 12/10/20 0743 12/10/20 1220  BP:  130/74 120/63 (!) 149/79 123/68  Pulse: 77 63 62 67  Resp: 17 17 17 18   Temp: 100 F (37.8 C) 98.9 F (37.2 C) 98.7 F (37.1 C) 98.4 F (36.9 C)  TempSrc: Oral Oral Oral Oral  SpO2: 96% 98% 100% 97%  Weight:      Height:         Intake/Output Summary (Last 24 hours) at 12/10/2020 1453 Last data filed at 12/10/2020 0930 Gross per 24 hour  Intake 120 ml  Output 600 ml  Net -480 ml    08/06 1901 - 08/08 0700 In: -  Out: 850 [Urine:850]  Filed Weights   12/08/20 0907  Weight: 72.6 kg    CBC:  Recent Labs  Lab 12/07/20 1207 12/08/20 1105 12/08/20 1411 12/09/20 0113 12/09/20 0948 12/10/20 0209  WBC 8.8 8.3  --  8.8 9.9 9.4  HGB 15.2 13.5  --  11.9* 12.3* 11.6*  HCT 43.1 38.6*  --  34.7* 34.8* 33.1*  PLT 103* 81* 75* 78* 75* 76*  MCV 90.4 92.3  --  91.6 91.8 91.7  MCH 31.9 32.3  --  31.4 32.5 32.1  MCHC 35.3 35.0  --  34.3 35.3 35.0  RDW 12.6 13.2  --  13.2 13.2 13.0  LYMPHSABS 0.3*  --   --   --  0.4*  --   MONOABS 0.6  --   --   --  1.2*  --   EOSABS 0.0  --   --   --  0.0  --   BASOSABS 0.0  --   --   --  0.0  --     Complete metabolic panel:  Recent Labs  Lab 12/07/20 1207 12/08/20 0507 12/08/20 1105 12/08/20 1411 12/09/20 0113 12/09/20 0948 12/10/20 0209  NA 135  --   --   --  136  --  134*  K 4.1  --   --   --  4.1  --  3.8  CL 102  --   --   --  111  --  110  CO2 23  --   --   --  19*  --  20*  GLUCOSE 124*  --   --   --  124*  --  93  BUN 13  --   --   --  20  --  20  CREATININE 1.00  --   --   --  0.88  --  0.93  CALCIUM 9.6  --   --   --  8.2*  --  8.1*  AST 30  --   --   --  40  --  35  ALT 13  --   --   --  20  --  19  ALKPHOS 53  --   --   --  70  --  72  BILITOT 2.9*  --   --   --  2.2* 2.2* 2.0*  ALBUMIN 4.0  --   --   --  2.8*  --  2.6*  DDIMER  --   --   --  >20.00*  --   --   --   INR 1.8*  --  1.3* 1.2 1.2  --   --   TSH  --   --   --   --  1.128  --   --   HGBA1C  --  5.5  --   --   --   --   --     No results for  input(s): LIPASE, AMYLASE in the last 168 hours.  Recent Labs  Lab 12/07/20 1207 12/08/20 1411  DDIMER  --  >20.00*  SARSCOV2NAA NEGATIVE  --     ------------------------------------------------------------------------------------------------------------------ Recent Labs    12/08/20 0507  CHOL 231*  HDL 38*  LDLCALC 180*  TRIG 66  CHOLHDL 6.1    Lab Results  Component Value Date   HGBA1C 5.5 12/08/2020   ------------------------------------------------------------------------------------------------------------------ Recent Labs    12/09/20 0113  TSH 1.128   ------------------------------------------------------------------------------------------------------------------ Recent Labs    12/09/20 0113 12/09/20 0948  VITAMINB12 378  --   FOLATE 11.1  --   RETICCTPCT  --  1.5    Coagulation profile Recent Labs  Lab 12/07/20 1207 12/08/20 1105 12/08/20 1411 12/09/20 0113  INR 1.8* 1.3* 1.2 1.2   Recent Labs    12/08/20 1411  DDIMER >20.00*    Cardiac Enzymes Recent Labs  Lab 12/07/20 1207  CKTOTAL 82    ------------------------------------------------------------------------------------------------------------------ No results found for: BNP   Antibiotics: Anti-infectives (From admission, onward)    None        Radiology Reports  ECHOCARDIOGRAM COMPLETE  Result Date: 12/09/2020    ECHOCARDIOGRAM REPORT   Patient Name:   [name] Date of Exam: 12/09/2020 Medical Rec #:  [id number]    Height:       68.5 in Accession #:    [id number]   Weight:       160.0 lb Date of Birth:  03/12/1946    BSA:          1.869  m Patient Age:    75 years     BP:           169/91 mmHg Patient Gender: M            HR:           57 bpm. Exam Location:  Inpatient Procedure: 2D Echo, Cardiac Doppler and Color Doppler Indications:    CVA  History:        Patient has no prior history of Echocardiogram examinations.  Sonographer:    Neomia Dear RDCS Referring Phys: [id number]  Cecille Po MELVIN IMPRESSIONS  1. Left ventricular ejection fraction, by estimation, is 60 to 65%. The left ventricle has normal function. The left ventricle has no regional wall motion abnormalities. Left ventricular diastolic parameters were normal.  2. Right ventricular systolic function is normal. The right ventricular size is normal. There is normal pulmonary artery systolic pressure. The estimated right ventricular systolic pressure is 24.3 mmHg.  3. The mitral valve is normal in structure. No evidence of mitral valve regurgitation. No evidence of mitral stenosis.  4. The aortic valve is normal in structure. Aortic valve regurgitation is trivial. No aortic stenosis is present.  5. The inferior vena cava is normal in size with greater than 50% respiratory variability, suggesting right atrial pressure of 3 mmHg. Conclusion(s)/Recommendation(s): No evidence of valvular vegetations on this transthoracic echocardiogram. Consider a transesophageal echocardiogram to exclude infective endocarditis if clinically indicated. FINDINGS  Left Ventricle: Left ventricular ejection fraction, by estimation, is 60 to 65%. The left ventricle has normal function. The left ventricle has no regional wall motion abnormalities. The left ventricular internal cavity size was normal in size. There is  no left ventricular hypertrophy. Left ventricular diastolic parameters were normal. Right Ventricle: The right ventricular size is normal. No increase in right ventricular wall thickness. Right ventricular systolic function is normal. There is normal pulmonary artery systolic pressure. The tricuspid regurgitant velocity is 2.31 m/s, and  with an assumed right atrial pressure of 3 mmHg, the estimated right ventricular systolic pressure is 24.3 mmHg. Left Atrium: Left atrial size was normal in size. Right Atrium: Right atrial size was normal in size. Pericardium: There is no evidence of pericardial effusion. Mitral Valve: The mitral valve is  normal in structure. No evidence of mitral valve regurgitation. No evidence of mitral valve stenosis. MV peak gradient, 3.6 mmHg. The mean mitral valve gradient is 1.0 mmHg. Tricuspid Valve: The tricuspid valve is normal in structure. Tricuspid valve regurgitation is trivial. No evidence of tricuspid stenosis. Aortic Valve: The aortic valve is normal in structure. Aortic valve regurgitation is trivial. No aortic stenosis is present. Aortic valve mean gradient measures 3.0 mmHg. Aortic valve peak gradient measures 6.7 mmHg. Aortic valve area, by VTI measures 3.90 cm. Pulmonic Valve: The pulmonic valve was not well visualized. Pulmonic valve regurgitation is trivial. No evidence of pulmonic stenosis. Aorta: The aortic root is normal in size and structure. Venous: The inferior vena cava is normal in size with greater than 50% respiratory variability, suggesting right atrial pressure of 3 mmHg. IAS/Shunts: The atrial septum is grossly normal.  LEFT VENTRICLE PLAX 2D LVIDd:         4.70 cm  Diastology LVIDs:         3.50 cm  LV e' medial:    7.40 cm/s LV PW:         0.80 cm  LV E/e' medial:  9.3 LV IVS:        1.00  cm  LV e' lateral:   11.20 cm/s LVOT diam:     2.30 cm  LV E/e' lateral: 6.2 LV SV:         106 LV SV Index:   57 LVOT Area:     4.15 cm  RIGHT VENTRICLE RV Basal diam:  4.10 cm RV Mid diam:    2.60 cm RV S prime:     17.60 cm/s TAPSE (M-mode): 2.8 cm LEFT ATRIUM             Index       RIGHT ATRIUM           Index LA Vol (A2C):   53.9 ml 28.84 ml/m RA Area:     13.30 cm LA Vol (A4C):   44.9 ml 24.02 ml/m RA Volume:   29.30 ml  15.68 ml/m LA Biplane Vol: 51.5 ml 27.56 ml/m  AORTIC VALVE                   PULMONIC VALVE AV Area (Vmax):    3.90 cm    PV Vmax:       0.97 m/s AV Area (Vmean):   3.84 cm    PV Vmean:      65.200 cm/s AV Area (VTI):     3.90 cm    PV VTI:        0.208 m AV Vmax:           129.00 cm/s PV Peak grad:  3.7 mmHg AV Vmean:          83.400 cm/s PV Mean grad:  2.0 mmHg AV VTI:             0.272 m AV Peak Grad:      6.7 mmHg AV Mean Grad:      3.0 mmHg LVOT Vmax:         121.00 cm/s LVOT Vmean:        77.100 cm/s LVOT VTI:          0.255 m LVOT/AV VTI ratio: 0.94 AR Vena Contracta: 0.20 cm  AORTA Ao Root diam: 3.15 cm Ao Asc diam:  3.20 cm MITRAL VALVE               TRICUSPID VALVE MV Area (PHT): 3.30 cm    TR Peak grad:   21.3 mmHg MV Area VTI:   3.69 cm    TR Vmax:        231.00 cm/s MV Peak grad:  3.6 mmHg MV Mean grad:  1.0 mmHg    SHUNTS MV Vmax:       0.95 m/s    Systemic VTI:  0.26 m MV Vmean:      48.3 cm/s   Systemic Diam: 2.30 cm MV Decel Time: 230 msec MV E velocity: 69.00 cm/s MV A velocity: 63.40 cm/s MV E/A ratio:  1.09 Weston Brass MD Electronically signed by Weston Brass MD Signature Date/Time: 12/09/2020/1:04:12 PM    Final       DVT prophylaxis: SCDs  Code Status: DNR  Family Communication: No family at bedside   Consultants: Oncology Neurology  Procedures:     Objective    Physical Examination:  General-appears in no acute distress Heart-S1-S2, regular, no murmur auscultated Lungs-clear to auscultation bilaterally, no wheezing or crackles auscultated Abdomen-soft, nontender, no organomegaly Extremities-no edema in the lower extremities Neuro-alert, oriented x3, motor strength 4/5 in upper extremities, bilateral lower extremities 0/5  Status is: Inpatient  Dispo: The patient  is from: Home              Anticipated d/c is to: CIR              Anticipated d/c date is: 12/12/2020              Patient currently not stable for discharge  Barrier to discharge-ongoing work-up for malignancy  COVID-19 Labs  Recent Labs    12/08/20 1411  DDIMER >20.00*    Lab Results  Component Value Date   SARSCOV2NAA NEGATIVE 12/07/2020    Microbiology  Recent Results (from the past 240 hour(s))  Resp Panel by RT-PCR (Flu A&B, Covid) Nasopharyngeal Swab     Status: None   Collection Time: 12/07/20 12:07 PM   Specimen: Nasopharyngeal Swab;  Nasopharyngeal(NP) swabs in vial transport medium  Result Value Ref Range Status   SARS Coronavirus 2 by RT PCR NEGATIVE NEGATIVE Final    Comment: (NOTE) SARS-CoV-2 target nucleic acids are NOT DETECTED.  The SARS-CoV-2 RNA is generally detectable in upper respiratory specimens during the acute phase of infection. The lowest concentration of SARS-CoV-2 viral copies this assay can detect is 138 copies/mL. A negative result does not preclude SARS-Cov-2 infection and should not be used as the sole basis for treatment or other patient management decisions. A negative result may occur with  improper specimen collection/handling, submission of specimen other than nasopharyngeal swab, presence of viral mutation(s) within the areas targeted by this assay, and inadequate number of viral copies(<138 copies/mL). A negative result must be combined with clinical observations, patient history, and epidemiological information. The expected result is Negative.  Fact Sheet for Patients:  BloggerCourse.com  Fact Sheet for Healthcare Providers:  SeriousBroker.it  This test is no t yet approved or cleared by the [place] FDA and  has been authorized for detection and/or diagnosis of SARS-CoV-2 by FDA under an Emergency Use Authorization (EUA). This EUA will remain  in effect (meaning this test can be used) for the duration of the COVID-19 declaration under Section 564(b)(1) of the Act, 21 U.S.C.section 360bbb-3(b)(1), unless the authorization is terminated  or revoked sooner.       Influenza A by PCR NEGATIVE NEGATIVE Final   Influenza B by PCR NEGATIVE NEGATIVE Final    Comment: (NOTE) The Xpert Xpress SARS-CoV-2/FLU/RSV plus assay is intended as an aid in the diagnosis of influenza from Nasopharyngeal swab specimens and should not be used as a sole basis for treatment. Nasal washings and aspirates are unacceptable for Xpert Xpress  SARS-CoV-2/FLU/RSV testing.  Fact Sheet for Patients: BloggerCourse.com  Fact Sheet for Healthcare Providers: SeriousBroker.it  This test is not yet approved or cleared by the [place] FDA and has been authorized for detection and/or diagnosis of SARS-CoV-2 by FDA under an Emergency Use Authorization (EUA). This EUA will remain in effect (meaning this test can be used) for the duration of the COVID-19 declaration under Section 564(b)(1) of the Act, 21 U.S.C. section 360bbb-3(b)(1), unless the authorization is terminated or revoked.  Performed at [facility], [address], [place], [place] [address]              Meredeth Ide   [facility] If 7PM-7AM, please contact night-coverage at [email or web address], Office  [phone]   12/10/2020, 2:53 PM  LOS: 2 days

## 2020-12-10 NOTE — Progress Notes (Signed)
Occupational Therapy Treatment Patient Details Name: [name] MRN: [id number] DOB: 26-Apr-1946 Today's Date: 12/10/2020    History of present illness 75 yo male with onset of gradually increasing weakness was brought to ED.  Pt has history of being able to do yardwork the day before arrival but then global weakness occurred.  Pt reports tingling pain on R posterior neck and upper thoracic spine.  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   Differential dx now is between myelitis and spinal stroke.  PMHx:  atherosclerosis, spinal stenosis C7-T1,   OT comments  Patient progressing and showed improved ability to feed self with Max As while sitting EOB with use of RUE and red adaptive foam handle to fork as well as assistance from physical therapy with EOB sitting balance, compared to previous session. Pt tolerated ~6-7 bites while sitting for ~10-15 min then because fatigued with BP changes (see Vitals below) and was returned to supine.  Patient remains limited by global weakness with BLEs flaccid this visit and BUEs very limited with flexor tone and incomplete composite grips, along with deficits noted below. Pt continues to demonstrate good rehab potential and would benefit from continued skilled OT to increase safety and independence with ADLs and functional transfers to allow pt to return home safely and reduce caregiver burden and fall risk.   Follow Up Recommendations  CIR    Equipment Recommendations   (Will defer to post-acute recommendations.)    Recommendations for Other Services Rehab consult    Precautions / Restrictions Precautions Precautions: Back Precaution Booklet Issued: No Precaution Comments: pathologic thoracic fracture- back precautions for safety Restrictions Weight Bearing Restrictions: No       Mobility Bed Mobility Overal bed mobility: Needs  Assistance Bed Mobility: Rolling;Sidelying to Sit;Sit to Sidelying Rolling: +2 for physical assistance;Total assist Sidelying to sit: Total assist;+2 for physical assistance     Sit to sidelying: Total assist;+2 for physical assistance General bed mobility comments: totalAx2 for good technique and appropriate form, also to maintain back precautions    Transfers                 General transfer comment: deferred- safety    Balance Overall balance assessment: Needs assistance Sitting-balance support: Feet supported Sitting balance-Leahy Scale: Poor Sitting balance - Comments: Min A to maintain balance for first 10 minutes at EOB, progressed to ModA then MaxA as fatigue increased.       Standing balance comment: unable to attempt                           ADL either performed or assessed with clinical judgement   ADL Overall ADL's : Needs assistance/impaired Eating/Feeding: Maximal assistance;Sitting Eating/Feeding Details (indicate cue type and reason): While sitting EOB with support from physical therapy and cues as needed for balance, pt used RT hand with red adaptive foam handle on feeding utensils to work on self-feeding. Pt required assist to place food on fork as pt unabel to extend elbow sufficiently to reach plate. Pt then able to bring fork to mouth with Min As to steady RUE and pt compensating by bringing head down to hand. Pt performed this x 6-7 bites, and 2-3 sips of milk with Total assist to bring carton and straw to mouth using hand over hand to allow pt to  participate in task. Pt sat for task for 10-15 min then became fatigued and asked to return to supine. Pt was initially Min As to maintain EOB sittng, but with fatigue pt gradually declined to need of Max As with posterior and RT sided leaning.                                 Functional mobility during ADLs: Total assistance;+2 for physical assistance;Cueing for sequencing        Vision Baseline Vision/History: No visual deficits Patient Visual Report: No change from baseline     Perception     Praxis      Cognition Arousal/Alertness: Awake/alert Behavior During Therapy: WFL for tasks assessed/performed;Flat affect Overall Cognitive Status: Within Functional Limits for tasks assessed                                 General Comments: polite and cooperative with therapy, but very flat. Might benefit from chaplain to assist with coping with brand new normal. Pt expressed that he prefers to be called "Alinda Money".        Exercises Other Exercises Other Exercises: Per pt request, performed slow PROM to promote LT elbow extension with deep pressure to bicep to inhibit tone. After stretch, pt able to maintain elbow extension. Pt does endorse flexor tone to elbow with sleep, laughing, sneezing or coughing. Other Exercises: Pt shown cervical/head retraction exercises and pt able to demo back while sitting EOB with submaximal ROM. PT encouraged to continue to practice exercises in bed by pushing back of head into pillow and holding up to 5 seconds each rep.  Pt performed head rotations RT and LT but very limited with lateral cervical flexion to eaither side. Pt encouraged to move BUEs in bed ad lib but to rest and as needed and avoid over fatigue.   Vitals:   Supine at beginning of session: 121/63 Sitting EOB initially: 152/80 After 10-15 min sitting EOB: 123/68 and pt symptomatic, asking to lie down. Once returned to supine: 133/70.     General Comments BP supine 121/63, sitting 152/80, sitting x10 minutes 123/68, return to supine with HOB at 59 degrees 133/70    Pertinent Vitals/ Pain       Pain Assessment: Faces Faces Pain Scale: Hurts little more Pain Location: R posterior neck and upper back, B shoulders Pain Descriptors / Indicators: Sore;Grimacing;Tightness;Stabbing Pain Intervention(s): Limited activity within patient's tolerance;Monitored  during session;Repositioned  Home Living                                          Prior Functioning/Environment              Frequency  Min 2X/week        Progress Toward Goals  OT Goals(current goals can now be found in the care plan section)  Progress towards OT goals: Progressing toward goals  Acute Rehab OT Goals Patient Stated Goal: to be able to move; to eat wife's [place] cooking. OT Goal Formulation: With patient Time For Goal Achievement: 12/22/20 Potential to Achieve Goals: Good  Plan Discharge plan remains appropriate    Co-evaluation    PT/OT/SLP Co-Evaluation/Treatment: Yes Reason for Co-Treatment: Complexity of the patient's impairments (multi-system involvement);For patient/therapist safety;To address functional/ADL transfers  OT goals addressed during session: ADL's and self-care;Strengthening/ROM      AM-PAC OT "6 Clicks" Daily Activity     Outcome Measure   Help from another person eating meals?: A Lot Help from another person taking care of personal grooming?: Total Help from another person toileting, which includes using toliet, bedpan, or urinal?: Total Help from another person bathing (including washing, rinsing, drying)?: Total Help from another person to put on and taking off regular upper body clothing?: Total Help from another person to put on and taking off regular lower body clothing?: Total 6 Click Score: 7    End of Session    OT Visit Diagnosis: Muscle weakness (generalized) (M62.81)   Activity Tolerance Patient limited by pain;Patient limited by fatigue   Patient Left in bed;with call bell/phone within reach;with family/visitor present;with bed alarm set   Nurse Communication Mobility status        Time: 2671-2458 OT Time Calculation (min): 39 min  Charges: OT General Charges $OT Visit: 1 Visit OT Treatments $Self Care/Home Management : 8-22 mins  [name] Dike, OT Acute Rehab Services Office:  [phone] 12/10/2020  Theodoro Clock 12/10/2020, 2:11 PM

## 2020-12-11 ENCOUNTER — Inpatient Hospital Stay (HOSPITAL_COMMUNITY): Payer: Medicare Other

## 2020-12-11 ENCOUNTER — Encounter (HOSPITAL_COMMUNITY): Payer: Self-pay | Admitting: Internal Medicine

## 2020-12-11 DIAGNOSIS — E7800 Pure hypercholesterolemia, unspecified: Secondary | ICD-10-CM

## 2020-12-11 DIAGNOSIS — I639 Cerebral infarction, unspecified: Secondary | ICD-10-CM | POA: Diagnosis not present

## 2020-12-11 DIAGNOSIS — G9511 Acute infarction of spinal cord (embolic) (nonembolic): Secondary | ICD-10-CM

## 2020-12-11 LAB — CBC
HCT: 34.5 % — ABNORMAL LOW (ref 39.0–52.0)
Hemoglobin: 11.7 g/dL — ABNORMAL LOW (ref 13.0–17.0)
MCH: 31.5 pg (ref 26.0–34.0)
MCHC: 33.9 g/dL (ref 30.0–36.0)
MCV: 93 fL (ref 80.0–100.0)
Platelets: 80 10*3/uL — ABNORMAL LOW (ref 150–400)
RBC: 3.71 MIL/uL — ABNORMAL LOW (ref 4.22–5.81)
RDW: 13.2 % (ref 11.5–15.5)
WBC: 9.7 10*3/uL (ref 4.0–10.5)
nRBC: 0 % (ref 0.0–0.2)

## 2020-12-11 LAB — COMPREHENSIVE METABOLIC PANEL
ALT: 19 U/L (ref 0–44)
AST: 28 U/L (ref 15–41)
Albumin: 2.5 g/dL — ABNORMAL LOW (ref 3.5–5.0)
Alkaline Phosphatase: 67 U/L (ref 38–126)
Anion gap: 6 (ref 5–15)
BUN: 20 mg/dL (ref 8–23)
CO2: 21 mmol/L — ABNORMAL LOW (ref 22–32)
Calcium: 8 mg/dL — ABNORMAL LOW (ref 8.9–10.3)
Chloride: 108 mmol/L (ref 98–111)
Creatinine, Ser: 0.94 mg/dL (ref 0.61–1.24)
GFR, Estimated: >60 mL/min (ref >60)
Glucose, Bld: 102 mg/dL — ABNORMAL HIGH (ref 70–99)
Potassium: 3.8 mmol/L (ref 3.5–5.1)
Sodium: 135 mmol/L (ref 135–145)
Total Bilirubin: 1.5 mg/dL — ABNORMAL HIGH (ref 0.3–1.2)
Total Protein: 4.6 g/dL — ABNORMAL LOW (ref 6.5–8.1)

## 2020-12-11 LAB — PATHOLOGIST SMEAR REVIEW

## 2020-12-11 IMAGING — NM NM BONE WHOLE BODY
4 series · 4 of 4 positions shown · non-contrast
Comparison: Cervical MRI [DATE]

CLINICAL DATA: Bone lesions on MRI. Concern for metastatic skeletal
disease.

EXAM:
NUCLEAR MEDICINE WHOLE BODY BONE SCAN
TECHNIQUE: Whole body anterior and posterior images were obtained approximately
3 hours after intravenous injection of radiopharmaceutical.
RADIOPHARMACEUTICALS:  Twenty mCi [RP] MDP IV

[Series 1: wbr_bone_60 whole body · 2.66mm/px · 1 of 1 slices shown (1 of 2)]
[im 1/1]
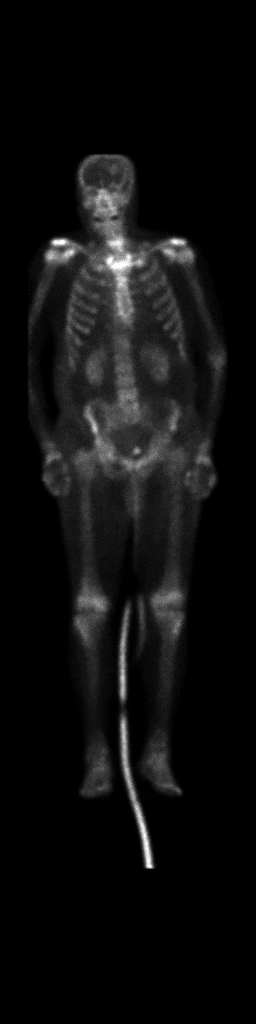

[Series 1: whole body · 2.66mm/px · 1 of 1 slices shown (1 of 2)]
[im 1/1]
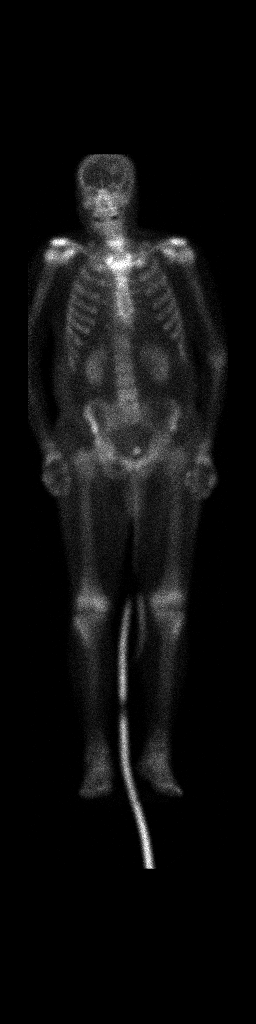

[Series 1: wbr_bone_60 whole body · 2.66mm/px · 1 of 1 slices shown (2 of 2)]
[im 1/1]
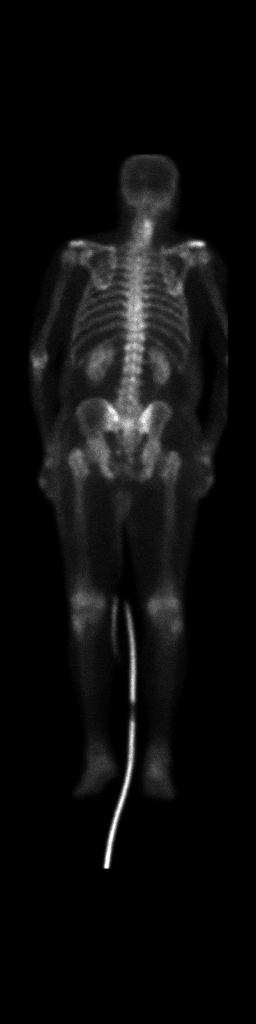

[Series 1: whole body · 2.66mm/px · 1 of 1 slices shown (2 of 2)]
[im 1/1]
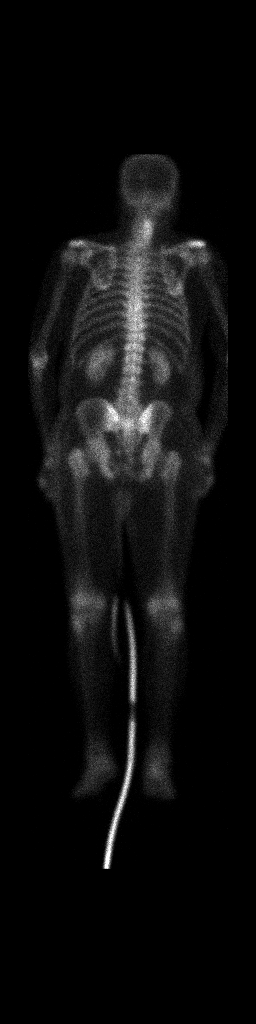

[4 of 4 positions shown; findings below may reference images not displayed]

FINDINGS: There is a linear segment of abnormal radiotracer activity within
the posterior RIGHT cervical paraspinal musculature which
corresponds to abnormality within the paraspinal musculature on
comparison MRI (image 27/21 on [DATE] and image 32/8 on MRI
[DATE]).

Activity at the level the thoracic inlet anteriorly may correlate
the T1 lesion described on comparison MRIs.

Elsewhere within the skeleton there is no foci of abnormal
radiotracer activity.
IMPRESSION: 1. Linear uptake within the paraspinal musculature pf RIGHT cervical
spine posteriorly corresponds to abnormal signal intensity on
comparison MRI.
2. Focal uptake anterior to the thoracic inlet suggest correlation
with the T1 vertebral body lesion.
3. Above findings may relate to acute trauma, infection, or
neoplasm.
4. No additional evidence of metastatic skeletal disease.

## 2020-12-11 MED ORDER — ENOXAPARIN SODIUM 40 MG/0.4ML IJ SOSY
40.0000 mg | PREFILLED_SYRINGE | INTRAMUSCULAR | Status: DC
  Administered 2020-12-12: 40 mg via SUBCUTANEOUS
  Filled 2020-12-11: qty 0.4

## 2020-12-11 MED ORDER — TECHNETIUM TC 99M MEDRONATE IV KIT
20.0000 | PACK | Freq: Once | INTRAVENOUS | Status: AC | PRN
  Administered 2020-12-11: 20 via INTRAVENOUS

## 2020-12-11 NOTE — Plan of Care (Signed)
  Problem: Education: Goal: Knowledge of General Education information will improve Description: Including pain rating scale, medication(s)/side effects and non-pharmacologic comfort measures Outcome: Progressing   Problem: Health Behavior/Discharge Planning: Goal: Ability to manage health-related needs will improve Outcome: Progressing   Problem: Clinical Measurements: Goal: Ability to maintain clinical measurements within normal limits will improve Outcome: Progressing Goal: Will remain free from infection Outcome: Progressing Goal: Diagnostic test results will improve Outcome: Progressing Goal: Respiratory complications will improve Outcome: Progressing Goal: Cardiovascular complication will be avoided Outcome: Progressing   Problem: Activity: Goal: Risk for activity intolerance will decrease Outcome: Progressing   Problem: Nutrition: Goal: Adequate nutrition will be maintained Outcome: Progressing   Problem: Coping: Goal: Level of anxiety will decrease Outcome: Progressing   Problem: Elimination: Goal: Will not experience complications related to bowel motility Outcome: Progressing Goal: Will not experience complications related to urinary retention Outcome: Progressing   Problem: Pain Managment: Goal: General experience of comfort will improve Outcome: Progressing   Problem: Safety: Goal: Ability to remain free from injury will improve Outcome: Progressing   Problem: Skin Integrity: Goal: Risk for impaired skin integrity will decrease Outcome: Progressing   Problem: Education: Goal: Knowledge of disease or condition will improve Outcome: Progressing Goal: Knowledge of secondary prevention will improve Outcome: Progressing Goal: Knowledge of patient specific risk factors addressed and post discharge goals established will improve Outcome: Progressing   Problem: Coping: Goal: Will verbalize positive feelings about self Outcome: Progressing Goal: Will  identify appropriate support needs Outcome: Progressing   Problem: Health Behavior/Discharge Planning: Goal: Ability to manage health-related needs will improve Outcome: Progressing   Problem: Self-Care: Goal: Ability to participate in self-care as condition permits will improve Outcome: Progressing Goal: Verbalization of feelings and concerns over difficulty with self-care will improve Outcome: Progressing Goal: Ability to communicate needs accurately will improve Outcome: Progressing   Problem: Nutrition: Goal: Risk of aspiration will decrease Outcome: Progressing Goal: Dietary intake will improve Outcome: Progressing   Problem: Ischemic Stroke/TIA Tissue Perfusion: Goal: Complications of ischemic stroke/TIA will be minimized Outcome: Progressing   

## 2020-12-11 NOTE — Progress Notes (Signed)
SLP Cancellation Note  Patient Details Name: [name] MRN: [id number] DOB: 02-24-1946   Cancelled treatment:       Reason Eval/Treat Not Completed: Patient at procedure or test/unavailable;Other (comment) (pt at nuclear medicine getting bone scan done, will continue efforts) Rolena Infante, MS [facility] SLP Acute Rehab Services Office [phone] Pager [phone]   Chales Abrahams 12/11/2020, 11:37 AM

## 2020-12-11 NOTE — Progress Notes (Signed)
STROKE TEAM PROGRESS NOTE   Brief history:  75 year old male with no known significant PMH presented to ER for acute onset bilateral shoulder pain followed by quadriparesis.  Per patient and wife, Friday early morning at 3:30 AM, patient complaining of bilateral shoulder pain, between shoulder blade pain.  In the morning, he got up, walked to the kitchen making coffee, he was able to walk with no obvious difficulty.  After making coffee, he felt whole-body weakness, bilateral shoulder pain spreading to left chest.  She went back to bed, and wife called 911 about 15 to 20 minutes later.  On EMS arrival, patient tried to get up to go to ambulance, he felt significant lower extremity weakness, not able to hold up, he fell. He has bilateral upper extremity also in flexion position, feeling weak. The symptoms develop from normal baseline to quadriplegia in about 30 minutes.    INTERVAL HISTORY No acute events. Wife and daughter are at bedside.  Patient continues to have some improvement of bilateral upper extremity however still flaccid bilateral lower extremities.  Encourage to work with PT/OT aggressively.  Vitals:   12/10/20 2300 12/11/20 0343 12/11/20 0751 12/11/20 1132  BP: 117/65 139/71 138/75 (!) 111/54  Pulse: 62 72 68 60  Resp: 14 15 20 16   Temp: 98.4 F (36.9 C) 98.1 F (36.7 C) 97.8 F (36.6 C) 98.2 F (36.8 C)  TempSrc: Oral Oral Oral Oral  SpO2: 95% 96% 95% 94%  Weight:      Height:       CBC:  Recent Labs  Lab 12/07/20 1207 12/08/20 1105 12/09/20 0948 12/10/20 0209 12/11/20 0153  WBC 8.8   < > 9.9 9.4 9.7  NEUTROABS 7.7  --  8.2*  --   --   HGB 15.2   < > 12.3* 11.6* 11.7*  HCT 43.1   < > 34.8* 33.1* 34.5*  MCV 90.4   < > 91.8 91.7 93.0  PLT 103*   < > 75* 76* 80*   < > = values in this interval not displayed.   Basic Metabolic Panel:  Recent Labs  Lab 12/10/20 0209 12/11/20 0153  NA 134* 135  K 3.8 3.8  CL 110 108  CO2 20* 21*  GLUCOSE 93 102*  BUN 20 20   CREATININE 0.93 0.94  CALCIUM 8.1* 8.0*   Lipid Panel:  Recent Labs  Lab 12/08/20 0507  CHOL 231*  TRIG 66  HDL 38*  CHOLHDL 6.1  VLDL 13  LDLCALC 02/07/21*   HgbA1c:  Recent Labs  Lab 12/08/20 0507  HGBA1C 5.5   Urine Drug Screen:  Recent Labs  Lab 12/07/20 2210  LABOPIA NONE DETECTED  COCAINSCRNUR NONE DETECTED  LABBENZ NONE DETECTED  AMPHETMU NONE DETECTED  THCU NONE DETECTED  LABBARB NONE DETECTED    Alcohol Level  Recent Labs  Lab 12/07/20 1136  ETH <10    IMAGING past 24 hours No results found.  PHYSICAL EXAM Constitutional: Appears well-developed and well-nourished. Psych: Affect appropriate to situation MSK: no joint deformities. Cardiovascular: Normal rate and regular rhythm. Respiratory: Effort normal, non-labored breathing Skin: Warm, dry and intact    Neuro: awake, alert, orientated to age and place and people and time.  No aphasia, follows simple commands, able to name and repeat.  Mild dysarthria.  Visual fold full, cranial nerves intact. Bilateral deltoid 3/5, wrist extension and flexion 3/5, still has difficulty moving fingers, bicep 3+/5 bilaterally and tricep 2/5 on the right but 0/5 on the left,  bilateral bicep increased muscle tone.  Bilateral lower extremity flaccid, no movement.  Reflex diminished throughout except of bilateral bicep 2/5.  Light touch sensation decreased from T4 level, however proprioception sensations preserved.   ASSESSMENT/PLAN [name] is a 75 y.o. 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 Less likely differential for spinal cord symptoms may also include GBS, myelitis, MS,  Sarcoidosis.  Code Stroke CT head No acute abnormality.  MRI b/l small frontal infarct. Mild chronic small vessel ischemic disease. MR Cervical Spine Abnormal T2 hyperintensity involving the central and ventral aspects of the spinal cord from C5 into the included upper thoracic spine. Considerations include cord infarct, transverse myelitis, viral myelitis, and other inflammatory processes such as sarcoidosis. No enhancement or masslike finding to suggest intramedullary cord tumor  MR  Thoracic Spine Unchanged long segment central cervical spinal cord signal abnormality, which terminates at the T1-2 level.  CT CAP - No primary malignancy identified in the chest, abdomen or pelvis. CTA Head and Neck Minimal atherosclerosis in the head and neck without large vessel occlusion or significant proximal stenosis.  Carotid Doppler unremarkable Bilat LE duplex no DVT 2D Echo: EF 60-65% Recommend loop recorder to rule out cardioembolic source. EP notified LDL 180 HgbA1c 5.5 VTE prophylaxis - lovenox  Currently on ASA 81mg  and plavix 75 daily. Continue DAPT for 3 weeks and then ASA alone.  Therapy recommendations:  CIR Disposition:  TBD  T1 spine lesion - ? Pathological fracture vs. ? Hx of fall MR Cervical Spine - Lesion involving the T1 vertebral body and right-sided posterior elements with associated pathologic vertebral fracture concerning for metastatic disease or other neoplasm. 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 MRI thoracic spine - Unchanged appearance of possible pathologic fracture at T1. CT CAP - Lucent lesion of T1 height and small superior endplate defect,  nonspecific. CTA Head and Neck - Lucency in the T1 vertebral body with posterior cortical erosion and superior endplate fracture as seen on MRI. This remains more concerning for neoplasm (metastatic disease, lymphoma, myeloma) as opposed to a benign bone infarct. Oncology consulted  Thrombocytopenia Platelet count  81->75->78->75->80 Continue to monitor Oncology/hematology on board  Hyperlipidemia Home meds:  None  LDL 180, goal < 70 High intensity statin: Atorvastatin 80mg   Continue statin at discharge  Other Stroke Risk Factors Advanced Age >/= 15   Other Active Problems Enlarged prostate   Hospital day # 3  Neurology will sign off. Please call with questions. Pt will follow up with stroke clinic Dr. at [facility] in about 4 weeks. Thanks for the consult.  Pearlean Brownie, MD PhD Stroke Neurology 12/11/2020 4:48 PM   To contact Stroke Continuity provider, please refer to [facility] Plan. After hours, contact General Neurology

## 2020-12-11 NOTE — Consult Note (Signed)
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  Referring Physician: Dr. Rito EhrlichKrishnan   HPI: [name] is a 75 y.o. Rh-male in relatively good health who was admitted on 12/07/20 with onset of progressive diffuse weakness, pain across neck/shoulders and inability to walk. UDS negative. MRI brain done revealing acute left perirolandic infarct with mild small vessel diease. 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 History of gastroenteritis last fall when in [place].   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 Dr. Roda ShuttersXu felt that stroke to brain/spine likely cardioembolic in nature and recommended DAPT as well as loop recorder to rule out A fib. Carotid dopplers negative for ICA stenosis. BLE dopplers negative for DVT. 2D echo showed EF 60-65% with no wall abnormality. Dr. Bertis RuddyGorsuch consulted for input on pancytopenia and question metastatic prostate cancer due to work up revealing enlarged prostate and elevated PSA. Bone scan with bone bx recommended for work up of pancytopenia. Family not interested in aggressive cancer workup if patient does not have the ability to gain prior level of independence. He is showing some improvement in use of BUE and foley placed due  to urinary retention. Therapy ongoing working on tolerance of sitting in TIS, manage of orthostatic symptoms as well as self feeding with use of AE.    Review of Systems  Constitutional:  Negative for chills and fever.  HENT:  Negative for hearing loss and tinnitus.   Respiratory:  Negative for sputum production and shortness of breath.   Cardiovascular:  Negative for chest pain and palpitations.  Musculoskeletal:  Positive for myalgias (pain worse in back of neck and shoulders.). Negative for back pain, joint pain and neck pain.  Neurological:  Positive for dizziness (new--with activity), sensory change, focal weakness and weakness. Negative for headaches.  Psychiatric/Behavioral:  Positive for depression. The patient is nervous/anxious.   All other systems reviewed and are negative.   History reviewed. No pertinent past medical history.   Past Surgical History:  Procedure Laterality Date   TONSILLECTOMY      Family History  Problem Relation Age of Onset   Dementia Mother    Heart attack Father     Social History: Married. Wife in good health. He reports that he has never smoked. He has never used smokeless tobacco. He reports previous alcohol use--rare on holidays.  He denies any prior history of drug use.   Allergies: No Known Allergies   Medications Prior to Admission  Medication Sig Dispense Refill   acetaminophen (TYLENOL) 500 MG tablet Take 1,000 mg by mouth every 6 (six) hours as needed (pain).     Ascorbic Acid (VITAMIN C PO) Take 1 tablet by mouth daily.     Cholecalciferol (VITAMIN D3 PO) Take 1 tablet by mouth daily.     Cyanocobalamin (VITAMIN B-12 PO) Take 1 drop by mouth daily.  Multiple Vitamin (MULTIVITAMIN WITH MINERALS) TABS tablet Take 1 tablet by mouth daily.      Home: Home Living Family/patient expects to be discharged to:: Private residence Living Arrangements: Alone, Spouse/significant other Available Help at Discharge: Family, Available 24  hours/day Additional Comments: Pt reports he lives with his wife.  Functional History: Prior Function Level of Independence: Independent Comments: Pt reports he was fully independent PTA Functional Status:  Mobility: Bed Mobility Overal bed mobility: Needs Assistance Bed Mobility: Rolling, Sidelying to Sit, Sit to Sidelying Rolling: +2 for physical assistance, Total assist Sidelying to sit: Total assist, +2 for physical assistance Sit to sidelying: Total assist, +2 for physical assistance General bed mobility comments: totalAx2 for good technique and appropriate form, also to maintain back precautions Transfers General transfer comment: deferred- safety Ambulation/Gait General Gait Details: unable    ADL: ADL Overall ADL's : Needs assistance/impaired Eating/Feeding: Maximal assistance, Sitting Eating/Feeding Details (indicate cue type and reason): While sitting EOB with support from physical therapy and cues as needed for balance, pt used RT hand with red adaptive foam handle on feeding utensils to work on self-feeding. Pt required assist to place food on fork as pt unabel to extend elbow sufficiently to reach plate. Pt then able to bring fork to mouth with Min As to steady RUE and pt compensating by bringing head down to hand. Pt performed this x 6-7 bites, and 2-3 sips of milk with Total assist to bring carton and straw to mouth using hand over hand to allow pt to participate in task. Pt sat for task for 10-15 min then became fatigued and asked to return to supine. Pt was initially Min As to maintain EOB sittng, but with fatigue pt gradually declined to need of Max As with posterior and RT sided leaning. Functional mobility during ADLs: Total assistance, +2 for physical assistance, Cueing for sequencing General ADL Comments: Pt is dependent with all aspects of ADLs  Cognition: Cognition Overall Cognitive Status: Within Functional Limits for tasks assessed Orientation Level: Oriented  to person, Oriented to place, Oriented to situation Cognition Arousal/Alertness: Awake/alert Behavior During Therapy: WFL for tasks assessed/performed, Flat affect Overall Cognitive Status: Within Functional Limits for tasks assessed General Comments: polite and cooperative with therapy, but very flat. Might benefit from chaplain to assist with coping with brand new normal. Pt expressed that he prefers to be called "[name]".   Blood pressure 138/75, pulse 68, temperature 97.8 F (36.6 C), temperature source Oral, resp. rate 20, height 5' 8.5" (1.74 m), weight 72.6 kg, SpO2 95 %. Physical Exam Neurological:     Comments: Decreased hearing. Left facial weakness. Tight traps and pain with attempts at neck flexion. BLE flaccid. Sensory loss from nipples down.    Pt has MAS of 1+ in L elbow/shoulder, 1 in L wrist/hand MAS of 1 in RUE elbow and shoulder No movement seen other than biceps B/L, which appears to be impaired.  A little less flaccidity than I would expect in LE's.   Results for orders placed or performed during the hospital encounter of 12/07/20 (from the past 24 hour(s))  CBC     Status: Abnormal   Collection Time: 12/11/20  1:53 AM  Result Value Ref Range   WBC 9.7 4.0 - 10.5 K/uL   RBC 3.71 (L) 4.22 - 5.81 MIL/uL   Hemoglobin 11.7 (L) 13.0 - 17.0 g/dL   HCT 16.1 (L) 09.6 - 04.5 %   MCV 93.0 80.0 - 100.0 fL   MCH 31.5 26.0 -  34.0 pg   MCHC 33.9 30.0 - 36.0 g/dL   RDW 98.3 38.2 - 50.5 %   Platelets 80 (L) 150 - 400 K/uL   nRBC 0.0 0.0 - 0.2 %  Comprehensive metabolic panel     Status: Abnormal   Collection Time: 12/11/20  1:53 AM  Result Value Ref Range   Sodium 135 135 - 145 mmol/L   Potassium 3.8 3.5 - 5.1 mmol/L   Chloride 108 98 - 111 mmol/L   CO2 21 (L) 22 - 32 mmol/L   Glucose, Bld 102 (H) 70 - 99 mg/dL   BUN 20 8 - 23 mg/dL   Creatinine, Ser 3.97 0.61 - 1.24 mg/dL   Calcium 8.0 (L) 8.9 - 10.3 mg/dL   Total Protein 4.6 (L) 6.5 - 8.1 g/dL   Albumin 2.5 (L) 3.5 -  5.0 g/dL   AST 28 15 - 41 U/L   ALT 19 0 - 44 U/L   Alkaline Phosphatase 67 38 - 126 U/L   Total Bilirubin 1.5 (H) 0.3 - 1.2 mg/dL   GFR, Estimated >67 >34 mL/min   Anion gap 6 5 - 15   ECHOCARDIOGRAM COMPLETE  Result Date: 12/09/2020    ECHOCARDIOGRAM REPORT   Patient Name:   [name] Date of Exam: 12/09/2020 Medical Rec #:  [id number]    Height:       68.5 in Accession #:    [id number]   Weight:       160.0 lb Date of Birth:  December 01, 1945    BSA:          1.869 m Patient Age:    75 years     BP:           169/91 mmHg Patient Gender: M            HR:           57 bpm. Exam Location:  Inpatient Procedure: 2D Echo, Cardiac Doppler and Color Doppler Indications:    CVA  History:        Patient has no prior history of Echocardiogram examinations.  Sonographer:    Neomia Dear RDCS Referring Phys: [id number] Cecille Po MELVIN IMPRESSIONS  1. Left ventricular ejection fraction, by estimation, is 60 to 65%. The left ventricle has normal function. The left ventricle has no regional wall motion abnormalities. Left ventricular diastolic parameters were normal.  2. Right ventricular systolic function is normal. The right ventricular size is normal. There is normal pulmonary artery systolic pressure. The estimated right ventricular systolic pressure is 24.3 mmHg.  3. The mitral valve is normal in structure. No evidence of mitral valve regurgitation. No evidence of mitral stenosis.  4. The aortic valve is normal in structure. Aortic valve regurgitation is trivial. No aortic stenosis is present.  5. The inferior vena cava is normal in size with greater than 50% respiratory variability, suggesting right atrial pressure of 3 mmHg. Conclusion(s)/Recommendation(s): No evidence of valvular vegetations on this transthoracic echocardiogram. Consider a transesophageal echocardiogram to exclude infective endocarditis if clinically indicated. FINDINGS  Left Ventricle: Left ventricular ejection fraction, by estimation, is 60 to 65%.  The left ventricle has normal function. The left ventricle has no regional wall motion abnormalities. The left ventricular internal cavity size was normal in size. There is  no left ventricular hypertrophy. Left ventricular diastolic parameters were normal. Right Ventricle: The right ventricular size is normal. No increase in right ventricular wall thickness. Right ventricular systolic function is normal. There is normal pulmonary  artery systolic pressure. The tricuspid regurgitant velocity is 2.31 m/s, and  with an assumed right atrial pressure of 3 mmHg, the estimated right ventricular systolic pressure is 24.3 mmHg. Left Atrium: Left atrial size was normal in size. Right Atrium: Right atrial size was normal in size. Pericardium: There is no evidence of pericardial effusion. Mitral Valve: The mitral valve is normal in structure. No evidence of mitral valve regurgitation. No evidence of mitral valve stenosis. MV peak gradient, 3.6 mmHg. The mean mitral valve gradient is 1.0 mmHg. Tricuspid Valve: The tricuspid valve is normal in structure. Tricuspid valve regurgitation is trivial. No evidence of tricuspid stenosis. Aortic Valve: The aortic valve is normal in structure. Aortic valve regurgitation is trivial. No aortic stenosis is present. Aortic valve mean gradient measures 3.0 mmHg. Aortic valve peak gradient measures 6.7 mmHg. Aortic valve area, by VTI measures 3.90 cm. Pulmonic Valve: The pulmonic valve was not well visualized. Pulmonic valve regurgitation is trivial. No evidence of pulmonic stenosis. Aorta: The aortic root is normal in size and structure. Venous: The inferior vena cava is normal in size with greater than 50% respiratory variability, suggesting right atrial pressure of 3 mmHg. IAS/Shunts: The atrial septum is grossly normal.  LEFT VENTRICLE PLAX 2D LVIDd:         4.70 cm  Diastology LVIDs:         3.50 cm  LV e' medial:    7.40 cm/s LV PW:         0.80 cm  LV E/e' medial:  9.3 LV IVS:         1.00 cm  LV e' lateral:   11.20 cm/s LVOT diam:     2.30 cm  LV E/e' lateral: 6.2 LV SV:         106 LV SV Index:   57 LVOT Area:     4.15 cm  RIGHT VENTRICLE RV Basal diam:  4.10 cm RV Mid diam:    2.60 cm RV S prime:     17.60 cm/s TAPSE (M-mode): 2.8 cm LEFT ATRIUM             Index       RIGHT ATRIUM           Index LA Vol (A2C):   53.9 ml 28.84 ml/m RA Area:     13.30 cm LA Vol (A4C):   44.9 ml 24.02 ml/m RA Volume:   29.30 ml  15.68 ml/m LA Biplane Vol: 51.5 ml 27.56 ml/m  AORTIC VALVE                   PULMONIC VALVE AV Area (Vmax):    3.90 cm    PV Vmax:       0.97 m/s AV Area (Vmean):   3.84 cm    PV Vmean:      65.200 cm/s AV Area (VTI):     3.90 cm    PV VTI:        0.208 m AV Vmax:           129.00 cm/s PV Peak grad:  3.7 mmHg AV Vmean:          83.400 cm/s PV Mean grad:  2.0 mmHg AV VTI:            0.272 m AV Peak Grad:      6.7 mmHg AV Mean Grad:      3.0 mmHg LVOT Vmax:         121.00 cm/s  LVOT Vmean:        77.100 cm/s LVOT VTI:          0.255 m LVOT/AV VTI ratio: 0.94 AR Vena Contracta: 0.20 cm  AORTA Ao Root diam: 3.15 cm Ao Asc diam:  3.20 cm MITRAL VALVE               TRICUSPID VALVE MV Area (PHT): 3.30 cm    TR Peak grad:   21.3 mmHg MV Area VTI:   3.69 cm    TR Vmax:        231.00 cm/s MV Peak grad:  3.6 mmHg MV Mean grad:  1.0 mmHg    SHUNTS MV Vmax:       0.95 m/s    Systemic VTI:  0.26 m MV Vmean:      48.3 cm/s   Systemic Diam: 2.30 cm MV Decel Time: 230 msec MV E velocity: 69.00 cm/s MV A velocity: 63.40 cm/s MV E/A ratio:  1.09 Weston Brass MD Electronically signed by Weston Brass MD Signature Date/Time: 12/09/2020/1:04:12 PM    Final      Assessment/Plan: Diagnosis: nontraumatic quadriplegia Does the need for close, 24 hr/day medical supervision in concert with the patient's rehab needs make it unreasonable for this patient to be served in a less intensive setting? Yes Co-Morbidities requiring supervision/potential complications: neurogenic bowel and bladder, foley,  new quadriplegia; developing spasticity-early.  Due to bladder management, bowel management, safety, skin/wound care, disease management, medication administration, pain management, and patient education, does the patient require 24 hr/day rehab nursing? Yes Does the patient require coordinated care of a physician, rehab nurse, therapy disciplines of PT,OT and SLP to address physical and functional deficits in the context of the above medical diagnosis(es)? Yes Addressing deficits in the following areas: balance, endurance, strength, transferring, bowel/bladder control, bathing, dressing, feeding, grooming, and toileting Can the patient actively participate in an intensive therapy program of at least 3 hrs of therapy per day at least 5 days per week? Yes The potential for patient to make measurable gains while on inpatient rehab is good and fair Anticipated functional outcomes upon discharge from inpatient rehab are min assist and mod assist  with PT, min assist and mod assist with OT, n/a with SLP. Estimated rehab length of stay to reach the above functional goals is: 3-4 weeks Anticipated discharge destination: Home Overall Rehab/Functional Prognosis: good and fair  RECOMMENDATIONS: This patient's condition is appropriate for continued rehabilitative care in the following setting: CIR Patient has agreed to participate in recommended program. Yes Note that insurance prior authorization may be required for reimbursement for recommended care.  Comment:  I suggest putting pt on a senokot daily no matter what and then suggest getting KUB- and seeing if full of stool- is very common in new SCI patients- gut is flaccid initially and cannot push stool through on it own in spinal shock-  Continue foley for now however will see if possible to remove in CIR/rehab.  Pt and family wanted to know if pt had Guillain Barre- I explained clinically is less likely due to beginnings of spasticity seen on exam, esp  in UE B/L.  Went over the idea of a SCI- nontraumatic and the goal is to keep realistic goals but hope is also appropriate- don't know where things will go.  Also encouraged them to know he would have me inpt CIR as well as outpt after d/c.  Might require Midodrine 2.5 mg with breakfast and lunch due to orthostasis  with sitting up- better with TEDs/ACE wraps today, but still an issue.  Pt is appropriate for inpt rehab, esp  reduce care burdens/educate pt/family on care and see if there's any improvement. Cannot use Estim at this time. Due to imaging results.   Thank you for this consult- will submit for insurance approval.     Jacquelynn Cree, PA-C 12/11/2020

## 2020-12-11 NOTE — Progress Notes (Signed)
[facility]  PROGRESS NOTE  Jesus LaxManuel Pacheco ZOX:[id number] DOB: [birth year] DOA: 12/07/2020 PCP: Pcp, No   Brief HPI:   75 year old male with no past medical history presented with weakness  MRI showed acute stroke.  He was seen by neurology.  MRI of C-spine suggested metastatic disease.  Patient was hospitalized for further management.    Subjective   Patient seen and examined, moving upper extremities better than yesterday.  Not moving lower extremities.   Assessment/Plan:     Stroke -Presented with significant weakness of bilateral upper and lower extremities -Concern for spinal stroke -Carotid duplex was negative -Venous duplex of lower extremities was negative for DVT -Neurology following, started on dual antiplatelet therapy -Plan to go to CIR  Abnormal MRI cervical findings -Concern for metastatic lesion/enlarged prostate -CT scan abdomen/pelvis showed prostate gland to be enlarged -PSA was elevated at 67.79 -Oncology was consulted, they recommended bone scan -Bone scan has been ordered today -We will follow the results.  Thrombocytopenia -Patient had low platelet count; 81,000 -DIC panel showed elevated D-dimer, low fibrinogen along with schistocytes -?  DIC, hematology/oncology consulted -Okay to start DAPT per oncology  Urinary retention -Multiple and out catheterizations in the hospital -Secondary to spinal cord lesion as above -Foley catheter placed  Hyperbilirubinemia -Improved, today total bilirubin is 1.5 -Indirect bilirubin was 1.8 -Could be from mild hemolysis;  -peripheral blood smear showed schistocytes    Scheduled medications:    aspirin EC  81 mg Oral Daily   atorvastatin  80 mg Oral Daily   Chlorhexidine Gluconate Cloth  6 each Topical Daily   clopidogrel  75 mg Oral Daily         Data Reviewed:   CBG:  Recent Labs  Lab 12/07/20 1151  GLUCAP 111*    SpO2: 94 %    Vitals:   12/10/20 2300 12/11/20 0343 12/11/20 0751  12/11/20 1132  BP: 117/65 139/71 138/75 (!) 111/54  Pulse: 62 72 68 60  Resp: 14 15 20 16   Temp: 98.4 F (36.9 C) 98.1 F (36.7 C) 97.8 F (36.6 C) 98.2 F (36.8 C)  TempSrc: Oral Oral Oral Oral  SpO2: 95% 96% 95% 94%  Weight:      Height:         Intake/Output Summary (Last 24 hours) at 12/11/2020 1410 Last data filed at 12/11/2020 1000 Gross per 24 hour  Intake 120 ml  Output --  Net 120 ml    08/07 1901 - 08/09 0700 In: 120 [P.O.:120] Out: 600 [Urine:600]  Filed Weights   12/08/20 0907  Weight: 72.6 kg    CBC:  Recent Labs  Lab 12/07/20 1207 12/08/20 1105 12/08/20 1411 12/09/20 0113 12/09/20 0948 12/10/20 0209 12/11/20 0153  WBC 8.8 8.3  --  8.8 9.9 9.4 9.7  HGB 15.2 13.5  --  11.9* 12.3* 11.6* 11.7*  HCT 43.1 38.6*  --  34.7* 34.8* 33.1* 34.5*  PLT 103* 81* 75* 78* 75* 76* 80*  MCV 90.4 92.3  --  91.6 91.8 91.7 93.0  MCH 31.9 32.3  --  31.4 32.5 32.1 31.5  MCHC 35.3 35.0  --  34.3 35.3 35.0 33.9  RDW 12.6 13.2  --  13.2 13.2 13.0 13.2  LYMPHSABS 0.3*  --   --   --  0.4*  --   --   MONOABS 0.6  --   --   --  1.2*  --   --   EOSABS 0.0  --   --   --  0.0  --   --   BASOSABS 0.0  --   --   --  0.0  --   --     Complete metabolic panel:  Recent Labs  Lab 12/07/20 1207 12/08/20 0507 12/08/20 1105 12/08/20 1411 12/09/20 0113 12/09/20 0948 12/10/20 0209 12/11/20 0153  NA 135  --   --   --  136  --  134* 135  K 4.1  --   --   --  4.1  --  3.8 3.8  CL 102  --   --   --  111  --  110 108  CO2 23  --   --   --  19*  --  20* 21*  GLUCOSE 124*  --   --   --  124*  --  93 102*  BUN 13  --   --   --  20  --  20 20  CREATININE 1.00  --   --   --  0.88  --  0.93 0.94  CALCIUM 9.6  --   --   --  8.2*  --  8.1* 8.0*  AST 30  --   --   --  40  --  35 28  ALT 13  --   --   --  20  --  19 19  ALKPHOS 53  --   --   --  70  --  72 67  BILITOT 2.9*  --   --   --  2.2* 2.2* 2.0* 1.5*  ALBUMIN 4.0  --   --   --  2.8*  --  2.6* 2.5*  DDIMER  --   --   --   >20.00*  --   --   --   --   INR 1.8*  --  1.3* 1.2 1.2  --   --   --   TSH  --   --   --   --  1.128  --   --   --   HGBA1C  --  5.5  --   --   --   --   --   --     No results for input(s): LIPASE, AMYLASE in the last 168 hours.  Recent Labs  Lab 12/07/20 1207 12/08/20 1411  DDIMER  --  >20.00*  SARSCOV2NAA NEGATIVE  --     ------------------------------------------------------------------------------------------------------------------ No results for input(s): CHOL, HDL, LDLCALC, TRIG, CHOLHDL, LDLDIRECT in the last 72 hours.   Lab Results  Component Value Date   HGBA1C 5.5 12/08/2020   ------------------------------------------------------------------------------------------------------------------ Recent Labs    12/09/20 0113  TSH 1.128   ------------------------------------------------------------------------------------------------------------------ Recent Labs    12/09/20 0113 12/09/20 0948  VITAMINB12 378  --   FOLATE 11.1  --   RETICCTPCT  --  1.5    Coagulation profile Recent Labs  Lab 12/07/20 1207 12/08/20 1105 12/08/20 1411 12/09/20 0113  INR 1.8* 1.3* 1.2 1.2   Recent Labs    12/08/20 1411  DDIMER >20.00*    Cardiac Enzymes Recent Labs  Lab 12/07/20 1207  CKTOTAL 82    ------------------------------------------------------------------------------------------------------------------ No results found for: BNP   Antibiotics: Anti-infectives (From admission, onward)    None        Radiology Reports  No results found.    DVT prophylaxis: SCDs  Code Status: DNR  Family Communication: Discussed with patient's wife on phone   Consultants: Oncology Neurology  Procedures:     Objective  Physical Examination:  General-appears in no acute distress Heart-S1-S2, regular, no murmur auscultated Lungs-clear to auscultation bilaterally, no wheezing or crackles auscultated Abdomen-soft, nontender, no  organomegaly Extremities-no edema in the lower extremities Neuro-alert, oriented x3, motor strength 4/5 in upper extremities, 0/5 in lower extremities  Status is: Inpatient  Dispo: The patient is from: Home              Anticipated d/c is to: CIR              Anticipated d/c date is: 12/12/2020              Patient currently not stable for discharge  Barrier to discharge-ongoing work-up for malignancy, spinal stroke  COVID-19 Labs  Recent Labs    12/08/20 1411  DDIMER >20.00*    Lab Results  Component Value Date   SARSCOV2NAA NEGATIVE 12/07/2020    Microbiology  Recent Results (from the past 240 hour(s))  Resp Panel by RT-PCR (Flu A&B, Covid) Nasopharyngeal Swab     Status: None   Collection Time: 12/07/20 12:07 PM   Specimen: Nasopharyngeal Swab; Nasopharyngeal(NP) swabs in vial transport medium  Result Value Ref Range Status   SARS Coronavirus 2 by RT PCR NEGATIVE NEGATIVE Final    Comment: (NOTE) SARS-CoV-2 target nucleic acids are NOT DETECTED.  The SARS-CoV-2 RNA is generally detectable in upper respiratory specimens during the acute phase of infection. The lowest concentration of SARS-CoV-2 viral copies this assay can detect is 138 copies/mL. A negative result does not preclude SARS-Cov-2 infection and should not be used as the sole basis for treatment or other patient management decisions. A negative result may occur with  improper specimen collection/handling, submission of specimen other than nasopharyngeal swab, presence of viral mutation(s) within the areas targeted by this assay, and inadequate number of viral copies(<138 copies/mL). A negative result must be combined with clinical observations, patient history, and epidemiological information. The expected result is Negative.  Fact Sheet for Patients:  BloggerCourse.com  Fact Sheet for Healthcare Providers:  SeriousBroker.it  This test is no t yet  approved or cleared by the [place] FDA and  has been authorized for detection and/or diagnosis of SARS-CoV-2 by FDA under an Emergency Use Authorization (EUA). This EUA will remain  in effect (meaning this test can be used) for the duration of the COVID-19 declaration under Section 564(b)(1) of the Act, 21 U.S.C.section 360bbb-3(b)(1), unless the authorization is terminated  or revoked sooner.       Influenza A by PCR NEGATIVE NEGATIVE Final   Influenza B by PCR NEGATIVE NEGATIVE Final    Comment: (NOTE) The Xpert Xpress SARS-CoV-2/FLU/RSV plus assay is intended as an aid in the diagnosis of influenza from Nasopharyngeal swab specimens and should not be used as a sole basis for treatment. Nasal washings and aspirates are unacceptable for Xpert Xpress SARS-CoV-2/FLU/RSV testing.  Fact Sheet for Patients: BloggerCourse.com  Fact Sheet for Healthcare Providers: SeriousBroker.it  This test is not yet approved or cleared by the [place] FDA and has been authorized for detection and/or diagnosis of SARS-CoV-2 by FDA under an Emergency Use Authorization (EUA). This EUA will remain in effect (meaning this test can be used) for the duration of the COVID-19 declaration under Section 564(b)(1) of the Act, 21 U.S.C. section 360bbb-3(b)(1), unless the authorization is terminated or revoked.  Performed at [facility], [address], [place], [place] [address]              Sarina Ill  Marjorie Deprey   [facility] If 7PM-7AM, please contact night-coverage at [email or web address], Office  [phone]   12/11/2020, 2:10 PM  LOS: 3 days

## 2020-12-11 NOTE — Progress Notes (Signed)
Inpatient Rehabilitation Admissions Coordinator   I met at bedside with patient , wife and daughter. We discussed goals and expectations of a CIR admit. They prefer Cir and would like to pursue admit. I will discuss with Dr Dagoberto Ligas timing of admit this week. I will notify Cardiology of when planned admit day is for LOOP placement once determined.  Danne Baxter, RN, MSN Rehab Admissions Coordinator [phone] 12/11/2020 3:56 PM

## 2020-12-11 NOTE — Progress Notes (Signed)
Physical Therapy Treatment Patient Details Name: Derk Doubek MRN: [id number] DOB: [birth year] Today's Date: 12/11/2020    History of Present Illness 75 yo male with onset of gradually increasing weakness was brought to ED.  Pt has history of being able to do yardwork the day before arrival but then global weakness occurred.  Pt reports tingling pain on R posterior neck and upper thoracic spine.  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   Differential dx now is between myelitis and spinal stroke.  PMHx:  atherosclerosis, spinal stenosis C7-T1,    PT Comments    Patient received in bed, spouse and daughter present and very supportive/encouraging for therapy. Did need to roll side to side for management of incontinent BM prior to mobility today. Also performed SCI ACE wrapping to attempt to better stabilize BP with positional changes today- which was successful! Tolerated sitting at EOB perhaps 8-10 minutes mostly with MinA but did need as much as ModA as fatigue increased. Flexor tone/synergy in BUEs makes it difficult for him to support himself with UEs even with stacks of pillows under each elbow- might do better with bolsters/something more supportive under elbows next session?   Had a harder time finding "the sweet spot" in terms of sitting balance today, but motivated to improve. Returned to bed, removed ACE wraps, and continued working on BLE stretching and PROM. Taught daughter how to perform calf stretches, knee and hip ROM, elbow stretching, and wrist ROM to tolerance today. Educated family not to worry about stretching out hands for now, as I question if (pending progress and recovery of course) he may need to make use of tenodesis grip down the road, and this can regardless be clarified by OT. Left in bed positioned to comfort with all needs met. Continue to recommend  intensive therapies in CIR setting moving forward.    Follow Up Recommendations  CIR     Equipment Recommendations  Hospital bed;Wheelchair (measurements PT);Wheelchair cushion (measurements PT);3in1 (PT);Other (comment)    Recommendations for Other Services       Precautions / Restrictions Precautions Precautions: Back Precaution Booklet Issued: No Precaution Comments: pathologic thoracic fracture- back precautions for safety Restrictions Weight Bearing Restrictions: No    Mobility  Bed Mobility Overal bed mobility: Needs Assistance Bed Mobility: Rolling;Sidelying to Sit;Sit to Sidelying Rolling: +2 for physical assistance;Total assist Sidelying to sit: Total assist;+2 for physical assistance     Sit to sidelying: Total assist;+2 for physical assistance General bed mobility comments: totalAx2 for rolling side to side for mangement of BM and related pericare, also totalAx2 for management of BLEs and trunk getting to/from EOB while maintaining back precautions    Transfers                 General transfer comment: deferred- fatigue  Ambulation/Gait             General Gait Details: unable   Stairs             Wheelchair Mobility    Modified Rankin (Stroke Patients Only)       Balance Overall balance assessment: Needs assistance Sitting-balance support: Feet supported Sitting balance-Leahy Scale: Poor Sitting balance - Comments: Min-ModA to maintain balance today- just more fatigued and had a harder time finding "the sweet spot" with sitting balance       Standing balance comment: unable  to attempt                            Cognition Arousal/Alertness: Awake/alert Behavior During Therapy: WFL for tasks assessed/performed;Flat affect Overall Cognitive Status: Within Functional Limits for tasks assessed                                 General Comments: polite and cooperative with therapy, very flat still.  Motivated to participate, but more fatigued today.      Exercises      General Comments General comments (skin integrity, edema, etc.): BP WNL with SCI ACE wraps today and remained stable with position changes      Pertinent Vitals/Pain Pain Assessment: Faces Faces Pain Scale: Hurts little more Pain Location: R posterior neck and upper back, B shoulders Pain Descriptors / Indicators: Sore;Grimacing;Tightness;Stabbing Pain Intervention(s): Monitored during session;Limited activity within patient's tolerance;Repositioned    Home Living                      Prior Function            PT Goals (current goals can now be found in the care plan section) Acute Rehab PT Goals Patient Stated Goal: to be able to move; to eat wife's [place] cooking. PT Goal Formulation: With patient Time For Goal Achievement: 12/22/20 Potential to Achieve Goals: Fair Progress towards PT goals: Progressing toward goals    Frequency    Min 4X/week      PT Plan Current plan remains appropriate    Co-evaluation              AM-PAC PT "6 Clicks" Mobility   Outcome Measure  Help needed turning from your back to your side while in a flat bed without using bedrails?: Total Help needed moving from lying on your back to sitting on the side of a flat bed without using bedrails?: Total Help needed moving to and from a bed to a chair (including a wheelchair)?: Total Help needed standing up from a chair using your arms (e.g., wheelchair or bedside chair)?: Total Help needed to walk in hospital room?: Total Help needed climbing 3-5 steps with a railing? : Total 6 Click Score: 6    End of Session   Activity Tolerance: Patient tolerated treatment well Patient left: in bed;with call bell/phone within reach;with bed alarm set;with family/visitor present Nurse Communication: Mobility status PT Visit Diagnosis: Muscle weakness (generalized) (M62.81);Pain;Other abnormalities of gait and  mobility (R26.89)     Time: 7681-1572 (in room until 1435 but took some time off to account for CIR MD visit during session) PT Time Calculation (min) (ACUTE ONLY): 69 min  Charges:  $Therapeutic Activity: 38-52 mins $Neuromuscular Re-education: 8-22 mins $Self Care/Home Management: 8-22                    Windell Norfolk, DPT, PN2   Supplemental Physical Therapist [place]    Pager [phone] Acute Rehab Office [phone]

## 2020-12-11 NOTE — Progress Notes (Signed)
   12/11/20 1001  Clinical Encounter Type  Visited With Patient  Visit Type Spiritual support  Referral From Nurse  Consult/Referral To Chaplain   The chaplain responded to the consult stating that the patient practices Hindu and would appreciate spiritual support. Nurse Tech was at the bedside feeding the patient. I inquired how I could assist with his spiritual practice. Mr. Charters thanked me for coming but said he did not request a chaplain. His leader at his temple is called "Father," and he has spiritually supported him. He said he appreciated me stopping to check on him. This note was prepared by Deneen Harts, M.Div..  For questions please contact by phone [phone].

## 2020-12-11 NOTE — Care Management Important Message (Signed)
Important Message  Patient Details  Name: Dilon Lank MRN: [id number] Date of Birth: 28-Apr-1946   Medicare Important Message Given:  Yes     Halton Neas Stefan Church 12/11/2020, 3:27 PM

## 2020-12-12 LAB — CBC
HCT: 33.2 % — ABNORMAL LOW (ref 39.0–52.0)
Hemoglobin: 11.5 g/dL — ABNORMAL LOW (ref 13.0–17.0)
MCH: 31.8 pg (ref 26.0–34.0)
MCHC: 34.6 g/dL (ref 30.0–36.0)
MCV: 91.7 fL (ref 80.0–100.0)
Platelets: 90 10*3/uL — ABNORMAL LOW (ref 150–400)
RBC: 3.62 MIL/uL — ABNORMAL LOW (ref 4.22–5.81)
RDW: 13 % (ref 11.5–15.5)
WBC: 8.1 10*3/uL (ref 4.0–10.5)
nRBC: 0 % (ref 0.0–0.2)

## 2020-12-12 LAB — COPPER, SERUM: Copper: 70 ug/dL (ref 69–132)

## 2020-12-12 LAB — COMPREHENSIVE METABOLIC PANEL
ALT: 24 U/L (ref 0–44)
AST: 32 U/L (ref 15–41)
Albumin: 2.3 g/dL — ABNORMAL LOW (ref 3.5–5.0)
Alkaline Phosphatase: 70 U/L (ref 38–126)
Anion gap: 6 (ref 5–15)
BUN: 19 mg/dL (ref 8–23)
CO2: 21 mmol/L — ABNORMAL LOW (ref 22–32)
Calcium: 7.7 mg/dL — ABNORMAL LOW (ref 8.9–10.3)
Chloride: 109 mmol/L (ref 98–111)
Creatinine, Ser: 0.89 mg/dL (ref 0.61–1.24)
GFR, Estimated: >60 mL/min (ref >60)
Glucose, Bld: 108 mg/dL — ABNORMAL HIGH (ref 70–99)
Potassium: 3.9 mmol/L (ref 3.5–5.1)
Sodium: 136 mmol/L (ref 135–145)
Total Bilirubin: 1.1 mg/dL (ref 0.3–1.2)
Total Protein: 4.6 g/dL — ABNORMAL LOW (ref 6.5–8.1)

## 2020-12-12 MED ORDER — PANTOPRAZOLE SODIUM 40 MG PO TBEC
40.0000 mg | DELAYED_RELEASE_TABLET | Freq: Every day | ORAL | Status: DC
  Administered 2020-12-12 – 2020-12-13 (×2): 40 mg via ORAL
  Filled 2020-12-12 (×2): qty 1

## 2020-12-12 NOTE — Progress Notes (Addendum)
Inpatient Rehabilitation Admissions Coordinator   I await medical clearance to admit to CIR once medical workup is complete. Once medically cleared for d/c to CIR, LOOP recorder placement will be arranged prior to admit to CIR. I met briefly with patient when his daughter and son in law arrived. I will follow.  Danne Baxter, RN, MSN Rehab Admissions Coordinator [phone] 12/12/2020 12:53 PM

## 2020-12-12 NOTE — Progress Notes (Signed)
Speech Language Pathology Treatment: Dysphagia  Patient Details Name: [name] MRN: [id number] DOB: 09-Feb-1946 Today's Date: 12/12/2020 Time: 6269-4854 SLP Time Calculation (min) (ACUTE ONLY): 35 min  Assessment / Plan / Recommendation Clinical Impression  Skilled SLP included assessing po tolerance and initiating IS use for respiratory strength to maximize phonatory strength as well as cough/pulmonary clearance.  Pt denies coughing for sensing "strangling" on any intake - advising he is using extra caution.    Provided pt with oral care-brushing his teeth and reviewing importance of oral care for pulmonary health. Observed pt consuming water via straw - without indication of aspiration with any intake.  Advised NT that she could provide pt oral care - assuring he is sitting fully upright and fully alert.  Pt can orally manage swishing and expectorating with water.   IS set at level between 1350 and 1500 using set of 10 and subjective work effort with goal of 7/10 = after completion of set of 10 - pt advised work load was 8.  Reviewed indications/contraindications for IS use and to not use immediately after meals to decrease reflux risk as well as ceasing if dyspneic, dizzy, or having pain/discomfort using teach back.      Pt is very motivated and monitoring for tolerance of IS is advised.   He wqas noted to have minimal dyspnea after using "IS".  He demonstrated awareness to refrain from po when dyspneic due to episodic aspiration risk.    Recommend follow up SLP at [facility] for phonation strength/compensations including "chunking information" to compensate for pt "running out of breath" when talking.  RMT advised on CIR -with measures for expiratory and inspiratory strength conducted -  to which pt agreed.    Regarding phonation compensation, SLP showed pt item to consider for "hands free" calling from cell phone with headphone providing mic placed near mouth (few cm away).  Advised he start  with trial of hands free calling using cell phone placed close to him - to work on phonation strength.    Pt has made great progress in understanding importance of exercises for phonatory/respiratory strength and importance of aspiration precautions.    Recommend CIR follow up with SLP.  Thanks for allowing this SLP to assist with this pt's care plan.    HPI HPI: pt is a 75 yo male adm to [facility] with acute cva - suspected spinal stroke.  Pt with diffuse weakness, bilateral upper and lower extremities.  Pt is grossly weak.  Pt CXR negative.  Platelet count abnormal per MD report to pt and extra tests are being conducted with hematology consult per Attending.  SLP deferred speech/language evaluation, given pt denies cognitive changes and demonstrates intact language during today's session. His primary concerns are his motility of his limbs.  Follow up regarding pt's swallow function.      SLP Plan  Continue with current plan of care       Recommendations  Diet recommendations: Dysphagia 3 (mechanical soft);Thin liquid Liquids provided via: Straw Medication Administration: Whole meds with puree Supervision: Full supervision/cueing for compensatory strategies Compensations: Small sips/bites;Slow rate Postural Changes and/or Swallow Maneuvers: Seated upright 90 degrees;Upright 30-60 min after meal                Oral Care Recommendations: Oral care BID Follow up Recommendations: Inpatient Rehab SLP Visit Diagnosis: Dysphagia, unspecified (R13.10) Plan: Continue with current plan of care       GO  Jesus Infante, MS [facility] SLP Acute Rehab Services Office [phone] Pager [phone]   [name] 12/12/2020, 12:23 PM

## 2020-12-12 NOTE — Progress Notes (Signed)
Physical Therapy Treatment Patient Details Name: [name] MRN: [id number] DOB: 1945/12/05 Today's Date: 12/12/2020    History of Present Illness 75 yo male with onset of gradually increasing weakness was brought to ED.  Pt has history of being able to do yardwork the day before arrival but then global weakness occurred.  Pt reports tingling pain on R posterior neck and upper thoracic spine.  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   Differential dx now is between myelitis and spinal stroke.  PMHx:  atherosclerosis, spinal stenosis C7-T1,    PT Comments    Continuing work on functional mobility and activity tolerance, with a focus today on upright sitting tolerance, active and passive positioning/gentle stretching, and stability; Again used Ace wraps bil LEs with successful BPs; Started movement with ankle df stretching (indicated it felt good), passive hip and knee flex/extend bilaterally, and VERY gentle lower trunk movement (easing bil knees side to side in hooklying with special attention to tolerance); Able to tolerate longer time sitting up, bolstered bil elbows at pt's sides (with blankets stuffed in pillow cases); with notable postural muscle activation into anterior pelvic tilt and extension, in particular looking out the window and identifying types of clouds; Gentle trunk flexion/ext/R and L lateral movements/exercises with graded resistance; Overall very good participation; Continue to recommend comprehensive inpatient rehab (CIR) for post-acute therapy needs.  Seems to enjoy playing the music of Cheryll Cockayne (playing on youtube on computer in his room)  Follow Up Recommendations  CIR     Equipment Recommendations  Hospital bed;Wheelchair (measurements PT);Wheelchair cushion (measurements PT);3in1 (PT);Other (comment)    Recommendations for Other Services  Rehab consult     Precautions / Restrictions Precautions Precautions: Back Precaution Booklet Issued: No Precaution Comments: pathologic thoracic fracture- back precautions for safety Restrictions Other Position/Activity Restrictions: protection of spine    Mobility  Bed Mobility Overal bed mobility: Needs Assistance Bed Mobility: Rolling;Sidelying to Sit;Sit to Sidelying Rolling: +2 for physical assistance;Total assist Sidelying to sit: Total assist;+2 for physical assistance     Sit to sidelying: Total assist;+2 for physical assistance General bed mobility comments: totalAx2 for rolling; totalAx2 for management of BLEs and trunk getting to/from EOB while maintaining back precautions    Transfers                    Ambulation/Gait                 Stairs             Wheelchair Mobility    Modified Rankin (Stroke Patients Only) Modified Rankin (Stroke Patients Only) Pre-Morbid Rankin Score: No symptoms Modified Rankin: Severe disability     Balance Overall balance assessment: Needs assistance Sitting-balance support: Feet supported Sitting balance-Leahy Scale: Poor Sitting balance - Comments: majority of work of session sitting EOB with level of assist ranging from Max/Total A to brief moments of min/minguard assist; Tolerated 15-18 minutes of sitting EOB with work on active anterior pelvic tilt in conjunction with lower and upper spine extension; Bil elbows supported on bolsters (2-3 folded blankets tightly packed in pillow cases); no dizziness sitting EOB; opted to get up on R side of teh bed (for the view out the window)  Cognition Arousal/Alertness: Awake/alert Behavior During Therapy: WFL for tasks assessed/performed;Flat affect Overall Cognitive Status: Within Functional Limits for tasks assessed                                 General Comments: polite and cooperative with  therapy, More engaged in conversation when talking about his favorite books, Chartered loss adjuster, music. Motivated to participate      Exercises      General Comments General comments (skin integrity, edema, etc.): BP WNL with SCI ACE wraps today and remained stable with position changes      Pertinent Vitals/Pain Pain Assessment: No/denies pain Faces Pain Scale: Hurts a little bit Pain Location: R posterior neck and upper back, B shoulders Pain Descriptors / Indicators: Sore;Tingling Pain Intervention(s): Monitored during session    Home Living                      Prior Function            PT Goals (current goals can now be found in the care plan section) Acute Rehab PT Goals Patient Stated Goal: to be able to move; to eat wife's [place] cooking. PT Goal Formulation: With patient Time For Goal Achievement: 12/22/20 Potential to Achieve Goals: Fair Progress towards PT goals: Progressing toward goals    Frequency    Min 4X/week      PT Plan Current plan remains appropriate    Co-evaluation              AM-PAC PT "6 Clicks" Mobility   Outcome Measure  Help needed turning from your back to your side while in a flat bed without using bedrails?: Total Help needed moving from lying on your back to sitting on the side of a flat bed without using bedrails?: Total Help needed moving to and from a bed to a chair (including a wheelchair)?: Total Help needed standing up from a chair using your arms (e.g., wheelchair or bedside chair)?: Total Help needed to walk in hospital room?: Total Help needed climbing 3-5 steps with a railing? : Total 6 Click Score: 6    End of Session Equipment Utilized During Treatment: Other (comment) (heavy use of pillows, bolsters, and bed pads) Activity Tolerance: Patient tolerated treatment well;No increased pain Patient left: in bed;with call bell/phone within reach;Other (comment) (bed put in semi-chair position; Tammy, ST in to work with  pt) Nurse Communication: Mobility status PT Visit Diagnosis: Muscle weakness (generalized) (M62.81);Pain;Other abnormalities of gait and mobility (R26.89) Pain - Right/Left: Right Pain - part of body:  (neck and B upper back)     Time: 1019-1117 (minus 8-10 minutes due to overlap with ST) PT Time Calculation (min) (ACUTE ONLY): 58 min  Charges:  $Therapeutic Activity: 38-52 mins                     Van Clines, PT  Acute Rehabilitation Services Pager [phone] Office [phone]    Levi Aland 12/12/2020, 3:46 PM

## 2020-12-12 NOTE — Progress Notes (Signed)
Orthopedic Tech Progress Note Patient Details:  [name] October 19, 1945 [id number] Called in order to [facility] for [employer] Patient ID: [name], male   DOB: 1946-02-03, 75 y.o.   MRN: [id number]  [name] 12/12/2020, 4:09 PM

## 2020-12-12 NOTE — Progress Notes (Signed)
Occupational Therapy Treatment Patient Details Name: [name] MRN: [id number] DOB: 1946/02/02 Today's Date: 12/12/2020    History of present illness 75 yo male with onset of gradually increasing weakness was brought to ED.  Pt has history of being able to do yardwork the day before arrival but then global weakness occurred.  Pt reports tingling pain on R posterior neck and upper thoracic spine.  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 if this may be due to metastatic prostate CA - oncology recommends bx and bone scan.  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   Differential dx now is between myelitis and spinal stroke.  PMHx:  atherosclerosis, spinal stenosis C7-T1,   OT comments  Pt participated in A/AAROM/PROM bil. UEs.  He demonstrates questionable trace activation of bil. Triceps as well as finger flexors and extensors bil.  He also demonstrates less spasticity and increased strength and functional movement bil. UEs this date   Follow Up Recommendations  CIR    Equipment Recommendations  None recommended by OT    Recommendations for Other Services Rehab consult    Precautions / Restrictions Precautions Precautions: Back Precaution Booklet Issued: No Precaution Comments: pathologic thoracic fracture- back precautions for safety       Mobility Bed Mobility                    Transfers                      Balance                                           ADL either performed or assessed with clinical judgement   ADL                                               Vision       Perception     Praxis      Cognition Arousal/Alertness: Awake/alert Behavior During Therapy: WFL for tasks assessed/performed;Flat affect Overall Cognitive Status: Within Functional Limits for tasks assessed                                           Exercises Other Exercises Other Exercises: AAROM bil. shoulder flex/ext; A/AAROM elbow flexion/extension, wrist flex/ext, forearm sup/pron; PROM digits Other Exercises: worked with pt on targeting with Rt UE and instructed him to work on this on his own Other Exercises: IS x 10   Shoulder Instructions       General Comments      Pertinent Vitals/ Pain       Pain Assessment: No/denies pain  Home Living                                          Prior Functioning/Environment              Frequency  Min 2X/week  Progress Toward Goals  OT Goals(current goals can now be found in the care plan section)  Progress towards OT goals: Progressing toward goals     Plan Discharge plan remains appropriate    Co-evaluation                 AM-PAC OT "6 Clicks" Daily Activity     Outcome Measure   Help from another person eating meals?: A Lot Help from another person taking care of personal grooming?: Total Help from another person toileting, which includes using toliet, bedpan, or urinal?: Total Help from another person bathing (including washing, rinsing, drying)?: Total Help from another person to put on and taking off regular upper body clothing?: Total Help from another person to put on and taking off regular lower body clothing?: Total 6 Click Score: 7    End of Session    OT Visit Diagnosis: Muscle weakness (generalized) (M62.81)   Activity Tolerance Patient tolerated treatment well   Patient Left in bed;with call bell/phone within reach;with family/visitor present   Nurse Communication Mobility status        Time: 8413-2440 OT Time Calculation (min): 50 min  Charges: OT General Charges $OT Visit: 1 Visit OT Treatments $Neuromuscular Re-education: 38-52 mins  Eber Jones., OTR/L Acute Rehabilitation Services Pager [phone] Office [phone]    Jeani Hawking M 12/12/2020, 5:21 PM

## 2020-12-12 NOTE — Progress Notes (Signed)
PROGRESS NOTE    [name]  CHE:[id number] DOB: 1946-02-27 DOA: 12/07/2020 PCP: Pcp, No   Chief Complaint  Patient presents with   Weakness    Not new, but vast difference from yesterday to today    Brief Narrative:  75 year old male with no past medical history presented with weakness  MRI showed acute stroke.  He was seen by neurology.  MRI of C-spine suggested metastatic disease.  Patient was hospitalized for further management.  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  Abnormal T2 hypeintensity involving the central and ventral aspects of the spinal cord from C5 into the included upper thoracic spine (consider cord infarct, transverse myelitis, viral myelitis, sarcoid)  Disc and facet degeneration.  Mild to moderate spinal stenosis C7-T1. - MRI T spine with nondiagnostic attempted diffusion weighted imaging of C and T spine.  Unchanged long segment central cervical cord signal abnormality which terminates at the T1-2 level - carotid dopplers with near normal right and left carotid arteries, antegrade flow to bilateral vertebrals - bilateral LE without evidence of DVT in lower extremities bilaterally - echo with EF 60-65%, no RWMA (see report) - neurology recommending loop recorder to r/o cardioembolic source.  Recommending  DAPT x3 weeks, then ASA alone.  Planning for CIR.   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  Elevated PSA  Enlarged Prostate -Concern for malignancy -oncology c/s, appreciate recommendations -recommended bone scan which shows linear uptake within paraspinal musculature of R cervical spine, corresponding to abnormal signal intensity on comparison MRI, focal uptake anterior to thoracic inlet suggest correlation with T1 vertebral body lesion -CT scan abdomen/pelvis showed prostate gland to be enlarged -PSA was elevated at 67.79 -Will need additional w/u and follow up with oncology -will discuss with urology as well given PSA   Thrombocytopenia -Patient had low platelet count; 81,000 -DIC panel showed elevated D-dimer, low fibrinogen along with schistocytes -?  DIC, hematology/oncology consulted -Okay to start DAPT per oncology   Urinary retention -Multiple and out catheterizations in the hospital -Secondary to spinal cord lesion as above -Foley catheter placed   Hyperbilirubinemia -improving  DVT prophylaxis: SCD Code Status: DNR Family Communication: wife, daughter, son in law Disposition:   Status is: Inpatient  Remains inpatient appropriate because:Inpatient level of care appropriate due to severity of illness  Dispo:  Patient From: Home  Planned Disposition: Inpatient Rehab  Medically stable for discharge: No         Consultants:  Neurology Oncology   Procedures:  Echo  IMPRESSIONS     1. Left ventricular ejection fraction, by estimation, is 60 to 65%. The  left ventricle has normal function. The left ventricle has no regional  wall motion abnormalities. Left ventricular diastolic parameters  were  normal.   2. Right ventricular systolic function is normal. The right ventricular  size is normal.  There is normal pulmonary artery systolic pressure. The  estimated right ventricular systolic pressure is 24.3 mmHg.   3. The mitral valve is normal in structure. No evidence of mitral valve  regurgitation. No evidence of mitral stenosis.   4. The aortic valve is normal in structure. Aortic valve regurgitation is  trivial. No aortic stenosis is present.   5. The inferior vena cava is normal in size with greater than 50%  respiratory variability, suggesting right atrial pressure of 3 mmHg.   Conclusion(s)/Recommendation(s): No evidence of valvular vegetations on  this transthoracic echocardiogram. Consider Lenka Zhao transesophageal  echocardiogram to exclude infective endocarditis if clinically indicated.  LE [place] Summary:  BILATERAL:  - No evidence of deep vein thrombosis seen in the lower extremities,  bilaterally.  -No evidence of popliteal cyst, bilaterally.  Vascular US Summary:  Right Carotid: The extracranial vessels were near-normal with only minimal  wall                 thickening or plaque.   Left Carotid: The extracranial vessels were near-normal with only minimal  wall                thickening or plaque.   Vertebrals:  Bilateral vertebral arteries demonstrate antegrade flow.  Subclavians: Normal flow hemodynamics were seen in bilateral subclavian               arteries.   *See table(s) above for measurements and observations.   Antimicrobials:  Anti-infectives (From admission, onward)    None          Subjective: No new complaints  Objective: Vitals:   12/12/20 0742 12/12/20 1158 12/12/20 1325 12/12/20 1543  BP: 139/68 (!) 143/70  134/72  Pulse: (!) 59 60  62  Resp: 20 18  14   Temp: 98.6 F (37 C) 98.7 F (37.1 C) 98.2 F (36.8 C) 98.7 F (37.1 C)  TempSrc: Oral Oral  Oral  SpO2: 95% 96%  95%  Weight:      Height:        Intake/Output Summary (Last 24 hours) at 12/12/2020 1939 Last data filed at 12/12/2020 1805 Gross per 24 hour  Intake 520 ml   Output 1750 ml  Net -1230 ml   Filed Weights   12/08/20 0907  Weight: 72.6 kg    Examination:  General exam: Appears calm and comfortable  Respiratory system: Clear to auscultation. Respiratory effort normal. Cardiovascular system: S1 & S2 heard, RRR.  Gastrointestinal system: Abdomen is nondistended, soft and nontender. No organomegaly or masses felt. Normal bowel sounds heard. Central nervous system: Alert and oriented. Antigravity to upper extremities.  0/5 strength to LE's. Extremities: no lee     Data Reviewed: I have personally reviewed following labs and imaging studies  CBC: Recent Labs  Lab 12/07/20 1207 12/08/20 1105 12/09/20 0113 12/09/20 0948 12/10/20 0209 12/11/20 0153 12/12/20 0229  WBC 8.8   < > 8.8 9.9 9.4 9.7 8.1  NEUTROABS 7.7  --   --  8.2*  --   --   --   HGB 15.2   < > 11.9* 12.3* 11.6* 11.7* 11.5*  HCT 43.1   < > 34.7* 34.8* 33.1* 34.5* 33.2*  MCV 90.4   < > 91.6 91.8 91.7 93.0 91.7  PLT 103*   < > 78* 75* 76* 80* 90*   < > = values in  this interval not displayed.    Basic Metabolic Panel: Recent Labs  Lab 12/07/20 1207 12/09/20 0113 12/10/20 0209 12/11/20 0153 12/12/20 0229  NA 135 136 134* 135 136  K 4.1 4.1 3.8 3.8 3.9  CL 102 111 110 108 109  CO2 23 19* 20* 21* 21*  GLUCOSE 124* 124* 93 102* 108*  BUN 13 20 20 20 19   CREATININE 1.00 0.88 0.93 0.94 0.89  CALCIUM 9.6 8.2* 8.1* 8.0* 7.7*    GFR: Estimated Creatinine Clearance: 72.8 mL/min (by C-G formula based on SCr of 0.89 mg/dL).  Liver Function Tests: Recent Labs  Lab 12/07/20 1207 12/09/20 0113 12/09/20 0948 12/10/20 0209 12/11/20 0153 12/12/20 0229  AST 30 40  --  35 28 32  ALT 13 20  --  19 19 24   ALKPHOS 53 70  --  72 67 70  BILITOT 2.9* 2.2* 2.2* 2.0* 1.5* 1.1  PROT 6.1* 4.6*  --  4.7* 4.6* 4.6*  ALBUMIN 4.0 2.8*  --  2.6* 2.5* 2.3*    CBG: Recent Labs  Lab 12/07/20 1151  GLUCAP 111*     Recent Results (from the past 240 hour(s))  Resp Panel by  RT-PCR (Flu Destenie Ingber&B, Covid) Nasopharyngeal Swab     Status: None   Collection Time: 12/07/20 12:07 PM   Specimen: Nasopharyngeal Swab; Nasopharyngeal(NP) swabs in vial transport medium  Result Value Ref Range Status   SARS Coronavirus 2 by RT PCR NEGATIVE NEGATIVE Final    Comment: (NOTE) SARS-CoV-2 target nucleic acids are NOT DETECTED.  The SARS-CoV-2 RNA is generally detectable in upper respiratory specimens during the acute phase of infection. The lowest concentration of SARS-CoV-2 viral copies this assay can detect is 138 copies/mL. Tilda Samudio negative result does not preclude SARS-Cov-2 infection and should not be used as the sole basis for treatment or other patient management decisions. Nakayla Rorabaugh negative result may occur with  improper specimen collection/handling, submission of specimen other than nasopharyngeal swab, presence of viral mutation(s) within the areas targeted by this assay, and inadequate number of viral copies(<138 copies/mL). Bentlee Drier negative result must be combined with clinical observations, patient history, and epidemiological information. The expected result is Negative.  Fact Sheet for Patients:  BloggerCourse.comhttps://www.fda.gov/media/152166/download  Fact Sheet for Healthcare Providers:  SeriousBroker.ithttps://www.fda.gov/media/152162/download  This test is no t yet approved or cleared by the [place] FDA and  has been authorized for detection and/or diagnosis of SARS-CoV-2 by FDA under an Emergency Use Authorization (EUA). This EUA will remain  in effect (meaning this test can be used) for the duration of the COVID-19 declaration under Section 564(b)(1) of the Act, 21 U.S.C.section 360bbb-3(b)(1), unless the authorization is terminated  or revoked sooner.       Influenza Tristen Luce by PCR NEGATIVE NEGATIVE Final   Influenza B by PCR NEGATIVE NEGATIVE Final    Comment: (NOTE) The Xpert Xpress SARS-CoV-2/FLU/RSV plus assay is intended as an aid in the diagnosis of influenza from Nasopharyngeal swab  specimens and should not be used as Lincoln Kleiner sole basis for treatment. Nasal washings and aspirates are unacceptable for Xpert Xpress SARS-CoV-2/FLU/RSV testing.  Fact Sheet for Patients: BloggerCourse.comhttps://www.fda.gov/media/152166/download  Fact Sheet for Healthcare Providers: SeriousBroker.ithttps://www.fda.gov/media/152162/download  This test is not yet approved or cleared by the [place] FDA and has been authorized for detection and/or diagnosis of SARS-CoV-2 by FDA under an Emergency Use Authorization (EUA). This EUA will remain in effect (meaning this test can be used) for the duration of the COVID-19 declaration under Section 564(b)(1) of the Act,  21 U.S.C. section 360bbb-3(b)(1), unless the authorization is terminated or revoked.  Performed at [facility], [address], [place], [place] [address]          Radiology Studies: NM Bone Scan Whole Body  Result Date: 12/11/2020 CLINICAL DATA:  Bone lesions on MRI. Concern for metastatic skeletal disease. EXAM: NUCLEAR MEDICINE WHOLE BODY BONE SCAN TECHNIQUE: Whole body anterior and posterior images were obtained approximately 3 hours after intravenous injection of radiopharmaceutical. RADIOPHARMACEUTICALS:  Twenty mCi Technetium-72m MDP IV COMPARISON:  Cervical MRI 12/08/2020 FINDINGS: There is Jersi Mcmaster linear segment of abnormal radiotracer activity within the posterior RIGHT cervical paraspinal musculature which corresponds to abnormality within the paraspinal musculature on comparison MRI (image 27/21 on 12/07/20 and image 32/8 on MRI 12/08/2020). Activity at the level the thoracic inlet anteriorly may correlate the T1 lesion described on comparison MRIs. Elsewhere within the skeleton there is no foci of abnormal radiotracer activity. IMPRESSION: 1. Linear uptake within the paraspinal musculature pf RIGHT cervical spine posteriorly corresponds to abnormal signal intensity on comparison MRI. 2. Focal uptake anterior to the thoracic inlet suggest correlation  with the T1 vertebral body lesion. 3. Above findings may relate to acute trauma, infection, or neoplasm. 4. No additional evidence of metastatic skeletal disease. Electronically Signed   By: Genevive Bi M.D.   On: 12/11/2020 19:09        Scheduled Meds:  aspirin EC  81 mg Oral Daily   atorvastatin  80 mg Oral Daily   Chlorhexidine Gluconate Cloth  6 each Topical Daily   clopidogrel  75 mg Oral Daily   enoxaparin (LOVENOX) injection  40 mg Subcutaneous Q24H   pantoprazole  40 mg Oral Daily   Continuous Infusions:  sodium chloride 75 mL/hr at 12/12/20 1245     LOS: 4 days    Time spent: over 30 min     Lacretia Nicks, MD [facility]   To contact the attending provider between 7A-7P or the covering provider during after hours 7P-7A, please log into the web site [email or web address] and access using universal [place] password for that web site. If you do not have the password, please call the hospital operator.  12/12/2020, 7:39 PM

## 2020-12-12 NOTE — Plan of Care (Signed)
  Problem: Health Behavior/Discharge Planning: Goal: Ability to manage health-related needs will improve Outcome: Progressing   Problem: Clinical Measurements: Goal: Ability to maintain clinical measurements within normal limits will improve Outcome: Progressing Goal: Will remain free from infection Outcome: Progressing Goal: Diagnostic test results will improve Outcome: Progressing Goal: Respiratory complications will improve Outcome: Progressing Goal: Cardiovascular complication will be avoided Outcome: Progressing   Problem: Elimination: Goal: Will not experience complications related to bowel motility Outcome: Progressing Goal: Will not experience complications related to urinary retention Outcome: Progressing   Problem: Coping: Goal: Level of anxiety will decrease Outcome: Progressing   Problem: Nutrition: Goal: Adequate nutrition will be maintained Outcome: Progressing   Problem: Pain Managment: Goal: General experience of comfort will improve Outcome: Progressing

## 2020-12-13 ENCOUNTER — Encounter (HOSPITAL_COMMUNITY): Payer: Self-pay | Admitting: Physical Medicine and Rehabilitation

## 2020-12-13 ENCOUNTER — Encounter (HOSPITAL_COMMUNITY)
Admission: EM | Disposition: A | Discharge status: Rehabilitation Facility | Payer: Self-pay | Source: Home / Self Care | Attending: Family Medicine

## 2020-12-13 ENCOUNTER — Other Ambulatory Visit: Payer: Self-pay

## 2020-12-13 ENCOUNTER — Inpatient Hospital Stay (HOSPITAL_COMMUNITY)
Admission: RE | Admit: 2020-12-13 | Discharge: 2021-01-10 | DRG: 052 | Disposition: A | Discharge status: Home Health | Payer: Medicare Other | Source: Intra-hospital | Attending: Physical Medicine and Rehabilitation | Admitting: Physical Medicine and Rehabilitation

## 2020-12-13 DIAGNOSIS — G4700 Insomnia, unspecified: Secondary | ICD-10-CM | POA: Diagnosis present

## 2020-12-13 DIAGNOSIS — N390 Urinary tract infection, site not specified: Secondary | ICD-10-CM | POA: Diagnosis present

## 2020-12-13 DIAGNOSIS — R1312 Dysphagia, oropharyngeal phase: Secondary | ICD-10-CM | POA: Diagnosis not present

## 2020-12-13 DIAGNOSIS — Z515 Encounter for palliative care: Secondary | ICD-10-CM | POA: Diagnosis not present

## 2020-12-13 DIAGNOSIS — Z8249 Family history of ischemic heart disease and other diseases of the circulatory system: Secondary | ICD-10-CM | POA: Diagnosis not present

## 2020-12-13 DIAGNOSIS — M8458XD Pathological fracture in neoplastic disease, other specified site, subsequent encounter for fracture with routine healing: Secondary | ICD-10-CM | POA: Diagnosis present

## 2020-12-13 DIAGNOSIS — R1310 Dysphagia, unspecified: Secondary | ICD-10-CM

## 2020-12-13 DIAGNOSIS — R791 Abnormal coagulation profile: Secondary | ICD-10-CM | POA: Diagnosis present

## 2020-12-13 DIAGNOSIS — N5089 Other specified disorders of the male genital organs: Secondary | ICD-10-CM | POA: Diagnosis present

## 2020-12-13 DIAGNOSIS — Z978 Presence of other specified devices: Secondary | ICD-10-CM

## 2020-12-13 DIAGNOSIS — B9620 Unspecified Escherichia coli [E. coli] as the cause of diseases classified elsewhere: Secondary | ICD-10-CM | POA: Diagnosis present

## 2020-12-13 DIAGNOSIS — Z7982 Long term (current) use of aspirin: Secondary | ICD-10-CM | POA: Diagnosis not present

## 2020-12-13 DIAGNOSIS — R0989 Other specified symptoms and signs involving the circulatory and respiratory systems: Secondary | ICD-10-CM

## 2020-12-13 DIAGNOSIS — D696 Thrombocytopenia, unspecified: Secondary | ICD-10-CM | POA: Diagnosis present

## 2020-12-13 DIAGNOSIS — Z832 Family history of diseases of the blood and blood-forming organs and certain disorders involving the immune mechanism: Secondary | ICD-10-CM | POA: Diagnosis not present

## 2020-12-13 DIAGNOSIS — N400 Enlarged prostate without lower urinary tract symptoms: Secondary | ICD-10-CM | POA: Diagnosis present

## 2020-12-13 DIAGNOSIS — K592 Neurogenic bowel, not elsewhere classified: Secondary | ICD-10-CM | POA: Diagnosis present

## 2020-12-13 DIAGNOSIS — G904 Autonomic dysreflexia: Secondary | ICD-10-CM | POA: Diagnosis present

## 2020-12-13 DIAGNOSIS — Z809 Family history of malignant neoplasm, unspecified: Secondary | ICD-10-CM

## 2020-12-13 DIAGNOSIS — Z79899 Other long term (current) drug therapy: Secondary | ICD-10-CM | POA: Diagnosis not present

## 2020-12-13 DIAGNOSIS — I951 Orthostatic hypotension: Secondary | ICD-10-CM | POA: Diagnosis present

## 2020-12-13 DIAGNOSIS — G8250 Quadriplegia, unspecified: Secondary | ICD-10-CM | POA: Diagnosis not present

## 2020-12-13 DIAGNOSIS — Z8673 Personal history of transient ischemic attack (TIA), and cerebral infarction without residual deficits: Secondary | ICD-10-CM | POA: Diagnosis not present

## 2020-12-13 DIAGNOSIS — D72829 Elevated white blood cell count, unspecified: Secondary | ICD-10-CM

## 2020-12-13 DIAGNOSIS — G959 Disease of spinal cord, unspecified: Secondary | ICD-10-CM | POA: Diagnosis present

## 2020-12-13 DIAGNOSIS — F32A Depression, unspecified: Secondary | ICD-10-CM | POA: Diagnosis present

## 2020-12-13 DIAGNOSIS — I639 Cerebral infarction, unspecified: Secondary | ICD-10-CM

## 2020-12-13 DIAGNOSIS — N319 Neuromuscular dysfunction of bladder, unspecified: Secondary | ICD-10-CM | POA: Diagnosis not present

## 2020-12-13 HISTORY — PX: LOOP RECORDER INSERTION: EP1214

## 2020-12-13 LAB — CBC
HCT: 33.7 % — ABNORMAL LOW (ref 39.0–52.0)
HCT: 34 % — ABNORMAL LOW (ref 39.0–52.0)
Hemoglobin: 11.8 g/dL — ABNORMAL LOW (ref 13.0–17.0)
Hemoglobin: 12 g/dL — ABNORMAL LOW (ref 13.0–17.0)
MCH: 31.8 pg (ref 26.0–34.0)
MCH: 32.2 pg (ref 26.0–34.0)
MCHC: 35 g/dL (ref 30.0–36.0)
MCHC: 35.3 g/dL (ref 30.0–36.0)
MCV: 90.8 fL (ref 80.0–100.0)
MCV: 91.2 fL (ref 80.0–100.0)
Platelets: 116 10*3/uL — ABNORMAL LOW (ref 150–400)
Platelets: 146 10*3/uL — ABNORMAL LOW (ref 150–400)
RBC: 3.71 MIL/uL — ABNORMAL LOW (ref 4.22–5.81)
RBC: 3.73 MIL/uL — ABNORMAL LOW (ref 4.22–5.81)
RDW: 12.7 % (ref 11.5–15.5)
RDW: 12.6 % (ref 11.5–15.5)
WBC: 7.4 10*3/uL (ref 4.0–10.5)
WBC: 9.7 10*3/uL (ref 4.0–10.5)
nRBC: 0 % (ref 0.0–0.2)
nRBC: 0 % (ref 0.0–0.2)

## 2020-12-13 LAB — BRAIN NATRIURETIC PEPTIDE: B Natriuretic Peptide: 128.6 pg/mL — ABNORMAL HIGH (ref 0.0–100.0)

## 2020-12-13 LAB — COMPREHENSIVE METABOLIC PANEL
ALT: 29 U/L (ref 0–44)
AST: 34 U/L (ref 15–41)
Albumin: 2.3 g/dL — ABNORMAL LOW (ref 3.5–5.0)
Alkaline Phosphatase: 72 U/L (ref 38–126)
Anion gap: 6 (ref 5–15)
BUN: 17 mg/dL (ref 8–23)
CO2: 20 mmol/L — ABNORMAL LOW (ref 22–32)
Calcium: 7.8 mg/dL — ABNORMAL LOW (ref 8.9–10.3)
Chloride: 111 mmol/L (ref 98–111)
Creatinine, Ser: 0.76 mg/dL (ref 0.61–1.24)
GFR, Estimated: >60 mL/min (ref >60)
Glucose, Bld: 102 mg/dL — ABNORMAL HIGH (ref 70–99)
Potassium: 3.7 mmol/L (ref 3.5–5.1)
Sodium: 137 mmol/L (ref 135–145)
Total Bilirubin: 1 mg/dL (ref 0.3–1.2)
Total Protein: 4.7 g/dL — ABNORMAL LOW (ref 6.5–8.1)

## 2020-12-13 LAB — CREATININE, SERUM
Creatinine, Ser: 0.84 mg/dL (ref 0.61–1.24)
GFR, Estimated: >60 mL/min (ref >60)

## 2020-12-13 SURGERY — LOOP RECORDER INSERTION

## 2020-12-13 MED ORDER — PANTOPRAZOLE SODIUM 40 MG PO TBEC: 40.0000 mg | DELAYED_RELEASE_TABLET | Freq: Every day | ORAL | 0 refills | Status: DC

## 2020-12-13 MED ORDER — ATORVASTATIN CALCIUM 80 MG PO TABS
80.0000 mg | ORAL_TABLET | Freq: Every day | ORAL | Status: DC
  Administered 2020-12-14 – 2021-01-10 (×28): 80 mg via ORAL
  Filled 2020-12-13 (×28): qty 1

## 2020-12-13 MED ORDER — ACETAMINOPHEN 325 MG PO TABS
650.0000 mg | ORAL_TABLET | ORAL | Status: DC | PRN
  Administered 2020-12-14 – 2021-01-06 (×12): 650 mg via ORAL
  Filled 2020-12-13 (×12): qty 2

## 2020-12-13 MED ORDER — SENNOSIDES-DOCUSATE SODIUM 8.6-50 MG PO TABS: 1.0000 | ORAL_TABLET | Freq: Every evening | ORAL | Status: DC | PRN

## 2020-12-13 MED ORDER — POLYVINYL ALCOHOL 1.4 % OP SOLN: 1.0000 [drp] | OPHTHALMIC | Status: DC | PRN

## 2020-12-13 MED ORDER — ACETAMINOPHEN 650 MG RE SUPP
650.0000 mg | RECTAL | Status: DC | PRN
Start: 2020-12-13 — End: 2021-01-10

## 2020-12-13 MED ORDER — MELATONIN 3 MG PO TABS
3.0000 mg | ORAL_TABLET | Freq: Every evening | ORAL | Status: DC | PRN
  Administered 2020-12-14: 3 mg via ORAL
  Filled 2020-12-13 (×3): qty 1

## 2020-12-13 MED ORDER — ENOXAPARIN SODIUM 40 MG/0.4ML IJ SOSY: 40.0000 mg | PREFILLED_SYRINGE | INTRAMUSCULAR | Status: DC

## 2020-12-13 MED ORDER — LIDOCAINE-EPINEPHRINE 1 %-1:100000 IJ SOLN
INTRAMUSCULAR | Status: DC | PRN
  Administered 2020-12-13: 30 mL

## 2020-12-13 MED ORDER — ASPIRIN 81 MG PO TBEC: 81.0000 mg | DELAYED_RELEASE_TABLET | Freq: Every day | ORAL | 3 refills | Status: DC

## 2020-12-13 MED ORDER — CITALOPRAM HYDROBROMIDE 10 MG PO TABS
10.0000 mg | ORAL_TABLET | Freq: Every day | ORAL | Status: DC
  Administered 2020-12-14 – 2020-12-29 (×16): 10 mg via ORAL
  Filled 2020-12-13 (×16): qty 1

## 2020-12-13 MED ORDER — LIDOCAINE 5 % EX PTCH
3.0000 | MEDICATED_PATCH | CUTANEOUS | Status: DC
  Administered 2020-12-13 – 2021-01-09 (×27): 3 via TRANSDERMAL
  Filled 2020-12-13 (×27): qty 3

## 2020-12-13 MED ORDER — ATORVASTATIN CALCIUM 80 MG PO TABS: 80.0000 mg | ORAL_TABLET | Freq: Every day | ORAL | 0 refills | Status: DC

## 2020-12-13 MED ORDER — ASPIRIN EC 81 MG PO TBEC
81.0000 mg | DELAYED_RELEASE_TABLET | Freq: Every day | ORAL | Status: DC
  Administered 2020-12-14 – 2021-01-10 (×28): 81 mg via ORAL
  Filled 2020-12-13 (×29): qty 1

## 2020-12-13 MED ORDER — FUROSEMIDE 20 MG PO TABS: 20.0000 mg | ORAL_TABLET | Freq: Once | ORAL | Status: DC

## 2020-12-13 MED ORDER — PANTOPRAZOLE SODIUM 40 MG PO TBEC
40.0000 mg | DELAYED_RELEASE_TABLET | Freq: Every day | ORAL | Status: DC
  Administered 2020-12-14 – 2021-01-10 (×28): 40 mg via ORAL
  Filled 2020-12-13 (×28): qty 1

## 2020-12-13 MED ORDER — CLOPIDOGREL BISULFATE 75 MG PO TABS: 75.0000 mg | ORAL_TABLET | Freq: Every day | ORAL | 0 refills | Status: DC

## 2020-12-13 MED ORDER — CLOPIDOGREL BISULFATE 75 MG PO TABS
75.0000 mg | ORAL_TABLET | Freq: Every day | ORAL | Status: DC
  Administered 2020-12-14 – 2021-01-10 (×28): 75 mg via ORAL
  Filled 2020-12-13 (×28): qty 1

## 2020-12-13 MED ORDER — ENOXAPARIN SODIUM 40 MG/0.4ML IJ SOSY
40.0000 mg | PREFILLED_SYRINGE | INTRAMUSCULAR | Status: DC
  Administered 2020-12-13 – 2021-01-09 (×28): 40 mg via SUBCUTANEOUS
  Filled 2020-12-13 (×28): qty 0.4

## 2020-12-13 MED ORDER — ACETAMINOPHEN 160 MG/5ML PO SOLN: 650.0000 mg | ORAL | Status: DC | PRN

## 2020-12-13 SURGICAL SUPPLY — 2 items
MONITOR MOBILE MNGR LINQ22 (Prosthesis & Implant Heart) ×1 IMPLANT
PACK LOOP INSERTION (CUSTOM PROCEDURE TRAY) ×2 IMPLANT

## 2020-12-13 NOTE — PMR Pre-admission (Signed)
PMR Admission Coordinator Pre-Admission Assessment  Patient: [name] is an 75 y.o., male MRN: [id number] DOB: [birth year] Height: 5' 8.5" (174 cm) Weight: 72.6 kg              Insurance Information  PRIMARY: Medicare a and b      Policy#: [id number]      Subscriber: pt Benefits:  Phone #: passport on online     Name: 8/8 Eff. Date: 10/03/2012     Deduct: $1556      Out of Pocket Max: none      Life Max: none  CIR: 100%      SNF: 20 full days Outpatient: 80%     Co-Pay: 20% Home Health: 100%      Co-Pay: none DME: 80%     Co-Pay: 20% Providers: pt choice  SECONDARY: Sharen Counter      Policy#: [id number]  Financial Counselor:       Phone#:   The "Data Collection Information Summary" for patients in Inpatient Rehabilitation Facilities with attached "Privacy Act Statement-Health Care Records" was provided and verbally reviewed with: Patient and Family  Emergency Contact Information Contact Information     Name Relation Home Work Mobile   Arceneaux,gelma Spouse   [phone]      Current Medical History  Patient Admitting Diagnosis: nontraumatic quadriplegia  History of Present Illness: 75 year old right-handed male unremarkable past medical history except he did have a history of spinal stenosis C7-T1 not requiring surgical intervention and on no prescription medications.    Presented 12/07/2020 with onset of progressive diffuse weakness pain across his neck/shoulders and inability to walk.  Urine drug screen was negative.  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  Neurology consulted Dr. 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  Dr.Xu felt that stroke to brain/spine likely cardioembolic in nature and recommended DAPT as well as loop recorder to rule out atrial fibrillation.  Carotid Dopplers negative for ICA stenosis.  Bilateral extremity Dopplers negative.  Echocardiogram with ejection fraction of 60 to 65% no wall motion abnormalities.  Dr. Bertis Ruddy of hematology service consulted for input on pancytopenia question metastatic prostate cancer due to work-up revealing enlarged prostate and elevated PSA.  Bone scan with bone biopsy recommended for work-up of pancytopenia.  Family not interested in aggressive cancer work-up if patient did not have the ability to gain prior level of independence.  He was cleared to begin Lovenox for DVT prophylaxis.  He was placed on aspirin Plavix for CVA prophylaxis x3 weeks total then aspirin alone.  Complete NIHSS TOTAL: 14 Glasgow Coma Scale Score: 15  Past Medical History  History reviewed. No pertinent past medical history.  Family History  family history includes Cancer in his mother; Clotting disorder in his father; Dementia in his mother; Heart attack in his father.  Prior Rehab/Hospitalizations:  Has the patient had prior rehab or hospitalizations prior to admission? Yes  Has the patient had major surgery during 100 days prior to  admission? Yes  Current Medications   Current Facility-Administered Medications:    [COMPLETED] sodium chloride 0.9 % bolus 1,000 mL, 1,000 mL, Intravenous, Once, Stopped at 12/07/20 1421 **AND** 0.9 %  sodium chloride infusion, , Intravenous, Continuous, Osvaldo Shipper, MD, Last Rate: 75 mL/hr at 12/12/20 1245, New Bag at 12/12/20 1245   acetaminophen (TYLENOL)  tablet 650 mg, 650 mg, Oral, Q4H PRN, 650 mg at 12/10/20 1423 **OR** acetaminophen (TYLENOL) 160 MG/5ML solution 650 mg, 650 mg, Per Tube, Q4H PRN, 650 mg at 12/13/20 0419 **OR** acetaminophen (TYLENOL) suppository 650 mg, 650 mg, Rectal, Q4H PRN, Synetta Fail, MD   aspirin EC tablet 81 mg, 81 mg, Oral, Daily, Marvel Plan, MD, 81 mg at 12/13/20 0823   atorvastatin (LIPITOR) tablet 80 mg, 80 mg, Oral, Daily, Marvel Plan, MD, 80 mg at 12/13/20 1660   Chlorhexidine Gluconate Cloth 2 % PADS 6 each, 6 each, Topical, Daily, Osvaldo Shipper, MD, 6 each at 12/12/20 0940   clopidogrel (PLAVIX) tablet 75 mg, 75 mg, Oral, Daily, Marvel Plan, MD, 75 mg at 12/13/20 0823   enoxaparin (LOVENOX) injection 40 mg, 40 mg, Subcutaneous, Q24H, Marvel Plan, MD, 40 mg at 12/12/20 1748   pantoprazole (PROTONIX) EC tablet 40 mg, 40 mg, Oral, Daily, Zigmund Daniel., MD, 40 mg at 12/13/20 6301   polyvinyl alcohol (LIQUIFILM TEARS) 1.4 % ophthalmic solution 1 drop, 1 drop, Both Eyes, PRN, Marvel Plan, MD   senna-docusate (Senokot-S) tablet 1 tablet, 1 tablet, Oral, QHS PRN, Synetta Fail, MD  Patients Current Diet:  Diet Order             DIET DYS 3 Room service appropriate? Yes; Fluid consistency: Thin  Diet effective now                   Precautions / Restrictions Precautions Precautions: Back Precaution Booklet Issued: No Precaution Comments: pathologic thoracic fracture- back precautions for safety Restrictions Weight Bearing Restrictions: No Other Position/Activity Restrictions: protection of spine   Has the patient had 2 or more falls or a fall with injury in the past year?No  Prior Activity Level Community (5-7x/wk): Independent and active  Prior Functional Level Prior Function Level of Independence: Independent Comments: Pt reports he was fully independent PTA  Self Care: Did the patient need help bathing, dressing, using the toilet or eating?  Independent  Indoor  Mobility: Did the patient need assistance with walking from room to room (with or without device)? Independent  Stairs: Did the patient need assistance with internal or external stairs (with or without device)? Independent  Functional Cognition: Did the patient need help planning regular tasks such as shopping or remembering to take medications? Independent  Home Assistive Devices / Equipment Home Assistive Devices/Equipment: None  Prior Device Use: Indicate devices/aids used by the patient prior to current illness, exacerbation or injury? None of the above  Current Functional Level Cognition  Overall Cognitive Status: Within Functional Limits for tasks assessed Orientation Level: Oriented X4 General Comments: polite and cooperative with therapy- engaged, but just more fatigued today and tells me he had a rough night/wasn't able to get much sleep. Very motivated to improve    Extremity Assessment (includes Sensation/Coordination)  Upper Extremity Assessment: LUE deficits/detail, RUE deficits/detail RUE Deficits / Details: Pt demonstrates shoulder abduction grossly 4-/5; elbow flexion 3-/5; forearm 3-5/5; wrist extension 3-/5; tricep ? 1/5, hand ? 1/5 (difficult to accurately assess, but questionable palpable contraction noted finger flexors/extensors and tricep.  PROM WFL RUE Sensation:  decreased proprioception RUE Coordination: decreased gross motor, decreased fine motor LUE Deficits / Details: Pt demonstrates shoulders grossly 4-/5; elbow flexion 3+/5; forearm 3/5; wrist extension 3+/5; tricep 1/5, hand 1/5.  PROM WFL.  pt now able to eccentrically relax bicep to use gravity to assist with elbow extension LUE Sensation: decreased proprioception LUE Coordination: decreased gross motor, decreased fine motor  Lower Extremity Assessment: Defer to PT evaluation    ADLs  Overall ADL's : Needs assistance/impaired Eating/Feeding: Maximal assistance, Sitting Eating/Feeding Details (indicate  cue type and reason): While sitting EOB with support from physical therapy and cues as needed for balance, pt used RT hand with red adaptive foam handle on feeding utensils to work on self-feeding. Pt required assist to place food on fork as pt unabel to extend elbow sufficiently to reach plate. Pt then able to bring fork to mouth with Min As to steady RUE and pt compensating by bringing head down to hand. Pt performed this x 6-7 bites, and 2-3 sips of milk with Total assist to bring carton and straw to mouth using hand over hand to allow pt to participate in task. Pt sat for task for 10-15 min then became fatigued and asked to return to supine. Pt was initially Min As to maintain EOB sittng, but with fatigue pt gradually declined to need of Max As with posterior and RT sided leaning. Functional mobility during ADLs: Total assistance, +2 for physical assistance, Cueing for sequencing General ADL Comments: Pt is dependent with all aspects of ADLs    Mobility  Overal bed mobility: Needs Assistance Bed Mobility: Rolling Rolling: +2 for physical assistance, Total assist Sidelying to sit: Total assist, +2 for physical assistance Sit to sidelying: Total assist, +2 for physical assistance General bed mobility comments: totalAx2 for rolling- unfortunatley unable to get to EOB today as every time we moved he had a burst of liquid bowel movement, had to leave him on bedpan with nursing staff present    Transfers  General transfer comment: deferred- active liquid BMs    Ambulation / Gait / Stairs / Wheelchair Mobility  Ambulation/Gait General Gait Details: unable    Posture / Balance Dynamic Sitting Balance Sitting balance - Comments: majority of work of session sitting EOB with level of assist ranging from Max/Total A to brief moments of min/minguard assist; Tolerated 15-18 minutes of sitting EOB with work on active anterior pelvic tilt in conjunction with lower and upper spine extension; Bil elbows  supported on bolsters (2-3 folded blankets tightly packed in pillow cases); no dizziness sitting EOB; opted to get up on R side of teh bed (for the view out the window) Balance Overall balance assessment: Needs assistance Sitting-balance support: Feet supported Sitting balance-Leahy Scale: Poor Sitting balance - Comments: majority of work of session sitting EOB with level of assist ranging from Max/Total A to brief moments of min/minguard assist; Tolerated 15-18 minutes of sitting EOB with work on active anterior pelvic tilt in conjunction with lower and upper spine extension; Bil elbows supported on bolsters (2-3 folded blankets tightly packed in pillow cases); no dizziness sitting EOB; opted to get up on R side of teh bed (for the view out the window) Standing balance comment: unable to attempt    Special needs/care consideration LOOP placement 8/11 Wife requests that Dr Bertis RuddyGorsuch not be his Oncology/ Hematology  MD To follow up outpatient for further workup with Hematology/oncology for definitive diagnosis   Previous Home Environment  Living Arrangements: Spouse/significant other  Lives With: Spouse  Available Help at Discharge: Family, Available 24 hours/day Type of Home: House Home Layout: One level Home Access: Level entry, Ramped entrance Bathroom Shower/Tub: Tub/shower unit, Health visitor: Handicapped height Bathroom Accessibility: Yes How Accessible: Accessible via walker, Accessible via wheelchair Home Care Services: No Additional Comments: Pt reports he lives with his wife.  Discharge Living Setting Plans for Discharge Living Setting: Patient's home, Lives with (comment) Type of Home at Discharge: House Discharge Home Layout: One level Discharge Home Access: Level entry Discharge Bathroom Shower/Tub: Tub/shower unit Discharge Bathroom Toilet: Handicapped height Discharge Bathroom Accessibility: Yes How Accessible: Accessible via walker, Accessible via  wheelchair Does the patient have any problems obtaining your medications?: No  Social/Family/Support Systems Patient Roles: Spouse, Parent Contact Information: wife, [name] Anticipated Caregiver: wife and family Anticipated Caregiver's Contact Information: see above Ability/Limitations of Caregiver: no limits Caregiver Availability: 24/7 Discharge Plan Discussed with Primary Caregiver: Yes Is Caregiver In Agreement with Plan?: Yes Does Caregiver/Family have Issues with Lodging/Transportation while Pt is in Rehab?: No  Son and daughter in law local work, daughter and her husband from [place] but may be able to assist initially  Goals Patient/Family Goal for Rehab: mod asisst with PT and OT at wheelchair level, supervision wiht SLP Expected length of stay: [facility] 4 to 5 weeks Pt/Family Agrees to Admission and willing to participate: Yes Program Orientation Provided & Reviewed with Pt/Caregiver Including Roles  & Responsibilities: Yes  Decrease burden of Care through IP rehab admission: n/a  Possible need for SNF placement upon discharge:not anticipated  Patient Condition: This patient's condition remains as documented in the consult dated 12/11/2020, in which the Rehabilitation Physician determined and documented that the patient's condition is appropriate for intensive rehabilitative care in an inpatient rehabilitation facility. Will admit to inpatient rehab today.  Preadmission Screen Completed By:  Clois Dupes, RN, 12/13/2020 1:07 PM ______________________________________________________________________   Discussed status with Dr. Berline Chough on 12/13/2020 at 1305 and received approval for admission today.  Admission Coordinator:  Clois Dupes, time 1610 Date 12/13/2020

## 2020-12-13 NOTE — Progress Notes (Signed)
Inpatient Rehabilitation Medication Review by a Pharmacist  A complete drug regimen review was completed for this patient to identify any potential clinically significant medication issues.  Clinically significant medication issues were identified:  Yes   Type of Medication Issue Identified Description of Issue Urgent (address now) Non-Urgent (address on AM team rounds) Plan Plan Accepted by Provider? (Yes / No / Pending AM Rounds)  Drug Interaction(s) (clinically significant)       Duplicate Therapy       Allergy       No Medication Administration End Date       Incorrect Dose       Additional Drug Therapy Needed       Other  The following medications were listed on pt's discharge med list from [facility] (these were home meds that were not ordered at [facility] or on admission to CIR): Multivitamin Vitamin B12 Vitamin C Vitamin D3 Non urgent Pending CIR team review on AM rounds Pending AM rounds    Name of provider notified for urgent issues identified:  N/A  For non-urgent medication issues to be resolved on team rounds tomorrow morning a [facility] Secure Chat Handoff was sent to: N/A (med review completed after 5 PM)  Time spent performing this drug regimen review (minutes):  15  Vicki Mallet, PharmD, BCPS, [facility] Clinical Pharmacist 12/13/2020 6:59 PM

## 2020-12-13 NOTE — Discharge Summary (Addendum)
Physician Discharge Summary  [name] HBZ:[id number] DOB: [birth year] DOA: 12/07/2020  PCP: Pcp, No  Admit date: 12/07/2020 Discharge date: 12/13/2020  Time spent: 40 minutes  Recommendations for Outpatient Follow-up:  Follow outpatient CBC/CMP DAPT x21 days, then aspirin alone, continue lipitor Follow up with neurology outpatient Needs additional workup with concern for metastatic cancer - consider oncology follow up - per oncology consider bone marrow biopsy as next step to work up pancytopenia - based on follow up discussions could consider discussion with IR regarding bx of T1 lesion (would defer to oncology/IR recommendations, technically difficult based on discussion with IR/oncology) Follow up with urology for elevated PSA, enlarged prostate Palliative care c/s to follow in CIR to discuss goals of care, hopefully can help clarify how aggressive he wants to be in workup of likely malignancy with his significant deficits  Follow loop recorder with cardiology Follow scrotal swelling, consider continuing lasix  Discharge Diagnoses:  Principal Problem:   Acute CVA (cerebrovascular accident) [facility]) Active Problems:   Abnormal MRI   Sinus bradycardia seen on cardiac monitor   CVA (cerebral vascular accident) [facility])   Discharge Condition: stable  Diet recommendation: heart healthy  Filed Weights   12/08/20 0907  Weight: 72.6 kg    History of present illness:  75 year old male with no past medical history presented with weakness and quadriplegia. MRI showed acute stroke.  He was seen by neurology.  MRI of C-spine suggested metastatic disease.  Patient was hospitalized for further management.    At this point neurology recommending DAPT x21 days, then aspirin alone and atorvastatin.  He's s/p loop recorder placement.  Needs outpatient neurology follow up.  For the concern for malignancy, he needs continued workup.  Will consult palliative care to help with goals of care and to help  determine how aggressive he wants to be.    Hospital Course:  Acute Bilateral Frontal Small Infarcts as well as sudden myelopathy - concerning for embolic stroke involving bilateral frontal and spinal cord - per neurology, less likely ddx for spinal cord sx include GBS, myelitis, MS sarcoid - MRI brain with 6 mm acute L perirolandic infarct - MRI c spine with lesion involving T1 vertebral body and R sided posterior elements with associated pathologic vertebral fx concerning for metastatic disease or other neoplasm.  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  Abnormal T2 hypeintensity involving the central and ventral aspects of the spinal cord from C5 into the included upper thoracic spine (consider cord infarct, transverse myelitis, viral myelitis, sarcoid)  Disc and facet degeneration.  Mild to moderate spinal stenosis C7-T1. - MRI T spine with nondiagnostic attempted diffusion weighted imaging of C and T spine.  Unchanged long segment central cervical cord signal abnormality which terminates at the T1-2 level - carotid dopplers with near normal right and left carotid arteries, antegrade flow to bilateral vertebrals - CTA head/neck with minimal atherosclerosis in head and neck.  Lucency in T1 body with posterior cortical erosis and superior endplate fx as seen on MRI.  Concerning for neoplasm. - bilateral LE without evidence of DVT in lower extremities bilaterally - echo with EF 60-65%, no RWMA (see report) - tele sinus - neurology recommending loop recorder to r/o cardioembolic source (done 1/01).  Recommending DAPT x3 weeks, then ASA alone.  Planning for CIR.   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  Elevated PSA  Enlarged Prostate -Concern for malignancy (which could lead to hypercoagulability) -oncology c/s, appreciate  recommendations -recommended bone scan which shows linear uptake within paraspinal musculature of R cervical spine, corresponding to abnormal signal intensity on comparison MRI, focal uptake anterior to thoracic inlet suggest correlation with T1 vertebral body lesion -CT scan abdomen/pelvis showed prostate gland to be enlarged -PSA was elevated at 67.79 -seen by oncology 8/7 who noted possible concern for metastatic prostate cancer to bone - he needs additional evaluation for malignancy, discussed importance with family (they were initially overwhelmed, but seem to understand importance) - will consult palliative care to follow at CIR to discuss goals of care further and consider additional workup  (Discussed with oncology, s/p bone scan as above - could consider bone marrow biopsy and additional workup per oncology going forward, but I think reasonable to discuss goals of care with palliative assistance to ensure we're operating within patients goals/wishes, especially given his significant deficits which would complicate to some extent follow up and additional workup/procedures)  -discussed with urology as well, noted could pursue PSMA scan, noted difficulty of prostate bx given his deficits and no hoyer in clinic.  Recommended considering bx of lesion noted above, though when discussed with oncology, noted likely technically difficult and not Areyanna Figeroa good site.  After discussion, will continue with plan for rehab and palliative follow up to see how aggressive he wants to be with the workup.   Thrombocytopenia -Platelets improved today, recommend continue to follow -DIC panel showed elevated D-dimer, low fibrinogen along with schistocytes -?  DIC, hematology/oncology consulted -Okay to start DAPT per oncology   Scrotal Edema  Volume Overload - elevated BNP, trial of lasix - echo with normal EF - follow in CIR  Urinary retention -Multiple and out catheterizations in the hospital -Secondary to spinal  cord lesion as above -Foley catheter placed   Hyperbilirubinemia -improving  Procedures:  8/11 PROCEDURES:   1. Implantable loop recorder implantation  8/7 echo IMPRESSIONS     1. Left ventricular ejection fraction, by estimation, is 60 to 65%. The  left ventricle has normal function. The left ventricle has no regional  wall motion abnormalities. Left ventricular diastolic parameters were  normal.   2. Right ventricular systolic function is normal. The right ventricular  size is normal. There is normal pulmonary artery systolic pressure. The  estimated right ventricular systolic pressure is 54.4 mmHg.   3. The mitral valve is normal in structure. No evidence of mitral valve  regurgitation. No evidence of mitral stenosis.   4. The aortic valve is normal in structure. Aortic valve regurgitation is  trivial. No aortic stenosis is present.   5. The inferior vena cava is normal in size with greater than 50%  respiratory variability, suggesting right atrial pressure of 3 mmHg.   Conclusion(s)/Recommendation(s): No evidence of valvular vegetations on  this transthoracic echocardiogram. Consider Laria Grimmett transesophageal  echocardiogram to exclude infective endocarditis if clinically indicated.   LE [place] Summary:  BILATERAL:  - No evidence of deep vein thrombosis seen in the lower extremities,  bilaterally.  -No evidence of popliteal cyst, bilaterally.   Carotid US Summary:  Right Carotid: The extracranial vessels were near-normal with only minimal  wall                 thickening or plaque.   Left Carotid: The extracranial vessels were near-normal with only minimal  wall  thickening or plaque.   Vertebrals:  Bilateral vertebral arteries demonstrate antegrade flow.  Subclavians: Normal flow hemodynamics were seen in bilateral subclavian               arteries.  Consultations: Oncology Neurology cardiololgy  Discharge Exam: Vitals:   12/13/20 1226 12/13/20 1640   BP: 138/71 (!) 143/84  Pulse: 66 72  Resp: 20 20  Temp: 98 F (36.7 C) 98 F (36.7 C)  SpO2: 94% 93%   No new complaints Discussed with wife/son  General: No acute distress. Cardiovascular: Heart sounds show Shatara Stanek regular rate, and rhythm.  Lungs: Clear to auscultation bilaterally  Abdomen: Soft, nontender, nondistended GU: scrotal swelling Neurological: Alert and oriented 3. R>LUE strength, antigravity.  0/5 strength to lower extremities. Skin: Warm and dry. No rashes or lesions. Extremities: No clubbing or cyanosis. No edema  Discharge Instructions   Discharge Instructions     Ambulatory referral to Neurology   Complete by: As directed    Follow up with Dr. Leonie Man at [facility] in 4-6 weeks. Too complicated for RN to follow. Thanks.   Call MD for:  difficulty breathing, headache or visual disturbances   Complete by: As directed    Call MD for:  extreme fatigue   Complete by: As directed    Call MD for:  hives   Complete by: As directed    Call MD for:  persistant dizziness or light-headedness   Complete by: As directed    Call MD for:  persistant nausea and vomiting   Complete by: As directed    Call MD for:  redness, tenderness, or signs of infection (pain, swelling, redness, odor or green/yellow discharge around incision site)   Complete by: As directed    Call MD for:  severe uncontrolled pain   Complete by: As directed    Call MD for:  temperature >100.4   Complete by: As directed    Diet - low sodium heart healthy   Complete by: As directed    Discharge instructions   Complete by: As directed    You were seen for what we think are likely embolic strokes.  You were seen by neurology.  You've been started on aspirin and plavix for 21 days, then you'll continue aspirin alone.  You've been started on lipitor for cholesterol lowering and to reduce the risk of stroke.    They've placed Harl Wiechmann loop recorder to look for abnormal heart rhythms that may place you at risk of stroke.     We're discharging you to [facility] for therapy.    You had imaging findings that were concerning for malignancy (or cancer).  There was Mancil Pfenning lesion at T1 which was concerning for "metastatic disease or other neoplasm."  You had an enlarged prostate on exam and an elevated PSA which can be concerning for malignancy.  You will need continued workup going forward for additional evaluation to work this up further.  Cancer can cause you to be hypercoagulable (increases the risk of blood clots), so this needs to be worked up further.    We're going to ask palliative care to see you while you're at inpatient rehab to discuss goals of care.  You have scrotal swelling, we'll try Adolpho Meenach dose of lasix.  They'll continue to manage at inpatient rehab.  Return for new, recurrent, or worsening symptoms.  Please ask your PCP to request records from this hospitalization so they know what was done and what the next steps will be.  Increase activity slowly   Complete by: As directed       Allergies as of 12/13/2020   No Known Allergies      Medication List     TAKE these medications    acetaminophen 500 MG tablet Commonly known as: TYLENOL Take 1,000 mg by mouth every 6 (six) hours as needed (pain).   aspirin 81 MG EC tablet Take 1 tablet (81 mg total) by mouth daily. Swallow whole. Start taking on: December 14, 2020   atorvastatin 80 MG tablet Commonly known as: LIPITOR Take 1 tablet (80 mg total) by mouth daily. Start taking on: December 14, 2020   clopidogrel 75 MG tablet Commonly known as: PLAVIX Take 1 tablet (75 mg total) by mouth daily for 17 days. Start taking on: December 14, 2020   multivitamin with minerals Tabs tablet Take 1 tablet by mouth daily.   pantoprazole 40 MG tablet Commonly known as: PROTONIX Take 1 tablet (40 mg total) by mouth daily. Start taking on: December 14, 2020   VITAMIN B-12 PO Take 1 drop by mouth daily.   VITAMIN C PO Take 1 tablet by mouth  daily.   VITAMIN D3 PO Take 1 tablet by mouth daily.       No Known Allergies  Follow-up Information     Garvin Fila, MD. Schedule an appointment as soon as possible for Telissa Palmisano visit in 1 month(s).   Specialties: Neurology, Radiology Why: stroke clinic Contact information: [place] [address] [phone]         [facility] Office Follow up.   Specialty: Cardiology Why: 12/27/20 @ 2:40PM wound check visit (heart monitor) Contact information: [address], [place] [phone]                 The results of significant diagnostics from this hospitalization (including imaging, microbiology, ancillary and laboratory) are listed below for reference.    Significant Diagnostic Studies: CT ANGIO HEAD NECK W WO CM  Result Date: 12/08/2020 CLINICAL DATA:  Stroke, follow-up. Acute quadriparesis. Small acute left perirolandic infarct on head MRI and suspected cervical and upper thoracic spinal cord infarct on spine MRI. EXAM: CT ANGIOGRAPHY HEAD AND NECK TECHNIQUE: Multidetector CT imaging of the head and neck was performed using the standard protocol during bolus administration of intravenous contrast. Multiplanar CT image reconstructions and MIPs were obtained to evaluate the vascular anatomy. Carotid stenosis measurements (when applicable) are obtained utilizing NASCET criteria, using the distal internal carotid diameter as the denominator. CONTRAST:  11m OMNIPAQUE IOHEXOL 350 MG/ML SOLN COMPARISON:  MRIs of the head, cervical spine, and thoracic spine from earlier today and yesterday FINDINGS: CT HEAD FINDINGS Brain: The subcentimeter acute left perirolandic infarct was better demonstrated on MRI. No new infarct, acute intracranial hemorrhage, mass, midline shift, or extra-axial fluid collection is identified. The ventricles and sulci are within normal limits for age. Hypodensities in the cerebral  white matter bilaterally are nonspecific but compatible with mild chronic small vessel ischemic disease. Vascular: Calcified atherosclerosis at the skull base. Skull: No fracture or suspicious osseous lesion. Sinuses: Mild mucosal thickening in the paranasal sinuses. Clear mastoid air cells. Orbits: Unremarkable. Review of the MIP images confirms the above findings CTA NECK FINDINGS Aortic arch: Normal variant aortic arch branching pattern with common origin of the brachiocephalic and left common carotid arteries and with the left vertebral artery arising directly from the arch. Widely patent arch vessel origins.  Right carotid system: Patent without evidence of stenosis, dissection, or significant atherosclerosis. Left carotid system: Patent without evidence of stenosis, dissection, or significant atherosclerosis. Vertebral arteries: Patent and codominant without evidence of stenosis, dissection, or significant atherosclerosis. Skeleton: Lucency in the T1 vertebral body with posterior cortical erosion and focal superior endplate fracture as seen on MRI. Other neck: No evidence of cervical lymphadenopathy or mass. Upper chest: Clear lung apices. Review of the MIP images confirms the above findings CTA HEAD FINDINGS Anterior circulation: The internal carotid arteries are patent from skull base to carotid termini with minimal nonstenotic plaque. ACAs and MCAs are patent without evidence of Setsuko Robins proximal branch occlusion or significant proximal stenosis. 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 No aneurysm is identified. Posterior circulation: The intracranial vertebral arteries are widely patent to the basilar. Patent left PICA, right AICA, and bilateral SCA origins are visualized. The basilar artery is widely patent. There are moderately large right and diminutive left posterior communicating arteries with hypoplasia of the right P1 segment, and there are also large anterior choroidal arteries which supply Elier Zellars portion of the PCA  territory bilaterally. No significant proximal PCA stenosis is evident. No aneurysm is identified. Venous sinuses: Patent. Anatomic variants: As above. Review of the MIP images confirms the above findings IMPRESSION: 1. Minimal atherosclerosis in the head and neck without large vessel occlusion or significant proximal stenosis. 2. Lucency in the T1 vertebral body with posterior cortical erosion and superior endplate fracture as seen on MRI. This remains more concerning for neoplasm (metastatic disease, lymphoma, myeloma) as opposed to Darnita Woodrum benign bone infarct. Electronically Signed   By: Logan Bores M.D.   On: 12/08/2020 11:24   CT HEAD WO CONTRAST  Result Date: 12/07/2020 CLINICAL DATA:  Mental status change, unknown cause. Altered mental status. EXAM: CT HEAD WITHOUT CONTRAST TECHNIQUE: Contiguous axial images were obtained from the base of the skull through the vertex without intravenous contrast. COMPARISON:  No pertinent prior exams available for comparison. FINDINGS: Brain: Cerebral volume is normal. There is no acute intracranial hemorrhage. No demarcated cortical infarct. No extra-axial fluid collection. No evidence of an intracranial mass. No midline shift. Vascular: No hyperdense vessel.  Atherosclerotic calcifications Skull: No calvarial fracture or focal suspicious osseous lesion. Hyperostosis frontalis interna, predominantly on the left. Sinuses/Orbits: Visualized orbits show no acute finding. Mild bilateral ethmoid sinus mucosal thickening. Trace mucosal thickening within the bilateral sphenoid sinuses. Small bilateral maxillary sinus mucous retention cysts. IMPRESSION: No evidence of acute intracranial abnormality. Mild paranasal sinus disease, as described. Electronically Signed   By: Kellie Simmering DO   On: 12/07/2020 13:05   MR Brain W and Wo Contrast  Result Date: 12/07/2020 CLINICAL DATA:  Neuro deficit, acute, stroke suspected.  Weakness. EXAM: MRI HEAD WITHOUT AND WITH CONTRAST TECHNIQUE:  Multiplanar, multiecho pulse sequences of the brain and surrounding structures were obtained without and with intravenous contrast. CONTRAST:  73m GADAVIST GADOBUTROL 1 MMOL/ML IV SOLN COMPARISON:  Head CTA 522 FINDINGS: Brain: There is Johnathen Testa 6 mm acute cortical/subcortical infarct in the left perirolandic region. Scattered small T2 hyperintensities in the cerebral white matter bilaterally are nonspecific but compatible with mild chronic small vessel ischemic disease. The ventricles and sulci are within normal limits for age. No intracranial hemorrhage, mass, midline shift, extra-axial fluid collection, or abnormal enhancement is identified. Vascular: Major intracranial vascular flow voids are preserved. Skull and upper cervical spine: Unremarkable bone marrow signal. Asymmetric left frontal hyperostosis interna. Sinuses/Orbits: Unremarkable orbits. Unremarkable orbits. Mild bilateral ethmoid air cell  mucosal thickening. Other: None. IMPRESSION: 1. 6 mm acute left perirolandic infarct. 2. Mild chronic small vessel ischemic disease. Electronically Signed   By: Logan Bores M.D.   On: 12/07/2020 18:14   MR CERVICAL SPINE WO CONTRAST  Result Date: 12/08/2020 CLINICAL DATA:  Possible cord infarct. EXAM: MRI CERVICAL AND THORACIC SPINE WITHOUT CONTRAST TECHNIQUE: Multiplanar and multiecho pulse sequences of the cervical spine, to include the craniocervical junction and cervicothoracic junction, and the thoracic spine, were obtained without intravenous contrast. Diffusion-weighted imaging was performed. COMPARISON:  MRI cervical spine 12/07/2020 FINDINGS: MRI CERVICAL SPINE FINDINGS Findings of the cervical spine, including the long segment central cord signal abnormality, are unchanged from the earlier cervical spine MRI. Additionally, diffusion-weighted imaging was performed however, the images are of limited diagnostic value. MRI THORACIC SPINE FINDINGS Alignment:  Physiologic. Vertebrae: Unchanged possible pathologic  fracture at T1. Cord: Central cord signal abnormality terminates at the T1-2 level. The remainder of the thoracic spinal cord is normal. Paraspinal and other soft tissues: Negative. Disc levels: No spinal canal or neural foraminal stenosis. Nondiagnostic diffusion-weighted imaging. IMPRESSION: 1. Nondiagnostic attempted diffusion-weighted imaging of the cervical and thoracic spine. Utilization of Nicolet Griffy more tailored spine MRI protocol may be helpful if this can be made available. 2. Unchanged long segment central cervical spinal cord signal abnormality, which terminates at the T1-2 level. No signal abnormality within the remainder of the thoracic spinal cord. 3. Unchanged appearance of possible pathologic fracture at T1. Electronically Signed   By: Ulyses Jarred M.D.   On: 12/08/2020 02:35   MR THORACIC SPINE WO CONTRAST  Result Date: 12/08/2020 CLINICAL DATA:  Possible cord infarct. EXAM: MRI CERVICAL AND THORACIC SPINE WITHOUT CONTRAST TECHNIQUE: Multiplanar and multiecho pulse sequences of the cervical spine, to include the craniocervical junction and cervicothoracic junction, and the thoracic spine, were obtained without intravenous contrast. Diffusion-weighted imaging was performed. COMPARISON:  MRI cervical spine 12/07/2020 FINDINGS: MRI CERVICAL SPINE FINDINGS Findings of the cervical spine, including the long segment central cord signal abnormality, are unchanged from the earlier cervical spine MRI. Additionally, diffusion-weighted imaging was performed however, the images are of limited diagnostic value. MRI THORACIC SPINE FINDINGS Alignment:  Physiologic. Vertebrae: Unchanged possible pathologic fracture at T1. Cord: Central cord signal abnormality terminates at the T1-2 level. The remainder of the thoracic spinal cord is normal. Paraspinal and other soft tissues: Negative. Disc levels: No spinal canal or neural foraminal stenosis. Nondiagnostic diffusion-weighted imaging. IMPRESSION: 1. Nondiagnostic  attempted diffusion-weighted imaging of the cervical and thoracic spine. Utilization of Drew Herman more tailored spine MRI protocol may be helpful if this can be made available. 2. Unchanged long segment central cervical spinal cord signal abnormality, which terminates at the T1-2 level. No signal abnormality within the remainder of the thoracic spinal cord. 3. Unchanged appearance of possible pathologic fracture at T1. Electronically Signed   By: Ulyses Jarred M.D.   On: 12/08/2020 02:35   MR Cervical Spine W or Wo Contrast  Result Date: 12/07/2020 CLINICAL DATA:  Demyelinating disease.  Weakness. EXAM: MRI CERVICAL SPINE WITHOUT AND WITH CONTRAST TECHNIQUE: Multiplanar and multiecho pulse sequences of the cervical spine, to include the craniocervical junction and cervicothoracic junction, were obtained without and with intravenous contrast. CONTRAST:  54m GADAVIST GADOBUTROL 1 MMOL/ML IV SOLN COMPARISON:  None. FINDINGS: The study is motion degraded including severe motion on the axial T2 gradient echo sequence. Alignment: Reversal of the normal cervical lordosis. Trace anterolisthesis of C4 on C5 and C7 on T1. Vertebrae: Abnormal marked T1 hypointensity,  STIR hyperintensity, and mild enhancement involving the marrow throughout the majority of the T1 vertebral body and extending into the right-sided posterior elements with an associated fracture involving the superior endplate and posterior vertebral body cortex resulting in focal 40% vertebral body height loss and 3 mm retropulsion of the posterior vertebral body cortex. 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 Abnormal signal hypointensity posteriorly in the C6 and C7 vertebral bodies on the sagittal T1 postcontrast sequence without Albion Weatherholtz corresponding abnormality on the other sequences to confirm an underlying lesion. Degenerative endplate changes at V4-2 and C6-7. Cord: Mild T2 hyperintensity involving the central and ventral  spinal cord bilaterally from C5 into the included upper thoracic spine without enhancement. Posterior Fossa, vertebral arteries, paraspinal tissues: Unremarkable. Disc levels: C2-3: Severe right facet arthrosis result in mild-to-moderate right neural foraminal stenosis without spinal stenosis. C3-4: Moderate facet arthrosis results in mild right neural foraminal stenosis without spinal stenosis. C4-5: Anterolisthesis with bulging uncovered disc, uncovertebral spurring, and moderate right facet arthrosis result in mild spinal stenosis and moderate right neural foraminal stenosis. C5-6: Mild-to-moderate disc space narrowing. Disc bulging and uncovertebral spurring result in moderate right and severe left neural foraminal stenosis without significant spinal stenosis. C6-7: Mild-to-moderate disc space narrowing. Disc bulging, Caci Orren small central disc protrusion, and uncovertebral spurring result in borderline spinal stenosis and severe right and moderate to severe left neural foraminal stenosis. C7-T1: Anterolisthesis with disc uncovering, infolding of the ligamentum flavum, and severe facet arthrosis result in mild-to-moderate spinal stenosis and severe bilateral neural foraminal stenosis. IMPRESSION: 1. Lesion involving the T1 vertebral body and right-sided posterior elements with associated pathologic vertebral fracture concerning for metastatic disease or other neoplasm. 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 2. Abnormal T2 hyperintensity involving the central and ventral aspects of the spinal cord from C5 into the included upper thoracic spine. Considerations include cord infarct, transverse myelitis, viral myelitis, and other inflammatory processes such as sarcoidosis. No enhancement or masslike finding to suggest intramedullary cord tumor although thoracic spine imaging is recommended for further evaluation. 3. Disc and facet degeneration resulting in severe multilevel neural foraminal stenosis as above. 4.  Mild-to-moderate spinal stenosis at C7-T1. Electronically Signed   By: Logan Bores M.D.   On: 12/07/2020 18:45   NM Bone Scan Whole Body  Result Date: 12/11/2020 CLINICAL DATA:  Bone lesions on MRI. Concern for metastatic skeletal disease. EXAM: NUCLEAR MEDICINE WHOLE BODY BONE SCAN TECHNIQUE: Whole body anterior and posterior images were obtained approximately 3 hours after intravenous injection of radiopharmaceutical. RADIOPHARMACEUTICALS:  Twenty mCi Technetium-27mMDP IV COMPARISON:  Cervical MRI 12/08/2020 FINDINGS: There is Price Lachapelle linear segment of abnormal radiotracer activity within the posterior RIGHT cervical paraspinal musculature which corresponds to abnormality within the paraspinal musculature on comparison MRI (image 27/21 on 12/07/20 and image 32/8 on MRI 12/08/2020). Activity at the level the thoracic inlet anteriorly may correlate the T1 lesion described on comparison MRIs. Elsewhere within the skeleton there is no foci of abnormal radiotracer activity. IMPRESSION: 1. Linear uptake within the paraspinal musculature pf RIGHT cervical spine posteriorly corresponds to abnormal signal intensity on comparison MRI. 2. Focal uptake anterior to the thoracic inlet suggest correlation with the T1 vertebral body lesion. 3. Above findings may relate to acute trauma, infection, or neoplasm. 4. No additional evidence of metastatic skeletal disease. Electronically Signed   By: SSuzy BouchardM.D.   On: 12/11/2020 19:09   CT CHEST ABDOMEN PELVIS W CONTRAST  Result Date: 12/08/2020  CLINICAL DATA:  Thoracic spine osseous lesion. Assessment for potential primary malignancy EXAM: CT CHEST, ABDOMEN, AND PELVIS WITH CONTRAST TECHNIQUE: Multidetector CT imaging of the chest, abdomen and pelvis was performed following the standard protocol during bolus administration of intravenous contrast. CONTRAST:  112m OMNIPAQUE IOHEXOL 300 MG/ML  SOLN COMPARISON:  None. FINDINGS: CT CHEST FINDINGS Cardiovascular: No significant  vascular findings. Normal heart size. No pericardial effusion. Mediastinum/Nodes: No enlarged mediastinal, hilar, or axillary lymph nodes. Thyroid gland, trachea, and esophagus demonstrate no significant findings. Lungs/Pleura: Lungs are clear. No pleural effusion or pneumothorax. Musculoskeletal: Lucent lesion of T1 height and small superior endplate defect. No other focal osseous lesion. CT ABDOMEN PELVIS FINDINGS Hepatobiliary: No focal liver abnormality is seen. No gallstones, gallbladder wall thickening, or biliary dilatation. Pancreas: Unremarkable. No pancreatic ductal dilatation or surrounding inflammatory changes. Spleen: Normal in size without focal abnormality. Adrenals/Urinary Tract: Normal adrenal glands. Bilateral mild perinephric stranding. Exophytic left renal cyst measuring 16 mm. Stomach/Bowel: Stomach is within normal limits. Appendix appears normal. No evidence of bowel wall thickening, distention, or inflammatory changes. Vascular/Lymphatic: Aortic atherosclerosis. No enlarged abdominal or pelvic lymph nodes. Reproductive: Enlarged prostate measuring 5.8 cm transverse dimension. Other: No abdominal wall hernia or abnormality. No abdominopelvic ascites. Musculoskeletal: No acute or significant osseous findings. IMPRESSION: 1. No primary malignancy identified in the chest, abdomen or pelvis. 2. Lucent lesion of T1 height and small superior endplate defect, nonspecific. No other focal osseous lesions. 3. Bilateral mild perinephric stranding, nonspecific. 4. Enlarged prostate measuring 5.8 cm transverse dimension. Aortic Atherosclerosis (ICD10-I70.0). Electronically Signed   By: KUlyses JarredM.D.   On: 12/08/2020 03:21   DG Chest Port 1 View  Result Date: 12/07/2020 CLINICAL DATA:  Altered level of consciousness. EXAM: PORTABLE CHEST 1 VIEW COMPARISON:  None. FINDINGS: The heart size and mediastinal contours are within normal limits. Both lungs are clear. The visualized skeletal structures are  unremarkable. IMPRESSION: No active disease. Electronically Signed   By: CFranchot GalloM.D.   On: 12/07/2020 12:19   ECHOCARDIOGRAM COMPLETE  Result Date: 12/09/2020    ECHOCARDIOGRAM REPORT   Patient Name:   Jesus FROELICHDate of Exam: 12/09/2020 Medical Rec #:  [id number]   Height:       68.5 in Accession #:    [id number]  Weight:       160.0 lb Date of Birth:  611/02/1946   BSA:          1.869 m Patient Age:    89years     BP:           169/91 mmHg Patient Gender: M            HR:           57 bpm. Exam Location:  Inpatient Procedure: 2D Echo, Cardiac Doppler and Color Doppler Indications:    CVA  History:        Patient has no prior history of Echocardiogram examinations.  Sonographer:    TWestonReferring Phys: [id number] 1. Left ventricular ejection fraction, by estimation, is 60 to 65%. The left ventricle has normal function. The left ventricle has no regional wall motion abnormalities. Left ventricular diastolic parameters were normal.  2. Right ventricular systolic function is normal. The right ventricular size is normal. There is normal pulmonary artery systolic pressure. The estimated right ventricular systolic pressure is 250.0mmHg.  3. The mitral valve is normal in structure. No evidence of mitral  valve regurgitation. No evidence of mitral stenosis.  4. The aortic valve is normal in structure. Aortic valve regurgitation is trivial. No aortic stenosis is present.  5. The inferior vena cava is normal in size with greater than 50% respiratory variability, suggesting right atrial pressure of 3 mmHg. Conclusion(s)/Recommendation(s): No evidence of valvular vegetations on this transthoracic echocardiogram. Consider Krystale Rinkenberger transesophageal echocardiogram to exclude infective endocarditis if clinically indicated. FINDINGS  Left Ventricle: Left ventricular ejection fraction, by estimation, is 60 to 65%. The left ventricle has normal function. The left ventricle has no regional  wall motion abnormalities. The left ventricular internal cavity size was normal in size. There is  no left ventricular hypertrophy. Left ventricular diastolic parameters were normal. Right Ventricle: The right ventricular size is normal. No increase in right ventricular wall thickness. Right ventricular systolic function is normal. There is normal pulmonary artery systolic pressure. The tricuspid regurgitant velocity is 2.31 m/s, and  with an assumed right atrial pressure of 3 mmHg, the estimated right ventricular systolic pressure is 67.3 mmHg. Left Atrium: Left atrial size was normal in size. Right Atrium: Right atrial size was normal in size. Pericardium: There is no evidence of pericardial effusion. Mitral Valve: The mitral valve is normal in structure. No evidence of mitral valve regurgitation. No evidence of mitral valve stenosis. MV peak gradient, 3.6 mmHg. The mean mitral valve gradient is 1.0 mmHg. Tricuspid Valve: The tricuspid valve is normal in structure. Tricuspid valve regurgitation is trivial. No evidence of tricuspid stenosis. Aortic Valve: The aortic valve is normal in structure. Aortic valve regurgitation is trivial. No aortic stenosis is present. Aortic valve mean gradient measures 3.0 mmHg. Aortic valve peak gradient measures 6.7 mmHg. Aortic valve area, by VTI measures 3.90 cm. Pulmonic Valve: The pulmonic valve was not well visualized. Pulmonic valve regurgitation is trivial. No evidence of pulmonic stenosis. Aorta: The aortic root is normal in size and structure. Venous: The inferior vena cava is normal in size with greater than 50% respiratory variability, suggesting right atrial pressure of 3 mmHg. IAS/Shunts: The atrial septum is grossly normal.  LEFT VENTRICLE PLAX 2D LVIDd:         4.70 cm  Diastology LVIDs:         3.50 cm  LV e' medial:    7.40 cm/s LV PW:         0.80 cm  LV E/e' medial:  9.3 LV IVS:        1.00 cm  LV e' lateral:   11.20 cm/s LVOT diam:     2.30 cm  LV E/e' lateral:  6.2 LV SV:         106 LV SV Index:   57 LVOT Area:     4.15 cm  RIGHT VENTRICLE RV Basal diam:  4.10 cm RV Mid diam:    2.60 cm RV S prime:     17.60 cm/s TAPSE (M-mode): 2.8 cm LEFT ATRIUM             Index       RIGHT ATRIUM           Index LA Vol (A2C):   53.9 ml 28.84 ml/m RA Area:     13.30 cm LA Vol (A4C):   44.9 ml 24.02 ml/m RA Volume:   29.30 ml  15.68 ml/m LA Biplane Vol: 51.5 ml 27.56 ml/m  AORTIC VALVE                   PULMONIC VALVE AV  Area (Vmax):    3.90 cm    PV Vmax:       0.97 m/s AV Area (Vmean):   3.84 cm    PV Vmean:      65.200 cm/s AV Area (VTI):     3.90 cm    PV VTI:        0.208 m AV Vmax:           129.00 cm/s PV Peak grad:  3.7 mmHg AV Vmean:          83.400 cm/s PV Mean grad:  2.0 mmHg AV VTI:            0.272 m AV Peak Grad:      6.7 mmHg AV Mean Grad:      3.0 mmHg LVOT Vmax:         121.00 cm/s LVOT Vmean:        77.100 cm/s LVOT VTI:          0.255 m LVOT/AV VTI ratio: 0.94 AR Vena Contracta: 0.20 cm  AORTA Ao Root diam: 3.15 cm Ao Asc diam:  3.20 cm MITRAL VALVE               TRICUSPID VALVE MV Area (PHT): 3.30 cm    TR Peak grad:   21.3 mmHg MV Area VTI:   3.69 cm    TR Vmax:        231.00 cm/s MV Peak grad:  3.6 mmHg MV Mean grad:  1.0 mmHg    SHUNTS MV Vmax:       0.95 m/s    Systemic VTI:  0.26 m MV Vmean:      48.3 cm/s   Systemic Diam: 2.30 cm MV Decel Time: 230 msec MV E velocity: 69.00 cm/s MV Aristea Posada velocity: 63.40 cm/s MV E/Azarah Dacy ratio:  1.09 Cherlynn Kaiser MD Electronically signed by Cherlynn Kaiser MD Signature Date/Time: 12/09/2020/1:04:12 PM    Final    VAS US CAROTID  Result Date: 12/08/2020 Carotid Arterial Duplex Study Patient Name:  [name]  Date of Exam:   12/08/2020 Medical Rec #: [id number]     Accession #:    [id number] Date of Birth: 19-Apr-1946     Patient Gender: M Patient Age:   79 years Exam Location:  [facility] Procedure:      VAS US CAROTID Referring Phys: Sheppard Coil MELVIN  --------------------------------------------------------------------------------  Indications:       CVA. Other Factors:     No known past medical history. Comparison Study:  No previous exams Performing Technologist: Jody Hill RVT, RDMS  Examination Guidelines: Mariafernanda Hendricksen complete evaluation includes B-mode imaging, spectral Doppler, color Doppler, and power Doppler as needed of all accessible portions of each vessel. Bilateral testing is considered an integral part of Arizbeth Cawthorn complete examination. Limited examinations for reoccurring indications may be performed as noted.  Right Carotid Findings: +----------+--------+--------+--------+------------------+------------------+           PSV cm/sEDV cm/sStenosisPlaque DescriptionComments           +----------+--------+--------+--------+------------------+------------------+ CCA Prox  107     0                                 intimal thickening +----------+--------+--------+--------+------------------+------------------+ CCA Distal94      0  intimal thickening +----------+--------+--------+--------+------------------+------------------+ ICA Prox  81      8                                                    +----------+--------+--------+--------+------------------+------------------+ ICA Distal34      8                                                    +----------+--------+--------+--------+------------------+------------------+ ECA       86      0                                                    +----------+--------+--------+--------+------------------+------------------+ +----------+--------+-------+----------------+-------------------+           PSV cm/sEDV cmsDescribe        Arm Pressure (mmHG) +----------+--------+-------+----------------+-------------------+ [id number]            Multiphasic, WNL                    +----------+--------+-------+----------------+-------------------+  +---------+--------+--+--------+-+---------+ VertebralPSV cm/s55EDV cm/s8Antegrade +---------+--------+--+--------+-+---------+  Left Carotid Findings: +----------+--------+--------+--------+------------------+------------------+           PSV cm/sEDV cm/sStenosisPlaque DescriptionComments           +----------+--------+--------+--------+------------------+------------------+ CCA Prox  103     0                                 intimal thickening +----------+--------+--------+--------+------------------+------------------+ CCA Distal108     12                                intimal thickening +----------+--------+--------+--------+------------------+------------------+ ICA Prox  59      14                                                   +----------+--------+--------+--------+------------------+------------------+ ICA Distal63      15                                                   +----------+--------+--------+--------+------------------+------------------+ ECA       102     0                                                    +----------+--------+--------+--------+------------------+------------------+ +----------+--------+--------+----------------+-------------------+           PSV cm/sEDV cm/sDescribe        Arm Pressure (mmHG) +----------+--------+--------+----------------+-------------------+ [id number]             Multiphasic, WNL                    +----------+--------+--------+----------------+-------------------+ +---------+--------+--+--------+-+---------+  VertebralPSV cm/s46EDV cm/s6Antegrade +---------+--------+--+--------+-+---------+   Summary: Right Carotid: The extracranial vessels were near-normal with only minimal wall                thickening or plaque. Left Carotid: The extracranial vessels were near-normal with only minimal wall               thickening or plaque. Vertebrals:  Bilateral vertebral arteries demonstrate  antegrade flow. Subclavians: Normal flow hemodynamics were seen in bilateral subclavian              arteries. *See table(s) above for measurements and observations.     Preliminary    VAS [place] LOWER EXTREMITY VENOUS (DVT)  Result Date: 12/09/2020  Lower Venous DVT Study Patient Name:  DASHON MCINTIRE  Date of Exam:   12/08/2020 Medical Rec #: [id number]     Accession #:    [id number] Date of Birth: 19-Apr-1946     Patient Gender: M Patient Age:   10 years Exam Location:  [facility] Procedure:      VAS [place] LOWER EXTREMITY VENOUS (DVT) Referring Phys: Cornelius Moras XU --------------------------------------------------------------------------------  Indications: Stroke.  Limitations: Positioning and poor ultrasound/tissue interface. Comparison Study: No prior study on file Performing Technologist: Sharion Dove RVS  Examination Guidelines: Quantrell Splitt complete evaluation includes B-mode imaging, spectral Doppler, color Doppler, and power Doppler as needed of all accessible portions of each vessel. Bilateral testing is considered an integral part of Charletha Dalpe complete examination. Limited examinations for reoccurring indications may be performed as noted. The reflux portion of the exam is performed with the patient in reverse Trendelenburg.  +---------+---------------+---------+-----------+----------+--------------+ RIGHT    CompressibilityPhasicitySpontaneityPropertiesThrombus Aging +---------+---------------+---------+-----------+----------+--------------+ CFV      Full           Yes      Yes                                 +---------+---------------+---------+-----------+----------+--------------+ SFJ      Full                                                        +---------+---------------+---------+-----------+----------+--------------+ FV Prox  Full                                                        +---------+---------------+---------+-----------+----------+--------------+ FV Mid   Full                                                         +---------+---------------+---------+-----------+----------+--------------+ FV DistalFull                                                        +---------+---------------+---------+-----------+----------+--------------+ PFV      Full                                                        +---------+---------------+---------+-----------+----------+--------------+  POP      Full           Yes      Yes                                 +---------+---------------+---------+-----------+----------+--------------+ PTV      Full                                                        +---------+---------------+---------+-----------+----------+--------------+ PERO     Full                                                        +---------+---------------+---------+-----------+----------+--------------+   +---------+---------------+---------+-----------+----------+--------------+ LEFT     CompressibilityPhasicitySpontaneityPropertiesThrombus Aging +---------+---------------+---------+-----------+----------+--------------+ CFV      Full           Yes      Yes                                 +---------+---------------+---------+-----------+----------+--------------+ SFJ      Full                                                        +---------+---------------+---------+-----------+----------+--------------+ FV Prox  Full                                                        +---------+---------------+---------+-----------+----------+--------------+ FV Mid   Full                                                        +---------+---------------+---------+-----------+----------+--------------+ FV DistalFull                                                        +---------+---------------+---------+-----------+----------+--------------+ PFV      Full                                                         +---------+---------------+---------+-----------+----------+--------------+ POP      Full           Yes      Yes                                 +---------+---------------+---------+-----------+----------+--------------+  PTV      Full                                                        +---------+---------------+---------+-----------+----------+--------------+ PERO     Full                                                        +---------+---------------+---------+-----------+----------+--------------+     Summary: BILATERAL: - No evidence of deep vein thrombosis seen in the lower extremities, bilaterally. -No evidence of popliteal cyst, bilaterally.   *See table(s) above for measurements and observations. Electronically signed by Jamelle Haring on 12/09/2020 at 2:01:50 PM.    Final     Microbiology: Recent Results (from the past 240 hour(s))  Resp Panel by RT-PCR (Flu Kenadi Miltner&B, Covid) Nasopharyngeal Swab     Status: None   Collection Time: 12/07/20 12:07 PM   Specimen: Nasopharyngeal Swab; Nasopharyngeal(NP) swabs in vial transport medium  Result Value Ref Range Status   SARS Coronavirus 2 by RT PCR NEGATIVE NEGATIVE Final    Comment: (NOTE) SARS-CoV-2 target nucleic acids are NOT DETECTED.  The SARS-CoV-2 RNA is generally detectable in upper respiratory specimens during the acute phase of infection. The lowest concentration of SARS-CoV-2 viral copies this assay can detect is 138 copies/mL. Vonn Sliger negative result does not preclude SARS-Cov-2 infection and should not be used as the sole basis for treatment or other patient management decisions. Kathleene Bergemann negative result may occur with  improper specimen collection/handling, submission of specimen other than nasopharyngeal swab, presence of viral mutation(s) within the areas targeted by this assay, and inadequate number of viral copies(<138 copies/mL). Justise Ehmann negative result must be combined with clinical observations, patient history, and  epidemiological information. The expected result is Negative.  Fact Sheet for Patients:  EntrepreneurPulse.com.au  Fact Sheet for Healthcare Providers:  IncredibleEmployment.be  This test is no t yet approved or cleared by the [place] FDA and  has been authorized for detection and/or diagnosis of SARS-CoV-2 by FDA under an Emergency Use Authorization (EUA). This EUA will remain  in effect (meaning this test can be used) for the duration of the COVID-19 declaration under Section 564(b)(1) of the Act, 21 U.S.C.section 360bbb-3(b)(1), unless the authorization is terminated  or revoked sooner.       Influenza Marlyn Rabine by PCR NEGATIVE NEGATIVE Final   Influenza B by PCR NEGATIVE NEGATIVE Final    Comment: (NOTE) The Xpert Xpress SARS-CoV-2/FLU/RSV plus assay is intended as an aid in the diagnosis of influenza from Nasopharyngeal swab specimens and should not be used as Keysi Oelkers sole basis for treatment. Nasal washings and aspirates are unacceptable for Xpert Xpress SARS-CoV-2/FLU/RSV testing.  Fact Sheet for Patients: EntrepreneurPulse.com.au  Fact Sheet for Healthcare Providers: IncredibleEmployment.be  This test is not yet approved or cleared by the [place] FDA and has been authorized for detection and/or diagnosis of SARS-CoV-2 by FDA under an Emergency Use Authorization (EUA). This EUA will remain in effect (meaning this test can be used) for the duration of the COVID-19 declaration under Section 564(b)(1) of the Act, 21 U.S.C. section 360bbb-3(b)(1), unless the authorization is terminated or revoked.  Performed at [facility], [place] [address] [place], [place] [address]      Labs: Basic Metabolic Panel: Recent Labs  Lab 12/09/20 0113 12/10/20 0209 12/11/20 0153 12/12/20 0229 12/13/20 0033  NA 136 134* 135 136 137  K 4.1 3.8 3.8 3.9 3.7  CL 111 110 108 109 111  CO2 19* 20* 21* 21* 20*   GLUCOSE 124* 93 102* 108* 102*  BUN _0 CREATININE 0.88 0.93 0.94 0.89 0.76  CALCIUM 8.2* 8.1* 8.0* 7.7* 7.8*   Liver Function Tests: Recent Labs  Lab 12/09/20 0113 12/09/20 0948 12/10/20 0209 12/11/20 0153 12/12/20 0229 12/13/20 0033  AST 40  --  35 28 32 34  ALT 20  --  _1 ALKPHOS 70  --  72 67 70 72  BILITOT 2.2* 2.2* 2.0* 1.5* 1.1 1.0  PROT 4.6*  --  4.7* 4.6* 4.6* 4.7*  ALBUMIN 2.8*  --  2.6* 2.5* 2.3* 2.3*   No results for input(s): LIPASE, AMYLASE in the last 168 hours. No results for input(s): AMMONIA in the last 168 hours. CBC: Recent Labs  Lab 12/07/20 1207 12/08/20 1105 12/09/20 0948 12/10/20 0209 12/11/20 0153 12/12/20 0229 12/13/20 0033  WBC 8.8   < > 9.9 9.4 9.7 8.1 7.4  NEUTROABS 7.7  --  8.2*  --   --   --   --   HGB 15.2   < > 12.3* 11.6* 11.7* 11.5* 11.8*  HCT 43.1   < > 34.8* 33.1* 34.5* 33.2* 33.7*  MCV 90.4   < > 91.8 91.7 93.0 91.7 90.8  PLT 103*   < > 75* 76* 80* 90* 116*   < > = values in this interval not displayed.   Cardiac Enzymes: Recent Labs  Lab 12/07/20 1207  CKTOTAL 82   BNP: BNP (last 3 results) Recent Labs    12/13/20 0033  BNP 128.6*    ProBNP (last 3 results) No results for input(s): PROBNP in the last 8760 hours.  CBG: Recent Labs  Lab 12/07/20 1151  GLUCAP 111*       Signed:  Fayrene Helper MD.  [facility] 12/13/2020, 5:41 PM

## 2020-12-13 NOTE — Progress Notes (Signed)
Signed      Show:Clear all Written[x] Templated[x] Copied  Added by: Standley Brooking, RN  Hover for details                                                                                                                                                                                                                                                                                                             PMR Admission Coordinator Pre-Admission Assessment   Patient: [name] is an 75 y.o., male MRN: [id number] DOB: May 01, 1946 Height: 5' 8.5" (174 cm) Weight: 72.6 kg                                                                                                                                                  Insurance Information   PRIMARY: Medicare a and b      Policy#: [id number]      Subscriber: pt Benefits:  Phone #: passport on online     Name: 8/8 Eff. Date: 10/03/2012     Deduct: $1556      Out of Pocket Max: none      Life Max: none  CIR: 100%      SNF: 20 full days Outpatient: 80%     Co-Pay: 20% Home Health: 100%      Co-Pay: none DME: 80%     Co-Pay: 20% Providers: pt choice  SECONDARY: Artist  Policy#: [id number]   Financial Counselor:       Phone#:    The "Data Collection Information Summary" for patients in Inpatient Rehabilitation Facilities with attached "Privacy Act Statement-Health Care Records" was provided and verbally reviewed with: Patient and Family   Emergency Contact Information Contact Information       Name Relation Home Work Mobile    Champoux,gelma Spouse     [phone]         Current Medical History  Patient Admitting Diagnosis: nontraumatic quadriplegia   History of Present Illness: 75 year old right-handed male unremarkable past medical history except he did have a history of spinal stenosis C7-T1 not requiring surgical  intervention and on no prescription medications.    Presented 12/07/2020 with onset of progressive diffuse weakness pain across his neck/shoulders and inability to walk.  Urine drug screen was negative.  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  Neurology consulted Dr. 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  Dr.Xu felt that stroke to brain/spine likely cardioembolic in nature and recommended DAPT as well as loop recorder to rule out atrial fibrillation.  Carotid Dopplers negative for ICA stenosis.  Bilateral extremity Dopplers negative.  Echocardiogram with ejection fraction of 60 to 65% no wall motion abnormalities.  Dr. Bertis RuddyGorsuch of hematology service consulted for input on pancytopenia question metastatic prostate cancer due to work-up revealing enlarged prostate and elevated PSA.  Bone scan with bone biopsy recommended for work-up of pancytopenia.  Family not interested in aggressive cancer work-up if patient did not have the ability to gain prior level of independence.  He was cleared to begin Lovenox for DVT prophylaxis.  He was placed on aspirin Plavix for CVA prophylaxis x3 weeks total then aspirin alone.   Complete NIHSS TOTAL: 14 Glasgow Coma Scale Score: 15   Past Medical History  History reviewed. No pertinent past medical history.   Family  History  family history includes Cancer in his mother; Clotting disorder in his father; Dementia in his mother; Heart attack in his father.   Prior Rehab/Hospitalizations:  Has the patient had prior rehab or hospitalizations prior to admission? Yes   Has the patient had major surgery during 100 days prior to admission? Yes   Current Medications    Current Facility-Administered Medications:    [COMPLETED] sodium chloride 0.9 % bolus 1,000 mL, 1,000 mL, Intravenous, Once, Stopped at 12/07/20 1421 **AND** 0.9 %  sodium chloride infusion, , Intravenous, Continuous, Osvaldo ShipperKrishnan, Gokul, MD, Last Rate: 75 mL/hr at 12/12/20 1245, New Bag at 12/12/20 1245   acetaminophen (TYLENOL) tablet 650 mg, 650 mg, Oral, Q4H PRN, 650 mg at 12/10/20 1423 **OR** acetaminophen (TYLENOL) 160 MG/5ML solution 650 mg, 650 mg, Per Tube, Q4H PRN, 650 mg at 12/13/20 0419 **OR** acetaminophen (TYLENOL) suppository 650 mg, 650 mg, Rectal, Q4H PRN, Synetta FailMelvin, Alexander B, MD   aspirin EC tablet 81 mg, 81 mg, Oral, Daily, Marvel PlanXu, Jindong, MD, 81 mg at 12/13/20 0823   atorvastatin (LIPITOR) tablet 80 mg,  80 mg, Oral, Daily, Marvel Plan, MD, 80 mg at 12/13/20 1610   Chlorhexidine Gluconate Cloth 2 % PADS 6 each, 6 each, Topical, Daily, Osvaldo Shipper, MD, 6 each at 12/12/20 0940   clopidogrel (PLAVIX) tablet 75 mg, 75 mg, Oral, Daily, Marvel Plan, MD, 75 mg at 12/13/20 0823   enoxaparin (LOVENOX) injection 40 mg, 40 mg, Subcutaneous, Q24H, Marvel Plan, MD, 40 mg at 12/12/20 1748   pantoprazole (PROTONIX) EC tablet 40 mg, 40 mg, Oral, Daily, Zigmund Daniel., MD, 40 mg at 12/13/20 9604   polyvinyl alcohol (LIQUIFILM TEARS) 1.4 % ophthalmic solution 1 drop, 1 drop, Both Eyes, PRN, Marvel Plan, MD   senna-docusate (Senokot-S) tablet 1 tablet, 1 tablet, Oral, QHS PRN, Synetta Fail, MD   Patients Current Diet:  Diet Order                  DIET DYS 3 Room service appropriate? Yes; Fluid consistency: Thin  Diet effective now                          Precautions / Restrictions Precautions Precautions: Back Precaution Booklet Issued: No Precaution Comments: pathologic thoracic fracture- back precautions for safety Restrictions Weight Bearing Restrictions: No Other Position/Activity Restrictions: protection of spine    Has the patient had 2 or more falls or a fall with injury in the past year?No   Prior Activity Level Community (5-7x/wk): Independent and active   Prior Functional Level Prior Function Level of Independence: Independent Comments: Pt reports he was fully independent PTA   Self Care: Did the patient need help bathing, dressing, using the toilet or eating?  Independent   Indoor Mobility: Did the patient need assistance with walking from room to room (with or without device)? Independent   Stairs: Did the patient need assistance with internal or external stairs (with or without device)? Independent   Functional Cognition: Did the patient need help planning regular tasks such as shopping or remembering to take medications? Independent   Home Assistive Devices / Equipment Home Assistive Devices/Equipment: None   Prior Device Use: Indicate devices/aids used by the patient prior to current illness, exacerbation or injury? None of the above   Current Functional Level Cognition   Overall Cognitive Status: Within Functional Limits for tasks assessed Orientation Level: Oriented X4 General Comments: polite and cooperative with therapy- engaged, but just more fatigued today and tells me he had a rough night/wasn't able to get much sleep. Very motivated to improve    Extremity Assessment (includes Sensation/Coordination)   Upper Extremity Assessment: LUE deficits/detail, RUE deficits/detail RUE Deficits / Details: Pt demonstrates shoulder abduction grossly 4-/5; elbow flexion 3-/5; forearm 3-5/5; wrist extension 3-/5; tricep ? 1/5, hand ? 1/5 (difficult to accurately assess, but questionable  palpable contraction noted finger flexors/extensors and tricep.  PROM WFL RUE Sensation: decreased proprioception RUE Coordination: decreased gross motor, decreased fine motor LUE Deficits / Details: Pt demonstrates shoulders grossly 4-/5; elbow flexion 3+/5; forearm 3/5; wrist extension 3+/5; tricep 1/5, hand 1/5.  PROM WFL.  pt now able to eccentrically relax bicep to use gravity to assist with elbow extension LUE Sensation: decreased proprioception LUE Coordination: decreased gross motor, decreased fine motor  Lower Extremity Assessment: Defer to PT evaluation     ADLs   Overall ADL's : Needs assistance/impaired Eating/Feeding: Maximal assistance, Sitting Eating/Feeding Details (indicate cue type and reason): While sitting EOB with support from physical therapy and cues as needed for balance,  pt used RT hand with red adaptive foam handle on feeding utensils to work on self-feeding. Pt required assist to place food on fork as pt unabel to extend elbow sufficiently to reach plate. Pt then able to bring fork to mouth with Min As to steady RUE and pt compensating by bringing head down to hand. Pt performed this x 6-7 bites, and 2-3 sips of milk with Total assist to bring carton and straw to mouth using hand over hand to allow pt to participate in task. Pt sat for task for 10-15 min then became fatigued and asked to return to supine. Pt was initially Min As to maintain EOB sittng, but with fatigue pt gradually declined to need of Max As with posterior and RT sided leaning. Functional mobility during ADLs: Total assistance, +2 for physical assistance, Cueing for sequencing General ADL Comments: Pt is dependent with all aspects of ADLs     Mobility   Overal bed mobility: Needs Assistance Bed Mobility: Rolling Rolling: +2 for physical assistance, Total assist Sidelying to sit: Total assist, +2 for physical assistance Sit to sidelying: Total assist, +2 for physical assistance General bed mobility  comments: totalAx2 for rolling- unfortunatley unable to get to EOB today as every time we moved he had a burst of liquid bowel movement, had to leave him on bedpan with nursing staff present     Transfers   General transfer comment: deferred- active liquid BMs     Ambulation / Gait / Stairs / Wheelchair Mobility   Ambulation/Gait General Gait Details: unable     Posture / Balance Dynamic Sitting Balance Sitting balance - Comments: majority of work of session sitting EOB with level of assist ranging from Max/Total A to brief moments of min/minguard assist; Tolerated 15-18 minutes of sitting EOB with work on active anterior pelvic tilt in conjunction with lower and upper spine extension; Bil elbows supported on bolsters (2-3 folded blankets tightly packed in pillow cases); no dizziness sitting EOB; opted to get up on R side of teh bed (for the view out the window) Balance Overall balance assessment: Needs assistance Sitting-balance support: Feet supported Sitting balance-Leahy Scale: Poor Sitting balance - Comments: majority of work of session sitting EOB with level of assist ranging from Max/Total A to brief moments of min/minguard assist; Tolerated 15-18 minutes of sitting EOB with work on active anterior pelvic tilt in conjunction with lower and upper spine extension; Bil elbows supported on bolsters (2-3 folded blankets tightly packed in pillow cases); no dizziness sitting EOB; opted to get up on R side of teh bed (for the view out the window) Standing balance comment: unable to attempt     Special needs/care consideration LOOP placement 8/11 Wife requests that Dr Bertis Ruddy not be his Oncology/ Hematology  MD To follow up outpatient for further workup with Hematology/oncology for definitive diagnosis    Previous Home Environment  Living Arrangements: Spouse/significant other  Lives With: Spouse Available Help at Discharge: Family, Available 24 hours/day Type of Home: House Home Layout: One  level Home Access: Level entry, Ramped entrance Bathroom Shower/Tub: Tub/shower unit, Health visitor: Handicapped height Bathroom Accessibility: Yes How Accessible: Accessible via walker, Accessible via wheelchair Home Care Services: No Additional Comments: Pt reports he lives with his wife.   Discharge Living Setting Plans for Discharge Living Setting: Patient's home, Lives with (comment) Type of Home at Discharge: House Discharge Home Layout: One level Discharge Home Access: Level entry Discharge Bathroom Shower/Tub: Tub/shower unit Discharge Bathroom Toilet: Handicapped  height Discharge Bathroom Accessibility: Yes How Accessible: Accessible via walker, Accessible via wheelchair Does the patient have any problems obtaining your medications?: No   Social/Family/Support Systems Patient Roles: Spouse, Parent Contact Information: wife, [name] Anticipated Caregiver: wife and family Anticipated Caregiver's Contact Information: see above Ability/Limitations of Caregiver: no limits Caregiver Availability: 24/7 Discharge Plan Discussed with Primary Caregiver: Yes Is Caregiver In Agreement with Plan?: Yes Does Caregiver/Family have Issues with Lodging/Transportation while Pt is in Rehab?: No   Son and daughter in law local work, daughter and her husband from [place] but may be able to assist initially   Goals Patient/Family Goal for Rehab: mod asisst with PT and OT at wheelchair level, supervision wiht SLP Expected length of stay: [facility] 4 to 5 weeks Pt/Family Agrees to Admission and willing to participate: Yes Program Orientation Provided & Reviewed with Pt/Caregiver Including Roles  & Responsibilities: Yes   Decrease burden of Care through IP rehab admission: n/a   Possible need for SNF placement upon discharge:not anticipated   Patient Condition: This patient's condition remains as documented in the consult dated 12/11/2020, in which the Rehabilitation Physician  determined and documented that the patient's condition is appropriate for intensive rehabilitative care in an inpatient rehabilitation facility. Will admit to inpatient rehab today.   Preadmission Screen Completed By:  Clois Dupes, RN, 12/13/2020 1:07 PM ______________________________________________________________________   Discussed status with Dr. Berline Chough on 12/13/2020 at 1305 and received approval for admission today.   Admission Coordinator:  Clois Dupes, time 7253 Date 12/13/2020            Revision History                               Note Details  Author Genice Rouge, MD File Time 12/13/2020  2:10 PM  Author Type Physician Status Signed  Last Editor Genice Rouge, MD Service Physical Medicine and Rehabilitation

## 2020-12-13 NOTE — Progress Notes (Signed)
Inpatient Rehabilitation Admissions Coordinator   I met with patient , his wife and son at bedside. We will arrange admit to CIR today after LOOP placement. All in agreement. I have alerted acute team and TOC.  Danne Baxter, RN, MSN Rehab Admissions Coordinator [phone] 12/13/2020 1:02 PM

## 2020-12-13 NOTE — Discharge Instructions (Signed)
Post procedure wound care instructions (loop recorder/heart monitor implant site)  You can remove outer dressing tomorrow. Leave steri-strips (little pieces of tape) on until seen in the office for wound check appointment. Call the office [phone]) for redness, drainage, swelling, or fever.

## 2020-12-13 NOTE — H&P (Signed)
Physical Medicine and Rehabilitation Admission H&P         Chief Complaint  Patient presents with   Weakness      Not new, but vast difference from yesterday to today  : HPI: [name] is a 75 year old right-handed male unremarkable past medical history except he did have a history of spinal stenosis C7-T1 not requiring surgical intervention and on no prescription medications.  Per chart review patient lives with spouse.  Independent prior to admission.  Presented 12/07/2020 with onset of progressive diffuse weakness pain across his neck/shoulders and inability to walk.  Urine drug screen was negative.  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  Neurology consulted Dr. 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  Dr.Xu felt that stroke to brain/spine likely cardioembolic in nature and recommended DAPT as well as loop recorder to rule out atrial fibrillation .  Carotid Dopplers negative for ICA stenosis.  Bilateral extremity Dopplers negative.  Echocardiogram with ejection fraction of 60 to 65% no wall motion abnormalities.  Dr. Bertis Ruddy of hematology service consulted for input on pancytopenia question metastatic prostate cancer due to work-up revealing enlarged prostate and elevated PSA.  Bone scan with bone biopsy recommended for work-up of  pancytopenia.  Family not interested in aggressive cancer work-up if patient did not have the ability to gain prior level of independence.  He was cleared to begin Lovenox for DVT prophylaxis.  He was placed on aspirin Plavix for CVA prophylaxis x3 weeks total then aspirin alone.  Therapy evaluations completed due to patient's quadriparesis was admitted for a comprehensive rehab program.     Pt c/o pain in shoulders, B/L- and posterior neck at night esp-- all day but worse at night and tylenol helps.  Also spoke to hospitalist about his new scrotal swelling- .    Admits he's been somewhat depressed since this occurred.      Review of Systems  Constitutional:  Negative for chills and fever.  HENT:  Negative for hearing loss.   Eyes:  Negative for blurred vision and double vision.  Respiratory:  Negative for cough and shortness of breath.   Cardiovascular:  Negative for chest pain and palpitations.  Gastrointestinal:  Positive for constipation. Negative for heartburn, nausea and vomiting.  Genitourinary:  Positive for urgency.  Musculoskeletal:  Positive for back pain, joint pain, myalgias and neck pain.  Skin:  Negative for rash.  Neurological:  Positive for weakness.  All other systems reviewed and are negative. History reviewed. No pertinent past medical history.      Past Surgical History:  Procedure Laterality Date   TONSILLECTOMY             Family History  Problem Relation Age of Onset   Cancer Mother     Dementia Mother     Heart attack Father     Clotting disorder Father      Social History:  reports that he has never smoked. He has never  used smokeless tobacco. He reports that he does not currently use alcohol. He reports that he does not use drugs. Allergies: No Known Allergies       Medications Prior to Admission  Medication Sig Dispense Refill   acetaminophen (TYLENOL) 500 MG tablet Take 1,000 mg by mouth every 6 (six) hours as needed (pain).       Ascorbic Acid  (VITAMIN C PO) Take 1 tablet by mouth daily.       Cholecalciferol (VITAMIN D3 PO) Take 1 tablet by mouth daily.       Cyanocobalamin (VITAMIN B-12 PO) Take 1 drop by mouth daily.       Multiple Vitamin (MULTIVITAMIN WITH MINERALS) TABS tablet Take 1 tablet by mouth daily.          Drug Regimen Review Drug regimen was reviewed and remains appropriate with no significant issues identified   Home: Home Living Family/patient expects to be discharged to:: Private residence Living Arrangements: Alone, Spouse/significant other Available Help at Discharge: Family, Available 24 hours/day Additional Comments: Pt reports he lives with his wife.   Functional History: Prior Function Level of Independence: Independent Comments: Pt reports he was fully independent PTA   Functional Status:  Mobility: Bed Mobility Overal bed mobility: Needs Assistance Bed Mobility: Rolling, Sidelying to Sit, Sit to Sidelying Rolling: +2 for physical assistance, Total assist Sidelying to sit: Total assist, +2 for physical assistance Sit to sidelying: Total assist, +2 for physical assistance General bed mobility comments: totalAx2 for rolling; totalAx2 for management of BLEs and trunk getting to/from EOB while maintaining back precautions Transfers General transfer comment: deferred- fatigue Ambulation/Gait General Gait Details: unable   ADL: ADL Overall ADL's : Needs assistance/impaired Eating/Feeding: Maximal assistance, Sitting Eating/Feeding Details (indicate cue type and reason): While sitting EOB with support from physical therapy and cues as needed for balance, pt used RT hand with red adaptive foam handle on feeding utensils to work on self-feeding. Pt required assist to place food on fork as pt unabel to extend elbow sufficiently to reach plate. Pt then able to bring fork to mouth with Min As to steady RUE and pt compensating by bringing head down to hand. Pt performed this x 6-7 bites, and 2-3 sips of  milk with Total assist to bring carton and straw to mouth using hand over hand to allow pt to participate in task. Pt sat for task for 10-15 min then became fatigued and asked to return to supine. Pt was initially Min As to maintain EOB sittng, but with fatigue pt gradually declined to need of Max As with posterior and RT sided leaning. Functional mobility during ADLs: Total assistance, +2 for physical assistance, Cueing for sequencing General ADL Comments: Pt is dependent with all aspects of ADLs   Cognition: Cognition Overall Cognitive Status: Within Functional Limits for tasks assessed Orientation Level: Oriented X4 Cognition Arousal/Alertness: Awake/alert Behavior During Therapy: WFL for tasks assessed/performed, Flat affect Overall Cognitive Status: Within Functional Limits for tasks assessed General Comments: polite and cooperative with therapy, More engaged in conversation when talking about his favorite books, Chartered loss adjusterauthor, music. Motivated to participate   Physical Exam: Blood pressure (!) 145/79, pulse 72, temperature 97.9 F (36.6 C), temperature source Oral, resp. rate 20, height 5' 8.5" (1.74 m), weight 72.6 kg, SpO2 94 %. Physical Exam Vitals and nursing note reviewed. Exam conducted with a chaperone present.  Constitutional:      Appearance: He is normal weight.     Comments: Older male sitting up slightly  in bed; son and wife at bedside; has soft call bell; has global weakness, NAD  HENT:     Head: Normocephalic and atraumatic.     Right Ear: External ear normal.     Left Ear: External ear normal.     Nose: Nose normal. No congestion.     Mouth/Throat:     Mouth: Mucous membranes are dry.     Pharynx: Oropharynx is clear. No oropharyngeal exudate.  Eyes:     General:        Right eye: No discharge.        Left eye: No discharge.     Extraocular Movements: Extraocular movements intact.  Neck:     Comments: Has pillow rolled up behind neck- muscles tight esp in upper traps  and scalenes Cardiovascular:     Rate and Rhythm: Normal rate and regular rhythm.     Heart sounds: Normal heart sounds. No murmur heard.   No gallop.  Pulmonary:     Comments: CTA B/L- no W/R/R- good air movement     Abdominal:     Comments: Just had BM- Soft, NT, ND, (+)BS - hypoactive  Genitourinary:    Comments: Has foley- scrotum size of large apple- stretched/no wrinkles.  Musculoskeletal:     Comments: RUE- biceps 4+/5, WE 3-/5, triceps 2-/5, grip 2-/5, finger abd 0/5 LUE- biceps 5-/5, WE 3-/5, triceps 0/5, grip 1/5, and finger abd 0/5 LE's 0/5 in HF, KE, DF, PF and EHL B/L  ASIA exam completed-  B/L shoulders/deltoids TTP- esp just past AC joints- less TTP over RTC/posterior shoulder- B/L shoulders TTP Also TTP/tight around cervical paraspinals and splenius captis and scalenes B/L   Skin:    General: Skin is warm and dry.     Comments: No skin breakdown seen; no LE edema  Neurological:     Comments: Patient is alert.  No acute distress.  He is somewhat hard of hearing.  Oriented x3. C5 intact to light touch; however decreased from C6 to S1 B/L - so checked every level in C5-T2; T3-T12 and L1-S1- also c/o decreased sensation on buttocks- like "asleep".    MAS of LUE and MAS of 1 in RUE- MAS normal/zero in LE's B/L   Psychiatric:     Comments: Smiled some, but obviously flat/somewhat depressed      Lab Results Last 48 Hours        Results for orders placed or performed during the hospital encounter of 12/07/20 (from the past 48 hour(s))  CBC     Status: Abnormal    Collection Time: 12/12/20  2:29 AM  Result Value Ref Range    WBC 8.1 4.0 - 10.5 K/uL    RBC 3.62 (L) 4.22 - 5.81 MIL/uL    Hemoglobin 11.5 (L) 13.0 - 17.0 g/dL    HCT 40.0 (L) 86.7 - 52.0 %    MCV 91.7 80.0 - 100.0 fL    MCH 31.8 26.0 - 34.0 pg    MCHC 34.6 30.0 - 36.0 g/dL    RDW 61.9 50.9 - 32.6 %    Platelets 90 (L) 150 - 400 K/uL      Comment: Immature Platelet Fraction may be clinically  indicated, consider ordering this additional test [id number] CONSISTENT WITH PREVIOUS RESULT REPEATED TO VERIFY      nRBC 0.0 0.0 - 0.2 %      Comment: Performed at [facility], [address], [place], [place] [address]  Comprehensive metabolic panel  Status: Abnormal    Collection Time: 12/12/20  2:29 AM  Result Value Ref Range    Sodium 136 135 - 145 mmol/L    Potassium 3.9 3.5 - 5.1 mmol/L    Chloride 109 98 - 111 mmol/L    CO2 21 (L) 22 - 32 mmol/L    Glucose, Bld 108 (H) 70 - 99 mg/dL      Comment: Glucose reference range applies only to samples taken after fasting for at least 8 hours.    BUN 19 8 - 23 mg/dL    Creatinine, Ser 1.61 0.61 - 1.24 mg/dL    Calcium 7.7 (L) 8.9 - 10.3 mg/dL    Total Protein 4.6 (L) 6.5 - 8.1 g/dL    Albumin 2.3 (L) 3.5 - 5.0 g/dL    AST 32 15 - 41 U/L    ALT 24 0 - 44 U/L    Alkaline Phosphatase 70 38 - 126 U/L    Total Bilirubin 1.1 0.3 - 1.2 mg/dL    GFR, Estimated >09 >60 mL/min      Comment: (NOTE) Calculated using the CKD-EPI Creatinine Equation (2021)      Anion gap 6 5 - 15      Comment: Performed at [facility], [address], [place], [place] [address]  CBC     Status: Abnormal    Collection Time: 12/13/20 12:33 AM  Result Value Ref Range    WBC 7.4 4.0 - 10.5 K/uL    RBC 3.71 (L) 4.22 - 5.81 MIL/uL    Hemoglobin 11.8 (L) 13.0 - 17.0 g/dL    HCT 81.1 (L) 91.4 - 52.0 %    MCV 90.8 80.0 - 100.0 fL    MCH 31.8 26.0 - 34.0 pg    MCHC 35.0 30.0 - 36.0 g/dL    RDW 78.2 95.6 - 21.3 %    Platelets 116 (L) 150 - 400 K/uL      Comment: CONSISTENT WITH PREVIOUS RESULT REPEATED TO VERIFY      nRBC 0.0 0.0 - 0.2 %      Comment: Performed at [facility], [address], [place], [place] [address]  Comprehensive metabolic panel     Status: Abnormal    Collection Time: 12/13/20 12:33 AM  Result Value Ref Range    Sodium 137 135 - 145 mmol/L    Potassium 3.7 3.5 - 5.1 mmol/L    Chloride 111 98 - 111 mmol/L    CO2  20 (L) 22 - 32 mmol/L    Glucose, Bld 102 (H) 70 - 99 mg/dL      Comment: Glucose reference range applies only to samples taken after fasting for at least 8 hours.    BUN 17 8 - 23 mg/dL    Creatinine, Ser 8.46 0.61 - 1.24 mg/dL    Calcium 7.8 (L) 8.9 - 10.3 mg/dL    Total Protein 4.7 (L) 6.5 - 8.1 g/dL    Albumin 2.3 (L) 3.5 - 5.0 g/dL    AST 34 15 - 41 U/L    ALT 29 0 - 44 U/L    Alkaline Phosphatase 72 38 - 126 U/L    Total Bilirubin 1.0 0.3 - 1.2 mg/dL    GFR, Estimated >96 >29 mL/min      Comment: (NOTE) Calculated using the CKD-EPI Creatinine Equation (2021)      Anion gap 6 5 - 15      Comment: Performed at [facility],  [address], [place], [place] [address]       Imaging Results (Last 48 hours)  NM Bone Scan Whole Body   Result Date: 12/11/2020 CLINICAL DATA:  Bone lesions on MRI. Concern for metastatic skeletal disease. EXAM: NUCLEAR MEDICINE WHOLE BODY BONE SCAN TECHNIQUE: Whole body anterior and posterior images were obtained approximately 3 hours after intravenous injection of radiopharmaceutical. RADIOPHARMACEUTICALS:  Twenty mCi Technetium-83m MDP IV COMPARISON:  Cervical MRI 12/08/2020 FINDINGS: There is a linear segment of abnormal radiotracer activity within the posterior RIGHT cervical paraspinal musculature which corresponds to abnormality within the paraspinal musculature on comparison MRI (image 27/21 on 12/07/20 and image 32/8 on MRI 12/08/2020). Activity at the level the thoracic inlet anteriorly may correlate the T1 lesion described on comparison MRIs. Elsewhere within the skeleton there is no foci of abnormal radiotracer activity. IMPRESSION: 1. Linear uptake within the paraspinal musculature pf RIGHT cervical spine posteriorly corresponds to abnormal signal intensity on comparison MRI. 2. Focal uptake anterior to the thoracic inlet suggest correlation with the T1 vertebral body lesion. 3. Above findings may relate to acute trauma, infection, or neoplasm. 4.  No additional evidence of metastatic skeletal disease. Electronically Signed   By: Genevive Bi M.D.   On: 12/11/2020 19:09             Medical Problem List and Plan: 1.  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  Status post plan loop recorder              -patient may  shower after 3 days- since getting loop recorder             -ELOS/Goals: 3-4 weeks- min-mod Assist 2.  Antithrombotics: -DVT/anticoagulation: Venous Doppler studies negative.  Continue Lovenox Pharmaceutical: Lovenox             -antiplatelet therapy: Aspirin 81 mg daily and Plavix 75 mg day x3 weeks and aspirin alone 3. Pain Management: Tylenol and will add Lidoderm patches 8pm to 8am -3 of them 4. Mood: Provide emotional support             -antipsychotic agents: N/A 5. Neuropsych: This patient is capable of making decisions on his own behalf. 6. Skin/Wound Care: Routine skin checks 7. Fluids/Electrolytes/Nutrition: Routine in and outs with follow-up chemistries 8.  Thrombocytopenia.  Follow-up with hematology services 9.  Neurogenic bowel bladder.  Foley catheter tube has been placed for multiple in and out catheterization- will wait til tomorrow to start bowel program- educated pt and wife/son on bowel program. Will add Lidocaine jelly for bowel program.  10.  Elevated PSA/enlarged prostate.  Family currently not committed to oncology work-up. 11. Depression and insomnia- will start Celexa 10 mg daily and melatonin 3 mg QHS prn.  12. Scrotal swelling- will elevate and Hospitalist is checking pro-BNP and giving a dose of Lasix.  Will monitor/follow.    Mcarthur Rossetti Angiulli, PA-C 12/13/2020    I have personally performed a face to face diagnostic  evaluation of this patient and formulated the key components of the plan.  Additionally, I have personally reviewed laboratory data, imaging studies, as well as relevant notes and concur with the physician assistant's documentation above.   The patient's status has not changed from the original H&P.  Any changes in documentation from the acute  care chart have been noted above.

## 2020-12-13 NOTE — H&P (Signed)
Physical Medicine and Rehabilitation Admission H&P    Chief Complaint  Patient presents with   Weakness    Not new, but vast difference from yesterday to today  : HPI: [name] is a 75 year old right-handed male unremarkable past medical history except he did have a history of spinal stenosis C7-T1 not requiring surgical intervention and on no prescription medications.  Per chart review patient lives with spouse.  Independent prior to admission.  Presented 12/07/2020 with onset of progressive diffuse weakness pain across his neck/shoulders and inability to walk.  Urine drug screen was negative.  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  Neurology consulted Dr. 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  Dr.Xu felt that stroke to brain/spine likely cardioembolic in nature and recommended DAPT as well as loop recorder to rule out atrial fibrillation .  Carotid Dopplers negative for ICA stenosis.  Bilateral extremity Dopplers negative.  Echocardiogram with ejection fraction of 60 to 65% no wall motion abnormalities.  Dr. Bertis Ruddy of hematology service consulted for input on pancytopenia question metastatic prostate cancer due to work-up revealing enlarged prostate and elevated PSA.  Bone scan with bone biopsy recommended for work-up of pancytopenia.   Family not interested in aggressive cancer work-up if patient did not have the ability to gain prior level of independence.  He was cleared to begin Lovenox for DVT prophylaxis.  He was placed on aspirin Plavix for CVA prophylaxis x3 weeks total then aspirin alone.  Therapy evaluations completed due to patient's quadriparesis was admitted for a comprehensive rehab program.   Pt c/o pain in shoulders, B/L- and posterior neck at night esp-- all day but worse at night and tylenol helps.  Also spoke to hospitalist about his new scrotal swelling- .   Admits he's been somewhat depressed since this occurred.    Review of Systems  Constitutional:  Negative for chills and fever.  HENT:  Negative for hearing loss.   Eyes:  Negative for blurred vision and double vision.  Respiratory:  Negative for cough and shortness of breath.   Cardiovascular:  Negative for chest pain and palpitations.  Gastrointestinal:  Positive for constipation. Negative for heartburn, nausea and vomiting.  Genitourinary:  Positive for urgency.  Musculoskeletal:  Positive for back pain, joint pain, myalgias and neck pain.  Skin:  Negative for rash.  Neurological:  Positive for weakness.  All other systems reviewed and are negative. History reviewed. No pertinent past medical history. Past Surgical History:  Procedure Laterality Date   TONSILLECTOMY     Family History  Problem Relation Age of Onset   Cancer Mother    Dementia Mother    Heart attack Father    Clotting disorder Father    Social History:  reports that he has never smoked. He has never used smokeless tobacco. He reports that he does not currently use alcohol. He reports that he does not use drugs. Allergies: No Known Allergies Medications Prior to Admission  Medication  Sig Dispense Refill   acetaminophen (TYLENOL) 500 MG tablet Take 1,000 mg by mouth every 6 (six) hours as needed (pain).     Ascorbic Acid (VITAMIN C PO) Take 1 tablet by mouth daily.      Cholecalciferol (VITAMIN D3 PO) Take 1 tablet by mouth daily.     Cyanocobalamin (VITAMIN B-12 PO) Take 1 drop by mouth daily.     Multiple Vitamin (MULTIVITAMIN WITH MINERALS) TABS tablet Take 1 tablet by mouth daily.      Drug Regimen Review Drug regimen was reviewed and remains appropriate with no significant issues identified  Home: Home Living Family/patient expects to be discharged to:: Private residence Living Arrangements: Alone, Spouse/significant other Available Help at Discharge: Family, Available 24 hours/day Additional Comments: Pt reports he lives with his wife.   Functional History: Prior Function Level of Independence: Independent Comments: Pt reports he was fully independent PTA  Functional Status:  Mobility: Bed Mobility Overal bed mobility: Needs Assistance Bed Mobility: Rolling, Sidelying to Sit, Sit to Sidelying Rolling: +2 for physical assistance, Total assist Sidelying to sit: Total assist, +2 for physical assistance Sit to sidelying: Total assist, +2 for physical assistance General bed mobility comments: totalAx2 for rolling; totalAx2 for management of BLEs and trunk getting to/from EOB while maintaining back precautions Transfers General transfer comment: deferred- fatigue Ambulation/Gait General Gait Details: unable    ADL: ADL Overall ADL's : Needs assistance/impaired Eating/Feeding: Maximal assistance, Sitting Eating/Feeding Details (indicate cue type and reason): While sitting EOB with support from physical therapy and cues as needed for balance, pt used RT hand with red adaptive foam handle on feeding utensils to work on self-feeding. Pt required assist to place food on fork as pt unabel to extend elbow sufficiently to reach plate. Pt then able to bring fork to mouth with Min As to steady RUE and pt compensating by bringing head down to hand. Pt performed this x 6-7 bites, and 2-3 sips of milk with Total assist to bring carton and straw to mouth  using hand over hand to allow pt to participate in task. Pt sat for task for 10-15 min then became fatigued and asked to return to supine. Pt was initially Min As to maintain EOB sittng, but with fatigue pt gradually declined to need of Max As with posterior and RT sided leaning. Functional mobility during ADLs: Total assistance, +2 for physical assistance, Cueing for sequencing General ADL Comments: Pt is dependent with all aspects of ADLs  Cognition: Cognition Overall Cognitive Status: Within Functional Limits for tasks assessed Orientation Level: Oriented X4 Cognition Arousal/Alertness: Awake/alert Behavior During Therapy: WFL for tasks assessed/performed, Flat affect Overall Cognitive Status: Within Functional Limits for tasks assessed General Comments: polite and cooperative with therapy, More engaged in conversation when talking about his favorite books, Chartered loss adjusterauthor, music. Motivated to participate  Physical Exam: Blood pressure (!) 145/79, pulse 72, temperature 97.9 F (36.6 C), temperature source Oral, resp. rate 20, height 5' 8.5" (1.74 m), weight 72.6 kg, SpO2 94 %. Physical Exam Vitals and nursing note reviewed. Exam conducted with a chaperone present.  Constitutional:      Appearance: He is normal weight.     Comments: Older male sitting up slightly in bed; son and wife at bedside; has soft call bell; has global weakness, NAD  HENT:     Head: Normocephalic and atraumatic.     Right Ear: External ear normal.     Left Ear: External ear normal.     Nose: Nose normal. No  congestion.     Mouth/Throat:     Mouth: Mucous membranes are dry.     Pharynx: Oropharynx is clear. No oropharyngeal exudate.  Eyes:     General:        Right eye: No discharge.        Left eye: No discharge.     Extraocular Movements: Extraocular movements intact.  Neck:     Comments: Has pillow rolled up behind neck- muscles tight esp in upper traps and scalenes Cardiovascular:     Rate and Rhythm: Normal  rate and regular rhythm.     Heart sounds: Normal heart sounds. No murmur heard.   No gallop.  Pulmonary:     Comments: CTA B/L- no W/R/R- good air movement   Abdominal:     Comments: Just had BM- Soft, NT, ND, (+)BS - hypoactive  Genitourinary:    Comments: Has foley- scrotum size of large apple- stretched/no wrinkles.  Musculoskeletal:     Comments: RUE- biceps 4+/5, WE 3-/5, triceps 2-/5, grip 2-/5, finger abd 0/5 LUE- biceps 5-/5, WE 3-/5, triceps 0/5, grip 1/5, and finger abd 0/5 LE's 0/5 in HF, KE, DF, PF and EHL B/L  ASIA exam completed-  B/L shoulders/deltoids TTP- esp just past AC joints- less TTP over RTC/posterior shoulder- B/L shoulders TTP Also TTP/tight around cervical paraspinals and splenius captis and scalenes B/L   Skin:    General: Skin is warm and dry.     Comments: No skin breakdown seen; no LE edema  Neurological:     Comments: Patient is alert.  No acute distress.  He is somewhat hard of hearing.  Oriented x3. C5 intact to light touch; however decreased from C6 to S1 B/L - so checked every level in C5-T2; T3-T12 and L1-S1- also c/o decreased sensation on buttocks- like "asleep".   MAS of LUE and MAS of 1 in RUE- MAS normal/zero in LE's B/L   Psychiatric:     Comments: Smiled some, but obviously flat/somewhat depressed    Results for orders placed or performed during the hospital encounter of 12/07/20 (from the past 48 hour(s))  CBC     Status: Abnormal   Collection Time: 12/12/20  2:29 AM  Result Value Ref Range   WBC 8.1 4.0 - 10.5 K/uL   RBC 3.62 (L) 4.22 - 5.81 MIL/uL   Hemoglobin 11.5 (L) 13.0 - 17.0 g/dL   HCT 11.9 (L) 14.7 - 82.9 %   MCV 91.7 80.0 - 100.0 fL   MCH 31.8 26.0 - 34.0 pg   MCHC 34.6 30.0 - 36.0 g/dL   RDW 56.2 13.0 - 86.5 %   Platelets 90 (L) 150 - 400 K/uL    Comment: Immature Platelet Fraction may be clinically indicated, consider ordering this additional test [id number] CONSISTENT WITH PREVIOUS RESULT REPEATED TO VERIFY     nRBC 0.0 0.0 - 0.2 %    Comment: Performed at [facility], [address], [place], [place] [address]  Comprehensive metabolic panel     Status: Abnormal   Collection Time: 12/12/20  2:29 AM  Result Value Ref Range   Sodium 136 135 - 145 mmol/L   Potassium 3.9 3.5 - 5.1 mmol/L   Chloride 109 98 - 111 mmol/L   CO2 21 (L) 22 - 32 mmol/L   Glucose, Bld 108 (H) 70 - 99 mg/dL    Comment: Glucose reference range applies only to samples taken after fasting for at least 8 hours.   BUN 19  8 - 23 mg/dL   Creatinine, Ser 1.96 0.61 - 1.24 mg/dL   Calcium 7.7 (L) 8.9 - 10.3 mg/dL   Total Protein 4.6 (L) 6.5 - 8.1 g/dL   Albumin 2.3 (L) 3.5 - 5.0 g/dL   AST 32 15 - 41 U/L   ALT 24 0 - 44 U/L   Alkaline Phosphatase 70 38 - 126 U/L   Total Bilirubin 1.1 0.3 - 1.2 mg/dL   GFR, Estimated >22 >29 mL/min    Comment: (NOTE) Calculated using the CKD-EPI Creatinine Equation (2021)    Anion gap 6 5 - 15    Comment: Performed at [facility], [address], [place], [place] [address]  CBC     Status: Abnormal   Collection Time: 12/13/20 12:33 AM  Result Value Ref Range   WBC 7.4 4.0 - 10.5 K/uL   RBC 3.71 (L) 4.22 - 5.81 MIL/uL   Hemoglobin 11.8 (L) 13.0 - 17.0 g/dL   HCT 11.9 (L) 41.7 - 40.8 %   MCV 90.8 80.0 - 100.0 fL   MCH 31.8 26.0 - 34.0 pg   MCHC 35.0 30.0 - 36.0 g/dL   RDW 14.4 81.8 - 56.3 %   Platelets 116 (L) 150 - 400 K/uL    Comment: CONSISTENT WITH PREVIOUS RESULT REPEATED TO VERIFY    nRBC 0.0 0.0 - 0.2 %    Comment: Performed at [facility], [address], [place], [place] [address]  Comprehensive metabolic panel     Status: Abnormal   Collection Time: 12/13/20 12:33 AM  Result Value Ref Range   Sodium 137 135 - 145 mmol/L   Potassium 3.7 3.5 - 5.1 mmol/L   Chloride 111 98 - 111 mmol/L   CO2 20 (L) 22 - 32 mmol/L   Glucose, Bld 102 (H) 70 - 99 mg/dL    Comment: Glucose reference range applies only to samples taken after fasting for at least 8 hours.    BUN 17 8 - 23 mg/dL   Creatinine, Ser 2.63 0.61 - 1.24 mg/dL   Calcium 7.8 (L) 8.9 - 10.3 mg/dL   Total Protein 4.7 (L) 6.5 - 8.1 g/dL   Albumin 2.3 (L) 3.5 - 5.0 g/dL   AST 34 15 - 41 U/L   ALT 29 0 - 44 U/L   Alkaline Phosphatase 72 38 - 126 U/L   Total Bilirubin 1.0 0.3 - 1.2 mg/dL   GFR, Estimated >78 >58 mL/min    Comment: (NOTE) Calculated using the CKD-EPI Creatinine Equation (2021)    Anion gap 6 5 - 15    Comment: Performed at [facility], [address], [place], [place] [address]   NM Bone Scan Whole Body  Result Date: 12/11/2020 CLINICAL DATA:  Bone lesions on MRI. Concern for metastatic skeletal disease. EXAM: NUCLEAR MEDICINE WHOLE BODY BONE SCAN TECHNIQUE: Whole body anterior and posterior images were obtained approximately 3 hours after intravenous injection of radiopharmaceutical. RADIOPHARMACEUTICALS:  Twenty mCi Technetium-19m MDP IV COMPARISON:  Cervical MRI 12/08/2020 FINDINGS: There is a linear segment of abnormal radiotracer activity within the posterior RIGHT cervical paraspinal musculature which corresponds to abnormality within the paraspinal musculature on comparison MRI (image 27/21 on 12/07/20 and image 32/8 on MRI 12/08/2020). Activity at the level the thoracic inlet anteriorly may correlate the T1 lesion described on comparison MRIs. Elsewhere within the skeleton there is no foci of abnormal radiotracer activity. IMPRESSION: 1. Linear uptake within the paraspinal musculature pf RIGHT cervical spine  posteriorly corresponds to abnormal signal intensity on comparison MRI. 2. Focal uptake anterior to the thoracic inlet suggest correlation with the T1 vertebral body lesion. 3. Above findings may relate to acute trauma, infection, or neoplasm. 4. No additional evidence of metastatic skeletal disease. Electronically Signed   By: Genevive Bi M.D.   On: 12/11/2020 19:09       Medical Problem List and Plan: 1.  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  Status post plan loop recorder   -patient may  shower after 3 days- since getting loop recorder  -ELOS/Goals: 3-4 weeks- min-mod Assist 2.  Antithrombotics: -DVT/anticoagulation: Venous Doppler studies negative.  Continue Lovenox Pharmaceutical: Lovenox  -antiplatelet therapy: Aspirin 81 mg daily and Plavix 75 mg day x3 weeks and aspirin alone 3. Pain Management: Tylenol and will add Lidoderm patches 8pm to 8am -3 of them 4. Mood: Provide emotional support  -antipsychotic agents: N/A 5. Neuropsych: This patient is capable of making decisions on his own behalf. 6. Skin/Wound Care: Routine skin checks 7. Fluids/Electrolytes/Nutrition: Routine in and outs with follow-up chemistries 8.  Thrombocytopenia.  Follow-up with hematology services 9.  Neurogenic bowel bladder.  Foley catheter tube has been placed for multiple in and out catheterization- will wait til tomorrow to start bowel program- educated pt and wife/son on bowel program. Will add Lidocaine jelly for bowel program.  10.  Elevated PSA/enlarged prostate.  Family currently not committed to oncology work-up. 11. Depression and insomnia- will start Celexa 10 mg daily and melatonin 3 mg QHS prn.  12. Scrotal swelling- will elevate and Hospitalist is checking pro-BNP and giving a dose of Lasix.  Will monitor/follow.   Mcarthur Rossetti Angiulli, PA-C 12/13/2020   I have personally performed a face to face diagnostic evaluation of this patient and formulated the key components of the plan.  Additionally, I have personally reviewed laboratory data, imaging studies, as well as relevant notes and concur with the physician assistant's documentation above.   The patient's status has not  changed from the original H&P.  Any changes in documentation from the acute care chart have been noted above.

## 2020-12-13 NOTE — Consult Note (Signed)
ELECTROPHYSIOLOGY CONSULT NOTE  Patient ID: [name] MRN: [id number], DOB/AGE: 73-Apr-1947   Admit date: 12/07/2020 Date of Consult: 12/13/2020  Primary Physician: Oneita Hurt, No Primary Cardiologist: none Reason for Consultation: Cryptogenic stroke ; recommendations regarding Implantable Loop Recorder requested by Dr. Roda Shutters  History of Present Illness [name] was admitted on 12/07/2020 with acute onset of b/l shoulder pain with subsequent progressive b/l upper and lower extremities to quadriparesis.   A number of differentials though found to have acute stroke and felt quadriparesis 2/2 spinal coard infarct and suspect embolic event  PMHx includes: none previously  Neurology notes: ".it is almost certain that his symptoms are due to spinal cord infarct.  In combination with 2 small focus of bilateral brain infarcts, it is almost certain that he experienced embolic stroke involving brain and spinal cord"  he has undergone workup for stroke including echocardiogram and carotid dopplers and angio.  The patient has been monitored on telemetry which has demonstrated sinus rhythm with no arrhythmias.  Neurology has deferred TEE  Also note during his extensive work up into his paralysis there is suspect malignancy with lesions found on his spine, oncology mentions possible metastatic prostate cancer.  AT this time, further work up has been deferred by the family   Echocardiogram this admission demonstrated    IMPRESSIONS   1. Left ventricular ejection fraction, by estimation, is 60 to 65%. The  left ventricle has normal function. The left ventricle has no regional  wall motion abnormalities. Left ventricular diastolic parameters were  normal.   2. Right ventricular systolic function is normal. The right ventricular  size is normal. There is normal pulmonary artery systolic pressure. The  estimated right ventricular systolic pressure is 24.3 mmHg.   3. The mitral valve is normal in structure. No  evidence of mitral valve  regurgitation. No evidence of mitral stenosis.   4. The aortic valve is normal in structure. Aortic valve regurgitation is  trivial. No aortic stenosis is present.   5. The inferior vena cava is normal in size with greater than 50%  respiratory variability, suggesting right atrial pressure of 3 mmHg.   Conclusion(s)/Recommendation(s): No evidence of valvular vegetations on  this transthoracic echocardiogram. Consider a transesophageal  echocardiogram to exclude infective endocarditis if clinically indicated.  Lab work is reviewed.    Prior to admission, the patient denies chest pain, shortness of breath, dizziness, palpitations, or syncope.  Discharge plans to CIR today.   History reviewed. No pertinent past medical history.   Surgical History:  Past Surgical History:  Procedure Laterality Date   TONSILLECTOMY       Medications Prior to Admission  Medication Sig Dispense Refill Last Dose   acetaminophen (TYLENOL) 500 MG tablet Take 1,000 mg by mouth every 6 (six) hours as needed (pain).   12/07/2020 at am   Ascorbic Acid (VITAMIN C PO) Take 1 tablet by mouth daily.   Past Week   Cholecalciferol (VITAMIN D3 PO) Take 1 tablet by mouth daily.   Past Week   Cyanocobalamin (VITAMIN B-12 PO) Take 1 drop by mouth daily.   Past Week   Multiple Vitamin (MULTIVITAMIN WITH MINERALS) TABS tablet Take 1 tablet by mouth daily.   Past Week    Inpatient Medications:   aspirin EC  81 mg Oral Daily   atorvastatin  80 mg Oral Daily   Chlorhexidine Gluconate Cloth  6 each Topical Daily   clopidogrel  75 mg Oral Daily   enoxaparin (LOVENOX) injection  40 mg Subcutaneous Q24H   pantoprazole  40 mg Oral Daily    Allergies: No Known Allergies  Social History   Socioeconomic History   Marital status: Married    Spouse name: Not on file   Number of children: 2   Years of education: Not on file   Highest education level: Not on file  Occupational History    Occupation: retired  Tobacco Use   Smoking status: Never   Smokeless tobacco: Never  Vaping Use   Vaping Use: Never used  Substance and Sexual Activity   Alcohol use: Not Currently   Drug use: Never   Sexual activity: Never  Other Topics Concern   Not on file  Social History Narrative   Not on file   Social Determinants of Health   Financial Resource Strain: Not on file  Food Insecurity: Not on file  Transportation Needs: Not on file  Physical Activity: Not on file  Stress: Not on file  Social Connections: Not on file  Intimate Partner Violence: Not on file     Family History  Problem Relation Age of Onset   Cancer Mother    Dementia Mother    Heart attack Father    Clotting disorder Father       Review of Systems: All other systems reviewed and are otherwise negative except as noted above.  Physical Exam: Vitals:   12/12/20 2128 12/12/20 2343 12/13/20 0400 12/13/20 0808  BP: (!) 168/78 (!) 165/79 (!) 157/81 (!) 145/79  Pulse: 74 67 70 72  Resp: Temp: 98.6 F (37 C) 98.1 F (36.7 C) 97.9 F (36.6 C)   TempSrc: Oral Oral Oral Oral  SpO2: 95% 95% 94% 94%  Weight:      Height:        GEN- The patient is well appearing, alert and oriented x 3 today.   Head- normocephalic, atraumatic Eyes-  Sclera clear, conjunctiva pink Ears- hearing intact Oropharynx- clear Neck- supple Lungs- CTA b/l normal work of breathing Heart-  RRR, no murmurs, rubs or gallops  GI- soft, NT, ND Extremities- no clubbing, cyanosis, or edema MS- no significant deformity or atrophy Skin- no rash or lesion Psych- euthymic mood, full affect   Labs:   Lab Results  Component Value Date   WBC 7.4 12/13/2020   HGB 11.8 (L) 12/13/2020   HCT 33.7 (L) 12/13/2020   MCV 90.8 12/13/2020   PLT 116 (L) 12/13/2020    Recent Labs  Lab 12/13/20 0033  NA 137  K 3.7  CL 111  CO2 20*  BUN 17  CREATININE 0.76  CALCIUM 7.8*  PROT 4.7*  BILITOT 1.0  ALKPHOS 72  ALT 29   AST 34  GLUCOSE 102*   Lab Results  Component Value Date   CKTOTAL 82 12/07/2020   Lab Results  Component Value Date   CHOL 231 (H) 12/08/2020   Lab Results  Component Value Date   HDL 38 (L) 12/08/2020   Lab Results  Component Value Date   LDLCALC 180 (H) 12/08/2020   Lab Results  Component Value Date   TRIG 66 12/08/2020   Lab Results  Component Value Date   CHOLHDL 6.1 12/08/2020   No results found for: LDLDIRECT  Lab Results  Component Value Date   DDIMER >20.00 (H) 12/08/2020     Radiology/Studies:  CT ANGIO HEAD NECK W WO CM Result Date: 12/08/2020 CLINICAL DATA:  Stroke, follow-up. Acute quadriparesis. Small acute left perirolandic  infarct on head MRI and suspected cervical and upper thoracic spinal cord infarct on spine MRI. EXAM: CT ANGIOGRAPHY HEAD AND NECK TECHNIQUE: Multidetector CT imaging of the head and neck was performed using the standard protocol during bolus administration of intravenous contrast. Multiplanar CT image reconstructions and MIPs were obtained to evaluate the vascular anatomy. Carotid stenosis measurements (when applicable) are obtained utilizing NASCET criteria, using the distal internal carotid diameter as the denominator. CONTRAST:  75mL OMNIPAQUE IOHEXOL 350 MG/ML SOLN COMPARISON:  MRIs of the head, cervical spine, and thoracic spine from earlier today and yesterday FINDINGS: CT HEAD FINDINGS Brain: The subcentimeter acute left perirolandic infarct was better demonstrated on MRI. No new infarct, acute intracranial hemorrhage, mass, midline shift, or extra-axial fluid collection is identified. The ventricles and sulci are within normal limits for age. Hypodensities in the cerebral white matter bilaterally are nonspecific but compatible with mild chronic small vessel ischemic disease. Vascular: Calcified atherosclerosis at the skull base. Skull: No fracture or suspicious osseous lesion. Sinuses: Mild mucosal thickening in the paranasal sinuses.  Clear mastoid air cells. Orbits: Unremarkable. Review of the MIP images confirms the above findings CTA NECK FINDINGS Aortic arch: Normal variant aortic arch branching pattern with common origin of the brachiocephalic and left common carotid arteries and with the left vertebral artery arising directly from the arch. Widely patent arch vessel origins. Right carotid system: Patent without evidence of stenosis, dissection, or significant atherosclerosis. Left carotid system: Patent without evidence of stenosis, dissection, or significant atherosclerosis. Vertebral arteries: Patent and codominant without evidence of stenosis, dissection, or significant atherosclerosis. Skeleton: Lucency in the T1 vertebral body with posterior cortical erosion and focal superior endplate fracture as seen on MRI. Other neck: No evidence of cervical lymphadenopathy or mass. Upper chest: Clear lung apices. Review of the MIP images confirms the above findings CTA HEAD FINDINGS Anterior circulation: The internal carotid arteries are patent from skull base to carotid termini with minimal nonstenotic plaque. ACAs and MCAs are patent without evidence of a proximal branch occlusion or significant proximal stenosis. 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 No aneurysm is identified. Posterior circulation: The intracranial vertebral arteries are widely patent to the basilar. Patent left PICA, right AICA, and bilateral SCA origins are visualized. The basilar artery is widely patent. There are moderately large right and diminutive left posterior communicating arteries with hypoplasia of the right P1 segment, and there are also large anterior choroidal arteries which supply a portion of the PCA territory bilaterally. No significant proximal PCA stenosis is evident. No aneurysm is identified. Venous sinuses: Patent. Anatomic variants: As above. Review of the MIP images confirms the above findings IMPRESSION: 1. Minimal atherosclerosis in the head and neck  without large vessel occlusion or significant proximal stenosis. 2. Lucency in the T1 vertebral body with posterior cortical erosion and superior endplate fracture as seen on MRI. This remains more concerning for neoplasm (metastatic disease, lymphoma, myeloma) as opposed to a benign bone infarct. Electronically Signed   By: Sebastian Ache M.D.   On: 12/08/2020 11:24    CT HEAD WO CONTRAST Result Date: 12/07/2020 CLINICAL DATA:  Mental status change, unknown cause. Altered mental status. EXAM: CT HEAD WITHOUT CONTRAST TECHNIQUE: Contiguous axial images were obtained from the base of the skull through the vertex without intravenous contrast. COMPARISON:  No pertinent prior exams available for comparison. FINDINGS: Brain: Cerebral volume is normal. There is no acute intracranial hemorrhage. No demarcated cortical infarct. No extra-axial fluid collection. No evidence of an intracranial mass. No  midline shift. Vascular: No hyperdense vessel.  Atherosclerotic calcifications Skull: No calvarial fracture or focal suspicious osseous lesion. Hyperostosis frontalis interna, predominantly on the left. Sinuses/Orbits: Visualized orbits show no acute finding. Mild bilateral ethmoid sinus mucosal thickening. Trace mucosal thickening within the bilateral sphenoid sinuses. Small bilateral maxillary sinus mucous retention cysts. IMPRESSION: No evidence of acute intracranial abnormality. Mild paranasal sinus disease, as described. Electronically Signed   By: Jackey LogeKyle  Golden DO   On: 12/07/2020 13:05    MR Brain W and Wo Contrast Result Date: 12/07/2020 CLINICAL DATA:  Neuro deficit, acute, stroke suspected.  Weakness. EXAM: MRI HEAD WITHOUT AND WITH CONTRAST TECHNIQUE: Multiplanar, multiecho pulse sequences of the brain and surrounding structures were obtained without and with intravenous contrast. CONTRAST:  7mL GADAVIST GADOBUTROL 1 MMOL/ML IV SOLN COMPARISON:  Head CTA 522 FINDINGS: Brain: There is a 6 mm acute  cortical/subcortical infarct in the left perirolandic region. Scattered small T2 hyperintensities in the cerebral white matter bilaterally are nonspecific but compatible with mild chronic small vessel ischemic disease. The ventricles and sulci are within normal limits for age. No intracranial hemorrhage, mass, midline shift, extra-axial fluid collection, or abnormal enhancement is identified. Vascular: Major intracranial vascular flow voids are preserved. Skull and upper cervical spine: Unremarkable bone marrow signal. Asymmetric left frontal hyperostosis interna. Sinuses/Orbits: Unremarkable orbits. Unremarkable orbits. Mild bilateral ethmoid air cell mucosal thickening. Other: None. IMPRESSION: 1. 6 mm acute left perirolandic infarct. 2. Mild chronic small vessel ischemic disease. Electronically Signed   By: Sebastian AcheAllen  Grady M.D.   On: 12/07/2020 18:14     MR CERVICAL SPINE WO CONTRAST Result Date: 12/08/2020 CLINICAL DATA:  Possible cord infarct. EXAM: MRI CERVICAL AND THORACIC SPINE WITHOUT CONTRAST TECHNIQUE: Multiplanar and multiecho pulse sequences of the cervical spine, to include the craniocervical junction and cervicothoracic junction, and the thoracic spine, were obtained without intravenous contrast. Diffusion-weighted imaging was performed. COMPARISON:  MRI cervical spine 12/07/2020 FINDINGS: MRI CERVICAL SPINE FINDINGS Findings of the cervical spine, including the long segment central cord signal abnormality, are unchanged from the earlier cervical spine MRI. Additionally, diffusion-weighted imaging was performed however, the images are of limited diagnostic value. MRI THORACIC SPINE FINDINGS Alignment:  Physiologic. Vertebrae: Unchanged possible pathologic fracture at T1. Cord: Central cord signal abnormality terminates at the T1-2 level. The remainder of the thoracic spinal cord is normal. Paraspinal and other soft tissues: Negative. Disc levels: No spinal canal or neural foraminal stenosis.  Nondiagnostic diffusion-weighted imaging. IMPRESSION: 1. Nondiagnostic attempted diffusion-weighted imaging of the cervical and thoracic spine. Utilization of a more tailored spine MRI protocol may be helpful if this can be made available. 2. Unchanged long segment central cervical spinal cord signal abnormality, which terminates at the T1-2 level. No signal abnormality within the remainder of the thoracic spinal cord. 3. Unchanged appearance of possible pathologic fracture at T1. Electronically Signed   By: Deatra RobinsonKevin  Herman M.D.   On: 12/08/2020 02:35      MR Cervical Spine W or Wo Contrast Result Date: 12/07/2020 CLINICAL DATA:  Demyelinating disease.  Weakness. EXAM: MRI CERVICAL SPINE WITHOUT AND WITH CONTRAST TECHNIQUE: Multiplanar and multiecho pulse sequences of the cervical spine, to include the craniocervical junction and cervicothoracic junction, were obtained without and with intravenous contrast. CONTRAST:  7mL GADAVIST GADOBUTROL 1 MMOL/ML IV SOLN COMPARISON:  None. FINDINGS: The study is motion degraded including severe motion on the axial T2 gradient echo sequence. Alignment: Reversal of the normal cervical lordosis. Trace anterolisthesis of C4 on C5 and C7 on T1.  Vertebrae: Abnormal marked T1 hypointensity, STIR hyperintensity, and mild enhancement involving the marrow throughout the majority of the T1 vertebral body and extending into the right-sided posterior elements with an associated fracture involving the superior endplate and posterior vertebral body cortex resulting in focal 40% vertebral body height loss and 3 mm retropulsion of the posterior vertebral body cortex. 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 Abnormal signal hypointensity posteriorly in the C6 and C7 vertebral bodies on the sagittal T1 postcontrast sequence without a corresponding abnormality on the other sequences to confirm an underlying lesion. Degenerative endplate changes at C5-6  and C6-7. Cord: Mild T2 hyperintensity involving the central and ventral spinal cord bilaterally from C5 into the included upper thoracic spine without enhancement. Posterior Fossa, vertebral arteries, paraspinal tissues: Unremarkable. Disc levels: C2-3: Severe right facet arthrosis result in mild-to-moderate right neural foraminal stenosis without spinal stenosis. C3-4: Moderate facet arthrosis results in mild right neural foraminal stenosis without spinal stenosis. C4-5: Anterolisthesis with bulging uncovered disc, uncovertebral spurring, and moderate right facet arthrosis result in mild spinal stenosis and moderate right neural foraminal stenosis. C5-6: Mild-to-moderate disc space narrowing. Disc bulging and uncovertebral spurring result in moderate right and severe left neural foraminal stenosis without significant spinal stenosis. C6-7: Mild-to-moderate disc space narrowing. Disc bulging, a small central disc protrusion, and uncovertebral spurring result in borderline spinal stenosis and severe right and moderate to severe left neural foraminal stenosis. C7-T1: Anterolisthesis with disc uncovering, infolding of the ligamentum flavum, and severe facet arthrosis result in mild-to-moderate spinal stenosis and severe bilateral neural foraminal stenosis. IMPRESSION: 1. Lesion involving the T1 vertebral body and right-sided posterior elements with associated pathologic vertebral fracture concerning for metastatic disease or other neoplasm. 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 2. Abnormal T2 hyperintensity involving the central and ventral aspects of the spinal cord from C5 into the included upper thoracic spine. Considerations include cord infarct, transverse myelitis, viral myelitis, and other inflammatory processes such as sarcoidosis. No enhancement or masslike finding to suggest intramedullary cord tumor although thoracic spine imaging is recommended for further evaluation. 3. Disc and facet degeneration  resulting in severe multilevel neural foraminal stenosis as above. 4. Mild-to-moderate spinal stenosis at C7-T1. Electronically Signed   By: Sebastian Ache M.D.   On: 12/07/2020 18:45    NM Bone Scan Whole Body Result Date: 12/11/2020 CLINICAL DATA:  Bone lesions on MRI. Concern for metastatic skeletal disease. EXAM: NUCLEAR MEDICINE WHOLE BODY BONE SCAN TECHNIQUE: Whole body anterior and posterior images were obtained approximately 3 hours after intravenous injection of radiopharmaceutical. RADIOPHARMACEUTICALS:  Twenty mCi Technetium-44m MDP IV COMPARISON:  Cervical MRI 12/08/2020 FINDINGS: There is a linear segment of abnormal radiotracer activity within the posterior RIGHT cervical paraspinal musculature which corresponds to abnormality within the paraspinal musculature on comparison MRI (image 27/21 on 12/07/20 and image 32/8 on MRI 12/08/2020). Activity at the level the thoracic inlet anteriorly may correlate the T1 lesion described on comparison MRIs. Elsewhere within the skeleton there is no foci of abnormal radiotracer activity. IMPRESSION: 1. Linear uptake within the paraspinal musculature pf RIGHT cervical spine posteriorly corresponds to abnormal signal intensity on comparison MRI. 2. Focal uptake anterior to the thoracic inlet suggest correlation with the T1 vertebral body lesion. 3. Above findings may relate to acute trauma, infection, or neoplasm. 4. No additional evidence of metastatic skeletal disease. Electronically Signed   By: Genevive Bi M.D.   On: 12/11/2020 19:09    CT CHEST ABDOMEN PELVIS  W CONTRAST Result Date: 12/08/2020 CLINICAL DATA:  Thoracic spine osseous lesion. Assessment for potential primary malignancy EXAM: CT CHEST, ABDOMEN, AND PELVIS WITH CONTRAST TECHNIQUE: Multidetector CT imaging of the chest, abdomen and pelvis was performed following the standard protocol during bolus administration of intravenous contrast. CONTRAST:  OMNIPAQUE IOHEXOL 300 MG/ML  SOLN  COMPARISON:  None. FINDINGS: CT CHEST FINDINGS Cardiovascular: No significant vascular findings. Normal heart size. No pericardial effusion. Mediastinum/Nodes: No enlarged mediastinal, hilar, or axillary lymph nodes. Thyroid gland, trachea, and esophagus demonstrate no significant findings. Lungs/Pleura: Lungs are clear. No pleural effusion or pneumothorax. Musculoskeletal: Lucent lesion of T1 height and small superior endplate defect. No other focal osseous lesion. CT ABDOMEN PELVIS FINDINGS Hepatobiliary: No focal liver abnormality is seen. No gallstones, gallbladder wall thickening, or biliary dilatation. Pancreas: Unremarkable. No pancreatic ductal dilatation or surrounding inflammatory changes. Spleen: Normal in size without focal abnormality. Adrenals/Urinary Tract: Normal adrenal glands. Bilateral mild perinephric stranding. Exophytic left renal cyst measuring 16 mm. Stomach/Bowel: Stomach is within normal limits. Appendix appears normal. No evidence of bowel wall thickening, distention, or inflammatory changes. Vascular/Lymphatic: Aortic atherosclerosis. No enlarged abdominal or pelvic lymph nodes. Reproductive: Enlarged prostate measuring 5.8 cm transverse dimension. Other: No abdominal wall hernia or abnormality. No abdominopelvic ascites. Musculoskeletal: No acute or significant osseous findings. IMPRESSION: 1. No primary malignancy identified in the chest, abdomen or pelvis. 2. Lucent lesion of T1 height and small superior endplate defect, nonspecific. No other focal osseous lesions. 3. Bilateral mild perinephric stranding, nonspecific. 4. Enlarged prostate measuring 5.8 cm transverse dimension. Aortic Atherosclerosis (ICD10-I70.0). Electronically Signed   By: Deatra Robinson M.D.   On: 12/08/2020 03:21     VAS US CAROTID Result Date: 12/08/2020 Carotid Arterial Duplex Study Patient Name:  AJAMU MAXON  Date of Exam:   12/08/2020 Medical Rec #: [id number]     Accession #:    [id number] Date of Birth:  [birth year]     Patient Gender: M Patient Age:   89 years Exam Location:  [facility] Procedure:      VAS US CAROTID Referring Phys: Lyn Hollingshead MELVIN --------------------------------------------------------------------------------  Indications:       CVA. Other Factors:     No known past medical history. Comparison Study:  No previous exams Performing Technologist: Jody Hill RVT, RDMS  Examination Guidelines: A complete evaluation includes B-mode imaging, spectral Doppler, color Doppler, and power Doppler as needed of all accessible portions of each vessel. Bilateral testing is considered an integral part of a complete examination. Limited examinations for reoccurring indications may be performed as noted.  Summary: Right Carotid: The extracranial vessels were near-normal with only minimal wall                thickening or plaque. Left Carotid: The extracranial vessels were near-normal with only minimal wall               thickening or plaque. Vertebrals:  Bilateral vertebral arteries demonstrate antegrade flow. Subclavians: Normal flow hemodynamics were seen in bilateral subclavian              arteries. *See table(s) above for measurements and observations.     Preliminary     VAS [place] LOWER EXTREMITY VENOUS (DVT) Result Date: 12/09/2020  Lower Venous DVT Study Patient Name:  MALIKAI GUT  Date of Exam:   12/08/2020 Medical Rec #: [id number]     Accession #:    [id number] Date of Birth: 1945/12/05     Patient Gender: M Patient Age:  75 years Exam Location:  [facility] Procedure:      VAS [place] LOWER EXTREMITY VENOUS (DVT) Referring Phys: Scheryl Marten XU  Indications: Stroke.  Limitations: Positioning and poor ultrasound/tissue interface. Comparison Study: No prior study on file Performing Technologist: Sherren Kerns RVS  Examination Guidelines: A complete evaluation includes B-mode imaging, spectral Doppler, color Doppler, and power Doppler as needed of all accessible portions of each vessel. Bilateral  testing is considered an integral part of a complete examination. Limited examinations for reoccurring indications may be performed as noted. The reflux portion of the exam is performed with the patient in reverse Trendelenburg.  - Summary: BILATERAL: - No evidence of deep vein thrombosis seen in the lower extremities, bilaterally. -No evidence of popliteal cyst, bilaterally.   *See table(s) above for measurements and observations. Electronically signed by Heath Lark on 12/09/2020 at 2:01:50 PM.    Final     12-lead ECG SR All prior EKG's in EPIC reviewed with no documented atrial fibrillation  Telemetry SR  Assessment and Plan:  1. Cryptogenic stroke The patient presents with cryptogenic stroke.  Dr. Lalla Brothers spoke at length with the patient about monitoring for afib with either a 30 day event monitor or an implantable loop recorder.  Risks, benefits, and alteratives to implantable loop recorder were discussed with the patient/wife today.   At this time, the patient is very clear in his decision to proceed with implantable loop recorder.   Wound care was reviewed with the patient (keep incision clean and dry for 3 days).  Wound check follow up will be scheduled for the patient.  Please call with questions.   Solyana Nonaka Norberto Sorenson, PA-C 12/13/2020

## 2020-12-13 NOTE — Progress Notes (Signed)
Patient ID: [name], male   DOB: December 03, 1945, 75 y.o.   MRN: [id number] INPATIENT REHABILITATION ADMISSION NOTE   Arrival Method: Bed      Mental Orientation: A&O x4    Assessment: done    Skin: intact with exception of loop recorder site    IV'S: one ive in left hand    Pain: none    Tubes and Drains: none    Safety Measures: side rails upx 4    Vital Signs: done    Height and Weight: 5'9" 72.8kg    Rehab Orientation: done    Family: not at bedside     Notes: pt code status in question please clarify this in the am

## 2020-12-13 NOTE — Progress Notes (Signed)
Physical Therapy Treatment Patient Details Name: [name] MRN: [id number] DOB: 07-Sep-1945 Today's Date: 12/13/2020    History of Present Illness 75 yo male with onset of gradually increasing weakness was brought to ED.  Pt has history of being able to do yardwork the day before arrival but then global weakness occurred.  Pt reports tingling pain on R posterior neck and upper thoracic spine.  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 if this may be due to metastatic prostate CA - oncology recommends bx and bone scan.  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   Differential dx now is between myelitis and spinal stroke.  PMHx:  atherosclerosis, spinal stenosis C7-T1,    PT Comments    Patient received in bed, much more fatigued than his typical- reports that he was just not able to rest well last night/very fatigued today. Discussed overall progress with therapy as well as in general what to expect in terms of progress and what he will likely be working on with therapists in [place]. Performed SCI ACE wrapping and performed multiple rolls back and forth, unfortunately unable to get to EOB today as every time we moved he had multiple liquid BMs. Left on bedpan with NT/RN present and all needs otherwise met. Continue to recommend CIR.   Follow Up Recommendations  CIR     Equipment Recommendations  Hospital bed;Wheelchair (measurements PT);Wheelchair cushion (measurements PT);3in1 (PT);Other (comment) (hoyer lift and pads)    Recommendations for Other Services       Precautions / Restrictions Precautions Precautions: Back Precaution Booklet Issued: No Precaution Comments: pathologic thoracic fracture- back precautions for safety Restrictions Weight Bearing Restrictions: No Other Position/Activity Restrictions: protection of spine    Mobility  Bed Mobility Overal bed mobility: Needs Assistance Bed Mobility:  Rolling Rolling: +2 for physical assistance;Total assist         General bed mobility comments: totalAx2 for rolling- unfortunatley unable to get to EOB today as every time we moved he had a burst of liquid bowel movement, had to leave him on bedpan with nursing staff present    Transfers                 General transfer comment: deferred- active liquid BMs  Ambulation/Gait             General Gait Details: unable   Stairs             Wheelchair Mobility    Modified Rankin (Stroke Patients Only)       Balance                                            Cognition Arousal/Alertness: Awake/alert Behavior During Therapy: WFL for tasks assessed/performed;Flat affect Overall Cognitive Status: Within Functional Limits for tasks assessed                                 General Comments: polite and cooperative with therapy- engaged, but just more fatigued today and tells me he had a rough night/wasn't able to get much sleep. Very motivated to improve      Exercises      General Comments General comments (skin integrity, edema, etc.): unable to get  to EOB today- active liquid BMs      Pertinent Vitals/Pain Pain Assessment: Faces Faces Pain Scale: Hurts little more Pain Location: R posterior neck and upper back, B shoulders Pain Descriptors / Indicators: Sore;Tingling;Grimacing Pain Intervention(s): Limited activity within patient's tolerance;Monitored during session    Home Living                      Prior Function            PT Goals (current goals can now be found in the care plan section) Acute Rehab PT Goals Patient Stated Goal: to be able to move; to eat wife's [place] cooking. PT Goal Formulation: With patient Time For Goal Achievement: 12/22/20 Potential to Achieve Goals: Fair Progress towards PT goals: Progressing toward goals (but limited by liquid BMs today)    Frequency    Min  4X/week      PT Plan Current plan remains appropriate    Co-evaluation              AM-PAC PT "6 Clicks" Mobility   Outcome Measure  Help needed turning from your back to your side while in a flat bed without using bedrails?: Total Help needed moving from lying on your back to sitting on the side of a flat bed without using bedrails?: Total Help needed moving to and from a bed to a chair (including a wheelchair)?: Total Help needed standing up from a chair using your arms (e.g., wheelchair or bedside chair)?: Total Help needed to walk in hospital room?: Total Help needed climbing 3-5 steps with a railing? : Total 6 Click Score: 6    End of Session   Activity Tolerance: Patient tolerated treatment well Patient left: in bed;with call bell/phone within reach;Other (comment) (NT/RN present and attending) Nurse Communication: Mobility status PT Visit Diagnosis: Muscle weakness (generalized) (M62.81);Pain;Other abnormalities of gait and mobility (R26.89) Pain - Right/Left: Right Pain - part of body:  (neck and upper back)     Time: 0938-1829 PT Time Calculation (min) (ACUTE ONLY): 42 min  Charges:  $Therapeutic Activity: 23-37 mins $Self Care/Home Management: 8-22                    Windell Norfolk, DPT, PN2   Supplemental Physical Therapist [place]    Pager [phone] Acute Rehab Office [phone]

## 2020-12-14 ENCOUNTER — Inpatient Hospital Stay (HOSPITAL_COMMUNITY): Payer: Medicare Other

## 2020-12-14 ENCOUNTER — Encounter (HOSPITAL_COMMUNITY): Payer: Self-pay | Admitting: Cardiology

## 2020-12-14 DIAGNOSIS — R1310 Dysphagia, unspecified: Secondary | ICD-10-CM

## 2020-12-14 DIAGNOSIS — G8250 Quadriplegia, unspecified: Secondary | ICD-10-CM | POA: Diagnosis not present

## 2020-12-14 LAB — COMPREHENSIVE METABOLIC PANEL
ALT: 26 U/L (ref 0–44)
AST: 22 U/L (ref 15–41)
Albumin: 2.2 g/dL — ABNORMAL LOW (ref 3.5–5.0)
Alkaline Phosphatase: 65 U/L (ref 38–126)
Anion gap: 8 (ref 5–15)
BUN: 18 mg/dL (ref 8–23)
CO2: 21 mmol/L — ABNORMAL LOW (ref 22–32)
Calcium: 8 mg/dL — ABNORMAL LOW (ref 8.9–10.3)
Chloride: 106 mmol/L (ref 98–111)
Creatinine, Ser: 0.81 mg/dL (ref 0.61–1.24)
GFR, Estimated: >60 mL/min (ref >60)
Glucose, Bld: 96 mg/dL (ref 70–99)
Potassium: 3.7 mmol/L (ref 3.5–5.1)
Sodium: 135 mmol/L (ref 135–145)
Total Bilirubin: 1.3 mg/dL — ABNORMAL HIGH (ref 0.3–1.2)
Total Protein: 4.6 g/dL — ABNORMAL LOW (ref 6.5–8.1)

## 2020-12-14 LAB — URINALYSIS, ROUTINE W REFLEX MICROSCOPIC
Bilirubin Urine: NEGATIVE
Glucose, UA: NEGATIVE mg/dL
Ketones, ur: NEGATIVE mg/dL
Nitrite: NEGATIVE
Protein, ur: NEGATIVE mg/dL
Specific Gravity, Urine: 1.006 (ref 1.005–1.030)
pH: 5 (ref 5.0–8.0)

## 2020-12-14 LAB — CBC WITH DIFFERENTIAL/PLATELET
Abs Immature Granulocytes: 0.08 10*3/uL — ABNORMAL HIGH (ref 0.00–0.07)
Basophils Absolute: 0 10*3/uL (ref 0.0–0.1)
Basophils Relative: 0 %
Eosinophils Absolute: 0.1 10*3/uL (ref 0.0–0.5)
Eosinophils Relative: 1 %
HCT: 33.9 % — ABNORMAL LOW (ref 39.0–52.0)
Hemoglobin: 12.4 g/dL — ABNORMAL LOW (ref 13.0–17.0)
Immature Granulocytes: 1 %
Lymphocytes Relative: 6 %
Lymphs Abs: 0.7 10*3/uL (ref 0.7–4.0)
MCH: 32.5 pg (ref 26.0–34.0)
MCHC: 36.6 g/dL — ABNORMAL HIGH (ref 30.0–36.0)
MCV: 88.7 fL (ref 80.0–100.0)
Monocytes Absolute: 1.1 10*3/uL — ABNORMAL HIGH (ref 0.1–1.0)
Monocytes Relative: 10 %
Neutro Abs: 9.3 10*3/uL — ABNORMAL HIGH (ref 1.7–7.7)
Neutrophils Relative %: 82 %
Platelets: 152 10*3/uL (ref 150–400)
RBC: 3.82 MIL/uL — ABNORMAL LOW (ref 4.22–5.81)
RDW: 12.5 % (ref 11.5–15.5)
WBC: 11.2 10*3/uL — ABNORMAL HIGH (ref 4.0–10.5)
nRBC: 0 % (ref 0.0–0.2)

## 2020-12-14 IMAGING — DX DG CHEST 1V PORT
1 series · 2 of 2 positions shown · non-contrast
Comparison: [DATE]

CLINICAL DATA: Leukocytosis

EXAM:
PORTABLE CHEST 1 VIEW

[Series 1: chest · 0.14mm/px · 2 of 2 slices shown]
[im 1/2]
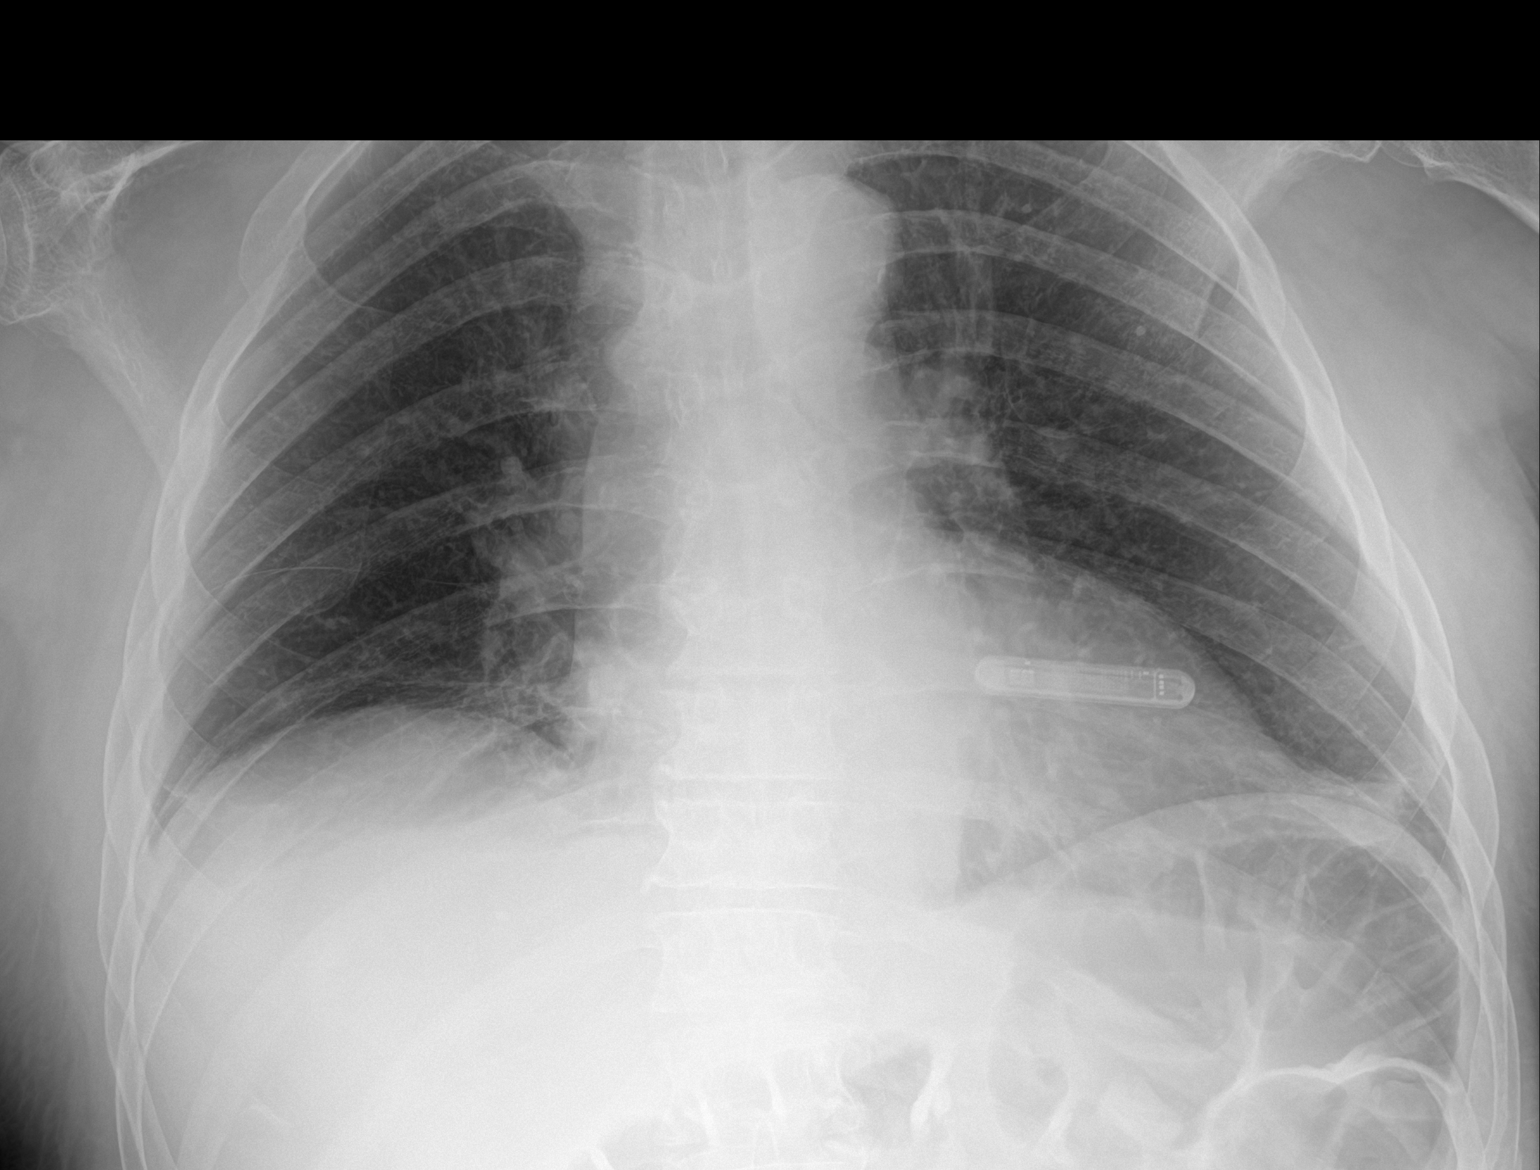
[im 2/2]
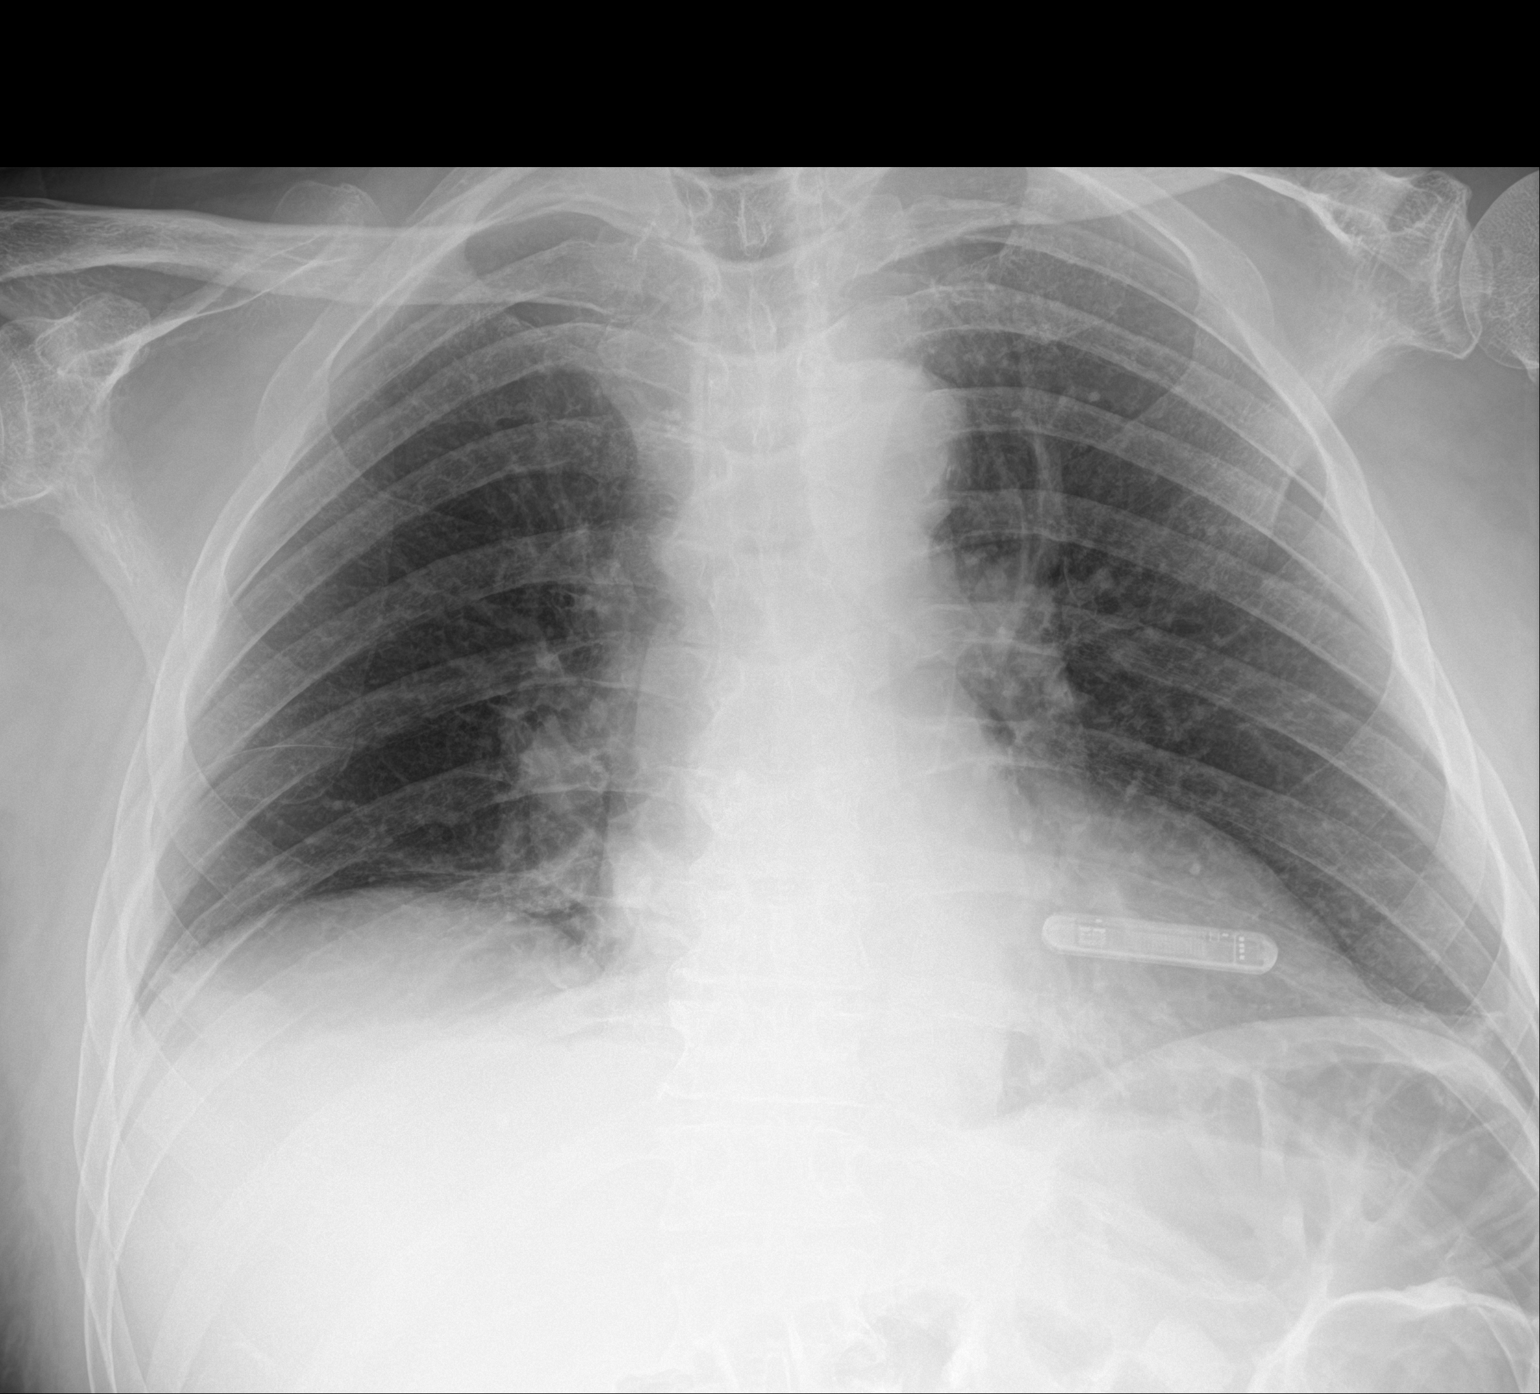

[2 of 2 positions shown; findings below may reference images not displayed]

FINDINGS: Loop recorder device overlies the left chest wall. Heart size within
normal limits. Atherosclerotic calcification of the aortic knob. Low
lung volumes. Band-like opacities within the lung bases suggest
atelectasis. Lungs appear otherwise clear. No pleural effusion or
pneumothorax.
IMPRESSION: Low lung volumes with bibasilar atelectasis.

## 2020-12-14 MED ORDER — CHLORHEXIDINE GLUCONATE CLOTH 2 % EX PADS
6.0000 | MEDICATED_PAD | Freq: Every day | CUTANEOUS | Status: DC
  Administered 2020-12-14 – 2020-12-15 (×2): 6 via TOPICAL

## 2020-12-14 MED ORDER — BACLOFEN 5 MG HALF TABLET
5.0000 mg | ORAL_TABLET | Freq: Three times a day (TID) | ORAL | Status: DC
  Administered 2020-12-14 – 2021-01-10 (×82): 5 mg via ORAL
  Filled 2020-12-14 (×82): qty 1

## 2020-12-14 NOTE — Evaluation (Signed)
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 No pertinent past medical history. Past Surgical History:  Past Surgical History:  Procedure Laterality Date   LOOP RECORDER INSERTION N/A 12/13/2020   Procedure: LOOP RECORDER INSERTION;  Surgeon: [name], MD;  Location: [facility];  Service: Cardiovascular;  Laterality: N/A;   TONSILLECTOMY      Assessment & Plan Clinical Impression: Patient is a 75 y.o. year old male with right-handed male unremarkable past medical history except he did have a history of spinal stenosis C7-T1 not requiring surgical intervention and on no prescription medications.  Per chart review patient lives with spouse.  Independent prior to admission.  Presented 12/07/2020 with onset of progressive diffuse weakness pain across his neck/shoulders and inability to walk.  Urine drug screen was negative.  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  Neurology consulted Dr. 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  Dr.Xu felt that stroke to brain/spine likely cardioembolic in nature and recommended DAPT as well as loop recorder to rule out atrial fibrillation .  Carotid Dopplers negative for ICA stenosis.  Bilateral extremity Dopplers negative.  Echocardiogram with ejection fraction of 60 to 65% no wall motion abnormalities.  Dr. Alvy Bimler of hematology service consulted for input on pancytopenia question metastatic prostate cancer due to work-up revealing enlarged prostate and elevated PSA.  Bone scan with bone biopsy recommended for work-up of pancytopenia.  Family not interested in aggressive cancer work-up if patient did not have the ability to gain prior level of independence.  He was cleared to begin Lovenox for DVT prophylaxis.  He was placed on aspirin Plavix for CVA prophylaxis x3 weeks total then aspirin alone.  Therapy evaluations completed due to patient's quadriparesis was admitted for a comprehensive rehab program. Patient transferred to CIR on 12/13/2020 .    Patient currently requires total with basic self-care skills secondary to muscle paralysis, impaired timing and sequencing, abnormal tone, and unbalanced muscle activation, and decreased sitting balance, decreased standing balance, and quadriparesis level C4-T1 .  Prior to hospitalization, patient could complete ADLs with independent .  Patient will benefit from skilled intervention to decrease level of assist with basic self-care skills prior to discharge home with care partner.  Anticipate patient will require 24 hour supervision and moderate physical assestance and follow up home health.  OT - End of Session Activity Tolerance: Tolerates 10 - 20 min activity with multiple rests Endurance Deficit: Yes Endurance  Deficit  Description: requires multiple rest breaks with changes in position OT Assessment Rehab Potential (ACUTE ONLY): Fair OT Barriers to Discharge: Incontinence;Neurogenic Bowel & Bladder;Decreased caregiver support OT Patient demonstrates impairments in the following area(s): Balance;Endurance;Motor;Pain;Perception;Safety;Sensory;Skin Integrity OT Basic ADL's Functional Problem(s): Grooming;Bathing;Eating;Dressing;Toileting OT Transfers Functional Problem(s): Toilet OT Additional Impairment(s): Fuctional Use of Upper Extremity OT Plan OT Intensity: Minimum of 1-2 x/day, 45 to 90 minutes OT Frequency: 5 out of 7 days OT Duration/Estimated Length of Stay: 24-28 days OT Treatment/Interventions: Balance/vestibular training;Discharge planning;Disease Lawyer;Functional electrical stimulation;Functional mobility training;Neuromuscular re-education;Pain management;Patient/family education;Psychosocial support;Self Care/advanced ADL retraining;Skin care/wound managment;Splinting/orthotics;Therapeutic Activities;Therapeutic Exercise;UE/LE Strength taining/ROM;UE/LE Coordination activities;Wheelchair propulsion/positioning OT Self Feeding Anticipated Outcome(s): Supervision OT Basic Self-Care Anticipated Outcome(s): Min - Mod A OT Toileting Anticipated Outcome(s): Mod A OT Bathroom Transfers Anticipated Outcome(s): Min A OT Recommendation Recommendations for Other Services: Neuropsych consult Patient destination: Home Follow Up Recommendations: Home health OT;24 hour supervision/assistance Equipment Recommended: Tub/shower bench;Other (comment) (drop arm BSC)   OT Evaluation Precautions/Restrictions  Precautions Precautions: Back Precaution Comments: pathologic thoracic fracture- back precautions for safety  Pain Pain Assessment Pain Scale: 0-10 Pain Score: 5  Pain Type: Acute pain Pain Location: Shoulder Pain Descriptors / Indicators:  Aching Pain Intervention(s): Medication (See eMAR) Home Living/Prior Functioning Home Living Available Help at Discharge: Family, Available 24 hours/day Type of Home: House Home Access: Level entry, Ramped entrance Home Layout: One level Bathroom Shower/Tub: Tub/shower unit, Multimedia programmer: Handicapped height Bathroom Accessibility: Yes  Lives With: Spouse Prior Function Level of Independence: Independent with basic ADLs, Independent with homemaking with ambulation, Independent with gait  Able to Take Stairs?: Yes Driving: Yes Vocation: Retired Surveyor, mining Baseline Vision/History: Wears glasses Wears Glasses: Reading only Patient Visual Report: No change from baseline Vision Assessment?: No apparent visual deficits Perception  Perception: Within Functional Limits  Cognition Overall Cognitive Status: Within Functional Limits for tasks assessed Arousal/Alertness: Awake/alert Orientation Level: Person;Place;Situation Person: Oriented Place: Oriented Situation: Oriented Year: 2022 Month: August Day of Week: Correct Memory: Appears intact Immediate Memory Recall: Sock;Blue;Bed Memory Recall Sock: Without Cue Memory Recall Blue: Without Cue Memory Recall Bed: Without Cue Attention: Selective Selective Attention: Appears intact Awareness: Appears intact Problem Solving: Appears intact Safety/Judgment: Appears intact Sensation Sensation Light Touch: Appears Intact Proprioception: Impaired Detail Proprioception Impaired Details: Impaired RLE;Impaired LLE Coordination Gross Motor Movements are Fluid and Coordinated: No Fine Motor Movements are Fluid and Coordinated: No Finger Nose Finger Test: unable to complete  Balance Balance Balance Assessed: Yes Dynamic Sitting Balance Dynamic Sitting - Balance Support: During functional activity Dynamic Sitting - Level of Assistance: 1: +2 Total assist Dynamic Sitting - Balance Activities: Forward lean/weight  shifting Sitting balance - Comments: Engaged in UB bathing/dressing seated EOB with assist ranging from Mod A to Total +2 with pt unable to engage in tasks of bathing/dressing. Pt able to lift BUE to allow therapist to don shirt.  Pt tolerated sitting EOB ~10 mins during UB bathing/dressing Extremity/Trunk Assessment RUE Assessment RUE Assessment: Exceptions to [facility] Active Range of Motion (AROM) Comments: Pt with limited AROM, keeping generally flexed posture, able to extend elbow to 20*, wrist extension General Strength Comments: Pt demonstrates shoulders grossly 3/5; elbow 3-/5; forearm 2/5; wrist extension 3-/5; tricep 2/5, hand 0/5 LUE Assessment LUE Assessment: Exceptions to [facility] Active Range of Motion (AROM) Comments: Pt with limited AROM, keeping generally flexed posture, able to extend elbow to 20*, wrist extension General Strength Comments: Pt demonstrates shoulders grossly 3/5; elbow 3-/5; forearm 2/5; wrist extension 3-/5; tricep 2/5, hand 0/5  Care Tool Care Tool Self Care Eating        Oral Care         Bathing     Body parts bathed by helper: Right arm;Left arm;Chest;Abdomen;Front perineal area;Buttocks;Right upper leg;Left upper leg;Right lower leg;Left lower leg;Face   Assist Level: 2 Helpers    Upper Body Dressing(including orthotics)   What is the patient wearing?: Pull over shirt   Assist Level: 2 Helpers    Lower Body Dressing (excluding footwear)   What is the patient wearing?: Pants;Incontinence brief Assist for lower body dressing: 2 Helpers    Putting on/Taking off footwear   What is the patient wearing?: Non-skid slipper socks;Orthosis (PRAFOs) Assist for footwear: 2 Helpers       Care Tool Toileting Toileting activity   Assist for toileting: 2 Helpers     Care Tool Bed Mobility Roll left and right activity   Roll left and right assist level: 2 Helpers    Sit to lying activity   Sit to lying assist level: 2 Helpers    Lying to sitting edge of  bed activity   Lying to sitting edge of bed assist level: 2 Helpers     Care Tool Transfers Sit to stand transfer Sit to stand activity did not occur: Safety/medical concerns      Chair/bed transfer Chair/bed transfer activity did not occur: Safety/medical concerns       Toilet transfer Toilet transfer activity did not occur: Safety/medical concerns       Care Tool Cognition Expression of Ideas and Wants Expression of Ideas and Wants: Some difficulty - exhibits some difficulty with expressing needs and ideas (e.g, some words or finishing thoughts) or speech is not clear   Understanding Verbal and Non-Verbal Content Understanding Verbal and Non-Verbal Content: Understands (complex and basic) - clear comprehension without cues or repetitions   Memory/Recall Ability *first 3 days only Memory/Recall Ability *first 3 days only: Current season;Location of own room;Staff names and faces;That he or she is in a hospital/hospital unit    Refer to Care Plan for [place] 1 OT Short Term Goal 1 (Week 1): Pt will maintain static sitting balance 2 mins with min assist in preparation for self-care tasks. OT Short Term Goal 2 (Week 1): Pt will assist in UB bathing with AE as needed to complete UB bathing with mod assist OT Short Term Goal 3 (Week 1): Pt will engage in rolling with max assist of one caregiver to decrease burden of care with toileting  Recommendations for other services: Neuropsych   Skilled Therapeutic Intervention OT eval completed with discussion of rehab process, OT purpose, POC, ELOS, and goals.  ADL assessment completed at bed level with focus on bathing and dressing and sequencing of rolling and body positioning.  Pt required total +2 for all rolling and max-total assist to maintain in sidelying to allow 2nd person to complete hygiene.  Began education on LB dressing technique and body positioning to assist in rolling and for improved positioning in  bed.  Completed sidelying to sitting with total +2.  Pt reports initial lightheadedness upon sitting EOB, however improved with time.  Engaged in [place] bathing and dressing total assist with +2 to maintain sitting balance as pt with no trunk control or postural reactions.  Pt maintaining BUE in flexed and internally rotated position, however able to extend elbows voluntarily and abduct to allow therapist to wash underarms.  Pt returned to supine and positioned  upright in modified chair position for increased postural control and to acclimate to more upright position.  PRAFOs returned to BLE.   ADL ADL Upper Body Bathing: Dependent (+2) Where Assessed-Upper Body Bathing: Edge of bed Lower Body Bathing: Dependent (+2) Where Assessed-Lower Body Bathing: Bed level Upper Body Dressing: Dependent (+2) Where Assessed-Upper Body Dressing: Edge of bed Lower Body Dressing: Dependent (+2) Where Assessed-Lower Body Dressing: Bed level Toileting: Dependent (+2) Where Assessed-Toileting: Bed level Toilet Transfer: Not assessed Tub/Shower Transfer: Not assessed Mobility  Bed Mobility Bed Mobility: Rolling Right;Rolling Left;Right Sidelying to Sit;Sit to Sidelying Right Rolling Right: 2 Helpers Rolling Left: 2 Helpers Right Sidelying to Sit: 2 Helpers Sit to Sidelying Right: 2 Helpers   Discharge Criteria: Patient will be discharged from OT if patient refuses treatment 3 consecutive times without medical reason, if treatment goals not met, if there is a change in medical status, if patient makes no progress towards goals or if patient is discharged from hospital.  The above assessment, treatment plan, treatment alternatives and goals were discussed and mutually agreed upon: by patient  Simonne Come 12/14/2020, 10:27 AM

## 2020-12-14 NOTE — Progress Notes (Signed)
PROGRESS NOTE   Subjective/Complaints:  Pt reports slept much better- pain better and sleep overall better- loves the lidoderm patches on neck and shoulders.  Trouble activating cal bell- nurse trying to get sip and puff call bell since trouble activating soft call bell. Very limiting per pt.  Also having pain with ROM of arms B/L- L>R- feel tight.  Trying to use ICS, but makes him tired.   ROS:  Pt denies SOB, abd pain, CP, N/V/C/D, and vision changes   Objective:   No results found. Recent Labs    12/13/20 1856 12/14/20 0519  WBC 9.7 11.2*  HGB 12.0* 12.4*  HCT 34.0* 33.9*  PLT 146* 152   Recent Labs    12/13/20 0033 12/13/20 1856 12/14/20 0519  NA 137  --  135  K 3.7  --  3.7  CL 111  --  106  CO2 20*  --  21*  GLUCOSE 102*  --  96  BUN 17  --  18  CREATININE 0.76 0.84 0.81  CALCIUM 7.8*  --  8.0*    Intake/Output Summary (Last 24 hours) at 12/14/2020 1030 Last data filed at 12/14/2020 0813 Gross per 24 hour  Intake 240 ml  Output 800 ml  Net -560 ml        Physical Exam: Vital Signs Blood pressure (!) 160/69, pulse 75, temperature 99.7 F (37.6 C), resp. rate 18, SpO2 94 %.    General: awake, alert, appropriate,laying sitting up slightly in bed; arms in flexed position at elbows, flattened affect, Call bell tucked into pocket;  NAD HENT: conjugate gaze; oropharynx moist CV: regular rate; no JVD Pulmonary: CTA B/L; no W/R/R- good air movement- got up to 1200cc on ICS GI: soft, NT, ND, (+)BS Psychiatric: appropriate but flat; slightly depressed, but did smile a few times.  Neurological: Ox3  Genitourinary:    Comments: Has foley- scrotum is similar size, but a little more wrinkled than yesterday.  Musculoskeletal:     Comments: RUE- biceps 4+/5, WE 3-/5, triceps 2-/5, grip 2-/5, finger abd 0/5 LUE- biceps 5-/5, WE 3-/5, triceps 0/5, grip 1/5, and finger abd 0/5 LE's 0/5 in HF, KE, DF, PF  and EHL B/L  ASIA exam completed-  B/L shoulders/deltoids TTP- esp just past AC joints- less TTP over RTC/posterior shoulder- B/L shoulders TTP Also TTP/tight around cervical paraspinals and splenius captis and scalenes B/L   Skin:    General: Skin is warm and dry.     Comments: No skin breakdown seen; no LE edema  Neurological:     Comments: Patient is alert.  No acute distress.  He is somewhat hard of hearing.  Oriented x3. C5 intact to light touch; however decreased from C6 to S1 B/L - so checked every level in C5-T2; T3-T12 and L1-S1- also c/o decreased sensation on buttocks- like "asleep".    MAS of LUE and MAS of 1 in RUE- MAS normal/zero in LE's B/L     Assessment/Plan: 1. Functional deficits which require 3+ hours per day of interdisciplinary therapy in a comprehensive inpatient rehab setting. Physiatrist is providing close team supervision and 24 hour management of active medical problems listed below. Physiatrist  and rehab team continue to assess barriers to discharge/monitor patient progress toward functional and medical goals  Care Tool:  Bathing        Body parts bathed by helper: Right arm, Left arm, Chest, Abdomen, Front perineal area, Buttocks, Right upper leg, Left upper leg, Right lower leg, Left lower leg, Face     Bathing assist Assist Level: 2 Helpers     Upper Body Dressing/Undressing Upper body dressing   What is the patient wearing?: Pull over shirt    Upper body assist Assist Level: 2 Helpers    Lower Body Dressing/Undressing Lower body dressing      What is the patient wearing?: Pants, Incontinence brief     Lower body assist Assist for lower body dressing: 2 Helpers     Toileting Toileting    Toileting assist Assist for toileting: 2 Helpers     Transfers Chair/bed transfer  Transfers assist  Chair/bed transfer activity did not occur: Safety/medical concerns        Locomotion Ambulation   Ambulation assist               Walk 10 feet activity   Assist           Walk 50 feet activity   Assist           Walk 150 feet activity   Assist           Walk 10 feet on uneven surface  activity   Assist           Wheelchair     Assist               Wheelchair 50 feet with 2 turns activity    Assist            Wheelchair 150 feet activity     Assist          Blood pressure (!) 160/69, pulse 75, temperature 99.7 F (37.6 C), resp. rate 18, SpO2 94 %.  Medical Problem List and Plan: 1.  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  Status post plan loop recorder              -patient may  shower after 3 days- since getting loop recorder             -ELOS/Goals: 3-4 weeks- min-mod Assist  First day of evaluations- PT, OT and SLP for dysphagia.  2.  Antithrombotics: -DVT/anticoagulation: Venous Doppler studies negative.  Continue Lovenox Pharmaceutical: Lovenox             -antiplatelet therapy: Aspirin 81 mg daily and Plavix 75 mg day x3 weeks and aspirin alone 3. Pain Management: Tylenol and will add Lidoderm patches 8pm to 8am -3 of them  8/12- slept much better- so will con't current new regimen 4. Mood: Provide emotional support  8/12- started Celexa 10 mg daily-              -antipsychotic agents: N/A 5. Neuropsych: This patient is capable of making decisions on his own behalf. 6. Skin/Wound Care: Routine skin checks 7. Fluids/Electrolytes/Nutrition: Routine in and outs with follow-up chemistries 8.  Thrombocytopenia.  Follow-up with hematology services  8/12- Plts 116k- doing better- con't oto monitor 9.  Neurogenic bowel bladder.  Foley catheter tube  has been placed for multiple in  and out catheterization- will wait til tomorrow to start bowel program- educated pt and wife/son on bowel program. Will add Lidocaine jelly for bowel program.  10.  Elevated PSA/enlarged prostate.  Family currently not committed to oncology work-up. 11. Depression and insomnia- will start Celexa 10 mg daily and melatonin 3 mg QHS prn.   8/12- no side effects so far and slept much better 12. Scrotal swelling- will elevate and Hospitalist is checking pro-BNP and giving a dose of Lasix.  Will monitor/follow.   8/12- BNP 128- so not really too elevated- con't to monitor 13. Dysphagia- on D3 thin diet- will con't SLP for swallowing therapy.  14. Resp issues-  8/12- will try and prevent pneumonia and order flutter valve- also con't ICS if desired.  15. Low level leukocytosis- will check U/A and Cx as well as CXR. Also recheck CBC with diff in AM  LOS: 1 days A FACE TO FACE EVALUATION WAS PERFORMED  Dorethia Jeanmarie 12/14/2020, 10:30 AM

## 2020-12-14 NOTE — Progress Notes (Signed)
This am patient told this nurse " I had a dream and I want to change  my code status I want everything to be done for me should my heart stop." This nurse will notify provider in Am about change in wishes

## 2020-12-14 NOTE — Plan of Care (Signed)
  Problem: RH Swallowing Goal: LTG Patient will consume least restrictive diet using compensatory strategies with assistance (SLP) Description: LTG:  Patient will consume least restrictive diet using compensatory strategies with assistance (SLP) Flowsheets (Taken 12/14/2020 2005) LTG: Pt Patient will consume least restrictive diet using compensatory strategies with assistance of (SLP): Modified Independent   Problem: RH Expression Communication Goal: LTG Patient will increase speech intelligibility (SLP) Description: LTG: Patient will increase speech intelligibility at word/phrase/conversation level with cues, % of the time (SLP) Flowsheets (Taken 12/14/2020 2005) LTG: Patient will increase speech intelligibility (SLP): Modified Independent Note: Increase breath support for speech   Problem: RH Problem Solving Goal: LTG Patient will demonstrate problem solving for (SLP) Description: LTG:  Patient will demonstrate problem solving for basic/complex daily situations with cues  (SLP) Flowsheets (Taken 12/14/2020 2005) LTG: Patient will demonstrate problem solving for (SLP): Complex daily situations LTG Patient will demonstrate problem solving for: Modified Independent

## 2020-12-14 NOTE — Progress Notes (Signed)
Inpatient Rehabilitation  Patient information reviewed and entered into eRehab system by Hila Bolding Mikeisha Lemonds, OTR/L.   Information including medical coding, functional ability and quality indicators will be reviewed and updated through discharge.    

## 2020-12-14 NOTE — Progress Notes (Signed)
Orthopedic Tech Progress Note Patient Details:  [name] 03-25-1946 [id number] Bilateral resting hand splints have been ordered from [employer]  Patient ID: [name], male   DOB: [birth year], 75 y.o.   MRN: [id number]  [name] 12/14/2020, 3:31 PM

## 2020-12-14 NOTE — Evaluation (Signed)
Physical Therapy Assessment and Plan  Patient Details  Name: [name] MRN: [id number] Date of Birth: 04/30/1946  PT Diagnosis: Quadriplegia Rehab Potential: Good ELOS: 28days   Today's Date: 12/14/2020 PT Individual Time: 4656-8127 PT Individual Time Calculation (min): 80 min    Hospital Problem: Principal Problem:   Quadriplegia [facility]) Active Problems:   Neurogenic bowel   Neurogenic bladder   Scrotal swelling   Foley catheter in place on admission   Dysphagia   Past Medical History: History reviewed. No pertinent past medical history. Past Surgical History:  Past Surgical History:  Procedure Laterality Date   LOOP RECORDER INSERTION N/A 12/13/2020   Procedure: LOOP RECORDER INSERTION;  Surgeon: [name], MD;  Location: [place] [facility];  Service: Cardiovascular;  Laterality: N/A;   TONSILLECTOMY      Assessment & Plan Clinical Impression: .Manual Puebla is a 75 year old right-handed male unremarkable past medical history except he did have a history of spinal stenosis C7-T1 not requiring surgical intervention and on no prescription medications.  Per chart review patient lives with spouse.  Independent prior to admission.  Presented 12/07/2020 with onset of progressive diffuse weakness pain across his neck/shoulders and inability to walk.  Urine drug screen was negative.  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  Neurology consulted Dr.  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  Dr.Xu felt that stroke to brain/spine likely cardioembolic in nature and recommended DAPT as well as loop recorder to rule out atrial fibrillation .  Carotid Dopplers negative for ICA stenosis.  Bilateral extremity Dopplers negative.  Echocardiogram with ejection fraction of 60 to 65% no wall motion abnormalities.  Dr. Alvy Bimler of hematology service consulted for input on pancytopenia question metastatic prostate cancer due to work-up revealing enlarged prostate and elevated PSA.  Bone scan with bone biopsy recommended for work-up of pancytopenia.  Family not interested in aggressive cancer work-up if patient did not have the ability to gain prior level of independence.  He was cleared to begin Lovenox for DVT prophylaxis.  He was placed on aspirin Plavix for CVA prophylaxis x3 weeks total then aspirin alone.  Therapy evaluations completed due to patient's quadriparesis was admitted for a comprehensive rehab program  Patient transferred to CIR on 12/13/2020 .   Patient currently requires total with mobility secondary to muscle paralysis, decreased cardiorespiratoy endurance, impaired timing and sequencing, abnormal tone, unbalanced muscle activation, and decreased coordination, and decreased sitting balance, decreased standing balance, decreased postural control, decreased balance strategies, and qudraplegia .  Prior to hospitalization, patient was independent  with mobility and lived with Spouse in a House home.  Home access is  Level entry, Ramped entrance.  Patient will benefit from skilled PT intervention to maximize safe functional mobility, minimize fall risk, and decrease caregiver burden for planned discharge home with 24 hour assist.  Anticipate patient will benefit from follow up [facility] at discharge.  PT - End of Session Activity Tolerance:  Tolerates 30+ min activity with  multiple rests Endurance Deficit: Yes Endurance Deficit Description: tolerated approx 87mn OOB in wc, required tilt due to nausea and mild dizzyness first time OOB per pt. PT Assessment Rehab Potential (ACUTE/IP ONLY): Good PT Barriers to Discharge: Inaccessible home environment;Incontinence;Weight;Other (comments) PT Barriers to Discharge Comments: new quadraplegia, [place] equipment and care needs anticipated PT Patient demonstrates impairments in the following area(s): Balance;Safety;Sensory;Edema;Endurance;Motor;Pain PT Transfers Functional Problem(s): Bed Mobility;Bed to Chair;Car;Furniture PT Locomotion Functional Problem(s): Wheelchair Mobility PT Plan PT Intensity: Minimum of 1-2 x/day ,45 to 90 minutes PT Frequency: 5 out of 7 days PT Duration Estimated Length of Stay: 28days PT Treatment/Interventions: CFinancial risk analystNeuromuscular re-education;Psychosocial support;UE/LE Strength taining/ROM;Wheelchair propulsion/positioning;Balance/vestibular training;Discharge planning;Pain management;Skin care/wound management;Therapeutic Activities;UE/LE Coordination activities;Disease management/prevention;Functional mobility training;Patient/family education;Splinting/orthotics;Therapeutic Exercise PT Transfers Anticipated Outcome(s): dependent PT Locomotion Anticipated Outcome(s): PWC use mod I w/adaptive equipment PT Recommendation Recommendations for Other Services: Therapeutic Recreation consult Therapeutic Recreation Interventions: Stress management;Outing/community reintergration (community reintegration when in prep for DC, other TBD by therapist) Patient destination: Home Equipment Recommended: To be determined Equipment Details: will likely need hoyer lift, PWC, other TBD   PT Evaluation Precautions/Restrictions Precautions Precautions: Back Precaution Comments: pathologic thoracic  fracture- back precautions for safety Restrictions Weight Bearing Restrictions: No General   Vital SignsTherapy Vitals Temp: 98.4 F (36.9 C) Pulse Rate: 67 Resp: 19 BP: 136/63 Patient Position (if appropriate): Lying Oxygen Therapy SpO2: 96 % O2 Device: Room Air Pain Pain Assessment Pain Scale: 0-10 Pain Score: 4  Pain Type: Acute pain Pain Location: Shoulder (neck and shoulders) Pain Orientation: Mid Pain Descriptors / Indicators: Aching Home Living/Prior Functioning Home Living Available Help at Discharge: Family;Available 24 hours/day Type of Home: House Home Access: Level entry;Ramped entrance Home Layout: One level Bathroom Shower/Tub: Tub/shower unit;Walk-in shower Bathroom Toilet: Handicapped height Bathroom Accessibility: Yes  Lives With: Spouse Prior Function Level of Independence: Independent with basic ADLs;Independent with homemaking with ambulation;Independent with gait Vision/Perception     Cognition Overall Cognitive Status: Within Functional Limits for tasks assessed Arousal/Alertness: Awake/alert Orientation Level: Oriented X4 Sensation Sensation Light Touch: Impaired by gross assessment (very impaired bilat LEs, absent in feet) Proprioception: Impaired Detail (intact at knees, absent ankles and great toes) Coordination Gross Motor Movements are Fluid and Coordinated: No Fine Motor Movements are Fluid and Coordinated: No Coordination and Movement Description: very poor timing bilat UEs w/limited available movement Finger Nose Finger Test: unable to complete Heel Shin Test: unable Motor  Motor Motor: Tetraplegia   Trunk/Postural Assessment  Cervical Assessment Cervical Assessment: Exceptions to [facility](impaired ability to maintain upright gaze due to postural weakness,) Thoracic Assessment Thoracic Assessment: Exceptions to [facility](very poor posural control, flexed/rounded shoulders) Lumbar Assessment Lumbar Assessment: Exceptions to  [facility]Postural Control Postural Control: Deficits on evaluation Head Control: impaired, diffulty maintaing upright gaze due to weakness Trunk Control: dependent for sitting Righting Reactions: absent LEs, severely impaired trunk/UEs Protective Responses: absent LEs, severely impaired trunk/UEs Postural Limitations: quadraplegia  Balance Balance Balance Assessed: Yes Static Sitting Balance Static Sitting - Balance Support: Feet supported Static Sitting - Level of Assistance: 1: +2 Total assist Dynamic Sitting Balance Dynamic Sitting - Balance Support: During functional activity Dynamic Sitting - Level of Assistance: 1: +2 Total assist Dynamic Sitting - Balance Activities: Forward lean/weight shifting Sitting balance - Comments: sitting balance at edge of bed, engaged in A/P wt shifting using UE flex/ext to facilitate, total assist of 2.w/this and SBT Extremity Assessment  RUE Assessment Active Range of Motion (AROM) Comments: Pt with limited AROM, keeping  generally flexed posture, able to extend elbow to 20*, wrist extension General Strength Comments: see OT assessment LUE Assessment LUE Assessment: Exceptions to [facility] Active Range of Motion (AROM) Comments: See OT assessment General Strength Comments: see OT assessment RLE Assessment RLE Assessment: Exceptions to [facility] Passive Range of Motion (PROM) Comments: WFL, some LE edema Active Range of Motion (AROM) Comments: none General Strength Comments: 0/5 LLE Assessment Passive Range of Motion (PROM) Comments: some LE edema Active Range of Motion (AROM) Comments: none General Strength Comments: 0/5  Care Tool Care Tool Bed Mobility Roll left and right activity   Roll left and right assist level: 2 Helpers    Sit to lying activity   Sit to lying assist level: 2 Helpers    Lying to sitting edge of bed activity   Lying to sitting edge of bed assist level: 2 Helpers     Care Tool Transfers Sit to stand transfer Sit to stand  activity did not occur: Safety/medical concerns      Chair/bed transfer   Chair/bed transfer assist level: 2 Armed forces training and education officer transfer activity did not occur: Safety/medical Personal assistant transfer activity did not occur: Safety/medical concerns        Care Tool Locomotion Ambulation Ambulation activity did not occur: Safety/medical concerns        Walk 10 feet activity Walk 10 feet activity did not occur: Safety/medical concerns       Walk 50 feet with 2 turns activity Walk 50 feet with 2 turns activity did not occur: Safety/medical concerns      Walk 150 feet activity Walk 150 feet activity did not occur: Safety/medical concerns      Walk 10 feet on uneven surfaces activity Walk 10 feet on uneven surfaces activity did not occur: Safety/medical concerns      Stairs Stair activity did not occur: Safety/medical concerns        Walk up/down 1 step activity Walk up/down 1 step or curb (drop down) activity did not occur: Safety/medical concerns     Walk up/down 4 steps activity did not occuR: Safety/medical concerns  Walk up/down 4 steps activity      Walk up/down 12 steps activity Walk up/down 12 steps activity did not occur: Safety/medical concerns      Pick up small objects from floor Pick up small object from the floor (from standing position) activity did not occur: Safety/medical concerns      Wheelchair Will patient use wheelchair at discharge?: Yes Type of Wheelchair: Power Wheelchair activity did not occur: Safety/medical concerns (limited tolerance for upright)      Wheel 50 feet with 2 turns activity Wheelchair 50 feet with 2 turns activity did not occur: Safety/medical concerns    Wheel 150 feet activity Wheelchair 150 feet activity did not occur: Safety/medical concerns      Refer to Care Plan for Long Term Goals  SHORT TERM GOAL WEEK 1 PT Short Term Goal 1 (Week 1): Pt will tolerate at least  1 hr periods OOB in  wc PT Short Term Goal 2 (Week 1): Pt will verbalize understanding of importance of and recite instructed  pressure relief schedule PT Short Term Goal 3 (Week 1): Pt will tolerate 10 min sitting balance activity prior to requiring supported rest breaks. PT Short Term Goal 4 (Week 1): Pt will participate in rolling via UEs and cervical motion. PT Short Term Goal 5 (Week 1): Pt will  verbalize importance of rolling x 2 hrs to prevent pressure injuries  when in bed.  Recommendations for other services: Therapeutic Recreation  Stress management and Outing/community reintegration  Skilled Therapeutic Intervention Pt total assist of 2 w/all aspects of functional mobility.   Evaluation completed (see details above and below) with education on PT POC and goals and individual treatment initiated with focus on functional mobility/transfers w/tolerance for OOB, seating and positioning, education,.  Discussed process of daily scheduling and receiving schedule, purpose  of team conference s and D/c planning.  Pt provided w/ TIS wheelchair and roho cushion and adjustments made to promote optimal seating posture and pressure distribution.  Educated pt on importance of pressure relief and how roho/tilt in space wc would assist in management of this.  Therapist obtained thigh high ted hose to assist w/BP management w/OOB activity today.  Dependent for donning. Total assist of to for supine to sit on edge of bed and for sitting balance.  Worked on using Ues to facilitate A/P wt shiftting in sitting.  Total assist of 2 for bed to/from wc transfer using sliding board.  BP measures as follows: Supine 157/90 (w/TEDs) Sitting on edge of bed 133/66 Sitting in wc 129/67, pt did c/o dizzyness/nausea after 10 min sitting in slight recline, tilted heavily and provided w/cold washcloths w/resolution of sxs. When returned supine w/teds removed 146/71  Sip and puff call bell repositioned for pt and ensured pt able to utilize.  Les  elevated w/floating heels.  Pt positioned comfortably w/needs in reach.   Mobility Bed Mobility Bed Mobility: Rolling Right;Rolling Left;Right Sidelying to Sit;Sit to Sidelying Right Rolling Right: 2 Helpers Rolling Left: 2 Helpers Right Sidelying to Sit: 2 Helpers Sit to Sidelying Right: 2 Helpers Transfers Transfers: Lateral/Scoot Transfers Lateral/Scoot Transfers: 2 Press photographer (Assistive device): Other (Comment) (sliding board) Locomotion  Gait Ambulation: No Gait Gait: No Stairs / Additional Locomotion Stairs: No Wheelchair Mobility Wheelchair Mobility: No   Discharge Criteria: Patient will be discharged from PT if patient refuses treatment 3 consecutive times without medical reason, if treatment goals not met, if there is a change in medical status, if patient makes no progress towards goals or if patient is discharged from hospital.  The above assessment, treatment plan, treatment alternatives and goals were discussed and mutually agreed upon: by patient  Jerrilyn Cairo 12/14/2020, 4:19 PM

## 2020-12-14 NOTE — Evaluation (Signed)
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 No pertinent past medical history. Past Surgical History:  Past Surgical History:  Procedure Laterality Date   LOOP RECORDER INSERTION N/A 12/13/2020   Procedure: LOOP RECORDER INSERTION;  Surgeon: [name], MD;  Location: [facility];  Service: Cardiovascular;  Laterality: N/A;   TONSILLECTOMY      Assessment / Plan / Recommendation Clinical Impression  HPI: [name] is a 75 year old right-handed male unremarkable past medical history except he did have a history of spinal stenosis C7-T1 not requiring surgical intervention and on no prescription medications.  Per chart review patient lives with spouse.  Independent prior to admission.  Presented 12/07/2020 with onset of progressive diffuse weakness pain across his neck/shoulders and inability to walk.  Urine drug screen was negative.  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  Neurology consulted Dr. 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  Dr.Xu felt that stroke to brain/spine likely cardioembolic in nature and recommended DAPT as well as loop recorder to rule out atrial fibrillation .  Carotid Dopplers negative for ICA stenosis.  Bilateral extremity Dopplers negative.  Echocardiogram with ejection fraction of 60 to 65% no wall motion abnormalities.  Dr. Alvy Bimler of hematology service consulted for input on pancytopenia question metastatic prostate cancer due to work-up revealing enlarged prostate and elevated PSA.  Bone scan with bone biopsy recommended for work-up of pancytopenia.  Family not interested in aggressive cancer work-up if patient did not have the ability to gain prior level of independence.  He was cleared to begin Lovenox for DVT prophylaxis.  He was placed on aspirin Plavix for CVA prophylaxis x3 weeks total then aspirin alone.  Therapy evaluations completed due to patient's quadriparesis was admitted for a comprehensive rehab program.  Patient presents with decreased communication effectiveness secondary to weak vocal intensity and reduced breath support for speech. He intentionally produces short sentences due to poor breath support which limits him from producing 4-5 words at a time. Despite these challenges, patient was perceived as >90% intelligible at sentence level. Patient would benefit from intervention to address breath support for speech in which he reports as his primary area of focus from a SLP perspective to increase speech efficiency and effectiveness. Patient's auditory comprehension and verbal expression appear WFL.  Patient agreeable to participate in cognitive-linguistic evaluation using [place] Mental Status (SLUMS) with score 23/26 (omitted clock drawing d/t inability to write  from spinal cord involvement). Score suggestive of cognitive functioning [facility]  on standard assessment however deficits in higher level problem solving and decreased processing speed were noted during informal assessment in which patient reports as below baseline. Patient agreeable to treatment to address higher level problem solving.  Patient is currently tolerating Dys 3 diet and thin liquids by straw and dependent for self-feeding. He consumed single and sequential sips of thin liquid by straw without overt s/sx of aspiration. Patient denied consideration of solid PO trials to assess tolerance. Patient denied difficulty at this time and independently recalled safe swallow precaution and strategies discussed by SLP while in acute care. Patient reports preference for Dys 3 diet for energy conservation purposes and open to consideration of regular texture trials in future sessions.  Patient would benefit from skilled SLP intervention to maximize voice/speech production, swallowing, and cognitive functions.    Skilled Therapeutic Interventions          SLP administered cognitive-linguistic and speech assessments. Please see above.    SLP Assessment  Patient will need skilled Speech Lanaguage Pathology Services during CIR admission    Recommendations  SLP Diet Recommendations: Dysphagia 3 (Mech soft);Thin Liquid Administration via: Straw Medication Administration: Whole meds with puree Supervision: Full supervision/cueing for compensatory strategies;Staff to assist with self feeding Compensations: Small sips/bites;Slow rate Postural Changes and/or Swallow Maneuvers: Seated upright 90 degrees;Upright 30-60 min after meal Oral Care Recommendations: Oral care BID Patient destination: Home Follow up Recommendations:  (None anticipated, although TBD) Equipment Recommended: None recommended by SLP    SLP Frequency 1 to 3 out of 7 days   SLP Duration  SLP Intensity  SLP Treatment/Interventions 28  days  Minumum of 1-2 x/day, 30 to 90 minutes  Dysphagia/aspiration precaution training;Patient/family education;Internal/external aids;Other (comment)    Pain Pain Assessment Pain Scale: 0-10 Pain Score: 4  Pain Type: Acute pain Pain Location: Shoulder (neck and shoulders) Pain Orientation: Mid Pain Descriptors / Indicators: Aching  Prior Functioning Cognitive/Linguistic Baseline: Within functional limits (Patient endorsed some difficulty with memory at prior level) Type of Home: House  Lives With: Spouse Available Help at Discharge: Family;Available 24 hours/day Vocation: Retired  SLP Evaluation Cognition Overall Cognitive Status: Impaired/Different from baseline Arousal/Alertness: Awake/alert Orientation Level: Oriented X4 Attention: Selective Selective Attention: Appears intact Memory: Impaired Memory Impairment: Decreased recall of new information Awareness: Appears intact Problem Solving: Impaired Problem Solving Impairment: Verbal complex Safety/Judgment: Appears intact  Comprehension Auditory Comprehension Overall Auditory Comprehension: Appears within functional limits for tasks assessed Expression Expression Primary Mode of Expression: Verbal Verbal Expression Overall Verbal Expression: Appears within functional limits for tasks assessed Oral Motor Oral Motor/Sensory Function Overall Oral Motor/Sensory Function: Within functional limits Motor Speech Overall Motor Speech: Appears within functional limits for tasks assessed  Care Tool Care Tool Cognition Expression of Ideas and Wants Expression of Ideas and Wants: Some difficulty - exhibits some difficulty with expressing needs and ideas (e.g, some words or finishing thoughts) or speech is not clear   Understanding Verbal and Non-Verbal Content Understanding Verbal and Non-Verbal Content: Understands (complex and basic) - clear comprehension without cues or repetitions   Memory/Recall Ability *first 3  days only Memory/Recall Ability *first 3 days only: Current season;Location of own room;That he or she is in a hospital/hospital unit     Bedside Swallowing Assessment General Date of Onset: 12/07/20 Previous Swallow Assessment: Bedside Swallow 8/6 with follow up 8/7 Diet Prior to this Study: Dysphagia 3 (soft);Thin liquids Respiratory Status: Room air History of Recent Intubation: No Behavior/Cognition: Alert;Cooperative Oral Cavity - Dentition: Adequate natural dentition Self-Feeding Abilities:   Total assist Patient Positioning: Upright in bed Baseline Vocal Quality: Low vocal intensity Volitional Cough: Weak Volitional Swallow: Able to elicit   Thin Liquid Thin Liquid: Within functional limits Presentation: Straw Puree Other Comments: patient declined trials Solid  Patient denied trials BSE Assessment Risk for Aspiration Impact on safety and function: Mild aspiration risk  Short Term Goals: Week 1: SLP Short Term Goal 1 (Week 1): Patient will implement breath support techniques to increase vocal intensity and effectiveness at sentence level with sup A cues SLP Short Term Goal 2 (Week 1): Patient will tolerate current diet without overt s/sx of aspiration with sup A verbal cues to implement safe swallow precautions SLP Short Term Goal 3 (Week 1): Patient will demonstrate effecient mastication and oral clearance of regular textures over 2 observed sessions to demonstrate readiness for diet advancement SLP Short Term Goal 4 (Week 1): Patient will complete complex problem solving with sup A verbal cues  Refer to Care Plan for Long Term Goals  Recommendations for other services: None   Discharge Criteria: Patient will be discharged from SLP if patient refuses treatment 3 consecutive times without medical reason, if treatment goals not met, if there is a change in medical status, if patient makes no progress towards goals or if patient is discharged from hospital.  The above  assessment, treatment plan, treatment alternatives and goals were discussed and mutually agreed upon: by patient  Patty Sermons 12/14/2020, 7:49 PM

## 2020-12-14 NOTE — Discharge Instructions (Addendum)
Inpatient Rehab Discharge Instructions  [name] Discharge date and time: No discharge date for patient encounter.   Activities/Precautions/ Functional Status: Activity: activity as tolerated Diet: regular diet Wound Care: Routine skin checks Functional status:  ___ No restrictions     ___ Walk up steps independently ___ 24/7 supervision/assistance   ___ Walk up steps with assistance ___ Intermittent supervision/assistance  ___ Bathe/dress independently ___ Walk with walker     _x__ Bathe/dress with assistance ___ Walk Independently    ___ Shower independently ___ Walk with assistance    ___ Shower with assistance ___ No alcohol     ___ Return to work/school ________   COMMUNITY REFERRALS UPON DISCHARGE:    Home Health:   PT     OT       RN    SNA    SW                   Agency:[facility]  Phone: Cheryl/liaison #[phone]; [facility] #[phone] *Please expect follow-up to schedule your home visit. Expected start of care to be on 01/11/2021. If you have not received follow-up, be sure to contact liaison Elnita Maxwell first! If no follow-up, please contact the branch.*  Medical Equipment/Items Ordered:hospital bed (fully electric), hoyer lift with sling, drop arm bedside commode, 30" transfer board, loaner power chair                                                 Agency/Supplier: [facility] [phone]   Medical Equipment/Items Ordered:1 foley catheter per month, and incontinence supplies                                                 Agency/Supplier: [facility] [phone]  **BE SURE TO CONTACT [facility] FOR ANY FOLLOW-UP APPOINTMENTS WITHIN THE [facility] [phone].**  Special Instructions: No smoking or alcohol  Continue aspirin 81 mg daily and Plavix any 5 mg day x3 weeks thenSTROKE/TIA DISCHARGE INSTRUCTIONS SMOKING Cigarette smoking nearly doubles your risk of having a stroke & is the single most alterable risk factor  If you smoke or have  smoked in the last 12 months, you are advised to quit smoking for your health. Most of the excess cardiovascular risk related to smoking disappears within a year of stopping. Ask you doctor about anti-smoking medications [place] Quit Line: 1-800-QUIT NOW Free Smoking Cessation Classes [phone]  CHOLESTEROL Know your levels; limit fat & cholesterol in your diet  Lipid Panel     Component Value Date/Time   CHOL 231 (H) 12/08/2020 0507   TRIG 66 12/08/2020 0507   HDL 38 (L) 12/08/2020 0507   CHOLHDL 6.1 12/08/2020 0507   VLDL 13 12/08/2020 0507   LDLCALC 180 (H) 12/08/2020 0507     Many patients benefit from treatment even if their cholesterol is at goal. Goal: Total Cholesterol (CHOL) less than 160 Goal:  Triglycerides (TRIG) less than 150 Goal:  HDL greater than 40 Goal:  LDL (LDLCALC) less than 100   BLOOD PRESSURE American Stroke Association blood pressure target is less that 120/80 mm/Hg  Your discharge blood pressure is:  BP: (!) 160/69 Monitor your blood pressure Limit your salt and alcohol intake Many individuals will require  more than one medication for high blood pressure  DIABETES (A1c is a blood sugar average for last 3 months) Goal HGBA1c is under 7% (HBGA1c is blood sugar average for last 3 months)  Diabetes: No known diagnosis of diabetes    Lab Results  Component Value Date   HGBA1C 5.5 12/08/2020    Your HGBA1c can be lowered with medications, healthy diet, and exercise. Check your blood sugar as directed by your physician Call your physician if you experience unexplained or low blood sugars.  PHYSICAL ACTIVITY/REHABILITATION Goal is 30 minutes at least 4 days per week  Activity: Increase activity slowly, Therapies: Physical Therapy: Home Health Return to work:  Activity decreases your risk of heart attack and stroke and makes your heart stronger.  It helps control your weight and blood pressure; helps you relax and can improve your mood. Participate in a  regular exercise program. Talk with your doctor about the best form of exercise for you (dancing, walking, swimming, cycling).  DIET/WEIGHT Goal is to maintain a healthy weight  Your discharge diet is:  Diet Order             DIET DYS 3 Room service appropriate? Yes; Fluid consistency: Thin  Diet effective now                   liquids Your height is:    Your current weight is:   Your Body Mass Index (BMI) is:    Following the type of diet specifically designed for you will help prevent another stroke. Your goal weight range is:   Your goal Body Mass Index (BMI) is 19-24. Healthy food habits can help reduce 3 risk factors for stroke:  High cholesterol, hypertension, and excess weight.  RESOURCES Stroke/Support Group:  Call [phone]   STROKE EDUCATION PROVIDED/REVIEWED AND GIVEN TO PATIENT Stroke warning signs and symptoms How to activate emergency medical system (call 911). Medications prescribed at discharge. Need for follow-up after discharge. Personal risk factors for stroke. Pneumonia vaccine given: No Flu vaccine given: No My questions have been answered, the writing is legible, and I understand these instructions.  I will adhere to these goals & educational materials that have been provided to me after my discharge from the hospital.     aspirin alone   My questions have been answered and I understand these instructions. I will adhere to these goals and the provided educational materials after my discharge from the hospital.  Patient/Caregiver Signature _______________________________ Date __________  Clinician Signature _______________________________________ Date __________  Please bring this form and your medication list with you to all your follow-up doctor's appointments.    ====================================================  Enoxaparin injection What is this medicine? ENOXAPARIN (ee nox a PA rin) is used after knee, hip, or abdominal surgeries to  prevent blood clotting. It is also used to treat existing blood clots in the lungs or in the veins. This medicine may be used for other purposes; ask your health care provider or pharmacist if you have questions. COMMON BRAND NAME(S): Lovenox  What should I tell my health care provider before I take this medicine? They need to know if you have any of these conditions: bleeding disorders, hemorrhage, or hemophilia infection of the heart or heart valves kidney or liver disease previous stroke prosthetic heart valve recent surgery or delivery of a baby ulcer in the stomach or intestine, diverticulitis, or other bowel disease an unusual or allergic reaction to enoxaparin, heparin, pork or pork products, other medicines, foods, dyes,  or preservatives pregnant or trying to get pregnant breast-feeding  How should I use this medicine? This medicine is for injection under the skin. It is usually given by a health-care professional. You or a family member may be trained on how to give the injections. If you are to give yourself injections, make sure you understand how to use the syringe, measure the dose if necessary, and give the injection. To avoid bruising, do not rub the site where this medicine has been injected. Do not take your medicine more often than directed. Do not stop taking except on the advice of your doctor or health care professional. Make sure you receive a puncture-resistant container to dispose of the needles and syringes once you have finished with them. Do not reuse these items. Return the container to your doctor or health care professional for proper disposal. Talk to your pediatrician regarding the use of this medicine in children. Special care may be needed. Overdosage: If you think you have taken too much of this medicine contact a poison control center or emergency room at once. NOTE: This medicine is only for you. Do not share this medicine with others.  See this Video for  how to administer Enoxaparin: [email or web address].com/patient-self-[email or web address]   What if I miss a dose? If you miss a dose, take it as soon as you can. If it is almost time for your next dose, take only that dose. Do not take double or extra doses.  What may interact with this medicine? aspirin and aspirin-like medicines certain medicines that treat or prevent blood clots dipyridamole NSAIDs, medicines for pain and inflammation, like ibuprofen or naproxen This list may not describe all possible interactions. Give your health care provider a list of all the medicines, herbs, non-prescription drugs, or dietary supplements you use. Also tell them if you smoke, drink alcohol, or use illegal drugs. Some items may interact with your medicine.  What should I watch for while using this medicine? Visit your healthcare professional for regular checks on your progress. You may need blood work done while you are taking this medicine. Your condition will be monitored carefully while you are receiving this medicine. It is important not to miss any appointments. If you are going to need surgery or other procedure, tell your healthcare professional that you are using this medicine. Using this medicine for a long time may weaken your bones and increase the risk of bone fractures. Avoid sports and activities that might cause injury while you are using this medicine. Severe falls or injuries can cause unseen bleeding. Be careful when using sharp tools or knives. Consider using an Neurosurgeonelectric razor. Take special care brushing or flossing your teeth. Report any injuries, bruising, or red spots on the skin to your healthcare professional. Wear a medical ID bracelet or chain. Carry a card that describes your disease and details of your medicine and dosage times.  What side effects may I notice from receiving this medicine? Side effects that you should report to your doctor or health care professional as soon as  possible: allergic reactions like skin rash, itching or hives, swelling of the face, lips, or tongue bone pain signs and symptoms of bleeding such as bloody or black, tarry stools; red or dark-brown urine; spitting up blood or brown material that looks like coffee grounds; red spots on the skin; unusual bruising or bleeding from the eye, gums, or nose signs and symptoms of a blood clot such as chest pain; shortness of breath; pain, swelling,  or warmth in the leg signs and symptoms of a stroke such as changes in vision; confusion; trouble speaking or understanding; severe headaches; sudden numbness or weakness of the face, arm or leg; trouble walking; dizziness; loss of coordination Side effects that usually do not require medical attention (report to your doctor or health care professional if they continue or are bothersome): hair loss pain, redness, or irritation at site where injected This list may not describe all possible side effects. Call your doctor for medical advice about side effects. You may report side effects to FDA at 1-800-FDA-1088.  Where should I keep my medicine? Keep out of the reach of children. Store at room temperature between 15 and 30 degrees C (59 and 86 degrees F). Do not freeze. If your injections have been specially prepared, you may need to store them in the refrigerator. Ask your pharmacist. Throw away any unused medicine after the expiration date.   NOTE: This sheet is a summary. It may not cover all possible information. If you have questions about this medicine, talk to your doctor, pharmacist, or health care provider.  2020 Elsevier/Gold Standard (2017-04-16 11:25:34)   ================================================  Anticoagulant Injection Instructions Using a Prefilled Syringe    Injectable blood thinners (anticoagulants) are medicines that help prevent blood clots from developing. Two common injectable anticoagulant medicines that are used are  fondaparinux and enoxaparin. Injectable anticoagulant medicines are given with a single-use syringe that already has medicine inside of it (prefilled syringe). You inject the medicine through a needle into the layer of fat and tissue between skin and muscle (subcutaneous) in your belly. Before your first injection, your health care provider will instruct you on how to take your anticoagulant medicine at home. Also read the medication guide or package insert that came with the prefilled syringe. Follow directions from the guide about how to prepare and give the injection.  What are the risks? Generally, self-injection of anticoagulant medicine is safe. However, mild problems can occur, including mild bleeding, itching, or rash at the injection site. Other risks may include: Bleeding and bruising. A low platelet count (thrombocytopenia). An allergic reaction to the medication. Liver damage. A low red blood cell count (anemia).  Supplies needed: To inject the medicine, you will need: Alcohol wipes. A prefilled syringe with needle. A container for syringe disposal. This may be a puncture-proof sharps container or a hard-sided plastic container with a cover, such as an empty laundry detergent bottle.  How to inject anticoagulant medicine Wash your hands with soap and water. If soap and water are not available, use alcohol-based hand sanitizer. Locate the site on your belly where the medicine should be injected. Avoid the area within 2 inches (5 cm) of your navel (umbilicus). Use an alcohol wipe to clean the site where you will be injecting the needle. Let the site air-dry. Remove the plastic cover from the needle on the syringe. Do not let the needle touch anything. Hold the syringe with one hand and use your other hand to twist the rigid needle guard (covering the needle) counterclockwise. Pull the needle guard straight off the needle. Discard the needle guard. When using a prefilled syringe, do  not push the air bubble out of the syringe before the injection. The air bubble will help you get all of the medicine out of the syringe and into your subcutaneous tissue. Hold the syringe in your writing hand like a pencil. Use your other hand to pinch and hold about an inch (2.5 cm)  of skin. Do not directly touch the cleaned part of the skin. Insert the entire needle straight into the fold of skin. The needle should be at a 90-degree angle (perpendicular) to the skin. Push the needle all the way against the skin. After the needle is completely inserted into the skin, release the skin that you are pinching. Continue to hold the syringe with your writing hand. Use the thumb or index finger of your writing hand to push the plunger all the way into the syringe to inject the medicine. Pull the needle straight out of the skin. Press and hold an alcohol wipe over the injection site until the bleeding stops. Do not rub the area. Cover the injection site with a bandage, if needed. Do not recap the needle. If your syringe has a safety system for shielding the needle after injection: Firmly push down on the plunger after you complete the injection. The protective sleeve will automatically cover the needle, and you will hear a click. The click means that the needle is safely covered. Place the syringe and needle in the disposal container.  What else do I need to know? Do not use the syringe or needle more than one time. Change the injection site each time you give yourself a shot. Before an injection, make sure the medicine is a clear and colorless or pale yellow. Do not use your medicine if it is discolored or has particles in it. Let your health care provider know right away. Tell your health care providers, including dentists: That you are taking an anticoagulant, especially if you are injured or plan to have a procedure. If there is a change in your illness, and any other changes in medicines,  supplements, or diet. Take over-the-counter and prescription medicines only as told by your health care provider. Keep your medicine safely stored at room temperature. Keep all follow-up visits as told by your health care provider.  Contact a health care provider if: You develop any rashes on your skin. You have large areas of bruising on your skin. Your condition worsens. You develop a fever. There is blood in your urine. You are bleeding from your gums.  Get help right away if: You develop bleeding problems such as: Vomiting blood or coughing up blood. Dark red blood in your urine. Blood in your stool or your stool has a dark, tarry, or coffee-ground appearance. You have bleeding that does not stop. You develop chest pain or shortness of breath. You develop a severe headache or confusion.  These symptoms may represent a serious problem that is an emergency. Do not wait to see if the symptoms will go away. Get medical help right away. Call your local emergency services (911 in the [place] Do not drive yourself to the hospital. Summary Injectable blood thinners (anticoagulants) are medicines that help prevent blood clots from developing in the veins. You inject the medicine through a needle into the layer of fat and tissue between skin and muscle (subcutaneous) in your belly. Keep all follow-up visits as told by your health care provider. Follow-up visits include visits for lab tests. This information is not intended to replace advice given to you by your health care provider. Make sure you discuss any questions you have with your health care provider. Document Revised: 05/30/2017 Document Reviewed: 05/30/2017 Elsevier Patient Education  2020 [employer].   =======================.rxavs

## 2020-12-15 DIAGNOSIS — K592 Neurogenic bowel, not elsewhere classified: Secondary | ICD-10-CM

## 2020-12-15 DIAGNOSIS — N390 Urinary tract infection, site not specified: Secondary | ICD-10-CM

## 2020-12-15 DIAGNOSIS — N319 Neuromuscular dysfunction of bladder, unspecified: Secondary | ICD-10-CM

## 2020-12-15 DIAGNOSIS — R1310 Dysphagia, unspecified: Secondary | ICD-10-CM

## 2020-12-15 LAB — CBC WITH DIFFERENTIAL/PLATELET
Abs Immature Granulocytes: 0.08 10*3/uL — ABNORMAL HIGH (ref 0.00–0.07)
Basophils Absolute: 0 10*3/uL (ref 0.0–0.1)
Basophils Relative: 0 %
Eosinophils Absolute: 0.1 10*3/uL (ref 0.0–0.5)
Eosinophils Relative: 1 %
HCT: 33.5 % — ABNORMAL LOW (ref 39.0–52.0)
Hemoglobin: 12.1 g/dL — ABNORMAL LOW (ref 13.0–17.0)
Immature Granulocytes: 1 %
Lymphocytes Relative: 4 %
Lymphs Abs: 0.5 10*3/uL — ABNORMAL LOW (ref 0.7–4.0)
MCH: 32.4 pg (ref 26.0–34.0)
MCHC: 36.1 g/dL — ABNORMAL HIGH (ref 30.0–36.0)
MCV: 89.6 fL (ref 80.0–100.0)
Monocytes Absolute: 1 10*3/uL (ref 0.1–1.0)
Monocytes Relative: 8 %
Neutro Abs: 10.9 10*3/uL — ABNORMAL HIGH (ref 1.7–7.7)
Neutrophils Relative %: 86 %
Platelets: 169 10*3/uL (ref 150–400)
RBC: 3.74 MIL/uL — ABNORMAL LOW (ref 4.22–5.81)
RDW: 12.7 % (ref 11.5–15.5)
WBC: 12.6 10*3/uL — ABNORMAL HIGH (ref 4.0–10.5)
nRBC: 0 % (ref 0.0–0.2)

## 2020-12-15 MED ORDER — SIMETHICONE 80 MG PO CHEW
80.0000 mg | CHEWABLE_TABLET | Freq: Four times a day (QID) | ORAL | Status: DC | PRN
  Administered 2020-12-15: 80 mg via ORAL
  Filled 2020-12-15: qty 1

## 2020-12-15 MED ORDER — NITROFURANTOIN MONOHYD MACRO 100 MG PO CAPS
100.0000 mg | ORAL_CAPSULE | Freq: Two times a day (BID) | ORAL | Status: AC
  Administered 2020-12-15 – 2020-12-21 (×14): 100 mg via ORAL
  Filled 2020-12-15 (×14): qty 1

## 2020-12-15 MED ORDER — CHLORHEXIDINE GLUCONATE CLOTH 2 % EX PADS
6.0000 | MEDICATED_PAD | Freq: Two times a day (BID) | CUTANEOUS | Status: DC
  Administered 2020-12-15 – 2021-01-10 (×52): 6 via TOPICAL

## 2020-12-15 NOTE — Plan of Care (Signed)
  Problem: SCI BOWEL ELIMINATION Goal: RH STG MANAGE BOWEL WITH ASSISTANCE Description: STG Manage Bowel with mod Assistance. Outcome: Not Progressing; incontinence   Problem: SCI BLADDER ELIMINATION Goal: RH STG MANAGE BLADDER WITH ASSISTANCE Description: STG Manage Bladder With mod Assistance Outcome: Not Progressing; foley cath

## 2020-12-15 NOTE — Progress Notes (Signed)
PROGRESS NOTE   Subjective/Complaints: Patient seen sitting up in bed this morning.  Wife at bedside.  He states he slept fairly well overnight.  Wife notes improvement in bowel movements.  ROS: Denies CP, SOB, N/V/D  Objective:   DG CHEST PORT 1 VIEW  Result Date: 12/14/2020 CLINICAL DATA:  Leukocytosis EXAM: PORTABLE CHEST 1 VIEW COMPARISON:  12/07/2020 FINDINGS: Loop recorder device overlies the left chest wall. Heart size within normal limits. Atherosclerotic calcification of the aortic knob. Low lung volumes. Band-like opacities within the lung bases suggest atelectasis. Lungs appear otherwise clear. No pleural effusion or pneumothorax. IMPRESSION: Low lung volumes with bibasilar atelectasis. Electronically Signed   By: Duanne Guess D.O.   On: 12/14/2020 14:21   Recent Labs    12/14/20 0519 12/15/20 0620  WBC 11.2* 12.6*  HGB 12.4* 12.1*  HCT 33.9* 33.5*  PLT 152 169    Recent Labs    12/13/20 0033 12/13/20 1856 12/14/20 0519  NA 137  --  135  K 3.7  --  3.7  CL 111  --  106  CO2 20*  --  21*  GLUCOSE 102*  --  96  BUN 17  --  18  CREATININE 0.76 0.84 0.81  CALCIUM 7.8*  --  8.0*     Intake/Output Summary (Last 24 hours) at 12/15/2020 1140 Last data filed at 12/15/2020 0900 Gross per 24 hour  Intake 500 ml  Output 2600 ml  Net -2100 ml         Physical Exam: Vital Signs Blood pressure (!) 158/74, pulse 72, temperature 98.9 F (37.2 C), temperature source Oral, resp. rate 16, height 5' 8.5" (1.74 m), weight 73.2 kg, SpO2 96 %. Constitutional: No distress . Vital signs reviewed. HENT: Normocephalic.  Atraumatic. Eyes: EOMI. No discharge. Cardiovascular: No JVD.  RRR. Respiratory: Normal effort.  No stridor.  Bilateral clear to auscultation. GI: Non-distended.  BS +. GU: + Foley Skin: Warm and dry.  Intact. Psych: Normal mood.  Normal behavior. Musc: No edema in extremities.  No tenderness in  extremities. Neuro: Alert Motor RUE- biceps 4/5, WE 3-/5, triceps 2-/5, grip 2-/5, finger abd 0/5 LUE- biceps 4/5, WE 3-/5, triceps 0/5, grip 1/5, and finger abd 0/5 LE's 0/5 in HF, KE, DF, APF   Assessment/Plan: 1. Functional deficits which require 3+ hours per day of interdisciplinary therapy in a comprehensive inpatient rehab setting. Physiatrist is providing close team supervision and 24 hour management of active medical problems listed below. Physiatrist and rehab team continue to assess barriers to discharge/monitor patient progress toward functional and medical goals  Care Tool:  Bathing        Body parts bathed by helper: Right arm, Left arm, Chest, Abdomen, Front perineal area, Buttocks, Right upper leg, Left upper leg, Right lower leg, Left lower leg, Face     Bathing assist Assist Level: 2 Helpers     Upper Body Dressing/Undressing Upper body dressing   What is the patient wearing?: Pull over shirt    Upper body assist Assist Level: 2 Helpers    Lower Body Dressing/Undressing Lower body dressing      What is the patient wearing?: Pants, Incontinence  brief     Lower body assist Assist for lower body dressing: 2 Sports administrator assist Assist for toileting: 2 Helpers     Transfers Chair/bed transfer  Transfers assist  Chair/bed transfer activity did not occur: Safety/medical concerns  Chair/bed transfer assist level: 2 Helpers     Locomotion Ambulation   Ambulation assist   Ambulation activity did not occur: Safety/medical concerns          Walk 10 feet activity   Assist  Walk 10 feet activity did not occur: Safety/medical concerns        Walk 50 feet activity   Assist Walk 50 feet with 2 turns activity did not occur: Safety/medical concerns         Walk 150 feet activity   Assist Walk 150 feet activity did not occur: Safety/medical concerns         Walk 10 feet on uneven surface   activity   Assist Walk 10 feet on uneven surfaces activity did not occur: Safety/medical concerns         Wheelchair     Assist Will patient use wheelchair at discharge?: Yes Type of Wheelchair: Power Wheelchair activity did not occur: Safety/medical concerns (limited tolerance for upright)         Wheelchair 50 feet with 2 turns activity    Assist    Wheelchair 50 feet with 2 turns activity did not occur: Safety/medical concerns       Wheelchair 150 feet activity     Assist  Wheelchair 150 feet activity did not occur: Safety/medical concerns       Blood pressure (!) 158/74, pulse 72, temperature 98.9 F (37.2 C), temperature source Oral, resp. rate 16, height 5' 8.5" (1.74 m), weight 73.2 kg, SpO2 96 %.  Medical Problem List and Plan: 1.  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  Status post plan loop recorder  Continue CIR 2.  Antithrombotics: -DVT/anticoagulation: Venous Doppler studies negative.  Continue Lovenox Pharmaceutical: Lovenox             -antiplatelet therapy: Aspirin 81 mg daily and Plavix 75 mg day x3 weeks and aspirin alone 3. Pain Management: Tylenol and will add Lidoderm patches 8pm to 8am -3 of them 4. Mood: Provide emotional support  Celexa 10 mg daily started on 8/12             -antipsychotic agents: N/A 5. Neuropsych: This patient is capable of making decisions on his own behalf. 6. Skin/Wound Care: Routine skin checks 7. Fluids/Electrolytes/Nutrition: Routine in and outs 8.  Thrombocytopenia.  Resolved 9.  Neurogenic bowel bladder.  Foley catheter tube has been placed for multiple in and out catheterization Lidocaine jelly for bowel program.  10.  Elevated  PSA/enlarged prostate.  Family currently not committed to oncology work-up. 11. Depression and insomnia- Celexa 10 mg daily and melatonin 3 mg QHS prn.  12. Scrotal swelling- will elevate and Hospitalist is checking pro-BNP and giving a dose of Lasix.  Will monitor/follow.  13. Dysphagia- on D3 thin diet- will con't SLP for swallowing therapy.  14. Resp issues-  Flutter valve- also con't ICS if desired.  15. Leukocytosis-UTI  WBCs 12.6 on 8/13 Chest x-ray unremarkable for infectious process  UA positive, urine  culture sensitivities pending Exchange Foley  Empiric Macrobid started on 8/13  LOS: 2 days A FACE TO FACE EVALUATION WAS PERFORMED  [name] 12/15/2020, 11:40 AM

## 2020-12-15 NOTE — Progress Notes (Signed)
No allergies to iodine noted. 63F LF Foley catheter exchanged per MD Allena Katz order. Catheter inserted for prolonged urinary retention. Procedure and purpose explained to pt. Balloon inflated with 72mL of sterile saline. Pt denied discomfort with procedure. Peri care performed before and after procedure. Catheter secured to right upper thigh with stat lock. Drainage bag intact with tea colored urine returned. No kinks noted, bag below level of bladder and not touching floor. Pt resting comfortably, blow call bell in reach of pt. Family at bedside.  Mylo Red, LPN

## 2020-12-15 NOTE — Progress Notes (Signed)
MD Allena Katz informed of pt abdominal distention and gas. Pt describes "feeling bloated". New orders received. See MAR. Family also has concerns of pt coughing with oral fluid intake. Per MD Allena Katz, encourage supervision with oral intake by staff, encourage chin tuck, and small sips. Family updated. Mylo Red, LPN

## 2020-12-15 NOTE — Progress Notes (Signed)
Occupational Therapy Session Note  Patient Details  Name: [name] MRN: [id number] Date of Birth: [birth year]  Today's Date: 12/16/2020 OT Individual Time: 1431-1530 OT Individual Time Calculation (min): 59 min   Skilled Therapeutic Interventions/Progress Updates:    Pt greeted in bed, dressed and wearing his Teds. Agreeable to session. Started with education regarding autonomic dysreflexia as his foley catheter was filled with 620 ccs of urine. Also notified RN. Discussed causes of AD and preventive measures. Pt verbalized understanding. +2 for supine<sit. Worked on sitting balance/core strength during activity EOB. Pt pulling himself into more neutral posture with both arms (one at a time) due to posterior lean. Note that pt would often have anterior LOBs during weight shifts, needing Max A to correct. Education provided on diaphragmatic breathing strategies and coordinating breath with movement as well. Weightbearing/proximal UE strengthening with lateral leans on elbows and having pt assist with pushing himself up into neutral sitting. Discussed lateral leans in functional context of LB dressing/toileting and why such skills were important to build upon for BADL/transfers. We were about to set up slideboard transfer to the TIS when pt reported that he felt like he just had a BM. Pt returned to supine position and was found to have mildly soiled brief. +2 for perihygiene/brief change, pt rolling while hooking his arms through OT or RT to assist, Total A to position LEs appropriately. +2 also for boosting up in bed. Education initiated regarding pressure relief positioning in bed. He remained in bed at close of session, sip and puff set up, PRAFOs donned, and UEs supported with props.   Therapy Documentation Precautions:  Precautions Precautions: Back Precaution Comments: pathologic thoracic fracture- back precautions for safety Restrictions Weight Bearing Restrictions: No  Pain: in  shoulders/neck, pt reported raising his head really helped with this pain Pain Assessment Pain Score: 0-No pain ADL: ADL Upper Body Bathing: Dependent (+2) Where Assessed-Upper Body Bathing: Edge of bed Lower Body Bathing: Dependent (+2) Where Assessed-Lower Body Bathing: Bed level Upper Body Dressing: Dependent (+2) Where Assessed-Upper Body Dressing: Edge of bed Lower Body Dressing: Dependent (+2) Where Assessed-Lower Body Dressing: Bed level Toileting: Dependent (+2) Where Assessed-Toileting: Bed level Toilet Transfer: Not assessed Tub/Shower Transfer: Not assessed     Therapy/Group: Individual Therapy  Genavive Kubicki A Rettie Laird 12/16/2020, 4:11 PM

## 2020-12-16 DIAGNOSIS — Z7189 Other specified counseling: Secondary | ICD-10-CM

## 2020-12-16 DIAGNOSIS — Z515 Encounter for palliative care: Secondary | ICD-10-CM

## 2020-12-16 DIAGNOSIS — I639 Cerebral infarction, unspecified: Secondary | ICD-10-CM

## 2020-12-16 LAB — URINE CULTURE: Culture: >=100000 — AB

## 2020-12-16 NOTE — Progress Notes (Signed)
Physical Therapy Session Note  Patient Details  Name: [name] MRN: [id number] Date of Birth: Feb 10, 1946  Today's Date: 12/16/2020 PT Individual Time: 0800-0900; 1545-1630 PT Individual Time Calculation (min): 60 min and 45 min  Short Term Goals: Week 1:  PT Short Term Goal 1 (Week 1): Pt will tolerate at least  1 hr periods OOB in wc PT Short Term Goal 2 (Week 1): Pt will verbalize understanding of importance of and recite instructed  pressure relief schedule PT Short Term Goal 3 (Week 1): Pt will tolerate 10 min sitting balance activity prior to requiring supported rest breaks. PT Short Term Goal 4 (Week 1): Pt will participate in rolling via UEs and cervical motion. PT Short Term Goal 5 (Week 1): Pt will verbalize importance of rolling x 2 hrs to prevent pressure injuries  when in bed.  Skilled Therapeutic Interventions/Progress Updates:    Session 1: Pt received seated in bed having just finished eating breakfast and performing oral hygiene with assist from NT. No complaints of pain this AM, pt reports taking Tylenol prior to start of therapy session and has lidocaine patches in place on shoulders. Assisted pt with dependently donning pants,thigh high TEDs, and socks at bed level. Pt wearing PRAFOs in bed, encouraged patient to have family bring in tennis shoes to wear during the day while up in chair. Rolling L/R with total A x 2 during dressing. Semi-reclined BP 125/65. Supine to sit with total A x 2 for BLE management and trunk elevation with HOB slightly elevated. Seated BP 147/72 initially when sitting up. Session focus on sitting balance EOB with max to total A needed to maintain sitting balance. Pt has onset of R lateral lean with fatigue, unable to correct without max to total A. Seated anterior/posterior leans x 5 reps with total A to change position, attempt to have pt utilize head to initiate movement of trunk but pt with poor ability to maneuver his trunk and UB. Seated BP 113/75  by end of session, pt remains asymptomatic. Sit to supine total A x 2 for BLE management and trunk. Pt left semi-reclined in bed with needs in reach, sip/puff call button in reach, PRAFOs back in place, bed alarm in place.  Session 2: Pt received seated in bed, agreeable to PT session. No complaints of pain. Pt reports having an incontinent episode of BM prior to start of session, education with patient regarding bowel program and purpose of bowel program to prevent bowel accidents once body is regulated. Pt appreciative of education. Pt agreeable to get up to w/c this session. Supine to sit with total A x 2. Slide board transfer to TIS total A x 2. Pt reports difficulty breathing in fully upright position, much improved in semi-reclined position in TIS chair. Patient taken on tour of unit and discussed equipment that will be utilized during his rehab stay. Also discussed patient's home setup which appears to be very w/c accessible. Patient requests to return to bed at end of session. Slide board transfer w/c to bed with total A x 2. Sit to supine total A x 2 for BLE management and trunk control. Pt left seated in bed with sip/puff call button in reach, bed alarm in place.  Therapy Documentation Precautions:  Precautions Precautions: Back Precaution Comments: pathologic thoracic fracture- back precautions for safety Restrictions Weight Bearing Restrictions: No     Therapy/Group: Individual Therapy   Peter Congo, PT, DPT, CSRS  12/16/2020, 12:10 PM

## 2020-12-16 NOTE — IPOC Note (Signed)
Overall Plan of Care [facility]) Patient Details Name: [name] MRN: [id number] DOB: [birth year]  Admitting Diagnosis: Quadriplegia [facility])  Hospital Problems: Principal Problem:   Quadriplegia [facility]) Active Problems:   Neurogenic bowel   Neurogenic bladder   Scrotal swelling   Foley catheter in place on admission   Dysphagia   Acute lower UTI     Functional Problem List: Nursing Bladder, Bowel, Endurance, Medication Management, Pain, Skin Integrity  PT Balance, Safety, Sensory, Edema, Endurance, Motor, Pain  OT Balance, Endurance, Motor, Pain, Perception, Safety, Sensory, Skin Integrity  SLP Cognition  TR         Basic ADL's: OT Grooming, Bathing, Eating, Dressing, Toileting     Advanced  ADL's: OT       Transfers: PT Bed Mobility, Bed to Chair, Car, Oncologist: PT Wheelchair Mobility     Additional Impairments: OT Fuctional Use of Upper Extremity  SLP Communication, Swallowing, Social Cognition expression Memory  TR      Anticipated Outcomes Item Anticipated Outcome  Self Feeding Supervision  Swallowing  mod I   Basic self-care  Min - Mod A  Toileting  Mod A   Bathroom Transfers Min A  Bowel/Bladder  mod assist  Transfers  dependent  Locomotion  PWC use mod I w/adaptive equipment  Communication  Mod I  Cognition  Mod I  Pain  < 3  Safety/Judgment  mod assist and no falls   Therapy Plan: PT Intensity: Minimum of 1-2 x/day ,45 to 90 minutes PT Frequency: 5 out of 7 days PT Duration Estimated Length of Stay: 28days OT Intensity: Minimum of 1-2 x/day, 45 to 90 minutes OT Frequency: 5 out of 7 days OT Duration/Estimated Length of Stay: 24-28 days SLP Intensity: Minumum of 1-2 x/day, 30 to 90 minutes SLP Frequency: 1 to 3 out of 7 days SLP Duration/Estimated Length of Stay: 28 days   Due to the current state of emergency, patients may not be receiving their 3-hours of Medicare-mandated therapy.   Team  Interventions: Nursing Interventions Patient/Family Education, Bladder Management, Bowel Management, Disease Management/Prevention, Pain Management, Medication Management, Skin Care/Wound Management, Discharge Planning  PT interventions Community reintegration, DME/adaptive equipment instruction, Neuromuscular re-education, Psychosocial support, UE/LE Strength taining/ROM, Wheelchair propulsion/positioning, Warden/ranger, Discharge planning, Pain management, Skin care/wound management, Therapeutic Activities, UE/LE Coordination activities, Disease management/prevention, Functional mobility training, Patient/family education, Splinting/orthotics, Therapeutic Exercise  OT Interventions Balance/vestibular training, Discharge planning, Disease mangement/prevention, DME/adaptive equipment instruction, Functional electrical stimulation, Functional mobility training, Neuromuscular re-education, Pain management, Patient/family education, Psychosocial support, Self Care/advanced ADL retraining, Skin care/wound managment, Splinting/orthotics, Therapeutic Activities, Therapeutic Exercise, UE/LE Strength taining/ROM, UE/LE Coordination activities, Wheelchair propulsion/positioning  SLP Interventions Dysphagia/aspiration precaution training, Patient/family education, Internal/external aids, Other (comment)  TR Interventions    SW/CM Interventions Psychosocial Support, Patient/Family Education, Discharge Planning   Barriers to Discharge MD  Medical stability, Incontinence, Neurogenic bowel and bladder, and Lack of/limited family support  Nursing Decreased caregiver support, Home environment access/layout, Neurogenic Bowel & Bladder, Wound Care, Lack of/limited family support, Medication compliance, Pending chemo/radiation Patient lives with spouse in a 1 level home with level entry. Spouse has no limitations and will be available 24/7.  PT Inaccessible home environment, Incontinence, Weight, Other  (comments) new quadraplegia, accessibilty via PWC, extensive equipment and care needs anticipated  OT Incontinence, Neurogenic Bowel & Bladder, Decreased caregiver support    SLP      SW       Team Discharge Planning: Destination: PT-Home ,OT-  Home , SLP-Home Projected Follow-up: PT- , OT-  Home health OT, 24 hour supervision/assistance, SLP- (None anticipated, although TBD) Projected Equipment Needs: PT-To be determined, OT- Tub/shower bench, Other (comment) (drop arm BSC), SLP-None recommended by SLP Equipment Details: PT-will likely need hoyer lift, PWC, other TBD, OT-  Patient/family involved in discharge planning: PT- Patient,  OT-Patient, SLP-Patient  MD ELOS: 26-30 days. Medical Rehab Prognosis:  Fair Assessment: a 75 year old right-handed male unremarkable past medical history except he did have a history of spinal stenosis C7-T1 not requiring surgical intervention and on no prescription medications.  Independent prior to admission.  Presented 12/07/2020 with onset of progressive diffuse weakness pain across his neck/shoulders and inability to walk.  Urine drug screen was negative.  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  Neurology consulted Dr. 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  Dr.Xu felt that stroke to brain/spine likely  cardioembolic in nature and recommended DAPT as well as loop recorder to rule out atrial fibrillation .  Carotid Dopplers negative for ICA stenosis.  Bilateral extremity Dopplers negative.  Echocardiogram with ejection fraction of 60 to 65% no wall motion abnormalities.  Dr. Bertis Ruddy of hematology service consulted for input on pancytopenia question metastatic prostate cancer due to work-up revealing enlarged prostate and elevated PSA.  Bone scan with bone biopsy recommended for work-up of pancytopenia.  Family not interested in aggressive cancer work-up if patient did not have the ability to gain prior level of independence.  He was placed on aspirin Plavix for CVA prophylaxis x3 weeks total then aspirin alone.  Therapy evaluations completed due to patient's quadriparesis was admitted for a comprehensive rehab program. Will set goals for Min/Mod A with PT/OT and Mod I with SLP.     Pt c/o pain in shoulders, B/L- and posterior neck at night esp-- all day but worse at night and tylenol helps.  Also spoke to hospitalist about his new scrotal swelling- .    Admits he's been somewhat depressed since this occurred.     See Team Conference Notes for weekly updates to the plan of care

## 2020-12-16 NOTE — Progress Notes (Signed)
PROGRESS NOTE   Subjective/Complaints: Patient seen sitting up in bed this morning eating breakfast.  He states he slept well overnight.  He did gas yesterday afternoon, but notes significant improvement in comfort today after medications.  ROS: Denies CP, SOB, N/V/D  Objective:   DG CHEST PORT 1 VIEW  Result Date: 12/14/2020 CLINICAL DATA:  Leukocytosis EXAM: PORTABLE CHEST 1 VIEW COMPARISON:  12/07/2020 FINDINGS: Loop recorder device overlies the left chest wall. Heart size within normal limits. Atherosclerotic calcification of the aortic knob. Low lung volumes. Band-like opacities within the lung bases suggest atelectasis. Lungs appear otherwise clear. No pleural effusion or pneumothorax. IMPRESSION: Low lung volumes with bibasilar atelectasis. Electronically Signed   By: Duanne Guess D.O.   On: 12/14/2020 14:21   Recent Labs    12/14/20 0519 12/15/20 0620  WBC 11.2* 12.6*  HGB 12.4* 12.1*  HCT 33.9* 33.5*  PLT 152 169    Recent Labs    12/13/20 1856 12/14/20 0519  NA  --  135  K  --  3.7  CL  --  106  CO2  --  21*  GLUCOSE  --  96  BUN  --  18  CREATININE 0.84 0.81  CALCIUM  --  8.0*     Intake/Output Summary (Last 24 hours) at 12/16/2020 0825 Last data filed at 12/16/2020 3662 Gross per 24 hour  Intake 1200 ml  Output 2250 ml  Net -1050 ml         Physical Exam: Vital Signs Blood pressure (!) 148/73, pulse (!) 58, temperature 98.7 F (37.1 C), temperature source Oral, resp. rate 18, height 5' 8.5" (1.74 m), weight 73.2 kg, SpO2 100 %. Constitutional: No distress . Vital signs reviewed. HENT: Normocephalic.  Atraumatic. Eyes: EOMI. No discharge. Cardiovascular: No JVD.  RRR. Respiratory: Normal effort.  No stridor.  Bilateral clear to auscultation. GI: Non-distended.  BS +. GU: + Foley. Skin: Warm and dry.  Intact. Psych: Normal mood.  Normal behavior. Musc: No edema in extremities.  No  tenderness in extremities. Neuro: Alert Motor RUE: biceps 4/5, WE 3-/5, triceps 2-/5, grip 2-/5, finger abd 0/5, stable LUE: biceps 4/5, WE 3-/5, triceps 0/5, grip 1/5, and finger abd 0/5, stable LE's 0/5 in HF, KE, DF, APF   Assessment/Plan: 1. Functional deficits which require 3+ hours per day of interdisciplinary therapy in a comprehensive inpatient rehab setting. Physiatrist is providing close team supervision and 24 hour management of active medical problems listed below. Physiatrist and rehab team continue to assess barriers to discharge/monitor patient progress toward functional and medical goals  Care Tool:  Bathing        Body parts bathed by helper: Right arm, Left arm, Chest, Abdomen, Front perineal area, Buttocks, Right upper leg, Left upper leg, Right lower leg, Left lower leg, Face     Bathing assist Assist Level: 2 Helpers     Upper Body Dressing/Undressing Upper body dressing   What is the patient wearing?: Pull over shirt    Upper body assist Assist Level: 2 Helpers    Lower Body Dressing/Undressing Lower body dressing      What is the patient wearing?: Incontinence brief  Lower body assist Assist for lower body dressing: 2 Helpers     Toileting Toileting    Toileting assist Assist for toileting: 2 Helpers     Transfers Chair/bed transfer  Transfers assist  Chair/bed transfer activity did not occur: Safety/medical concerns  Chair/bed transfer assist level: 2 Helpers     Locomotion Ambulation   Ambulation assist   Ambulation activity did not occur: Safety/medical concerns          Walk 10 feet activity   Assist  Walk 10 feet activity did not occur: Safety/medical concerns        Walk 50 feet activity   Assist Walk 50 feet with 2 turns activity did not occur: Safety/medical concerns         Walk 150 feet activity   Assist Walk 150 feet activity did not occur: Safety/medical concerns         Walk 10 feet on  uneven surface  activity   Assist Walk 10 feet on uneven surfaces activity did not occur: Safety/medical concerns         Wheelchair     Assist Will patient use wheelchair at discharge?: Yes Type of Wheelchair: Power Wheelchair activity did not occur: Safety/medical concerns (limited tolerance for upright)         Wheelchair 50 feet with 2 turns activity    Assist    Wheelchair 50 feet with 2 turns activity did not occur: Safety/medical concerns       Wheelchair 150 feet activity     Assist  Wheelchair 150 feet activity did not occur: Safety/medical concerns       Blood pressure (!) 148/73, pulse (!) 58, temperature 98.7 F (37.1 C), temperature source Oral, resp. rate 18, height 5' 8.5" (1.74 m), weight 73.2 kg, SpO2 100 %.  Medical Problem List and Plan: 1.  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  Status post plan loop recorder  Continue CIR 2.  Antithrombotics: -DVT/anticoagulation: Venous Doppler studies negative.  Continue [employer]: Lovenox             -antiplatelet therapy: Aspirin 81 mg daily and Plavix 75 mg day x3 weeks and aspirin alone 3. Pain Management: Tylenol and will add Lidoderm patches 8pm to 8am -3 of them 4. Mood: Provide emotional support  Celexa 10 mg daily started on 8/12             -antipsychotic agents: N/A 5. Neuropsych: This patient is capable of making decisions on his own behalf. 6. Skin/Wound Care: Routine skin checks 7. Fluids/Electrolytes/Nutrition: Routine in and outs 8.  Thrombocytopenia.  Resolved 9.  Neurogenic bowel bladder.  Foley catheter tube has been placed for multiple in and out catheterization Lidocaine jelly for bowel program.   10.  Elevated PSA/enlarged prostate.  Family currently not committed to oncology work-up. 11. Depression and insomnia- Celexa 10 mg daily and melatonin 3 mg QHS prn.  12. Scrotal swelling- will elevate and Hospitalist is checking pro-BNP and giving a dose of Lasix.  Will monitor/follow.  13. Dysphagia- on D3 thin diet- will con't SLP for swallowing therapy.  14. Resp issues-  Flutter valve- also con't ICS if desired.  15. Leukocytosis-UTI  WBCs 12.6 on 8/13 Chest x-ray unremarkable for infectious process  UA positive, urine culture E. coli pansensitive  Exchange Foley  Continue Macrobid started on 8/13 16.  Labile blood pressure  Monitor for trend  LOS: 3 days A FACE TO FACE EVALUATION WAS PERFORMED  Aleja Yearwood Karis Juba 12/16/2020, 8:25 AM

## 2020-12-16 NOTE — Progress Notes (Signed)
Speech Language Pathology Daily Session Note  Patient Details  Name: [name] MRN: [id number] Date of Birth: 1945/05/25  Today's Date: 12/16/2020 SLP Individual Time: 1020-1103 SLP Individual Time Calculation (min): 43 min  Short Term Goals: Week 1: SLP Short Term Goal 1 (Week 1): Patient will implement breath support techniques to increase vocal intensity and effectiveness at sentence level with sup A cues SLP Short Term Goal 2 (Week 1): Patient will tolerate current diet without overt s/sx of aspiration with sup A verbal cues to implement safe swallow precautions SLP Short Term Goal 3 (Week 1): Patient will demonstrate effecient mastication and oral clearance of regular textures over 2 observed sessions to demonstrate readiness for diet advancement SLP Short Term Goal 4 (Week 1): Patient will complete complex problem solving with sup A verbal cues  Skilled Therapeutic Interventions:  Pt was seen for skilled ST targeting goals for communication.  Pt was awake, alert, and agreeable to participating in treatment upon therapist's arrival.  Pt reports regular completion of his prescribed exercises using the incentive spirometer (from acute ST notes) with subsequent decrease in effort required to complete repetitions and increased satisfaction in his overall speech intelligibility, citing specifically being able to talk for longer periods of time at a louder volume.  Pt was able to complete 10 repetitions with a targeted goal of 1250-1500 on the incentive spirometer with a self perceived effort level of 6/10 (reduced from 8/10 during last targeted treatment session).  Given his decreased effort level reported on incentive spirometry, I recommend proceeding with RMST during next available ST appointment.  Pt was able to maintain adequate vocal intensity for intelligibility during conversations with SLP in a quiet environment with mod I.  Pt was left in bed with bed alarm set and call bell within reach.   Continue per current plan of care.    Pain Pain Assessment Pain Scale: 0-10 Pain Score: 0-No pain  Therapy/Group: Individual Therapy  Philomene Haff, Melanee Spry 12/16/2020, 12:03 PM
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
## 2020-12-17 LAB — CBC WITH DIFFERENTIAL/PLATELET
Abs Immature Granulocytes: 0.09 10*3/uL — ABNORMAL HIGH (ref 0.00–0.07)
Basophils Absolute: 0 10*3/uL (ref 0.0–0.1)
Basophils Relative: 0 %
Eosinophils Absolute: 0.1 10*3/uL (ref 0.0–0.5)
Eosinophils Relative: 1 %
HCT: 35.2 % — ABNORMAL LOW (ref 39.0–52.0)
Hemoglobin: 12.3 g/dL — ABNORMAL LOW (ref 13.0–17.0)
Immature Granulocytes: 1 %
Lymphocytes Relative: 8 %
Lymphs Abs: 0.6 10*3/uL — ABNORMAL LOW (ref 0.7–4.0)
MCH: 31.6 pg (ref 26.0–34.0)
MCHC: 34.9 g/dL (ref 30.0–36.0)
MCV: 90.5 fL (ref 80.0–100.0)
Monocytes Absolute: 1 10*3/uL (ref 0.1–1.0)
Monocytes Relative: 13 %
Neutro Abs: 5.7 10*3/uL (ref 1.7–7.7)
Neutrophils Relative %: 77 %
Platelets: 224 10*3/uL (ref 150–400)
RBC: 3.89 MIL/uL — ABNORMAL LOW (ref 4.22–5.81)
RDW: 12.6 % (ref 11.5–15.5)
WBC: 7.4 10*3/uL (ref 4.0–10.5)
nRBC: 0 % (ref 0.0–0.2)

## 2020-12-17 LAB — BASIC METABOLIC PANEL
Anion gap: 7 (ref 5–15)
BUN: 13 mg/dL (ref 8–23)
CO2: 24 mmol/L (ref 22–32)
Calcium: 8.2 mg/dL — ABNORMAL LOW (ref 8.9–10.3)
Chloride: 106 mmol/L (ref 98–111)
Creatinine, Ser: 0.67 mg/dL (ref 0.61–1.24)
GFR, Estimated: >60 mL/min (ref >60)
Glucose, Bld: 98 mg/dL (ref 70–99)
Potassium: 3.6 mmol/L (ref 3.5–5.1)
Sodium: 137 mmol/L (ref 135–145)

## 2020-12-17 MED ORDER — BISACODYL 10 MG RE SUPP
10.0000 mg | Freq: Every day | RECTAL | Status: DC
  Administered 2020-12-17 – 2021-01-09 (×24): 10 mg via RECTAL
  Filled 2020-12-17 (×25): qty 1

## 2020-12-17 NOTE — Care Management (Signed)
Inpatient Rehabilitation Center Individual Statement of Services  Patient Name:  Ansel Ferrall  Date:  12/17/2020  Welcome to the Inpatient Rehabilitation Center.  Our goal is to provide you with an individualized program based on your diagnosis and situation, designed to meet your specific needs.  With this comprehensive rehabilitation program, you will be expected to participate in at least 3 hours of rehabilitation therapies Monday-Friday, with modified therapy programming on the weekends.  Your rehabilitation program will include the following services:  Physical Therapy (PT), Occupational Therapy (OT), Speech Therapy (ST), 24 hour per day rehabilitation nursing, Therapeutic Recreaction (TR), Psychology, Neuropsychology, Care Coordinator, Rehabilitation Medicine, Nutrition Services, Pharmacy Services, and Other  Weekly team conferences will be held on Tuesdays to discuss your progress.  Your Inpatient Rehabilitation Care Coordinator will talk with you frequently to get your input and to update you on team discussions.  Team conferences with you and your family in attendance may also be held.  Expected length of stay: 24-28 days    Overall anticipated outcome: Moderate Assistance to [facility]  Depending on your progress and recovery, your program may change. Your Inpatient Rehabilitation Care Coordinator will coordinate services and will keep you informed of any changes. Your Inpatient Rehabilitation Care Coordinator's name and contact numbers are listed  below.  The following services may also be recommended but are not provided by the Inpatient Rehabilitation Center:  Driving Evaluations Home Health Rehabiltiation Services Outpatient Rehabilitation Services Vocational Rehabilitation   Arrangements will be made to provide these services after discharge if needed.  Arrangements include referral to agencies that provide these services.  Your insurance has been verified to be:  Medicare  A/B  Your primary doctor is:  No PCP  Pertinent information will be shared with your doctor and your insurance company.  Inpatient Rehabilitation Care Coordinator:  Susie Cassette [phone] or ([phone]  Information discussed with and copy given to patient by: Gretchen Short, 12/17/2020, 3:56 PM

## 2020-12-17 NOTE — Progress Notes (Signed)
Pt stated feeling very weak and he is experiencing heaviness in his eyelids. Pt not in pain, vitals are within normal limits, lung, heart and bowel sounds within normal limits for pt Notified PA, Dan. Will continue to monitor.  Loleta Dicker LPN

## 2020-12-17 NOTE — Progress Notes (Signed)
Occupational Therapy Session Note  Patient Details  Name: [name] MRN: [id number] Date of Birth: 1945/09/10  Today's Date: 12/17/2020 OT Individual Time: 1045-1200 OT Individual Time Calculation (min): 75 min    Short Term Goals: Week 1:  OT Short Term Goal 1 (Week 1): Pt will maintain static sitting balance 2 mins with min assist in preparation for self-care tasks. OT Short Term Goal 2 (Week 1): Pt will assist in UB bathing with AE as needed to complete UB bathing with mod assist OT Short Term Goal 3 (Week 1): Pt will engage in rolling with max assist of one caregiver to decrease burden of care with toileting  Skilled Therapeutic Interventions/Progress Updates:    Pt resting in bed upon arrival. OT intervention with focus on bed mobility, sitting balance, directing care, activity tolerance, and safety awareness to increase independence with BADLs. Pt incontinent of bowel but unaware. Dependent for hygiene and changing brief. Max A+2 for rolling in bed. Pt initiates moving UE to assist but dependent for BLE positioning. Dependent for donning pants. Supine>sit EOB with max A+2. Sitting balance EOB with max A+2. Tot A+2 for donning pullover shirt. Pt initiates threading BUE into sleeves and reaching to pull over trunk. Pt returned to supine with tot A+2. Dependent for repositioning. Pt able to direct placement of pillows and AE. Pt remained in bed with all needs within reach and bed alarm activated.   Therapy Documentation Precautions:  Precautions Precautions: Back Precaution Comments: pathologic thoracic fracture- back precautions for safety Restrictions Weight Bearing Restrictions: No  Pain:  I'm ok; "I took some tylenol before therapy"   Therapy/Group: Individual Therapy  Rich Brave 12/17/2020, 12:13 PM

## 2020-12-17 NOTE — Progress Notes (Signed)
PROGRESS NOTE   Subjective/Complaints:  Pt reports using sip and puff- to call nursing- also using assistive device to call family- Much better.  Said moving better since Baclofen started.  Sleeping better last night.   Drinks a lot since mouth dry a lot.  Nurses turning him ~ q4 hours- wil change to q2 hours- q2-3 hours when asleep.   Wife concerned about swelling in hands- L>R due to IV?   ROS:  Pt denies SOB, abd pain, CP, N/V/C/D, and vision changes   Objective:   No results found. Recent Labs    12/15/20 0620 12/17/20 0736  WBC 12.6* 7.4  HGB 12.1* 12.3*  HCT 33.5* 35.2*  PLT 169 224   Recent Labs    12/17/20 0736  NA 137  K 3.6  CL 106  CO2 24  GLUCOSE 98  BUN 13  CREATININE 0.67  CALCIUM 8.2*    Intake/Output Summary (Last 24 hours) at 12/17/2020 0837 Last data filed at 12/17/2020 0658 Gross per 24 hour  Intake 600 ml  Output 2500 ml  Net -1900 ml        Physical Exam: Vital Signs Blood pressure (!) 156/71, pulse 62, temperature 99.4 F (37.4 C), temperature source Oral, resp. rate 19, height 5' 8.5" (1.74 m), weight 73.2 kg, SpO2 97 %.    General: awake, alert, appropriate, sitting up at 45 degrees in bed- wife at bedside; NAD HENT: conjugate gaze; oropharynx moist CV: regular rate; no JVD Pulmonary: CTA B/L; no W/R/R- good air movement GI: soft, NT, ND, (+)BS; hypoactive Psychiatric: appropriate; slightly brighter Neurological: alert MAS of 1 in LUE and 0 in RUE this AM- better with Baclofen GU: + Foley. Skin: Warm and dry.  Intact. Musc: No edema in extremities.  No tenderness in extremities. Neuro: Alert Motor RUE: biceps 4/5, WE 3-/5, triceps 2-/5, grip 2-/5, finger abd 0/5, stable LUE: biceps 4/5, WE 3-/5, triceps 0/5, grip 1/5, and finger abd 0/5, stable LE's 0/5 in HF, KE, DF, APF   Assessment/Plan: 1. Functional deficits which require 3+ hours per day of  interdisciplinary therapy in a comprehensive inpatient rehab setting. Physiatrist is providing close team supervision and 24 hour management of active medical problems listed below. Physiatrist and rehab team continue to assess barriers to discharge/monitor patient progress toward functional and medical goals  Care Tool:  Bathing        Body parts bathed by helper: Right arm, Left arm, Chest, Abdomen, Front perineal area, Buttocks, Right upper leg, Left upper leg, Right lower leg, Left lower leg, Face     Bathing assist Assist Level: 2 Helpers     Upper Body Dressing/Undressing Upper body dressing   What is the patient wearing?: Pull over shirt    Upper body assist Assist Level: 2 Helpers    Lower Body Dressing/Undressing Lower body dressing      What is the patient wearing?: Incontinence brief     Lower body assist Assist for lower body dressing: 2 Helpers     Toileting Toileting    Toileting assist Assist for toileting: 2 Helpers     Transfers Chair/bed transfer  Transfers assist  Chair/bed transfer activity did  not occur: Safety/medical concerns  Chair/bed transfer assist level: 2 Helpers     Locomotion Ambulation   Ambulation assist   Ambulation activity did not occur: Safety/medical concerns          Walk 10 feet activity   Assist  Walk 10 feet activity did not occur: Safety/medical concerns        Walk 50 feet activity   Assist Walk 50 feet with 2 turns activity did not occur: Safety/medical concerns         Walk 150 feet activity   Assist Walk 150 feet activity did not occur: Safety/medical concerns         Walk 10 feet on uneven surface  activity   Assist Walk 10 feet on uneven surfaces activity did not occur: Safety/medical concerns         Wheelchair     Assist Will patient use wheelchair at discharge?: Yes Type of Wheelchair: Power Wheelchair activity did not occur: Safety/medical concerns (limited  tolerance for upright)         Wheelchair 50 feet with 2 turns activity    Assist    Wheelchair 50 feet with 2 turns activity did not occur: Safety/medical concerns       Wheelchair 150 feet activity     Assist  Wheelchair 150 feet activity did not occur: Safety/medical concerns       Blood pressure (!) 156/71, pulse 62, temperature 99.4 F (37.4 C), temperature source Oral, resp. rate 19, height 5' 8.5" (1.74 m), weight 73.2 kg, SpO2 97 %.  Medical Problem List and Plan: 1.  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  Status post plan loop recorder  Con't PT and OT- CIR; turn q2-3 hours to reduce risk of pressure ulcers.  2.  Antithrombotics: -DVT/anticoagulation: Venous Doppler studies negative.  Continue Lovenox Pharmaceutical: Lovenox             -antiplatelet therapy: Aspirin 81 mg daily and Plavix 75 mg day x3 weeks and aspirin alone 3. Pain Management: Tylenol and will add Lidoderm patches 8pm to 8am -3 of them 4. Mood: Provide emotional support  Celexa 10 mg daily started on 8/12             -antipsychotic agents: N/A 5. Neuropsych: This patient is capable of making decisions on his own behalf. 6. Skin/Wound Care: Routine skin checks 7. Fluids/Electrolytes/Nutrition: Routine in and outs 8.  Thrombocytopenia.  Resolved 9.  Neurogenic bowel bladder.  Foley catheter tube has been placed for multiple in and out catheterization Lidocaine jelly for bowel program.  8/15- con't Foley due to enlarged scrotum- would be hard to cath- likely won't void- also- started bowel program- with suppository- wife asked [place] to try without dig stim- I ordered if needed.  10.  Elevated PSA/enlarged prostate.  Family  currently not committed to oncology work-up.  Family has asked [place] to not discuss cancer possibility while here-  11. Depression and insomnia- Celexa 10 mg daily and melatonin 3 mg QHS prn.  12. Scrotal swelling- will elevate and Hospitalist is checking pro-BNP and giving a dose of Lasix.  Will monitor/follow.   8/15- slightly better- con't regimen 13. Dysphagia- on D3 thin diet- will con't SLP for swallowing therapy.  14. Resp issues-  Flutter valve- also con't ICS  if desired.  15. Leukocytosis-UTI  WBCs 12.6 on 8/13 Chest x-ray unremarkable for infectious process  UA positive, urine culture E. coli pansensitive Exchange Foley  Continue Macrobid started on 8/13  8/15- WBC back down to baseline-  16.  Labile blood pressure  Monitor for trend  8/15- BP 150s/70s- is this AD?? Might be why labile? It's a little early, but not impossible.  17. L>R hand swelling  8/15- elevate, but also d/c L hand IV  LOS: 4 days A FACE TO FACE EVALUATION WAS PERFORMED  Oliviya Gilkison 12/17/2020, 8:37 AM

## 2020-12-17 NOTE — Progress Notes (Signed)
Speech Language Pathology Daily Session Note  Patient Details  Name: Airrion Otting MRN: [id number] Date of Birth: 12-03-1945  Today's Date: 12/17/2020 SLP Individual Time: 1345-1420 SLP Individual Time Calculation (min): 35 min  Short Term Goals: Week 1: SLP Short Term Goal 1 (Week 1): Patient will implement breath support techniques to increase vocal intensity and effectiveness at sentence level with sup A cues SLP Short Term Goal 2 (Week 1): Patient will tolerate current diet without overt s/sx of aspiration with sup A verbal cues to implement safe swallow precautions SLP Short Term Goal 3 (Week 1): Patient will demonstrate effecient mastication and oral clearance of regular textures over 2 observed sessions to demonstrate readiness for diet advancement SLP Short Term Goal 4 (Week 1): Patient will complete complex problem solving with sup A verbal cues  Skilled Therapeutic Interventions:Skilled ST services focused on cognitive skills. Pt expressed fatigue and politely declined RMT assessment today. SLP facilitated complex problem solving skills in money management task, pt was able to dictate adding and subtracting with supervision A verbal cues. Pt consumed thin liquids via straw requesting to remain at 50 degrees in bed and initially demonstrated mild throat clear, but was not noted on continued trials SLP provided education to sit at 90 degrees during PO consumption. Pt expressed feeling strange, exhausted, weak and reported a "fluttering feeling." Pt politely requested to end ST services. SLP requested NT to take vital signs and notified nurse directly. Pt missed 10 minutes of treatment due to fatigue. Pt was left in room with call bell within reach and bed alarm set. SLP recommends to continue skilled services.     Pain Pain Assessment Pain Score: 0-No pain  Therapy/Group: Individual Therapy  Joshoa Shawler  [facility] 12/17/2020, 2:28 PM

## 2020-12-17 NOTE — Progress Notes (Signed)
Physical Therapy Session Note  Patient Details  Name: [name] MRN: [id number] Date of Birth: 12/21/1945  Today's Date: 12/17/2020 PT Individual Time: 1430-1440; 0626-9485 PT Individual Time Calculation (min): 10 min and 55 min  Short Term Goals: Week 1:  PT Short Term Goal 1 (Week 1): Pt will tolerate at least  1 hr periods OOB in wc PT Short Term Goal 2 (Week 1): Pt will verbalize understanding of importance of and recite instructed  pressure relief schedule PT Short Term Goal 3 (Week 1): Pt will tolerate 10 min sitting balance activity prior to requiring supported rest breaks. PT Short Term Goal 4 (Week 1): Pt will participate in rolling via UEs and cervical motion. PT Short Term Goal 5 (Week 1): Pt will verbalize importance of rolling x 2 hrs to prevent pressure injuries  when in bed.  Skilled Therapeutic Interventions/Progress Updates:    Session 1: Pt received seated in bed. Pt reports feeling very fatigued this AM and drained of energy. Assisted patient with repositioning in bed for comfort. Pt agreeable to resting at this time and therapist returning after one hour. Pt left seated in bed with needs in reach.  Session 2: Pt received seated in bed after an hour and agreeable to therapy session. No complaints of pain. Pt reports he continues to feel drained of energy but this improves throughout session. Pt declines any OOB mobility or sitting balance this session, agreeable to bed level PROM. Session focus on PROM to BLE and BUE in available planes of motion to maintain joint ROM and prevent contracture. Pt left seated in bed with needs in reach at end of session.  Therapy Documentation Precautions:  Precautions Precautions: Back Precaution Comments: pathologic thoracic fracture- back precautions for safety Restrictions Weight Bearing Restrictions: No   Therapy/Group: Individual Therapy   Peter Congo, PT, DPT, CSRS  12/17/2020, 5:29 PM

## 2020-12-18 DIAGNOSIS — N5089 Other specified disorders of the male genital organs: Secondary | ICD-10-CM

## 2020-12-18 DIAGNOSIS — R1312 Dysphagia, oropharyngeal phase: Secondary | ICD-10-CM

## 2020-12-18 MED ORDER — SALINE SPRAY 0.65 % NA SOLN
1.0000 | NASAL | Status: DC | PRN
  Administered 2020-12-19: 1 via NASAL
  Filled 2020-12-18: qty 44

## 2020-12-18 NOTE — Patient Care Conference (Signed)
Inpatient RehabilitationTeam Conference and Plan of Care Update Date: 12/18/2020   Time: 11:11 AM    Patient Name: [name]      Medical Record Number: [id number]  Date of Birth: 10/05/1945 Sex: Male         Room/Bed: 4M08C/4M08C-01 Payor Info: Payor: MEDICARE / Plan: MEDICARE PART A AND B / Product Type: *No Product type* /    Admit Date/Time:  12/13/2020  6:41 PM  Primary Diagnosis:  Quadriplegia [facility])  Hospital Problems: Principal Problem:   Quadriplegia [facility]) Active Problems:   Neurogenic bowel   Neurogenic bladder   Scrotal swelling   Foley catheter in place on admission   Dysphagia   Acute lower UTI    Expected Discharge Date: Expected Discharge Date: 01/10/21  Team Members Present: Physician leading conference: Dr. Faith Rogue Social Worker Present: Cecile Sheerer, LCSWA Nurse Present: Kennyth Arnold, RN PT Present: Peter Congo, PT OT Present: Ardis Rowan, COTA;Jennifer Katrinka Blazing, OT SLP Present: Colin Benton, SLP PPS Coordinator present : Fae Pippin, SLP     Current Status/Progress Goal Weekly Team Focus  Bowel/Bladder   Pt has foley in place and incont of Bowel. LBM 12/17/20.  Pt gain cont of B/B.  Toliet pt as needed.   Swallow/Nutrition/ Hydration   dys 3 textures and thin liquids, min-supervision A  Mod I  swallow strategies and regular texture trials   ADL's   bathing-dependent (night bath); UB dressing-tot A; LB dressing-dependent; bed mobility-tot A+2; sitting balance-tot A+2  mod A overall  bed mobility, sitting balance, BADL retraining, activity tolerance, education   Mobility   total A x 2 for rolling, bed mobility, SB transfers; tolerates sitting up in TIS x 15 min  total A transfers, mod I PWC mobility  sitting balance, OOB tolerance, SCI [employer]   conversation, reduced vocal intensity  Mod I  speech strategies, RMT assessment   Safety/Cognition/ Behavioral Observations  min-supervision A  Mod I  complex problem solving    Pain   Pt dosent c/o pain  Pt to remain pain free or comfortbale at a pain scale < 3  Assess for pain qshift and treat as needed   Skin   Pt is skin is clean and intact with no s/s of breakdown  Pt skin to remain intact  Skin care each shift     Discharge Planning:  To be assessed; Per EMR, pt will d/c to home with 24/7 care from pt wife. SW will confirm no barriers to d/c.   Team Discussion: C5, ASIA-B, tetraplegia, neurogenic bowel/bladder, continue foley, Macrobid for +UTI tx complete. Continue bowel program, Tylenol effective. Total assist +2 bed mobility, slide board transfers. Tolerated sitting up in tilt-n-space chair between therapy sessions. Using U-cuff to brush teeth, wash face. SLP reports swallowing strategies, RMT, and complex problem solving. Patient on target to meet rehab goals: yes  *See Care Plan and progress notes for long and short-term goals.   Revisions to Treatment Plan:  Continue foley for management of scrotal edema.  Teaching Needs: Family education, medication management, pain management, bowel/bladder management, transfer training, slide board training, balance training, endurance training, safety awareness.  Current Barriers to Discharge: Decreased caregiver support, Medical stability, Home enviroment access/layout, Neurogenic bowel and bladder, Lack of/limited family support, Weight bearing restrictions, and Medication compliance  Possible Resolutions to Barriers: Continue current medications, provide emotional support.     Medical Summary Current Status: C5 SCI ASIA B.  pain improved. foley in for scrotal edema, neurogenic bladder.  neurogenic bowel  Barriers to Discharge: Medical stability   Possible Resolutions to Barriers/Weekly Focus: bowel and bladder mgt considerations. daily assessment of labs and pt data   Continued Need for Acute Rehabilitation Level of Care: The patient requires daily medical management by a physician with specialized  training in physical medicine and rehabilitation for the following reasons: Direction of a multidisciplinary physical rehabilitation program to maximize functional independence : Yes Medical management of patient stability for increased activity during participation in an intensive rehabilitation regime.: Yes Analysis of laboratory values and/or radiology reports with any subsequent need for medication adjustment and/or medical intervention. : Yes   I attest that I was present, lead the team conference, and concur with the assessment and plan of the team.   Tennis Must 12/18/2020, 4:12 PM

## 2020-12-18 NOTE — Progress Notes (Signed)
Speech Language Pathology Daily Session Note  Patient Details  Name: Coal Nearhood MRN: [id number] Date of Birth: 19-Apr-1946  Today's Date: 12/18/2020 SLP Individual Time: 1345-1420 SLP Individual Time Calculation (min): 35 min  Short Term Goals: Week 1: SLP Short Term Goal 1 (Week 1): Patient will implement breath support techniques to increase vocal intensity and effectiveness at sentence level with sup A cues SLP Short Term Goal 2 (Week 1): Patient will tolerate current diet without overt s/sx of aspiration with sup A verbal cues to implement safe swallow precautions SLP Short Term Goal 3 (Week 1): Patient will demonstrate effecient mastication and oral clearance of regular textures over 2 observed sessions to demonstrate readiness for diet advancement SLP Short Term Goal 4 (Week 1): Patient will complete complex problem solving with sup A verbal cues  Skilled Therapeutic Interventions: Patient agreeable to skilled ST intervention with focus on speech and swallowing goals. Pt appeared in good spirits today and reported feeling more energized. He has been utilizing [employer] voice commands to access his phone to call his family without difficulty. Pt exhibited improved vocal intensity resulting in increased utterance length and less instances of running out of air. He was observed verbalizing an average of 9-10 words per breath as compared to approximately 3-4 words at eval. Patient elevated to approximately 75 degrees (unable to tolerate 90 degrees due to discomfort) for thin liquid straw sips. Throat clearing was observed x1 however no additional pharyngeal symptom were observed with approximately 3-4 oz of thin liquid. SLP encouraged single sips at a time vs. sequential. Patient utilized incentive spirometer x10 by achieving 1500 during first 2 reps and between 1250-1400 during the remaining 8 reps with perceived effort as 8/10. Patient was left in bed with alarm activated and immediate needs within  reach at end of session. Continue per current plan of care.      Pain Pain Assessment Pain Scale: 0-10 Pain Score: 0-No pain  Therapy/Group: Individual Therapy  Tamala Ser 12/18/2020, 3:57 PM

## 2020-12-18 NOTE — Progress Notes (Signed)
Patient ID: [name], male   DOB: 1945-11-01, 75 y.o.   MRN: [id number]  SW met with pt in room to provide updates from team conference, and d/c date 9/8. Pt called his wife [name] while in the room. SW discussed d/c plan. Ultimately plan is for pt to discharge to home, however, concerned about ability to provide care needs given what care needs he may require. Sw to provide sitter list and HHA list. SW completed assessment.  *SW gave HHA list and sitter list to pt dtr who was in the room.   [name], MSW, [place] Office: [phone] Cell: [phone] Fax: [phone]

## 2020-12-18 NOTE — Progress Notes (Signed)
PROGRESS NOTE   Subjective/Complaints:  Talked about how voice control is set up on phone. Needs longer power cord. Denies any current pain.   ROS: Patient denies fever, rash, sore throat, blurred vision, nausea, vomiting, diarrhea, cough, shortness of breath or chest pain, joint or back pain, headache, or mood change.    Objective:   No results found. Recent Labs    12/17/20 0736  WBC 7.4  HGB 12.3*  HCT 35.2*  PLT 224   Recent Labs    12/17/20 0736  NA 137  K 3.6  CL 106  CO2 24  GLUCOSE 98  BUN 13  CREATININE 0.67  CALCIUM 8.2*    Intake/Output Summary (Last 24 hours) at 12/18/2020 1212 Last data filed at 12/18/2020 0849 Gross per 24 hour  Intake 360 ml  Output 2150 ml  Net -1790 ml        Physical Exam: Vital Signs Blood pressure (!) 174/83, pulse 69, temperature 99.3 F (37.4 C), temperature source Oral, resp. rate 14, height 5' 8.5" (1.74 m), weight 73.2 kg, SpO2 96 %.    Constitutional: No distress . Vital signs reviewed. HEENT: NCAT, EOMI, oral membranes moist Neck: supple Cardiovascular: RRR without murmur. No JVD    Respiratory/Chest: CTA Bilaterally without wheezes or rales. Normal effort    GI/Abdomen: BS +, non-tender, non-distended Ext: no clubbing, cyanosis, or edema Psych: pleasant and cooperative, quiet  Neurological: alert MAS of 1 in LUE and 0 in RUE this AM--stable 8/16 GU: + Foley. Skin: Warm and dry.  Intact. Musc: No edema in extremities.  No tenderness in extremities. Neuro: Alert Motor RUE: biceps 4/5, WE 3-/5, triceps 2-/5, grip 2-/5, finger abd 0/5, stable LUE: biceps 4/5, WE 3-/5, triceps 0/5, grip 1/5, and finger abd 0/5,stable LE's 0/5 in HF, KE, DF, APF. Senses gross touch in both legs.   Assessment/Plan: 1. Functional deficits which require 3+ hours per day of interdisciplinary therapy in a comprehensive inpatient rehab setting. Physiatrist is providing close  team supervision and 24 hour management of active medical problems listed below. Physiatrist and rehab team continue to assess barriers to discharge/monitor patient progress toward functional and medical goals  Care Tool:  Bathing        Body parts bathed by helper: Right arm, Left arm, Chest, Abdomen, Front perineal area, Buttocks, Right upper leg, Left upper leg, Right lower leg, Left lower leg, Face     Bathing assist Assist Level: 2 Helpers     Upper Body Dressing/Undressing Upper body dressing   What is the patient wearing?: Pull over shirt    Upper body assist Assist Level: 2 Helpers    Lower Body Dressing/Undressing Lower body dressing      What is the patient wearing?: Incontinence brief     Lower body assist Assist for lower body dressing: 2 Helpers     Toileting Toileting    Toileting assist Assist for toileting: 2 Helpers     Transfers Chair/bed transfer  Transfers assist  Chair/bed transfer activity did not occur: Safety/medical concerns  Chair/bed transfer assist level: 2 Helpers     Locomotion Ambulation   Ambulation assist   Ambulation activity did not  occur: Safety/medical concerns          Walk 10 feet activity   Assist  Walk 10 feet activity did not occur: Safety/medical concerns        Walk 50 feet activity   Assist Walk 50 feet with 2 turns activity did not occur: Safety/medical concerns         Walk 150 feet activity   Assist Walk 150 feet activity did not occur: Safety/medical concerns         Walk 10 feet on uneven surface  activity   Assist Walk 10 feet on uneven surfaces activity did not occur: Safety/medical concerns         Wheelchair     Assist Will patient use wheelchair at discharge?: Yes Type of Wheelchair: Power Wheelchair activity did not occur: Safety/medical concerns (limited tolerance for upright)         Wheelchair 50 feet with 2 turns activity    Assist     Wheelchair 50 feet with 2 turns activity did not occur: Safety/medical concerns       Wheelchair 150 feet activity     Assist  Wheelchair 150 feet activity did not occur: Safety/medical concerns       Blood pressure (!) 174/83, pulse 69, temperature 99.3 F (37.4 C), temperature source Oral, resp. rate 14, height 5' 8.5" (1.74 m), weight 73.2 kg, SpO2 96 %.  Medical Problem List and Plan: 1.  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 Pt also with lesion involving T1 vertebral body and right side posterior elements with associated pathologic vertebral fracture concerning for metastatic disease (?prostate) or other neoplasm. 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  Status post plan loop recorder  -Continue CIR therapies including PT, OT  -turn q2-3 hours to reduce risk of pressure ulcers.  2.  Antithrombotics: -DVT/anticoagulation: Venous Doppler studies negative.  Continue [employer]: Lovenox             -antiplatelet therapy: Aspirin 81 mg daily and Plavix 75 mg day x3 weeks and aspirin alone 3. Pain Management: Tylenol and will add Lidoderm patches 8pm to 8am -3 of them 4. Mood: Provide emotional support  Celexa 10 mg daily started on 8/12             -antipsychotic agents: N/A 5. Neuropsych: This patient is capable of making decisions on his own behalf. 6. Skin/Wound Care: Routine skin checks 7. Fluids/Electrolytes/Nutrition: Routine in and outs 8.  Thrombocytopenia.  Resolved 9.  Neurogenic bowel bladder.  Foley catheter tube has been placed for multiple in and out catheterization Lidocaine jelly for bowel program.  8/15- con't Foley due to enlarged scrotum- would be hard to cath- likely won't void-  -started bowel program- with suppository- dig stim prn per wife's request 10.  Elevated PSA/enlarged prostate.  Family currently not committed to oncology work-up.  Family  has asked [place] to not discuss cancer possibility while here-  11. Depression and insomnia- Celexa 10 mg daily and melatonin 3 mg QHS prn.  12. Scrotal swelling- will elevate and Hospitalist is checking pro-BNP and giving a dose of Lasix.  Will monitor/follow.   8/15- slightly better- con't regimen 13. Dysphagia- on D3 thin diet- will con't SLP for swallowing therapy.  14. Resp issues-  Flutter valve- also con't ICS if desired.  15. Leukocytosis-UTI  WBCs 12.6 on 8/13 Chest x-ray unremarkable for infectious process  UA positive, urine culture E. coli pansensitive Exchange Foley  Continue Macrobid started on 8/13  8/15- WBC back down to baseline-  16.  Labile blood pressure  8/16 bp's still labile, monitor for pattern, ?AD 17. L>R hand swelling  Elevating limbs while in bed  LOS: 5 days A FACE TO FACE EVALUATION WAS PERFORMED  Ranelle Oyster 12/18/2020, 12:12 PM

## 2020-12-18 NOTE — Progress Notes (Signed)
Occupational Therapy Session Note  Patient Details  Name: [name] MRN: [id number] Date of Birth: 05/12/1945  Today's Date: 12/18/2020 OT Individual Time: 1000-1055 OT Individual Time Calculation (min): 55 min    Short Term Goals: Week 1:  OT Short Term Goal 1 (Week 1): Pt will maintain static sitting balance 2 mins with min assist in preparation for self-care tasks. OT Short Term Goal 2 (Week 1): Pt will assist in UB bathing with AE as needed to complete UB bathing with mod assist OT Short Term Goal 3 (Week 1): Pt will engage in rolling with max assist of one caregiver to decrease burden of care with toileting  Skilled Therapeutic Interventions/Progress Updates:    Pt resting in w/c upon arrival. Initial focus on use of u cuff on Rt hand for brushing teeth. Pt max A for brushing teeth with support provided at elbow. RUE AAROM for elbow flexion/extension-3x10. PROM Rt shoulder flexion with elbow extension. Pt tolerated well. Disussed use of AT for increased independence when at home. Will put in request for OTR to see pt regarding AT. Pt remained in w/c with sip and puff call bell placed appropriately.   Therapy Documentation Precautions:  Precautions Precautions: Back Precaution Comments: pathologic thoracic fracture- back precautions for safety Restrictions Weight Bearing Restrictions: No  Pain: Pt denies pain this morning   Therapy/Group: Individual Therapy  Rich Brave 12/18/2020, 11:33 AM

## 2020-12-18 NOTE — Progress Notes (Signed)
Physical Therapy Session Note  Patient Details  Name: [name] MRN: [id number] Date of Birth: 1945-05-26  Today's Date: 12/18/2020 PT Individual Time: 0900-1000; 1530-1640 PT Individual Time Calculation (min): 60 min and 70 min  Short Term Goals: Week 1:  PT Short Term Goal 1 (Week 1): Pt will tolerate at least  1 hr periods OOB in wc PT Short Term Goal 2 (Week 1): Pt will verbalize understanding of importance of and recite instructed  pressure relief schedule PT Short Term Goal 3 (Week 1): Pt will tolerate 10 min sitting balance activity prior to requiring supported rest breaks. PT Short Term Goal 4 (Week 1): Pt will participate in rolling via UEs and cervical motion. PT Short Term Goal 5 (Week 1): Pt will verbalize importance of rolling x 2 hrs to prevent pressure injuries  when in bed.  Skilled Therapeutic Interventions/Progress Updates:    Session 1: Pt received seated in bed, agreeable to PT session. No complaints of pain. Pt reports bowel incontinence. Rolling L/R with total A x 1-2 for dependent brief change and pericare. Reviewed bowel program schedule and goal of bowel regulation to prevent accidents. Assisted pt with donning thigh-high TEDs and pants at bed level. Supine to sit with total A x 2 for BLE management and trunk elevation. Slide board transfer bed to w/c with total A x 2. Pt agreeable to remain seated in TIS chair at end of session, needs in reach.  Session 2: Pt received seated in bed, agreeable to PT session. No complaints of pain. Pt on phone with wife and finishing conversation with CSW, pt and wife agreeable to discussion of pt's current level of function and expected level of function upon d/c home. Education with patient and his wife based on patient's injury and potential recovery it is likely he will d/c home with a power wheelchair and will not be ambulatory at d/c as pt's wife asking if he will be able to ambulate with a RW. Discussed patient's injury of C5 SCI  and expected functional outcomes at this level. Provided patient with home measurement sheet to have family complete. Also reviewed B UE/LE PROM and provided handout, placed at head of patient's bed on bulletin board. Pt reports incontinence of bowel. Rolling L/R with total A x 2 for dependent brief change and pericare. Supine to sit with total A x 2. Sitting balance EOB with max to total A. Seated L/R lateral leans x 5 reps each direction with total A needed to return to midline. Pt returned to supine due to fatigue with assist x 2. Pt left seated in bed with needs in reach at end of session.  Therapy Documentation Precautions:  Precautions Precautions: Back Precaution Comments: pathologic thoracic fracture- back precautions for safety Restrictions Weight Bearing Restrictions: No      Therapy/Group: Individual Therapy   Peter Congo, PT, DPT, CSRS  12/18/2020, 12:28 PM

## 2020-12-18 NOTE — Progress Notes (Signed)
Bowel program started at 1900. Small liquid stool with dig stim. Suppository inserted at 1905. Reported off to oncoming staff.   Marylu Lund, RN

## 2020-12-19 DIAGNOSIS — R0989 Other specified symptoms and signs involving the circulatory and respiratory systems: Secondary | ICD-10-CM

## 2020-12-19 NOTE — Progress Notes (Signed)
Occupational Therapy Session Note  Patient Details  Name: [name] MRN: [id number] Date of Birth: 10/08/1945  Today's Date: 12/19/2020 OT Individual Time: 0930-1040 OT Individual Time Calculation (min): 70 min    Short Term Goals: Week 1:  OT Short Term Goal 1 (Week 1): Pt will maintain static sitting balance 2 mins with min assist in preparation for self-care tasks. OT Short Term Goal 2 (Week 1): Pt will assist in UB bathing with AE as needed to complete UB bathing with mod assist OT Short Term Goal 3 (Week 1): Pt will engage in rolling with max assist of one caregiver to decrease burden of care with toileting  Skilled Therapeutic Interventions/Progress Updates:    OT intervention with focus on bed mobility, LB/UB dressing, sitting balance, SB tranfsers, activity tolerance, and safety awareness to increase independence with BADLs. Pt incontinent of bowel and dependent for hygiene. Rolling R/L in bed with tot A+2. Pt dependent for LB dressing at bed level. Supine>sit EOB with tot A+2. Sitting balance EOB with tot A+2. SB tranfser to w/c with tot A+2. UB dressing with tot A. Pt remained in w/c with all needs within reach. Sip and puff call bell positioned where pt can activate. Pt demonstrate proficiency in activating call bell and cell phone by voice activation.   Therapy Documentation Precautions:  Precautions Precautions: Back Precaution Comments: pathologic thoracic fracture- back precautions for safety Restrictions Weight Bearing Restrictions: No  Pain:  Pt denies pain this morning.   Therapy/Group: Individual Therapy  Rich Brave 12/19/2020, 10:44 AM

## 2020-12-19 NOTE — Progress Notes (Signed)
Occupational Therapy Session Note  Patient Details  Name: [name] MRN: [id number] Date of Birth: 09/01/1945  Today's Date: 12/19/2020 OT Individual Time: 1130-1155 OT Individual Time Calculation (min): 25 min    Short Term Goals: Week 1:  OT Short Term Goal 1 (Week 1): Pt will maintain static sitting balance 2 mins with min assist in preparation for self-care tasks. OT Short Term Goal 2 (Week 1): Pt will assist in UB bathing with AE as needed to complete UB bathing with mod assist OT Short Term Goal 3 (Week 1): Pt will engage in rolling with max assist of one caregiver to decrease burden of care with toileting  Skilled Therapeutic Interventions/Progress Updates:    Pt c/o of discomfort when wearing Lt hand splint during night. Focus on modifying/adapting resting hand splint by adding foam for thumb portion of splint. Instructed pt to wear during the night and if still uncomfortable, further modifications will be made. Pt remained in w/c with all needs within reach.   Therapy Documentation Precautions:  Precautions Precautions: Back Precaution Comments: pathologic thoracic fracture- back precautions for safety Restrictions Weight Bearing Restrictions: No  Pain: Pain Assessment Pain Scale: 0-10 Pain Score: 0-No pain Faces Pain Scale: No hurt   Therapy/Group: Individual Therapy  Rich Brave 12/19/2020, 12:14 PM

## 2020-12-19 NOTE — Progress Notes (Signed)
Physical Therapy Session Note  Patient Details  Name: [name] MRN: [id number] Date of Birth: 02/03/1946  Today's Date: 12/19/2020 PT Individual Time: 1335-1430 PT Individual Time Calculation (min): 55 min   Short Term Goals: Week 1:  PT Short Term Goal 1 (Week 1): Pt will tolerate at least  1 hr periods OOB in wc PT Short Term Goal 2 (Week 1): Pt will verbalize understanding of importance of and recite instructed  pressure relief schedule PT Short Term Goal 3 (Week 1): Pt will tolerate 10 min sitting balance activity prior to requiring supported rest breaks. PT Short Term Goal 4 (Week 1): Pt will participate in rolling via UEs and cervical motion. PT Short Term Goal 5 (Week 1): Pt will verbalize importance of rolling x 2 hrs to prevent pressure injuries  when in bed.  Skilled Therapeutic Interventions/Progress Updates:     Pt received seated in [facility] and agrees to therapy. No complaint of pain. WC transport outside for time management. Pt taken outside for community reintegration training as well as to promote upright sitting tolerance in different environment. Pt remains sitting in very slightly recliner chair for ~30 minutes prior to requesting that PT recliner chair due to fatigue. In sitting, PT provides bilateral elbow flexion soft tissue stretch with prlonged hold. Pt demos some reflexive flexor tone but able to achieve near full extension with each elbow when slowly stretched. Pt performs 2x10 bilateral elbow flexion with gravity minimized, bring hands to mouth in functional motion. Pt performs 1x10 shoulder shrug/retraction/depression for anterior body stretching and strengthening of postural muscles. WC transport back inside. Pt performs slideboard transfer back to bed with TotalA +1. Sit to supine with totalA. Left with heels elevated and with all needs within reach.  Therapy Documentation Precautions:  Precautions Precautions: Back Precaution Comments: pathologic thoracic  fracture- back precautions for safety Restrictions Weight Bearing Restrictions: No   Therapy/Group: Individual Therapy  Beau Fanny, PT, DPT 12/19/2020, 4:02 PM

## 2020-12-19 NOTE — Progress Notes (Signed)
PROGRESS NOTE   Subjective/Complaints: Patient seen sitting up in bed this morning.  He states he did not sleep well overnight because he was depressed about current events.  He notes coughing with nasal discharge.  Is because he administered it laying down.  ROS: Denies CP, SOB, N/V/D  Objective:   No results found. Recent Labs    12/17/20 0736  WBC 7.4  HGB 12.3*  HCT 35.2*  PLT 224    Recent Labs    12/17/20 0736  NA 137  K 3.6  CL 106  CO2 24  GLUCOSE 98  BUN 13  CREATININE 0.67  CALCIUM 8.2*     Intake/Output Summary (Last 24 hours) at 12/19/2020 3762 Last data filed at 12/19/2020 0156 Gross per 24 hour  Intake 118 ml  Output 1400 ml  Net -1282 ml         Physical Exam: Vital Signs Blood pressure (!) 156/78, pulse 73, temperature 99.4 F (37.4 C), temperature source Oral, resp. rate 18, height 5' 8.5" (1.74 m), weight 73.2 kg, SpO2 94 %. Constitutional: No distress . Vital signs reviewed. HENT: Normocephalic.  Atraumatic. Eyes: EOMI. No discharge. Cardiovascular: No JVD.  RRR. Respiratory: Normal effort.  No stridor.  Bilateral clear to auscultation. GI: Non-distended.  BS +. Skin: Warm and dry.  Intact. Psych: Normal mood.  Normal behavior. Musc: No edema in extremities.  No tenderness in extremities. Neuro: Alert GU: + Foley. Motor RUE: biceps 4/5, WE 3-/5, triceps 2-/5, grip 2-/5, finger abd 0/5, unchanged LUE: biceps 4/5, WE 3-/5, triceps 0/5, grip 1/5, and finger abd 0/5,stable LE's 0/5 in HF, KE, DF, APF. Senses gross touch in both legs.   Assessment/Plan: 1. Functional deficits which require 3+ hours per day of interdisciplinary therapy in a comprehensive inpatient rehab setting. Physiatrist is providing close team supervision and 24 hour management of active medical problems listed below. Physiatrist and rehab team continue to assess barriers to discharge/monitor patient progress  toward functional and medical goals  Care Tool:  Bathing        Body parts bathed by helper: Right arm, Left arm, Chest, Abdomen, Front perineal area, Buttocks, Right upper leg, Left upper leg, Right lower leg, Left lower leg, Face     Bathing assist Assist Level: 2 Helpers     Upper Body Dressing/Undressing Upper body dressing   What is the patient wearing?: Pull over shirt    Upper body assist Assist Level: 2 Helpers    Lower Body Dressing/Undressing Lower body dressing      What is the patient wearing?: Incontinence brief     Lower body assist Assist for lower body dressing: 2 Helpers     Toileting Toileting    Toileting assist Assist for toileting: 2 Helpers     Transfers Chair/bed transfer  Transfers assist  Chair/bed transfer activity did not occur: Safety/medical concerns  Chair/bed transfer assist level: 2 Helpers     Locomotion Ambulation   Ambulation assist   Ambulation activity did not occur: Safety/medical concerns          Walk 10 feet activity   Assist  Walk 10 feet activity did not occur: Safety/medical concerns  Walk 50 feet activity   Assist Walk 50 feet with 2 turns activity did not occur: Safety/medical concerns         Walk 150 feet activity   Assist Walk 150 feet activity did not occur: Safety/medical concerns         Walk 10 feet on uneven surface  activity   Assist Walk 10 feet on uneven surfaces activity did not occur: Safety/medical concerns         Wheelchair     Assist Will patient use wheelchair at discharge?: Yes Type of Wheelchair: Power Wheelchair activity did not occur: Safety/medical concerns (limited tolerance for upright)         Wheelchair 50 feet with 2 turns activity    Assist    Wheelchair 50 feet with 2 turns activity did not occur: Safety/medical concerns       Wheelchair 150 feet activity     Assist  Wheelchair 150 feet activity did not occur:  Safety/medical concerns       Blood pressure (!) 156/78, pulse 73, temperature 99.4 F (37.4 C), temperature source Oral, resp. rate 18, height 5' 8.5" (1.74 m), weight 73.2 kg, SpO2 94 %.  Medical Problem List and Plan: 1.  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 Pt also with lesion involving T1 vertebral body and right side posterior elements with associated pathologic vertebral fracture concerning for metastatic disease (?prostate) or other neoplasm. 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  Status post plan loop recorder  Continue CIR -turn q2-3 hours to reduce risk of pressure ulcers.  2.  Antithrombotics: -DVT/anticoagulation: Venous Doppler studies negative.  Continue Lovenox Pharmaceutical: Lovenox             -antiplatelet therapy: Aspirin 81 mg daily and Plavix 75 mg day x3 weeks and aspirin alone 3. Pain Management: Tylenol  Added Lidoderm patches 8pm to 8am -3 of them 4. Mood: Provide emotional support  Celexa 10 mg daily started on 8/12             -antipsychotic agents: N/A 5. Neuropsych: This patient is capable of making decisions on his own behalf. 6. Skin/Wound Care: Routine skin checks 7. Fluids/Electrolytes/Nutrition: Routine in and outs 8.  Thrombocytopenia.  Resolved 9.  Neurogenic bowel bladder.  Foley catheter tube has been placed for multiple in and out catheterization Lidocaine jelly for bowel program.  8/15- con't Foley due to enlarged scrotum- would be hard to cath- likely won't void-  -started bowel program- with suppository- dig stim prn per wife's request 10.  Elevated PSA/enlarged prostate.  Family currently not committed to oncology work-up.  Family has asked [place] to not discuss cancer possibility while here  11. Depression and insomnia- Celexa 10 mg daily and melatonin 3 mg QHS prn.  12. Scrotal swelling- will elevate and Hospitalist is checking pro-BNP and  giving a dose of Lasix.  Will monitor/follow.  13. Dysphagia- on D3 thin diet- will con't SLP for swallowing therapy.  14. Resp issues-  Flutter valve- also con't ICS if desired.  15. Leukocytosis-UTI Chest x-ray unremarkable for infectious process  UA positive, urine culture E. coli pansensitive Exchange Foley  Continue Macrobid started on 8/13 16.  Labile blood pressure  Slightly labile, but elevated on 8/17 17. L>R hand swelling  Elevating limbs while in bed  LOS: 6 days A FACE TO FACE EVALUATION WAS PERFORMED  [name] Karis Juba 12/19/2020, 9:27 AM

## 2020-12-19 NOTE — Progress Notes (Signed)
Speech Language Pathology Daily Session Note  Patient Details  Name: [name] MRN: [id number] Date of Birth: November 28, 1945  Today's Date: 12/19/2020 SLP Individual Time: 1445-1515 SLP Individual Time Calculation (min): 30 min  Short Term Goals: Week 1: SLP Short Term Goal 1 (Week 1): Patient will implement breath support techniques to increase vocal intensity and effectiveness at sentence level with sup A cues SLP Short Term Goal 2 (Week 1): Patient will tolerate current diet without overt s/sx of aspiration with sup A verbal cues to implement safe swallow precautions SLP Short Term Goal 3 (Week 1): Patient will demonstrate effecient mastication and oral clearance of regular textures over 2 observed sessions to demonstrate readiness for diet advancement SLP Short Term Goal 4 (Week 1): Patient will complete complex problem solving with sup A verbal cues  Skilled Therapeutic Interventions:   Patient seen for skilled ST session focusing on swallow function goals. Patient concerned about incident last night where he had persistent coughing through the night. He feels that this was caused by some of the water he was drinking possibly going down the wrong way, and/or from post-nasal drip of saline nasal spray. SLP discussed difference between penetration and aspiration with patient as well potential causes of cough/throat clearing response. Although patient has not had MBS to objectively assess swallow function, SLP recommended he try chin tuck posture with liquids and breath-hold swallow. Patient had mentioned he may have opened up his airway while swallowing water inadvertently and SLP in agreement that discoordination and/or disrupted timing of swallow could cause penetration and cough response. Patient and SLP also discussed HOB elevation, trying to limit amount of fluids he consumes just prior to bedtime. Patient then tried chin tuck and breath hold swallow and he did report he thinks that might help.  SLP plans to check in with patient next date and determine if objective swallow study would be warranted.   Pain Pain Assessment Pain Scale: 0-10 Pain Score: 0-No pain  Therapy/Group: Individual Therapy  Angela Nevin, MA, CCC-SLP Speech Therapy

## 2020-12-20 NOTE — Progress Notes (Signed)
Occupational Therapy Session Note  Patient Details  Name: [name] MRN: [id number] Date of Birth: 01/02/1946  Today's Date: 12/20/2020 OT Individual Time: 0962-8366 OT Individual Time Calculation (min): 75 min    Short Term Goals: Week 2:  OT Short Term Goal 1 (Week 2): Pt will assist in UB bathing with AE as needed to complete UB bathing with mod assist OT Short Term Goal 2 (Week 2): Pt will engage in rolling with max assist of one caregiver to decrease burden of care with toileting OT Short Term Goal 3 (Week 2): Pt will maintain static sitting balance 2 mins with min assist in preparation for self-care tasks.  Skilled Therapeutic Interventions/Progress Updates:    Pt resting in bed upon arrival and agreeable to getting OOB. OT intervention with focus on bed mobility, sitting balance, UB dressing, and Lt hand splint check and modifications. Rolling in bed with tot A+2. LB dressing at bed level with tot A+2. Supine>sit EOB with tot A+2. Sitting balance with tot A+2. Pt c/o neck getting tired when seated unsupported. UB dressing seated EOB. Tot A for doffing but pt able to assist with pulling over Lt hand. Max A for donning. Pt able to thread BUE into shirt after setup and thread RUE into sleeve. SB transfer to w/c with Tot A+2. Foam padding applied inside hand splint cover along medial forearm edge. Will check back with pt tomorrow regarding comfort when wearing. Pt remained in TIS w/c with all needs within reach. Pt able to activate sip and puff call bell and iPhone with voice commands.   Therapy Documentation Precautions:  Precautions Precautions: Back Precaution Comments: pathologic thoracic fracture- back precautions for safety Restrictions Weight Bearing Restrictions: No  Pain: Pt c/o BUE/shoulder discomfort but no increase in pain level with PROM; repositioned   Therapy/Group: Individual Therapy  Rich Brave 12/20/2020, 11:31 AM

## 2020-12-20 NOTE — Progress Notes (Signed)
Occupational Therapy Weekly Progress Note  Patient Details  Name: [name] MRN: [id number] Date of Birth: 1945-06-03  Beginning of progress report period: December 14, 2020 End of progress report period: December 20, 2020   Patient has met 0 of 3 short term goals.  Pt has made slow but steady progress since admission. Pt requires tot A+2 for bed mobility (rolling and supine<>sit EOB), tot A for dressing at bed level, and tot A+2 for SB transfers bed<>w/c. Tot A for sitting balance EOB.Pt is tolerating OOB in chair for 2+ hours/day. Pt uses sip and puff call bell independently after setup. Pt uses iPhone that has been set up for voice commands. Pt has been issued a u-cuff and is able to brush his teeth with max A. Pt with improved biceps and limited triceps, limited wrist flexion/extension, and absent finger flexion/extension. Pt currently wearing resting hand splints during night to prevent contractures.  Patient continues to demonstrate the following deficits: muscle weakness, decreased cardiorespiratoy endurance, impaired timing and sequencing, abnormal tone, unbalanced muscle activation, decreased coordination, and decreased motor planning, and decreased sitting balance, decreased postural control, decreased balance strategies, and difficulty maintaining precautions and therefore will continue to benefit from skilled OT intervention to enhance overall performance with BADL and Reduce care partner burden.  Patient progressing toward long term goals..  Continue plan of care.  OT Short Term Goals Week 1:  OT Short Term Goal 1 (Week 1): Pt will maintain static sitting balance 2 mins with min assist in preparation for self-care tasks. OT Short Term Goal 1 - Progress (Week 1): Progressing toward goal OT Short Term Goal 2 (Week 1): Pt will assist in UB bathing with AE as needed to complete UB bathing with mod assist OT Short Term Goal 2 - Progress (Week 1): Progressing toward goal OT Short Term Goal  3 (Week 1): Pt will engage in rolling with max assist of one caregiver to decrease burden of care with toileting OT Short Term Goal 3 - Progress (Week 1): Progressing toward goal Week 2:  OT Short Term Goal 1 (Week 2): Pt will assist in UB bathing with AE as needed to complete UB bathing with mod assist OT Short Term Goal 2 (Week 2): Pt will engage in rolling with max assist of one caregiver to decrease burden of care with toileting OT Short Term Goal 3 (Week 2): Pt will maintain static sitting balance 2 mins with min assist in preparation for self-care tasks.   [name] [facility] 12/20/2020, 6:37 AM

## 2020-12-20 NOTE — Progress Notes (Signed)
PROGRESS NOTE   Subjective/Complaints: Mood better today. Wife spent the night to celebrate their anniversary. C/o bilateral shoulder soreness. Lidocaine patches help  ROS: Patient denies fever, rash, sore throat, blurred vision, nausea, vomiting, diarrhea, cough, shortness of breath or chest pain   Objective:   No results found. No results for input(s): WBC, HGB, HCT, PLT in the last 72 hours. No results for input(s): NA, K, CL, CO2, GLUCOSE, BUN, CREATININE, CALCIUM in the last 72 hours.  Intake/Output Summary (Last 24 hours) at 12/20/2020 1038 Last data filed at 12/20/2020 0703 Gross per 24 hour  Intake 650 ml  Output 2250 ml  Net -1600 ml        Physical Exam: Vital Signs Blood pressure (!) 153/72, pulse 69, temperature 98.8 F (37.1 C), resp. rate 16, height 5' 8.5" (1.74 m), weight 73.2 kg, SpO2 95 %. Constitutional: No distress . Vital signs reviewed. HEENT: NCAT, EOMI, oral membranes moist Neck: supple Cardiovascular: RRR without murmur. No JVD    Respiratory/Chest: CTA Bilaterally without wheezes or rales. Normal effort    GI/Abdomen: BS +, non-tender, non-distended Ext: no clubbing, cyanosis, or edema Psych: pleasant and cooperative  Musc: No edema in extremities.  Mild tenderness at deltoids. Neuro: Alert GU: + Foley remains Motor RUE: biceps 4/5, WE 3-/5, triceps 2-/5, grip 2-/5, finger abd 0/5, unchanged LUE: biceps 4/5, WE 3-/5, triceps 0/5, grip 1/5, and finger abd 0/5,stable LE's 0/5 in HF, KE, DF, APF. Senses gross touch in both legs.   Assessment/Plan: 1. Functional deficits which require 3+ hours per day of interdisciplinary therapy in a comprehensive inpatient rehab setting. Physiatrist is providing close team supervision and 24 hour management of active medical problems listed below. Physiatrist and rehab team continue to assess barriers to discharge/monitor patient progress toward functional  and medical goals  Care Tool:  Bathing        Body parts bathed by helper: Right arm, Left arm, Chest, Abdomen, Front perineal area, Buttocks, Right upper leg, Left upper leg, Right lower leg, Left lower leg, Face     Bathing assist Assist Level: 2 Helpers     Upper Body Dressing/Undressing Upper body dressing   What is the patient wearing?: Pull over shirt    Upper body assist Assist Level: 2 Helpers    Lower Body Dressing/Undressing Lower body dressing      What is the patient wearing?: Incontinence brief     Lower body assist Assist for lower body dressing: 2 Helpers     Toileting Toileting    Toileting assist Assist for toileting: 2 Helpers     Transfers Chair/bed transfer  Transfers assist  Chair/bed transfer activity did not occur: Safety/medical concerns  Chair/bed transfer assist level: 2 Helpers     Locomotion Ambulation   Ambulation assist   Ambulation activity did not occur: Safety/medical concerns          Walk 10 feet activity   Assist  Walk 10 feet activity did not occur: Safety/medical concerns        Walk 50 feet activity   Assist Walk 50 feet with 2 turns activity did not occur: Safety/medical concerns  Walk 150 feet activity   Assist Walk 150 feet activity did not occur: Safety/medical concerns         Walk 10 feet on uneven surface  activity   Assist Walk 10 feet on uneven surfaces activity did not occur: Safety/medical concerns         Wheelchair     Assist Will patient use wheelchair at discharge?: Yes Type of Wheelchair: Power Wheelchair activity did not occur: Safety/medical concerns (limited tolerance for upright)         Wheelchair 50 feet with 2 turns activity    Assist    Wheelchair 50 feet with 2 turns activity did not occur: Safety/medical concerns       Wheelchair 150 feet activity     Assist  Wheelchair 150 feet activity did not occur: Safety/medical  concerns       Blood pressure (!) 153/72, pulse 69, temperature 98.8 F (37.1 C), resp. rate 16, height 5' 8.5" (1.74 m), weight 73.2 kg, SpO2 95 %.  Medical Problem List and Plan: 1.  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 Pt also with lesion involving T1 vertebral body and right side posterior elements with associated pathologic vertebral fracture concerning for metastatic disease (?prostate) or other neoplasm. 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  Status post plan loop recorder  Continue CIR PT, OT -turn q2-3 hours to reduce risk of pressure ulcers.  2.  Antithrombotics: -DVT/anticoagulation: Venous Doppler studies negative.  Continue Lovenox Pharmaceutical: Lovenox             -antiplatelet therapy: Aspirin 81 mg daily and Plavix 75 mg day x3 weeks and aspirin alone 3. Pain Management: Tylenol   - Lidoderm patches 8pm to 8am -3 of them -kpad for shoulder pain, encouraged use of tylenol 4. Mood/depression/insomnia: Provide emotional support  Celexa 10 mg daily started on 8/12  -melatonin for sleep  -neuropsych intervention would be helpful             -antipsychotic agents: N/A 5. Neuropsych: This patient is capable of making decisions on his own behalf. 6. Skin/Wound Care: Routine skin checks 7. Fluids/Electrolytes/Nutrition: Routine in and outs 8.  Thrombocytopenia.  Resolved 9.  Neurogenic bowel bladder.  Foley catheter tube has been placed for multiple in and out catheterization Lidocaine jelly for bowel program.  8/15- con't Foley due to enlarged scrotum- would be hard to cath- likely won't void-  -started bowel program- with suppository/dig stim prn per wife's request  -8/18 fairly effective so far, although stool mushy. On no softeners 10.  Elevated PSA/enlarged prostate.  Family currently not committed to oncology work-up.  Family has asked [place] to not discuss cancer  possibility while here   12. Scrotal swelling- will elevate and Hospitalist is checking pro-BNP and giving a dose of Lasix.  Will monitor/follow.  13. Dysphagia- on D3 thin diet- will con't SLP for swallowing therapy.  14. Resp issues-  Flutter valve- also con't ICS if desired.  15. Leukocytosis-UTI Chest x-ray unremarkable for infectious process  UA positive, urine culture E. coli pansensitive Exchange Foley  Continue Macrobid started on 8/13 16.  Blood pressure  Borderline at present. No changes to regimen for now--observe 17. L>R hand swelling  Elevating limbs while in bed  LOS: 7 days A FACE TO FACE EVALUATION WAS PERFORMED  Ranelle Oyster 12/20/2020, 10:38 AM

## 2020-12-20 NOTE — Progress Notes (Signed)
Speech Language Pathology Daily Session Note  Patient Details  Name: [name] MRN: [id number] Date of Birth: 06-18-1945  Today's Date: 12/20/2020 SLP Individual Time: 4332-9518 SLP Individual Time Calculation (min): 45 min  Short Term Goals: Week 1: SLP Short Term Goal 1 (Week 1): Patient will implement breath support techniques to increase vocal intensity and effectiveness at sentence level with sup A cues SLP Short Term Goal 2 (Week 1): Patient will tolerate current diet without overt s/sx of aspiration with sup A verbal cues to implement safe swallow precautions SLP Short Term Goal 3 (Week 1): Patient will demonstrate effecient mastication and oral clearance of regular textures over 2 observed sessions to demonstrate readiness for diet advancement SLP Short Term Goal 4 (Week 1): Patient will complete complex problem solving with sup A verbal cues  Skilled Therapeutic Interventions:   Patient seen for skilled ST session focusing on speech, voice goals. Patient reported that he had the best nights sleep in a while and did not have any coughing. He reports that he followed the strategies SLP had discussed and trialed previous date as well as the strategies that he has found to work and denied any coughing or swallowing difficulty. Patient participated in evaluation of his MIP (mean inspiratory pressure) and MEP (mean expiratory pressure) and did request rest break in between testing. His MIP was 55 and MEP was 18, but SLP intends to retest next date due to patient fatigue during MEP. Patient continues to benefit from skilled SLP intervention to maximize cognitive, speech, swallow function prior to discharge.  Pain Pain Assessment Pain Scale: 0-10 Faces Pain Scale: No hurt  Therapy/Group: Individual Therapy  Angela Nevin, MA, CCC-SLP Speech Therapy

## 2020-12-20 NOTE — Progress Notes (Signed)
Physical Therapy Session Note  Patient Details  Name: [name] MRN: [id number] Date of Birth: November 09, 1945  Today's Date: 12/20/2020 PT Individual Time: 1310-1352 PT Individual Time Calculation (min): 42 min   Short Term Goals: Week 1:  PT Short Term Goal 1 (Week 1): Pt will tolerate at least  1 hr periods OOB in wc PT Short Term Goal 2 (Week 1): Pt will verbalize understanding of importance of and recite instructed  pressure relief schedule PT Short Term Goal 3 (Week 1): Pt will tolerate 10 min sitting balance activity prior to requiring supported rest breaks. PT Short Term Goal 4 (Week 1): Pt will participate in rolling via UEs and cervical motion. PT Short Term Goal 5 (Week 1): Pt will verbalize importance of rolling x 2 hrs to prevent pressure injuries  when in bed.  Skilled Therapeutic Interventions/Progress Updates:   Pt received sitting in [facility] with RN present to complete lunch. PT returned in 10 minutes, and agreeable to PT.  Pt transported to rehab gym in [facility]. SB transfer to mat table with total A +2 with 6 inch step under feet to improve safety. Sitting balance EOB x 65mnutes. AP control and lateral control to push/pull torso with use of BUE supported on thighs and mat table as tolerated x 5 bil each. +2 assist throughout to maintain neutral posture and safety. Active rest break to perform trunkal extension/chest expansion for prolonged hold ~2 min each bout.  Pt reports intermittent bouts of increased lightheadedness. Following lateral control; [facility]. Pt reclined and noted improvement in facial color and reduce s/s of orthostasis.  Pt returned to room and performed SB transfer to bed with total A +2. Sit>supine completed with total A +2 and left supine in bed with call bell in reach and all needs met.          Therapy Documentation Precautions:  Precautions Precautions: Back Precaution Comments: pathologic thoracic fracture- back  precautions for safety Restrictions Weight Bearing Restrictions: No   Pain:  denies    Therapy/Group: Individual Therapy  ALorie Phenix8/18/2022, 2:03 PM

## 2020-12-20 NOTE — Progress Notes (Signed)
Physical Therapy Session Note  Patient Details  Name: [name] MRN: [id number] Date of Birth: Sep 15, 1945  Today's Date: 12/20/2020 PT Individual Time: 1400-1430 PT Individual Time Calculation (min): 30 min   Short Term Goals: Week 1:  PT Short Term Goal 1 (Week 1): Pt will tolerate at least  1 hr periods OOB in wc PT Short Term Goal 2 (Week 1): Pt will verbalize understanding of importance of and recite instructed  pressure relief schedule PT Short Term Goal 3 (Week 1): Pt will tolerate 10 min sitting balance activity prior to requiring supported rest breaks. PT Short Term Goal 4 (Week 1): Pt will participate in rolling via UEs and cervical motion. PT Short Term Goal 5 (Week 1): Pt will verbalize importance of rolling x 2 hrs to prevent pressure injuries  when in bed.  Skilled Therapeutic Interventions/Progress Updates:    pt received in bed and agreeable to therapy. No complaint of pain. Therapist performed the following passive stretches to maintain ROM for functional mobility: ankle dorsiflexion, hip flexion, hip IR and ER, elbow extension. Spasticity noted in BIL biceps with stretch. Pt performed AAROM of R elbow, needing less assistance with repetition. Pt reports being pleased with the movement he has gained. Pt left in bed in comfortable position with sip and puff call bell and voice activated iphone in place.   Therapy Documentation Precautions:  Precautions Precautions: Back Precaution Comments: pathologic thoracic fracture- back precautions for safety Restrictions Weight Bearing Restrictions: No   Therapy/Group: Individual Therapy  Juluis Rainier 12/20/2020, 4:07 PM

## 2020-12-20 NOTE — Progress Notes (Signed)
Dig stim performed x1, medium bowel movement noted. Digital check performed no more stool in rectal vault.

## 2020-12-20 NOTE — Progress Notes (Signed)
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 No pertinent past medical history. Past Surgical History:  Past Surgical History:  Procedure Laterality Date   LOOP RECORDER INSERTION N/A 12/13/2020   Procedure: LOOP RECORDER INSERTION;  Surgeon: [name], MD;  Location: [place] [facility];  Service: Cardiovascular;  Laterality: N/A;   TONSILLECTOMY     Social History:  reports that he has never smoked. He has never used smokeless tobacco. He reports that he does not currently use alcohol. He reports that he does not use drugs.  Family / Support Systems Marital Status: Married How Long?: 58 years (anniversary 8/18) Patient Roles: Spouse, Parent Spouse/Significant Other: [name] (wife) [phone] Children: two adult children: dtr lives in [place] and will be here for 2 weeks; son lives in [place] Other Supports: son in law will stay here until patient discharges to provide assistance Anticipated Caregiver: wife Ability/Limitations of Caregiver: TBD limitations; pt wife can provider 24/7 supervision. Physical assistance is questionable. Caregiver Availability: 24/7 Family Dynamics: Pt is retired and lives with his wife.  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 Education: college grad Read: Yes Write: Yes Employment Status: Retired Date Retired/Disabled/Unemployed: [employer] (contract work) Public relations account executive Issues: Denies Guardian/Conservator: N/A   Abuse/Neglect Abuse/Neglect  Assessment Can Be Completed: Yes Physical Abuse: Denies Verbal Abuse: Denies Sexual Abuse: Denies Exploitation of patient/patient's resources: Denies Self-Neglect: Denies  Emotional Status Pt's affect, behavior and adjustment status: Pt in good spirits at time of visit Recent Psychosocial Issues: Denies Psychiatric History: Denies Substance Abuse History: Denies  Patient / Family Perceptions, Expectations & Goals Pt/Family understanding of illness & functional limitations: Pt and family have a general understanding of care needs Premorbid pt/family roles/activities: Independent Anticipated changes in roles/activities/participation: Assistance with ADLs/IADLs Pt/family expectations/goals: Pt goal is to get out of here with strength, and be mobile as much as possible; and use RW if I can. Wife's goal is for pt to walk with RW and to be able to lift self up on own.  Community Resources [employer] Scripts: None Premorbid Home Care/DME Agencies: None Transportation available at discharge: wife Resource referrals recommended: Neuropsychology  Discharge Planning Living Arrangements: Spouse/significant other Support Systems: Spouse/significant other, Other relatives Type of Residence: Private residence Insurance Resources: [employer], Multimedia programmer (specify) Nurse, mental health) Financial Resources: Fish farm manager, Other (Comment) Financial Screen Referred: No Living Expenses: Own Money Management: Patient, Spouse Does the patient have any problems obtaining your medications?: No Home Management: Both manage home care needs Patient/Family Preliminary Plans: TBD Care Coordinator Barriers to Discharge: Lack of/limited family support, Decreased caregiver support Care Coordinator Anticipated Follow Up Needs: HH/OP  Clinical Impression SW met with pt and bedside to complete assessment, and pt wife on phone. Has HCPOA form. Pt is DNR. [employer] [phone]. No DME. SW encouraged family to  consider applying for VA benefits to see if he is eligible for any services.   Rana Snare 12/20/2020, 12:38 PM

## 2020-12-20 NOTE — Progress Notes (Signed)
Speech Language Pathology Weekly Progress and Session Note  Patient Details  Name: [name] MRN: [id number] Date of Birth: 01-28-1946  Beginning of progress report period: December 14, 2020 End of progress report period: December 21, 2020  Today's Date: 12/21/2020 SLP Individual Time: 1305-1420 SLP Individual Time Calculation (min): 75 min  Short Term Goals: Week 1: SLP Short Term Goal 1 (Week 1): Patient will implement breath support techniques to increase vocal intensity and effectiveness at sentence level with sup A cues SLP Short Term Goal 1 - Progress (Week 1): Met SLP Short Term Goal 2 (Week 1): Patient will tolerate current diet without overt s/sx of aspiration with sup A verbal cues to implement safe swallow precautions SLP Short Term Goal 2 - Progress (Week 1): Met SLP Short Term Goal 3 (Week 1): Patient will demonstrate effecient mastication and oral clearance of regular textures over 2 observed sessions to demonstrate readiness for diet advancement SLP Short Term Goal 3 - Progress (Week 1): Met SLP Short Term Goal 4 (Week 1): Patient will complete complex problem solving with sup A verbal cues SLP Short Term Goal 4 - Progress (Week 1): Met  New Short Term Goals: Week 2: SLP Short Term Goal 1 (Week 2): Patient will complete 25 repetitions of IMT/EMT at 50% of MEP with 80% or better accuracy and a self perceived level of effort rating #7 or less on a 0-10 scale SLP Short Term Goal 2 (Week 2): Patient will tolerate current diet without overt s/sx of aspiration and implement safe swallow precautions with mod I SLP Short Term Goal 3 (Week 2): Patient will complete complex problem solving with mod I  Weekly Progress Updates: Patient has met 4 of 4 short term goals this reporting period. Patient has made functional progress with ST over the past week due to improved breath support for speech which consequently improved vocal intensity, complex problem solving at sup A level, tolerance  of current diet with minimal overt s/sx of aspiration, and recent diet advancement to regular textures and thin liquids. SLP has been focusing on RMT to further enhance breath support and speech intensity, and addressing safe swallow strategies and precautions likely attributed to positioning in bed and with thin liquid intake. Patient education is ongoing. Recommend continued skilled ST intervention to maximize speech, swallow, and cognitive function and functional independence prior to discharge. Continue progression toward established LTGs set at mod I level overall.    Intensity: Minumum of 1-2 x/day, 30 to 90 minutes Frequency: 1 to 3 out of 7 days Duration/Length of Stay: 9/08 Treatment/Interventions: Dysphagia/aspiration precaution training;Patient/family education;Internal/external aids;Other (comment)  Daily Session Skilled Therapeutic Interventions: Patient agreeable to skilled ST intervention with focus on speech and swallow goals. SLP facilitated continuation of RMT assessment. Patient achieved MEP 26, MIP 66. Patient is an appropriate candidate for RMT for both inspiratory and expiratory muscle strength training. Initiated use of EMT as values were the most deficient in this area. Patient successfully executed 10 repetitions (2 sets of 5) with EMT trainer set to 13cm/h20 (50% of patient's MEP value). Patient self perceived level of effort as 6 on a scale of 1-10 scale and displayed good self monitoring throughout task and denied any SOB or lightheadedness. Continue as tolerated. Device placed in drawer next to bed. Patient reported his spouse has been bringing food from home in which he described as regular consistencies. Patient denied any chewing/swallowing difficulty and is perceived as a reliable resource. SLP facilitated regular texture PO trials in which  patient exhibited timely and effective mastication and further oral prep, adequate oral clearance with trace oral residue, and no overt  s/sx of aspiration, complaints of globus sensation, and no fatigue observed or reported. No overt s/sx of aspiration with thin liquid straw sips. Considering tolerance during today's trials and patient having consumed regular consistencies without difficulty, recommend diet advancement to regular textures. Patient will continue to receive full supervision/feeding assistance due to physical limitations impacting self feeding. Patient was left in chair with alarm activated and immediate needs within reach at end of session. Continue per current plan of care.      General    Pain Pain Assessment Pain Scale: 0-10 Pain Score: 0-No pain  Therapy/Group: Individual Therapy  Patty Sermons 12/21/2020, 2:56 PM

## 2020-12-20 NOTE — Progress Notes (Signed)
Slept well throughout the night. Wife at bedside. No complaints verbalized. Turned and repositioned throughout the night.

## 2020-12-21 NOTE — Progress Notes (Signed)
PROGRESS NOTE   Subjective/Complaints: In relatively good spirits. Shoulder felt better yesterday. Only became a bit sore this morning before he woke up. Mentioned a shock like sensation he experienced in his back/legs when he was lowered in supine yesterday. Was very transient and not very painful.  ROS: Patient denies fever, rash, sore throat, blurred vision, nausea, vomiting, diarrhea, cough, shortness of breath or chest pain, joint or back pain, headache, or mood change.   Objective:   No results found. No results for input(s): WBC, HGB, HCT, PLT in the last 72 hours. No results for input(s): NA, K, CL, CO2, GLUCOSE, BUN, CREATININE, CALCIUM in the last 72 hours.  Intake/Output Summary (Last 24 hours) at 12/21/2020 0925 Last data filed at 12/21/2020 0735 Gross per 24 hour  Intake 517 ml  Output 2150 ml  Net -1633 ml        Physical Exam: Vital Signs Blood pressure 133/77, pulse 69, temperature 98.3 F (36.8 C), resp. rate 18, height 5' 8.5" (1.74 m), weight 73.2 kg, SpO2 96 %. Constitutional: No distress . Vital signs reviewed. HEENT: NCAT, EOMI, oral membranes moist Neck: supple Cardiovascular: RRR without murmur. No JVD    Respiratory/Chest: CTA Bilaterally without wheezes or rales. Normal effort    GI/Abdomen: BS +, non-tender, non-distended Ext: no clubbing, cyanosis, or edema Psych: pleasant and cooperative  Musc: No edema in extremities.  Mild tenderness at deltoids. Neuro: Alert GU: + Foley in, urine clear, straw colored Motor RUE: biceps 4/5, WE 3-/5, triceps 2-/5, grip 2-/5, finger abd 0/5, unchanged LUE: biceps 4/5, WE 3-/5, triceps 0/5, grip 1/5, and finger abd 0/5,stable LE's 0/5 in HF, KE, DF, APF. Senses gross touch in both legs.   Assessment/Plan: 1. Functional deficits which require 3+ hours per day of interdisciplinary therapy in a comprehensive inpatient rehab setting. Physiatrist is providing  close team supervision and 24 hour management of active medical problems listed below. Physiatrist and rehab team continue to assess barriers to discharge/monitor patient progress toward functional and medical goals  Care Tool:  Bathing        Body parts bathed by helper: Right arm, Left arm, Chest, Abdomen, Front perineal area, Buttocks, Right upper leg, Left upper leg, Right lower leg, Left lower leg, Face     Bathing assist Assist Level: 2 Helpers     Upper Body Dressing/Undressing Upper body dressing   What is the patient wearing?: Pull over shirt    Upper body assist Assist Level: 2 Helpers    Lower Body Dressing/Undressing Lower body dressing      What is the patient wearing?: Incontinence brief     Lower body assist Assist for lower body dressing: 2 Helpers     Toileting Toileting    Toileting assist Assist for toileting: 2 Helpers     Transfers Chair/bed transfer  Transfers assist  Chair/bed transfer activity did not occur: Safety/medical concerns  Chair/bed transfer assist level: 2 Helpers     Locomotion Ambulation   Ambulation assist   Ambulation activity did not occur: Safety/medical concerns          Walk 10 feet activity   Assist  Walk 10 feet activity did  not occur: Safety/medical concerns        Walk 50 feet activity   Assist Walk 50 feet with 2 turns activity did not occur: Safety/medical concerns         Walk 150 feet activity   Assist Walk 150 feet activity did not occur: Safety/medical concerns         Walk 10 feet on uneven surface  activity   Assist Walk 10 feet on uneven surfaces activity did not occur: Safety/medical concerns         Wheelchair     Assist Will patient use wheelchair at discharge?: Yes Type of Wheelchair: Power Wheelchair activity did not occur: Safety/medical concerns (limited tolerance for upright)         Wheelchair 50 feet with 2 turns activity    Assist     Wheelchair 50 feet with 2 turns activity did not occur: Safety/medical concerns       Wheelchair 150 feet activity     Assist  Wheelchair 150 feet activity did not occur: Safety/medical concerns       Blood pressure 133/77, pulse 69, temperature 98.3 F (36.8 C), resp. rate 18, height 5' 8.5" (1.74 m), weight 73.2 kg, SpO2 96 %.  Medical Problem List and Plan: 1.  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 Pt also with lesion involving T1 vertebral body and right side posterior elements with associated pathologic vertebral fracture concerning for metastatic disease (?prostate) or other neoplasm. 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  Status post plan loop recorder  -Continue CIR therapies including PT, OT  -turn q2-3 hours to reduce risk of pressure ulcers.  -discussed with him that the electrical back/leg sensation he has experienced is likely due to his cord injury/tethering. Will monitor for persistence, increased frequency and severity 2.  Antithrombotics: -DVT/anticoagulation: Venous Doppler studies negative.  Continue Lovenox Pharmaceutical: Lovenox             -antiplatelet therapy: Aspirin 81 mg daily and Plavix 75 mg day x3 weeks and aspirin alone 3. Pain Management: Tylenol   - Lidoderm patches 8pm to 8am -3 of them -kpad for shoulder pain, encouraged use of tylenol 4. Mood/depression/insomnia: Provide emotional support  Celexa 10 mg daily started on 8/12  -melatonin for sleep  -neuropsych intervention would be helpful for him             -antipsychotic agents: N/A 5. Neuropsych: This patient is capable of making decisions on his own behalf. 6. Skin/Wound Care: Routine skin checks 7. Fluids/Electrolytes/Nutrition: Routine in and outs 8.  Thrombocytopenia.  Resolved 9.  Neurogenic bowel bladder.  Foley catheter tube has been placed for multiple in and out  catheterization Lidocaine jelly for bowel program.  8/15- con't Foley due to enlarged scrotum- would be hard to cath- likely won't void-  - bowel program- with suppository/dig stim prn per wife's request  -8/19 pm bowel program fairly effective. Stool more formed last night 10.  Elevated PSA/enlarged prostate.  Family currently not committed to oncology work-up.  Family has asked [place] to not discuss cancer possibility while here   12. Scrotal swelling- will elevate and Hospitalist is checking pro-BNP and giving a dose of Lasix.  Will monitor/follow.  13. Dysphagia- on D3 thin diet- will con't SLP for swallowing therapy.  14. Resp issues-  Flutter valve- also con't ICS if desired.  15. Leukocytosis-UTI Chest x-ray unremarkable for infectious process  UA positive, urine culture E. coli  pansensitive Exchange Foley every couple weeks  Continue Macrobid started on 8/13 16.  Blood pressure  Improved control 17. L>R hand swelling  Elevating limbs while in bed  LOS: 8 days A FACE TO FACE EVALUATION WAS PERFORMED  Ranelle Oyster 12/21/2020, 9:25 AM

## 2020-12-21 NOTE — Progress Notes (Signed)
Occupational Therapy Session Note  Patient Details  Name: [name] MRN: [id number] Date of Birth: 26-May-1945  Today's Date: 12/21/2020 OT Individual Time: 0930-1040 OT Individual Time Calculation (min): 70 min    Short Term Goals: Week 2:  OT Short Term Goal 1 (Week 2): Pt will assist in UB bathing with AE as needed to complete UB bathing with mod assist OT Short Term Goal 2 (Week 2): Pt will engage in rolling with max assist of one caregiver to decrease burden of care with toileting OT Short Term Goal 3 (Week 2): Pt will maintain static sitting balance 2 mins with min assist in preparation for self-care tasks.  Skilled Therapeutic Interventions/Progress Updates:    Pt resting in bed upon arrival and ready to get OOB. OT intervention with focus on bed mobility, sitting balance, SB tranfsers, directing care, and activity tolerance. Rolling R/L in bed to facilitate pulling pant over hips with tot A+2. Pt dependent for LB dressing tasks at bed level. Supine>sit EOB with tot A+2. Sitting balance EOB in preparation for SB transfer to w/c with tot A. SB transfer with tot A+2. Pt required tot A for donning pull over shirt seated in w/c. Pt initiated pulling shirt over elbows after OTA threaded BUE into sleeves. Pt setup on TIS w/c for comfort and accessibility of iPhone and sip and puff call bell. Pt able to activate call bell.   Therapy Documentation Precautions:  Precautions Precautions: Back Precaution Comments: pathologic thoracic fracture- back precautions for safety Restrictions Weight Bearing Restrictions: No   Pain: Pain Assessment Pain Scale: 0-10 Pain Score: 0-No pain  Therapy/Group: Individual Therapy  Rich Brave 12/21/2020, 11:57 AM

## 2020-12-21 NOTE — Plan of Care (Signed)
  Problem: Consults Goal: RH SPINAL CORD INJURY PATIENT EDUCATION Description:  See Patient Education module for education specifics.  Outcome: Progressing Goal: Skin Care Protocol Initiated - if Braden Score 18 or less Description: If consults are not indicated, leave blank or document N/A Outcome: Progressing   Problem: SCI BOWEL ELIMINATION Goal: RH STG MANAGE BOWEL WITH ASSISTANCE Description: STG Manage Bowel with mod Assistance. Outcome: Progressing Goal: RH STG SCI MANAGE BOWEL PROGRAM W/ASSIST OR AS APPROPRIATE Description: STG SCI Manage bowel program w/ mod assist or as appropriate. Outcome: Progressing   Problem: SCI BLADDER ELIMINATION Goal: RH STG MANAGE BLADDER WITH ASSISTANCE Description: STG Manage Bladder With mod Assistance Outcome: Progressing   Problem: RH SKIN INTEGRITY Goal: RH STG SKIN FREE OF INFECTION/BREAKDOWN Description: Skin to remain free from breakdown while on rehab with mod assistance. Outcome: Progressing Goal: RH STG MAINTAIN SKIN INTEGRITY WITH ASSISTANCE Description: STG Maintain Skin Integrity With mod Assistance. Outcome: Progressing   Problem: RH PAIN MANAGEMENT Goal: RH STG PAIN MANAGED AT OR BELOW PT'S PAIN GOAL Description: < 3 on a 0-10 pain scale. Outcome: Progressing   Problem: RH KNOWLEDGE DEFICIT SCI Goal: RH STG INCREASE KNOWLEDGE OF SELF CARE AFTER SCI Description: Patient will demonstrate knowledge of medication management, pain management, weight bearing precautions, skin/wound care with educational materials and handouts provided by staff independently at discharge. Outcome: Progressing   Problem: SCI BOWEL ELIMINATION Goal: RH STG MANAGE BOWEL WITH ASSISTANCE Description: STG Manage Bowel with Assistance. Outcome: Progressing   Problem: RH Pre-functional/Other (Specify) Goal: RH LTG Pre-functional (Specify) Outcome: Progressing   

## 2020-12-21 NOTE — Progress Notes (Signed)
Physical Therapy Session Note  Patient Details  Name: [name] MRN: [id number] Date of Birth: [birth year]  Today's Date: 12/21/2020 PT Individual Time: 5848-3507 PT Individual Time Calculation (min): 53 min   Short Term Goals: Week 1:  PT Short Term Goal 1 (Week 1): Pt will tolerate at least  1 hr periods OOB in wc PT Short Term Goal 2 (Week 1): Pt will verbalize understanding of importance of and recite instructed  pressure relief schedule PT Short Term Goal 3 (Week 1): Pt will tolerate 10 min sitting balance activity prior to requiring supported rest breaks. PT Short Term Goal 4 (Week 1): Pt will participate in rolling via UEs and cervical motion. PT Short Term Goal 5 (Week 1): Pt will verbalize importance of rolling x 2 hrs to prevent pressure injuries  when in bed.  Skilled Therapeutic Interventions/Progress Updates:   Pt received sitting in WC and agreeable to PT. LE PROM and prolonged stretching to Bil hamstrings and heel cord 3 x 2 min each. Sb transfer to bed with total A. Near LOB to the L due to elbow slipping off arm rest while attempting lateral weigh shift for BS placement. Pt left supine in bed with call bell in reach and all needs met.        Therapy Documentation Precautions:  Precautions Precautions: Back Precaution Comments: pathologic thoracic fracture- back precautions for safety Restrictions Weight Bearing Restrictions: No  Pain:   denies    Therapy/Group: Individual Therapy  Lorie Phenix 12/21/2020, 6:14 PM

## 2020-12-22 NOTE — Progress Notes (Signed)
Speech Language Pathology Daily Session Note  Patient Details  Name: [name] MRN: [id number] Date of Birth: 1946-04-19  Today's Date: 12/22/2020 SLP Individual Time: 0640-0720 SLP Individual Time Calculation (min): 40 min  Short Term Goals: Week 2: SLP Short Term Goal 1 (Week 2): Patient will complete 25 repetitions of IMT/EMT at 50% of MEP with 80% or better accuracy and a self perceived level of effort rating #7 or less on a 0-10 scale SLP Short Term Goal 2 (Week 2): Patient will tolerate current diet without overt s/sx of aspiration and implement safe swallow precautions with mod I SLP Short Term Goal 3 (Week 2): Patient will complete complex problem solving with mod I  Skilled Therapeutic Interventions: Skilled treatment session focused on dysphagia goals. SLP facilitated session by providing skilled observation with breakfast meal of regular textures with thin liquids. Patient is current a dependent feeder and verbalized appropriately when the bolus size and rate of feeding needed to be reduced as well as when he needed a liquid wash. Patient consumed meal without overt s/s of aspiration and independently utilized a liquid wash to help facilitate clearance. Recommend patient continue current diet. Patient left upright in bed with wife present. Continue with current plan of care.   Pain No/Denies Pain   Therapy/Group: Individual Therapy  [name] 12/22/2020, 9:03 AM

## 2020-12-22 NOTE — Plan of Care (Signed)
  Problem: Consults Goal: RH SPINAL CORD INJURY PATIENT EDUCATION Description:  See Patient Education module for education specifics.  Outcome: Progressing Goal: Skin Care Protocol Initiated - if Braden Score 18 or less Description: If consults are not indicated, leave blank or document N/A Outcome: Progressing   Problem: SCI BOWEL ELIMINATION Goal: RH STG MANAGE BOWEL WITH ASSISTANCE Description: STG Manage Bowel with mod Assistance. Outcome: Progressing Goal: RH STG SCI MANAGE BOWEL PROGRAM W/ASSIST OR AS APPROPRIATE Description: STG SCI Manage bowel program w/ mod assist or as appropriate. Outcome: Progressing   Problem: SCI BLADDER ELIMINATION Goal: RH STG MANAGE BLADDER WITH ASSISTANCE Description: STG Manage Bladder With mod Assistance Outcome: Progressing   Problem: RH SKIN INTEGRITY Goal: RH STG SKIN FREE OF INFECTION/BREAKDOWN Description: Skin to remain free from breakdown while on rehab with mod assistance. Outcome: Progressing Goal: RH STG MAINTAIN SKIN INTEGRITY WITH ASSISTANCE Description: STG Maintain Skin Integrity With mod Assistance. Outcome: Progressing   Problem: RH PAIN MANAGEMENT Goal: RH STG PAIN MANAGED AT OR BELOW PT'S PAIN GOAL Description: < 3 on a 0-10 pain scale. Outcome: Progressing   Problem: RH KNOWLEDGE DEFICIT SCI Goal: RH STG INCREASE KNOWLEDGE OF SELF CARE AFTER SCI Description: Patient will demonstrate knowledge of medication management, pain management, weight bearing precautions, skin/wound care with educational materials and handouts provided by staff independently at discharge. Outcome: Progressing   Problem: SCI BOWEL ELIMINATION Goal: RH STG MANAGE BOWEL WITH ASSISTANCE Description: STG Manage Bowel with Assistance. Outcome: Progressing   Problem: RH Pre-functional/Other (Specify) Goal: RH LTG Pre-functional (Specify) Outcome: Progressing

## 2020-12-22 NOTE — Progress Notes (Signed)
PROGRESS NOTE   Subjective/Complaints:No issues overnite   ROS: Patient denies CP, SOB, N/V/D Objective:   No results found. No results for input(s): WBC, HGB, HCT, PLT in the last 72 hours. No results for input(s): NA, K, CL, CO2, GLUCOSE, BUN, CREATININE, CALCIUM in the last 72 hours.  Intake/Output Summary (Last 24 hours) at 12/22/2020 0922 Last data filed at 12/22/2020 0835 Gross per 24 hour  Intake 500 ml  Output 2105 ml  Net -1605 ml         Physical Exam: Vital Signs Blood pressure (!) 156/75, pulse 73, temperature 98.5 F (36.9 C), temperature source Oral, resp. rate 18, height 5' 8.5" (1.74 m), weight 73.2 kg, SpO2 98 %.  General: No acute distress Mood and affect are appropriate Heart: Regular rate and rhythm no rubs murmurs or extra sounds Lungs: Clear to auscultation, breathing unlabored, no rales or wheezes Abdomen: Positive bowel sounds, soft nontender to palpation, nondistended Extremities: No clubbing, cyanosis, or edema Skin: No evidence of breakdown, no evidence of rash  Motor RUE: biceps 4/5, WE 3-/5, triceps 2-/5, grip 2-/5, finger abd 0/5, unchanged LUE: biceps 4/5, WE 3-/5, triceps 0/5, grip 1/5, and finger abd 0/5,stable LE's 0/5 in HF, KE, DF, APF. Senses gross touch in both legs.   Assessment/Plan: 1. Functional deficits which require 3+ hours per day of interdisciplinary therapy in a comprehensive inpatient rehab setting. Physiatrist is providing close team supervision and 24 hour management of active medical problems listed below. Physiatrist and rehab team continue to assess barriers to discharge/monitor patient progress toward functional and medical goals  Care Tool:  Bathing        Body parts bathed by helper: Right arm, Left arm, Chest, Abdomen, Front perineal area, Buttocks, Right upper leg, Left upper leg, Right lower leg, Left lower leg, Face     Bathing assist Assist Level:  2 Helpers     Upper Body Dressing/Undressing Upper body dressing   What is the patient wearing?: Pull over shirt    Upper body assist Assist Level: Total Assistance - Patient < 25%    Lower Body Dressing/Undressing Lower body dressing      What is the patient wearing?: Pants     Lower body assist Assist for lower body dressing: 2 Helpers     Toileting Toileting    Toileting assist Assist for toileting: 2 Helpers     Transfers Chair/bed transfer  Transfers assist  Chair/bed transfer activity did not occur: Safety/medical concerns  Chair/bed transfer assist level: 2 Helpers     Locomotion Ambulation   Ambulation assist   Ambulation activity did not occur: Safety/medical concerns          Walk 10 feet activity   Assist  Walk 10 feet activity did not occur: Safety/medical concerns        Walk 50 feet activity   Assist Walk 50 feet with 2 turns activity did not occur: Safety/medical concerns         Walk 150 feet activity   Assist Walk 150 feet activity did not occur: Safety/medical concerns         Walk 10 feet on uneven surface  activity  Assist Walk 10 feet on uneven surfaces activity did not occur: Safety/medical concerns         Wheelchair     Assist Will patient use wheelchair at discharge?: Yes Type of Wheelchair: Power Wheelchair activity did not occur: Safety/medical concerns (limited tolerance for upright)         Wheelchair 50 feet with 2 turns activity    Assist    Wheelchair 50 feet with 2 turns activity did not occur: Safety/medical concerns       Wheelchair 150 feet activity     Assist  Wheelchair 150 feet activity did not occur: Safety/medical concerns       Blood pressure (!) 156/75, pulse 73, temperature 98.5 F (36.9 C), temperature source Oral, resp. rate 18, height 5' 8.5" (1.74 m), weight 73.2 kg, SpO2 98 %.  Medical Problem List and Plan: 1.  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 Pt also with lesion involving T1 vertebral body and right side posterior elements with associated pathologic vertebral fracture concerning for metastatic disease (?prostate) or other neoplasm. 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  Status post plan loop recorder  -Continue CIR therapies including PT, OT  -turn q2-3 hours to reduce risk of pressure ulcers.  -shoulder pain improved with lidoderm patch  2.  Antithrombotics: -DVT/anticoagulation: Venous Doppler studies negative.  Continue Lovenox Pharm[employer]: Lovenox             -antiplatelet therapy: Aspirin 81 mg daily and Plavix 75 mg day x3 weeks and aspirin alone 3. Pain Management: Tylenol   - Lidoderm patches 8pm to 8am -3 of them -kpad for shoulder pain, encouraged use of tylenol 4. Mood/depression/insomnia: Provide emotional support  Celexa 10 mg daily started on 8/12  -melatonin for sleep  -neuropsych intervention would be helpful for him             -antipsychotic agents: N/A 5. Neuropsych: This patient is capable of making decisions on his own behalf. 6. Skin/Wound Care: Routine skin checks 7. Fluids/Electrolytes/Nutrition: Routine in and outs 8.  Thrombocytopenia.  Resolved 9.  Neurogenic bowel bladder.  Foley catheter tube has been placed for multiple in and out catheterization Lidocaine jelly for bowel program.  8/15- con't Foley due to enlarged scrotum- would be hard to cath- likely won't void-  - bowel program- with suppository/dig stim prn per wife's request  -8/19 pm bowel program large BM 10.  Elevated PSA/enlarged prostate.  Family currently not committed to oncology work-up.  Family has asked [place] to not discuss cancer possibility while here   12. Scrotal swelling- will elevate and Hospitalist is checking pro-BNP and giving a dose of Lasix.  Will monitor/follow.  13. Dysphagia- on D3 thin diet- will con't SLP for  swallowing therapy.  14. Resp issues-  Flutter valve- also con't ICS if desired.  15. Leukocytosis-UTI Completed macrobid 16.  Blood pressure   Vitals:   12/21/20 1932 12/22/20 0530  BP: 127/66 (!) 156/75  Pulse: (!) 57 73  Resp: 15 18  Temp: 97.9 F (36.6 C) 98.5 F (36.9 C)  SpO2: 98% 98%   Lability cont to monitor     LOS: 9 days A FACE TO FACE EVALUATION WAS PERFORMED  [name] 12/22/2020, 9:22 AM

## 2020-12-23 NOTE — Progress Notes (Signed)
Speech Language Pathology Daily Session Note  Patient Details  Name: [name] MRN: [id number] Date of Birth: 09-08-1945  Today's Date: 12/23/2020 SLP Individual Time: 9518-8416 SLP Individual Time Calculation (min): 45 min  Short Term Goals: Week 2: SLP Short Term Goal 1 (Week 2): Patient will complete 25 repetitions of IMT/EMT at 50% of MEP with 80% or better accuracy and a self perceived level of effort rating #7 or less on a 0-10 scale SLP Short Term Goal 2 (Week 2): Patient will tolerate current diet without overt s/sx of aspiration and implement safe swallow precautions with mod I SLP Short Term Goal 3 (Week 2): Patient will complete complex problem solving with mod I  Skilled Therapeutic Interventions: Pt seen for skilled ST with focus on cognitive and speech goals. Patient successfully executed 10 repetitions (2 sets of 5) with EMT trainer set to 13cm/h20. Patient self perceived level of effort as 6 to 7 on a scale of 1-10 scale and displayed good self monitoring throughout task and denied any SOB or lightheadedness. Pt did endorse task was harder in bed vs in wheelchair, goal for pt to be up in wheelchair for all ST tx in the future. SLP facilitating complex problem solving relating to home environment requiring supervision level cues for 100% accuracy. Pt has very supportive family, ongoing education and training will be beneficial. Pt left in bed with alarm set and all needs on adaptive equipment within reach of mouth/face. Cont ST POC.   Pain Pain Assessment Pain Scale: 0-10 Pain Score: 0-No pain  Therapy/Group: Individual Therapy  Tacey Ruiz 12/23/2020, 2:40 PM

## 2020-12-23 NOTE — Progress Notes (Signed)
Occupational Therapy Session Note  Patient Details  Name: [name] MRN: [id number] Date of Birth: 15-Jul-1945  Today's Date: 12/24/2020 OT Individual Time: 1503-1530 OT Individual Time Calculation (min): 27 min   Skilled Therapeutic Interventions/Progress Updates:    Pt greeted in bed, no c/o pain and requesting bedlevel therapy due to fatigue and being up in the w/c for a majority of the day. Therefore worked on UE ROM/NMR together. Pt with exceptional tightness in elbow flexors (Lt>Rt) and forearm supinators. AAROM + gentle stretching completed. Discussed one method for pt to stretch digits on his own, also advised him to instruct caregivers in regards to opening/closing palm to help with edema mgt in digits and for balancing muscle synergies. Pt with more active movement in the Rt vs Lt UE. He remained in bed at close of session, sip and puff set up and bed alarm set. Tx focus placed on UE ROM/NMR to increase functional independence during BADL tasks and transfers.   Therapy Documentation Precautions:  Precautions Precautions: Back Precaution Comments: pathologic thoracic fracture- back precautions for safety Restrictions Weight Bearing Restrictions: No  Vital Signs: Therapy Vitals Temp: 97.8 F (36.6 C) Temp Source: Oral Pulse Rate: 65 Resp: 17 BP: 119/63 Patient Position (if appropriate): Lying Oxygen Therapy SpO2: 100 % O2 Device: Room Air Pain: Pain Assessment Pain Scale: 0-10 Pain Score: 0-No pain ADL: ADL Upper Body Bathing: Dependent (+2) Where Assessed-Upper Body Bathing: Edge of bed Lower Body Bathing: Dependent (+2) Where Assessed-Lower Body Bathing: Bed level Upper Body Dressing: Dependent (+2) Where Assessed-Upper Body Dressing: Edge of bed Lower Body Dressing: Dependent (+2) Where Assessed-Lower Body Dressing: Bed level Toileting: Dependent (+2) Where Assessed-Toileting: Bed level Toilet Transfer: Not assessed Tub/Shower Transfer: Not assessed       Therapy/Group: Individual Therapy  Jonta Gastineau A Stefanee Mckell 12/24/2020, 4:14 PM

## 2020-12-23 NOTE — Plan of Care (Signed)
  Problem: Consults Goal: RH SPINAL CORD INJURY PATIENT EDUCATION Description:  See Patient Education module for education specifics.  Outcome: Progressing Goal: Skin Care Protocol Initiated - if Braden Score 18 or less Description: If consults are not indicated, leave blank or document N/A Outcome: Progressing   Problem: SCI BOWEL ELIMINATION Goal: RH STG MANAGE BOWEL WITH ASSISTANCE Description: STG Manage Bowel with mod Assistance. Outcome: Progressing Goal: RH STG SCI MANAGE BOWEL PROGRAM W/ASSIST OR AS APPROPRIATE Description: STG SCI Manage bowel program w/ mod assist or as appropriate. Outcome: Progressing   Problem: SCI BLADDER ELIMINATION Goal: RH STG MANAGE BLADDER WITH ASSISTANCE Description: STG Manage Bladder With mod Assistance Outcome: Progressing   Problem: RH SKIN INTEGRITY Goal: RH STG SKIN FREE OF INFECTION/BREAKDOWN Description: Skin to remain free from breakdown while on rehab with mod assistance. Outcome: Progressing Goal: RH STG MAINTAIN SKIN INTEGRITY WITH ASSISTANCE Description: STG Maintain Skin Integrity With mod Assistance. Outcome: Progressing   Problem: RH PAIN MANAGEMENT Goal: RH STG PAIN MANAGED AT OR BELOW PT'S PAIN GOAL Description: < 3 on a 0-10 pain scale. Outcome: Progressing   Problem: RH KNOWLEDGE DEFICIT SCI Goal: RH STG INCREASE KNOWLEDGE OF SELF CARE AFTER SCI Description: Patient will demonstrate knowledge of medication management, pain management, weight bearing precautions, skin/wound care with educational materials and handouts provided by staff independently at discharge. Outcome: Progressing   Problem: SCI BOWEL ELIMINATION Goal: RH STG MANAGE BOWEL WITH ASSISTANCE Description: STG Manage Bowel with Assistance. Outcome: Progressing   Problem: RH Pre-functional/Other (Specify) Goal: RH LTG Pre-functional (Specify) Outcome: Progressing   

## 2020-12-24 LAB — BASIC METABOLIC PANEL
Anion gap: 8 (ref 5–15)
BUN: 16 mg/dL (ref 8–23)
CO2: 23 mmol/L (ref 22–32)
Calcium: 8.7 mg/dL — ABNORMAL LOW (ref 8.9–10.3)
Chloride: 107 mmol/L (ref 98–111)
Creatinine, Ser: 0.67 mg/dL (ref 0.61–1.24)
GFR, Estimated: >60 mL/min (ref >60)
Glucose, Bld: 94 mg/dL (ref 70–99)
Potassium: 4.3 mmol/L (ref 3.5–5.1)
Sodium: 138 mmol/L (ref 135–145)

## 2020-12-24 LAB — CBC WITH DIFFERENTIAL/PLATELET
Abs Immature Granulocytes: 0.12 10*3/uL — ABNORMAL HIGH (ref 0.00–0.07)
Basophils Absolute: 0.1 10*3/uL (ref 0.0–0.1)
Basophils Relative: 1 %
Eosinophils Absolute: 0.1 10*3/uL (ref 0.0–0.5)
Eosinophils Relative: 1 %
HCT: 36.2 % — ABNORMAL LOW (ref 39.0–52.0)
Hemoglobin: 12.1 g/dL — ABNORMAL LOW (ref 13.0–17.0)
Immature Granulocytes: 2 %
Lymphocytes Relative: 14 %
Lymphs Abs: 0.9 10*3/uL (ref 0.7–4.0)
MCH: 31.2 pg (ref 26.0–34.0)
MCHC: 33.4 g/dL (ref 30.0–36.0)
MCV: 93.3 fL (ref 80.0–100.0)
Monocytes Absolute: 0.7 10*3/uL (ref 0.1–1.0)
Monocytes Relative: 11 %
Neutro Abs: 4.7 10*3/uL (ref 1.7–7.7)
Neutrophils Relative %: 71 %
Platelets: 281 10*3/uL (ref 150–400)
RBC: 3.88 MIL/uL — ABNORMAL LOW (ref 4.22–5.81)
RDW: 13.1 % (ref 11.5–15.5)
WBC: 6.6 10*3/uL (ref 4.0–10.5)
nRBC: 0 % (ref 0.0–0.2)

## 2020-12-24 NOTE — Progress Notes (Signed)
Speech Language Pathology Daily Session Note  Patient Details  Name: [name] MRN: [id number] Date of Birth: 03/08/1946  Today's Date: 12/24/2020 SLP Individual Time: 1100-1200 SLP Individual Time Calculation (min): 60 min  Short Term Goals: Week 2: SLP Short Term Goal 1 (Week 2): Patient will complete 25 repetitions of IMT/EMT at 50% of MEP with 80% or better accuracy and a self perceived level of effort rating #7 or less on a 0-10 scale SLP Short Term Goal 2 (Week 2): Patient will tolerate current diet without overt s/sx of aspiration and implement safe swallow precautions with mod I SLP Short Term Goal 3 (Week 2): Patient will complete complex problem solving with mod I  Skilled Therapeutic Interventions: Patient was received reclined in TIS wheelchair and agreeable to skilled ST intervention with focus on speech and cognitive goals. Patient successfully executed 5 repetitions (1 set of 5) with EMT trainer set initially to 13cm/h20 with self perceived level of effort as 6 of 10. Patient progressed to 14cm/h20 and completed additional 20 repetitions (4 sets of 5) with self perceived level of effort as 7 of 10. Patient denied SOB or lightheadedness. Patient completed anticipatory problem solving related to anticipated needs at home with mod I. He exhibited adequate safety awareness, judgement, and insight current deficits and needs. Patient was left in wheelchair with alarm activated and adaptive equipment within reach of face/mouth at end of session. Continue per current plan of care with emphasis on breath support for speech using EMT.     Pain Pain Assessment Pain Scale: 0-10 Pain Score: 0-No pain  Therapy/Group: Individual Therapy  Tamala Ser 12/24/2020, 12:18 PM

## 2020-12-24 NOTE — Progress Notes (Signed)
Physical Therapy Weekly Progress Note  Patient Details  Name: [name] MRN: [id number] Date of Birth: Apr 22, 1946  Beginning of progress report period: December 14, 2020 End of progress report period: December 24, 2020  Today's Date: 12/24/2020 PT Individual Time:1303-1415 72 minutes    Patient has met 4 of 5 short term goals.  Patient progressing well and able to meet all STG's except for tolerating sitting balance activities for 10 minutes.  He currently requires total A for supine to sit and transfers and max A for rolling in bed.  He is still in manual tilt in space w/c, but have discussed trial for power w/c this upcoming week.  He needs mod A at the least for sitting balance and maintains for up to 1-2 minutes prior to needing to lean back to rest with support due to neck pain and respiratory issues with c/o light headedness at times.  Patient is initiating in directing in his care as he recognizes when staff move his legs too quickly and induce spasms he reports asking for them to slow down and give him 5-10 seconds.    Patient continues to demonstrate the following deficits muscle weakness and muscle paralysis, decreased cardiorespiratoy endurance, abnormal tone and quadraparesis, and decreased sitting balance, decreased postural control, decreased balance strategies, and poor upright/activity tolerance  and therefore will continue to benefit from skilled PT intervention to increase functional independence with mobility.  Patient progressing toward long term goals..  Continue plan of care.  PT Short Term Goals Week 1:  PT Short Term Goal 1 (Week 1): Pt will tolerate at least  1 hr periods OOB in wc PT Short Term Goal 1 - Progress (Week 1): Met PT Short Term Goal 2 (Week 1): Pt will verbalize understanding of importance of and recite instructed  pressure relief schedule PT Short Term Goal 2 - Progress (Week 1): Met PT Short Term Goal 3 (Week 1): Pt will tolerate 10 min sitting balance  activity prior to requiring supported rest breaks. PT Short Term Goal 3 - Progress (Week 1): Progressing toward goal PT Short Term Goal 4 (Week 1): Pt will participate in rolling via UEs and cervical motion. PT Short Term Goal 4 - Progress (Week 1): Met PT Short Term Goal 5 (Week 1): Pt will verbalize importance of rolling x 2 hrs to prevent pressure injuries  when in bed. PT Short Term Goal 5 - Progress (Week 1): Met Week 2:  PT Short Term Goal 1 (Week 2): Patient will initiate power wheelchair mobility. PT Short Term Goal 2 (Week 2): Patient will direct slide board transfers 50% of tasks. PT Short Term Goal 3 (Week 2): Patient will tolerate EOB balance activites for 20 minutes. PT Short Term Goal 4 (Week 2): Patient will tolerate 10 minutes sitting balance activity prior to needing supported rest breaks.  Skilled Therapeutic Interventions/Progress Updates:    Patient in w/c finishing eating lunch tilted back in tilt in space wheelchair.  Patient reports was light headed earlier due to not eating.  Feels full now.  Able to report with questioning cues time for pressure reliefs and reason for rolling in bed.  Patient reports at this point no skin issues.  Assisted in w/c to therapy gym.  Performed slide board transfer with total A (+2 for safety).  Sitting balance EOM with mod A at times due to increased thoracic kyphosis with cervical forward flexion and increased anterior bias.  Patient able to extend partially and maintain for 10 seconds  at a time.  C/o pain in neck and decreased respiratory efficiency so leans back against rehab tech for rest breaks after about 1.5 minutes working on balance.  Back up twice more to work on balance EOM and even with elbows on knees using upper trunk and cervical extensors with A to recover.  Patient to supine +2 A for trunk and legs.  Supine to R sidelying with cues pt able to initiate with head and shoulder with max A rolled to L as well but pt c/o shoulder pain on L  in full sidelying position.  Performed passive stretch to LE's in supine for muscle length, bone stimulation and spasticity management.  Patient to sitting with +2 total A.  Performed slide board transfer to w/c total A of 1.  Transported to room in w/c.  Transfer to bed with +2 A.  Patient positioned for comfort and placing phone and sip n puff call bell in reach.    Therapy Documentation Precautions:  Precautions Precautions: Back Precaution Comments: pathologic thoracic fracture- back precautions for safety Restrictions Weight Bearing Restrictions: No  Pain: Pain Assessment Faces Pain Scale: Hurts even more Pain Type: Acute pain Pain Location: Neck Pain Orientation: Posterior Pain Descriptors / Indicators: Grimacing Pain Onset: With Activity Pain Intervention(s): Repositioned  Therapy/Group: Individual Therapy  Reginia Naas Magda Kiel, [place] 12/24/2020, 6:35 PM

## 2020-12-24 NOTE — Consult Note (Signed)
Neuropsychological Consultation   Patient:   [name]   DOB:   11-Jan-1946  MR Number:  [id number]  Location:  [facility] [address] CENTER B [id number] [place] [address] Dept: [phone] Loc: [phone]           Date of Service:   12/24/2020  Start Time:   8 AM End Time:   9 AM  Provider/Observer:  Arley Phenix, Psy.D.       Clinical Neuropsychologist       Billing Code/Service: [phone]  Chief Complaint:    [name] is a 75 year old right-handed male who has unremarkable past medical history [name] that did not require surgical intervention.  Patient presented on 12/07/2020 with onset of progressive diffuse weakness, pain across his neck/shoulders and inability to walk.  MRI of brain revealed acute left perirolandic infarct with mild small vessel disease.  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  There was a question cord infarction versus transverse myelitis versus inflammatory process, mild to moderate spinal stenosis Pacheco and mild to moderate stenosis C4-T1 disc and facet.  Specific etiological factors have not been fully worked up as the patient's family wanted to have him focus on rehab other than some of the potential causative variables.  Family was not interested in aggressive cancer work-up currently.  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  Below is HPI for the current admission.  HPI: [name] is a 75 year old right-handed male unremarkable past medical history except he did have a history of spinal  stenosis Pacheco not requiring surgical intervention and on no prescription medications.  Per chart review patient lives with spouse.  Independent prior to admission.  Presented 12/07/2020 with onset of progressive diffuse weakness pain across his neck/shoulders and inability to walk.  Urine drug screen was negative.  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  Neurology consulted Dr. 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  Dr.Xu felt that stroke to brain/spine likely cardioembolic in nature and recommended DAPT as well as loop recorder to rule out atrial fibrillation .  Carotid Dopplers negative for ICA stenosis.  Bilateral extremity Dopplers negative.  Echocardiogram with ejection fraction of 60 to 65% no wall motion abnormalities.  Dr. Bertis Ruddy of hematology service consulted for input on pancytopenia question metastatic prostate cancer due to work-up revealing enlarged prostate and elevated PSA.  Bone scan with bone biopsy recommended for work-up of pancytopenia.  Family not interested in aggressive cancer work-up if patient did not have the ability to gain prior level of independence.  He was cleared to begin Lovenox for DVT prophylaxis.  He was  placed on aspirin Plavix for CVA prophylaxis x3 weeks total then aspirin alone.  Therapy evaluations completed due to  patient's quadriparesis was admitted for a comprehensive rehab program.  Current Status:  Patient sitting up awake and alert in bed slightly elevated with head lying back on 2 pillows.  Patient displayed adequate mental status and orientation but did report that he has more trouble getting full breaths in the morning.  He reports that his breathing improves as the day persistently goes therapies.  Patient denies any current significant depression impeding his ability to fully participate in therapies.  Patient does acknowledge episode several days before where his depressive response peaked and he attributed it to not eating well and having GI distress attributable to his antibiotic.  I asked the patient what his understanding as far as his current status was and the patient attributed his difficulties to stroke and this was not elaborated any further.  Patient reports that he is feeling better over the past couple of days as far as mood state and seen his functional gains he is making in therapy.  Behavioral Observation: [name]  presents as a 75 y.o.-year-old Right Caucasian Male who appeared his stated age. his dress was Appropriate and he was Well Groomed and his manners were Appropriate to the situation.  his participation was indicative of Appropriate behaviors.  There were physical disabilities noted.  he displayed an appropriate level of cooperation and motivation.     Interactions:    Active Appropriate  Attention:   abnormal and attention span appeared shorter than expected for age  Memory:   within normal limits; recent and remote memory intact  Visuo-spatial:  not examined  Speech (Volume):  low  Speech:   normal; normal  Thought Process:  Coherent and Relevant  Though Content:  WNL; not suicidal and not homicidal  Orientation:   person, place, time/date, and  situation  Judgment:   Fair  Planning:   Fair  Affect:    Lethargic  Mood:    Dysphoric  Insight:   Good  Intelligence:   high  Medical History:  History reviewed. No pertinent past medical history.       Patient Active Problem List   Diagnosis Date Noted   Labile blood pressure    Acute lower UTI    Dysphagia 12/14/2020   Quadriplegia (HCC) 12/13/2020   Neurogenic bowel 12/13/2020   Neurogenic bladder 12/13/2020   Scrotal swelling 12/13/2020   Foley catheter in place on admission 12/13/2020   CVA (cerebral vascular accident) (HCC) 12/08/2020   Acute CVA (cerebrovascular accident) (HCC) 12/07/2020   Abnormal MRI 12/07/2020   Sinus bradycardia seen on cardiac monitor 12/07/2020    Psychiatric History:  No prior psychiatric history  Family Med/Psych History:  Family History  Problem Relation Age of Onset   Cancer Mother    Dementia Mother    Heart attack Father    Clotting disorder Father     Risk of Suicide/Violence: low patient denies any suicidal or homicidal ideation.  Impression/DX:  Jashun Puertas is a 75 year old right-handed male who has unremarkable past medical history [name] that did not require surgical intervention.  Patient presented on 12/07/2020 with onset of progressive diffuse weakness, pain across his neck/shoulders and inability to walk.  MRI of brain revealed acute left perirolandic infarct with mild small vessel disease.  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  There was a question cord infarction versus transverse myelitis versus inflammatory process, mild to moderate spinal stenosis Pacheco and mild to moderate stenosis C4-T1 disc and facet.  Specific etiological factors  have not been fully worked up as the patient's family wanted to have him focus on rehab other than some of the potential causative variables.  Family was not interested in aggressive cancer work-up currently.  Patient sitting up awake and alert in bed slightly elevated with head lying back on 2 pillows.  Patient displayed adequate mental status and orientation but did report that he has more trouble getting full breaths in the morning.  He reports that his breathing improves as the day persistently goes therapies.  Patient denies any current significant depression impeding his ability to fully participate in therapies.  Patient does acknowledge episode several days before where his depressive response peaked and he attributed it to not eating well and having GI distress attributable to his antibiotic.  I asked the patient what his understanding as far as his current status was and the patient attributed his difficulties to stroke and this was not elaborated any further.  Patient reports that he is feeling better over the past couple of days as far as mood state and seen his functional gains he is making in therapy.  Disposition/Plan:  Today we worked on coping and adjustment issues after his acute loss of motor function with current ill-defined etiological factors.  Patient appears to be doing better as far as mood state and reports that he has been feeling better the past couple of days and his acute emotional response has settled and he is focused on therapeutic efforts going forward.  Patient will inform staff if his mood state starts to interfere with his ability to fully participate in therapeutic interventions.  Diagnosis:    Acute CVA (cerebrovascular accident) (HCC) - Plan: Ambulatory referral to Neurology  Leucocytosis - Plan: DG CHEST PORT 1 VIEW, DG CHEST PORT 1 VIEW         Electronically Signed   _______________________ Arley Phenix, Psy.D. Clinical Neuropsychologist

## 2020-12-24 NOTE — Progress Notes (Signed)
Bowel program started. Dig stim performed, no stool returned. Suppository inserted. Pt placed on bed pan. Pt does not complain of pain or feeling uncomfortable. Call light in reach. Bed alarm on. Family at bedside. Handoff given to night nurse. Mylo Red, LPN

## 2020-12-24 NOTE — Progress Notes (Signed)
Patient ID: [name], male   DOB: 11/03/1945, 75 y.o.   MRN: [id number]  SW returned phone call to pt wife [name] [phone]) to confirm role and d/c process. SW informed will f/u tomorrow after team conference after updates.  Cecile Sheerer, MSW, LCSWA Office: [phone] Cell: [phone] Fax: [phone]

## 2020-12-24 NOTE — Progress Notes (Signed)
Occupational Therapy Session Note  Patient Details  Name: [name] MRN: [id number] Date of Birth: [birth year]  Today's Date: 12/24/2020 OT Individual Time: 0930-1040 OT Individual Time Calculation (min): 70 min    Short Term Goals: Week 2:  OT Short Term Goal 1 (Week 2): Pt will assist in UB bathing with AE as needed to complete UB bathing with mod assist OT Short Term Goal 2 (Week 2): Pt will engage in rolling with max assist of one caregiver to decrease burden of care with toileting OT Short Term Goal 3 (Week 2): Pt will maintain static sitting balance 2 mins with min assist in preparation for self-care tasks.  Skilled Therapeutic Interventions/Progress Updates:    Pt resting in bed upon arrival. Rolling R/L with tot A +2. Dependent for LB dressing and donning Ted hose. Supine>sit EOB with tot A+2. Sitting balance with tot A EOB to doff/don shirt. Tot A for donning pull over shirt. Pt inititates pulling sleeves up on elbow but requires assistance to complete task. SB tranfser to w/c with tot A+2. Pt presented with wash cloth and able to partially wash face. Max A for brushing teeth with u-cuff. Support at elbow. Pt remained in TIS w/c with all needs within reach. Sip n puff call bell in place. iPhone positioned where pt can voice activate.   Therapy Documentation Precautions:  Precautions Precautions: Back Precaution Comments: pathologic thoracic fracture- back precautions for safety Restrictions Weight Bearing Restrictions: No  Pain: Pain Assessment Pain Scale: 0-10 Pain Score: 0-No pain   Therapy/Group: Individual Therapy  Rich Brave 12/24/2020, 10:46 AM

## 2020-12-25 NOTE — Progress Notes (Signed)
Occupational Therapy Session Note  Patient Details  Name: [name] MRN: [id number] Date of Birth: 28-Mar-1946  Today's Date: 12/25/2020 OT Individual Time: 1400-1430 OT Individual Time Calculation (min): 30 min    Short Term Goals: Week 2:  OT Short Term Goal 1 (Week 2): Pt will assist in UB bathing with AE as needed to complete UB bathing with mod assist OT Short Term Goal 2 (Week 2): Pt will engage in rolling with max assist of one caregiver to decrease burden of care with toileting OT Short Term Goal 3 (Week 2): Pt will maintain static sitting balance 2 mins with min assist in preparation for self-care tasks.  Skilled Therapeutic Interventions/Progress Updates:    Pt resting in PWC with PT present. Adjustments made to [facility] for head rest and foot rest to better accommodate pt. Pt unable to test drive PWC today 2/2 missing appropriate controls. Will test drive tomorrow. Pt returned to room and pt exhibited RUE shoulder flexion when adjusting pillow behind head. Pt remained in w/c with all needs within reach. Pt able to activate sip n puff call bell.   Therapy Documentation Precautions:  Precautions Precautions: Back Precaution Comments: pathologic thoracic fracture- back precautions for safety Restrictions Weight Bearing Restrictions: No Pain:  Pt denies pain this afternoon   Therapy/Group: Individual Therapy  Rich Brave 12/25/2020, 2:38 PM

## 2020-12-25 NOTE — Patient Care Conference (Signed)
Inpatient RehabilitationTeam Conference and Plan of Care Update Date: 12/25/2020   Time: 11:08 AM    Patient Name: [name]      Medical Record Number: [id number]  Date of Birth: 1945-08-23 Sex: Male         Room/Bed: 4M08C/4M08C-01 Payor Info: Payor: MEDICARE / Plan: MEDICARE PART A AND B / Product Type: *No Product type* /    Admit Date/Time:  12/13/2020  6:41 PM  Primary Diagnosis:  Quadriplegia [facility])  Hospital Problems: Principal Problem:   Quadriplegia (HCC) Active Problems:   Neurogenic bowel   Neurogenic bladder   Scrotal swelling   Foley catheter in place on admission   Dysphagia   Acute lower UTI   Labile blood pressure    Expected Discharge Date: Expected Discharge Date: 01/10/21  Team Members Present: Physician leading conference: Dr. Genice Rouge Social Worker Present: Cecile Sheerer, LCSWA Nurse Present: Kennyth Arnold, RN PT Present: Midge Minium, PT OT Present: Ardis Rowan, COTA;Jennifer Katrinka Blazing, OT SLP Present: Colin Benton, SLP PPS Coordinator present : Edson Snowball, PT     Current Status/Progress Goal Weekly Team Focus  Bowel/Bladder   foley in place, bowel program  Pt gain cont of B/B.  toilet pt as needed   Swallow/Nutrition/ Hydration   Regular textures, thin liquids mod I for swallow strategies - tolerating well!  Mod I  Tolerance of current diet   ADL's   bathing-dependent (night bath); UB dressing-tot A: LB dressing-dependent; bed mobility-tot A+2; sitting balance-tot A; independent with directing care  mod A overall  bed mobility, sitting balance, BADL retraining, education; activity tolerance   Mobility   max A for rolling, +2 total A supine to sit and slide board transfers, sitting up in TIS >2 hours, sitting balance EOM tolerated 2 minutes before needing to lean back and rest  total A transfers, mod I PWC mobility  sitting balance, SCI education, initiate power w/c   Communication   Sup A - still reduced, but improving vocal intensity   mod I  Breath support strategies, RMT (expiratory right now - tolerating well!)   Safety/Cognition/ Behavioral Observations  sup A  mod I  Complex problem solving   Pain   Tylenol effective  Pt to remain pain free or comfortbale at a pain scale < 3  assess pain q 4hr and prn   Skin   CDI  Pt skin to remain intact  assess skin q shift and prn     Discharge Planning:  Pt will d/c to home with 24/7 care from pt wife.   Team Discussion: Order abdominal binder, not for BP support. Severe nasal congestion, foley vs. cathing. 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 Will continue foley for now. On bowel program, Tylenol effective for pain. Patient is a quad and total assist. Total assist +2 for bed mobility, transfers, ADL's, and positioning. Start family education next week, may need family conference. Max assist for rolling, will trial power WC this week. On a regular diet, thin liquids. RMT training, complex problem solving.  Patient on target to meet rehab goals: no, patient is mod assist overall for ADL's, total assist for transfers, mod I for power wheelchair mobility.  *See Care Plan and progress notes for long and short-term goals.   Revisions to Treatment Plan:  Continue foley.  Teaching Needs: Family education, medication management, pain management, skin/wound care, equipment training, transfer training, balance training, endurance training, power wheelchair training, safety awareness.  Current Barriers to Discharge: Decreased caregiver support, Medical stability, Home  enviroment access/layout, Incontinence, Neurogenic bowel and bladder, Lack of/limited family support, Weight, Weight bearing restrictions, and Medication compliance  Possible Resolutions to Barriers: Continue current medications, provide emotional support.     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  Barriers to Discharge: Decreased family/caregiver support;Home enviroment access/layout;Incontinence;Medical stability;Neurogenic Bowel & Bladder;Weight bearing restrictions  Barriers to Discharge Comments: only wife and son nearby, but works full time and daughter lives in [place] Possible Resolutions to [employer] Focus: focus- AD and  family ed; ordered abd binder to compensate for lack of muscle tone of core; needs family training done; also d/w nursing about AD and d/w pt about cathing vs foley; SLP- regular/thin diet; resp muscle training; complex problem solving;  trialing power w/c. - 01/10/21   Continued Need for Acute Rehabilitation Level of Care: The patient requires daily medical management by a physician with specialized training in physical medicine and rehabilitation for the following reasons: Direction of a multidisciplinary physical rehabilitation program to maximize functional independence : Yes Medical management of patient stability for increased activity during participation in an intensive rehabilitation regime.: Yes Analysis of laboratory values and/or radiology reports with any subsequent need for medication adjustment and/or medical intervention. : Yes   I attest that I was present, lead the team conference, and concur with the assessment and plan of the team.   Tennis Must 12/25/2020, 3:54 PM

## 2020-12-25 NOTE — Progress Notes (Signed)
Orthopedic Tech Progress Note Patient Details:  [name] 1946-04-10 [id number]  RN called requesting an ABDOMINAL BINDER. Handed it to OT who was coming out of the room  Ortho Devices Type of Ortho Device: Abdominal binder Ortho Device/Splint Location: STOMACH Ortho Device/Splint Interventions: Ordered   Post Interventions Patient Tolerated: Well Instructions Provided: Care of device  [name] 12/25/2020, 11:59 AM

## 2020-12-25 NOTE — Progress Notes (Signed)
Occupational Therapy Session Note  Patient Details  Name: [name] MRN: [id number] Date of Birth: Sep 06, 1945  Today's Date: 12/25/2020 OT Individual Time: 0930-1055 OT Individual Time Calculation (min): 85 min    Short Term Goals: Week 2:  OT Short Term Goal 1 (Week 2): Pt will assist in UB bathing with AE as needed to complete UB bathing with mod assist OT Short Term Goal 2 (Week 2): Pt will engage in rolling with max assist of one caregiver to decrease burden of care with toileting OT Short Term Goal 3 (Week 2): Pt will maintain static sitting balance 2 mins with min assist in preparation for self-care tasks.  Skilled Therapeutic Interventions/Progress Updates:    Pt resting in bed upon arrival. Pt stated he didn't sleep well during night and somewhat groggy at first but improved as session went along. OT intervention with focus on bed mobility, sitting balance, UB dressing, SB transfers, and activity tolerance. Rolling R/L in bed with tot A+2 to facilitate pulling pants over hips. TOT A for LB dressing tasks. Supine>sit EOB with tot A+2. SB transfer with Tot A+2. Pt initiated threading BUE into sleeves but required assistance to complete task. Pt remained in w/c with all needs within reach. Sip n puff call bell and iPhone properly positioned and pt able to activate call bell.   Therapy Documentation Precautions:  Precautions Precautions: Back Precaution Comments: pathologic thoracic fracture- back precautions for safety Restrictions Weight Bearing Restrictions: No    Pain: Pain Assessment Pain Scale: 0-10 Pain Score: 0-No pain       Therapy/Group: Individual Therapy  Rich Brave 12/25/2020, 10:56 AM

## 2020-12-25 NOTE — Progress Notes (Signed)
PROGRESS NOTE   Subjective/Complaints: Pt reportd working on straightening fingers- practicing Bowel program working- good BM last night.  Occ B/L shoulder pain- tylenol helps.  Also clogged nose last night- lasted a few hours and multiple saline sprays into nose.  My concern for AD- BP wasn't checked until 5:30am- so could have had AD, since Sx's started ~ midnight.   ROS:  Pt denies SOB, abd pain, CP, N/V/C/D, and vision changes  Objective:   No results found. Recent Labs    12/24/20 0554  WBC 6.6  HGB 12.1*  HCT 36.2*  PLT 281   Recent Labs    12/24/20 0554  NA 138  K 4.3  CL 107  CO2 23  GLUCOSE 94  BUN 16  CREATININE 0.67  CALCIUM 8.7*    Intake/Output Summary (Last 24 hours) at 12/25/2020 0837 Last data filed at 12/25/2020 0500 Gross per 24 hour  Intake 318 ml  Output 1800 ml  Net -1482 ml        Physical Exam: Vital Signs Blood pressure 117/66, pulse 71, temperature 99.1 F (37.3 C), temperature source Oral, resp. rate 16, height 5' 8.5" (1.74 m), weight 73.2 kg, SpO2 96 %.   General: awake, alert, appropriate, sitting up somewhat in bed; sip and puff call bell available; NAD HENT: conjugate gaze; oropharynx moist- not clogged up/congested CV: regular rate; no JVD Pulmonary: CTA B/L; no W/R/R- good air movement GI: soft, NT, ND, (+)BS Psychiatric: appropriate; quiet, but interactive Neurological: Alert; has MAS of 1 to 1+ in arms B/L   Motor RUE: biceps 4/5, WE 3-/5, triceps 2-/5, grip 2-/5, finger abd 0/5, unchanged LUE: biceps 4/5, WE 3-/5, triceps 0/5, grip 1/5, and finger abd 0/5,stable LE's 0/5 in HF, KE, DF, APF. Senses gross touch in both legs.   Assessment/Plan: 1. Functional deficits which require 3+ hours per day of interdisciplinary therapy in a comprehensive inpatient rehab setting. Physiatrist is providing close team supervision and 24 hour management of active medical  problems listed below. Physiatrist and rehab team continue to assess barriers to discharge/monitor patient progress toward functional and medical goals  Care Tool:  Bathing        Body parts bathed by helper: Right arm, Left arm, Chest, Abdomen, Front perineal area, Buttocks, Right upper leg, Left upper leg, Right lower leg, Left lower leg, Face     Bathing assist Assist Level: 2 Helpers     Upper Body Dressing/Undressing Upper body dressing   What is the patient wearing?: Pull over shirt    Upper body assist Assist Level: Total Assistance - Patient < 25%    Lower Body Dressing/Undressing Lower body dressing      What is the patient wearing?: Pants     Lower body assist Assist for lower body dressing: 2 Helpers     Toileting Toileting    Toileting assist Assist for toileting: 2 Helpers     Transfers Chair/bed transfer  Transfers assist  Chair/bed transfer activity did not occur: Safety/medical concerns  Chair/bed transfer assist level: 2 Helpers     Locomotion Ambulation   Ambulation assist   Ambulation activity did not occur: Safety/medical concerns  Walk 10 feet activity   Assist  Walk 10 feet activity did not occur: Safety/medical concerns        Walk 50 feet activity   Assist Walk 50 feet with 2 turns activity did not occur: Safety/medical concerns         Walk 150 feet activity   Assist Walk 150 feet activity did not occur: Safety/medical concerns         Walk 10 feet on uneven surface  activity   Assist Walk 10 feet on uneven surfaces activity did not occur: Safety/medical concerns         Wheelchair     Assist Is the patient using a wheelchair?: Yes Type of Wheelchair: Power Wheelchair activity did not occur: Safety/medical concerns (limited tolerance for upright)         Wheelchair 50 feet with 2 turns activity    Assist    Wheelchair 50 feet with 2 turns activity did not occur:  Safety/medical concerns       Wheelchair 150 feet activity     Assist  Wheelchair 150 feet activity did not occur: Safety/medical concerns       Blood pressure 117/66, pulse 71, temperature 99.1 F (37.3 C), temperature source Oral, resp. rate 16, height 5' 8.5" (1.74 m), weight 73.2 kg, SpO2 96 %.  Medical Problem List and Plan: 1.  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 Pt also with lesion involving T1 vertebral body and right side posterior elements with associated pathologic vertebral fracture concerning for metastatic disease (?prostate) or other neoplasm. 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  Status post plan loop recorder  -turn q2-3 hours to reduce risk of pressure ulcers.  --con't PT and OT/CIR- and educated on autonomic dysreflexia/AD- today- that nasal congestion is sign of AD esp isolated or with Headache, anxiety, sense of impending doom.  2.  Antithrombotics: -DVT/anticoagulation: Venous Doppler studies negative.  Continue Lovenox Pharmaceutical: Lovenox             -antiplatelet therapy: Aspirin 81 mg daily and Plavix 75 mg day x3 weeks and aspirin alone 3. Pain Management: Tylenol   - Lidoderm patches 8pm to 8am -3 of them -kpad for shoulder pain, encouraged use of tylenol 8/23- pain controlled with lidocaine patches and tylenol prn 4. Mood/depression/insomnia: Provide emotional support  Celexa 10 mg daily started on 8/12  -melatonin for sleep  -neuropsych intervention would be helpful for him             -antipsychotic agents: N/A 5. Neuropsych: This patient is capable of making decisions on his own behalf. 6. Skin/Wound Care: Routine skin checks 7. Fluids/Electrolytes/Nutrition: Routine in and outs 8.  Thrombocytopenia.  Resolved 9.  Neurogenic bowel bladder.  Foley catheter tube has been placed for multiple in and out catheterization Lidocaine jelly for bowel  program.  8/15- con't Foley due to enlarged scrotum- would be hard to cath- likely won't void-  - bowel program- with suppository/dig stim prn per wife's request 8/23- bowel program last night- good results- still has foley due to enlarged scrotum, but will d/w pt/family more about chronic foley? 10.  Elevated PSA/enlarged prostate.  Family currently not committed to oncology work-up.  Family has asked [place] to not discuss cancer possibility while here   12. Scrotal swelling- will elevate and Hospitalist is checking pro-BNP and giving a dose of Lasix.  Will monitor/follow.  13. Dysphagia- on D3 thin diet- will con't SLP for  swallowing therapy.  14. Resp issues-  Flutter valve- also con't ICS if desired.  15. Leukocytosis-UTI Completed macrobid 16.  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   LOS: 12 days A FACE TO FACE EVALUATION WAS PERFORMED  Truitt Cruey 12/25/2020, 8:37 AM

## 2020-12-25 NOTE — Progress Notes (Signed)
Patient ID: [name], male   DOB: Jun 06, 1945, 75 y.o.   MRN: [id number]  SW called pt wife [name] to provide updates from team conference, and d/c date 9/8. Fam edu scheduled for Monday (8/29) 9am-12pm.  *SW met with pt in room to provide updates on above.   Loralee Pacas, MSW, [place] Office: [phone] Cell: [phone] Fax: [phone]

## 2020-12-25 NOTE — Progress Notes (Signed)
Bowel program completed. Patient had a large bowel movement.

## 2020-12-25 NOTE — Progress Notes (Signed)
Occupational Therapy Session Note  Patient Details  Name: [name] MRN: [id number] Date of Birth: [birth year]  Today's Date: 12/25/2020 OT Individual Time: 1135-1200 OT Individual Time Calculation (min): 25 min    Short Term Goals: Week 2:  OT Short Term Goal 1 (Week 2): Pt will assist in UB bathing with AE as needed to complete UB bathing with mod assist OT Short Term Goal 2 (Week 2): Pt will engage in rolling with max assist of one caregiver to decrease burden of care with toileting OT Short Term Goal 3 (Week 2): Pt will maintain static sitting balance 2 mins with min assist in preparation for self-care tasks.  Skilled Therapeutic Interventions/Progress Updates:    OT intervention with focus on oral care and washing face while seated in TIS w/c. Pt required max A for oral care using u-cuff to hold tooth brush. Support provided at elbow. Pt able to bring wash cloth to face but unable to grasp to wash face. Tot A. Pt remained in w/c with all needs within reach. Pt able to activate sip n puff call bell.   Therapy Documentation Precautions:  Precautions Precautions: Back Precaution Comments: pathologic thoracic fracture- back precautions for safety Restrictions Weight Bearing Restrictions: No   Pain:  Pt denies pain this morning   Therapy/Group: Individual Therapy  Rich Brave 12/25/2020, 12:16 PM

## 2020-12-25 NOTE — Progress Notes (Signed)
Physical Therapy Session Note  Patient Details  Name: [name] MRN: [id number] Date of Birth: [birth year]  Today's Date: 12/25/2020 PT Individual Time: 1300-1400 PT Individual Time Calculation (min): 60 min   Short Term Goals: Week 1:  PT Short Term Goal 1 (Week 1): Pt will tolerate at least  1 hr periods OOB in wc PT Short Term Goal 1 - Progress (Week 1): Met PT Short Term Goal 2 (Week 1): Pt will verbalize understanding of importance of and recite instructed  pressure relief schedule PT Short Term Goal 2 - Progress (Week 1): Met PT Short Term Goal 3 (Week 1): Pt will tolerate 10 min sitting balance activity prior to requiring supported rest breaks. PT Short Term Goal 3 - Progress (Week 1): Progressing toward goal PT Short Term Goal 4 (Week 1): Pt will participate in rolling via UEs and cervical motion. PT Short Term Goal 4 - Progress (Week 1): Met PT Short Term Goal 5 (Week 1): Pt will verbalize importance of rolling x 2 hrs to prevent pressure injuries  when in bed. PT Short Term Goal 5 - Progress (Week 1): Met Week 2:  PT Short Term Goal 1 (Week 2): Patient will initiate power wheelchair mobility. PT Short Term Goal 2 (Week 2): Patient will direct slide board transfers 50% of tasks. PT Short Term Goal 3 (Week 2): Patient will tolerate EOB balance activites for 20 minutes. PT Short Term Goal 4 (Week 2): Patient will tolerate 10 minutes sitting balance activity prior to needing supported rest breaks. Week 3:     Skilled Therapeutic Interventions/Progress Updates:   PAIN Pt c/o cervical pain which is relieved w/repositioning of wc headrest, TIS adjustment.  Pt initially oob in TIS wc and  Agreeable to training w/PWC.  Therapist obtained PWC, adjustements made included headreast positioning, switching joystick control to R side/pt stronger UE, and transferring roho to [place].   Remaining adjustments required include: Shorteneing legrests Obtaining fieldgoal controller (only joystick  available) Pt educated on use of controlls, available options, process for wc perscription.  This therapist contacted vendor to set up consult for Monday 8/29 at 1:00 pm w/primary [employer] and arranged w/schedulers.  TIS to [facility] total assist of 2.  Pt demonstrated ability to utilize joystick w/mod assist for tilting wc fully and returning to upright.  Discussed using this for comfort, pressure relief, caregiver assistance. Will need to trial goalpost and possibly more involved mechanism due to weakness.  Pt attempted to propel wc using joystick, but was unable to utilize this method/requires more support due to hand/UE weakness.   Therapist propelled wc and demonstrated speed options, turning, backing, various positioning options.  Discussed accessibility, transportation of wc, battery charging, pros/cons. Pt handed off to OT in gym for next session.  Therapy Documentation Precautions:  Precautions Precautions: Back Precaution Comments: pathologic thoracic fracture- back precautions for safety Restrictions Weight Bearing Restrictions: No     Therapy/Group: Individual Therapy Callie Fielding, [place] 12/25/2020, 4:39 PM

## 2020-12-26 NOTE — Progress Notes (Signed)
Occupational Therapy Session Note  Patient Details  Name: [name] MRN: [id number] Date of Birth: 10-04-1945  Today's Date: 12/26/2020 OT Individual Time: 1115-1130 OT Individual Time Calculation (min): 15 min    Short Term Goals: Week 2:  OT Short Term Goal 1 (Week 2): Pt will assist in UB bathing with AE as needed to complete UB bathing with mod assist OT Short Term Goal 2 (Week 2): Pt will engage in rolling with max assist of one caregiver to decrease burden of care with toileting OT Short Term Goal 3 (Week 2): Pt will maintain static sitting balance 2 mins with min assist in preparation for self-care tasks.  Skilled Therapeutic Interventions/Progress Updates:    OTA returned to room to make adjustments to PWC, adjusting foot plates to appropriate length. Head rest is missing Allen screw. Will search for replacement. Pt remained seated in TIS w/c with all needs within reach. Pt wanted to "show off" by lowering RUE over side of w/c and placing back on arm rest.   Therapy Documentation Precautions:  Precautions Precautions: Back Precaution Comments: pathologic thoracic fracture- back precautions for safety Restrictions Weight Bearing Restrictions: No   Pain: Pain Assessment Pain Scale: 0-10 Pain Score: 0-No pain Faces Pain Scale: No hurt   Therapy/Group: Individual Therapy  Rich Brave 12/26/2020, 11:53 AM

## 2020-12-26 NOTE — Progress Notes (Signed)
PROGRESS NOTE   Subjective/Complaints:  Pt reports bowel program doing great-  Felt catheter last evening and could "kind of squeeze"- also actually felt BM slightly/lightly last night.  Spasms are annoying, but not interfering in therapy- doesn't want to increase meds.    ROS:   Pt denies SOB, abd pain, CP, N/V/C/D, and vision changes    Objective:   No results found. Recent Labs    12/24/20 0554  WBC 6.6  HGB 12.1*  HCT 36.2*  PLT 281   Recent Labs    12/24/20 0554  NA 138  K 4.3  CL 107  CO2 23  GLUCOSE 94  BUN 16  CREATININE 0.67  CALCIUM 8.7*    Intake/Output Summary (Last 24 hours) at 12/26/2020 1922 Last data filed at 12/26/2020 1905 Gross per 24 hour  Intake 120 ml  Output 1175 ml  Net -1055 ml        Physical Exam: Vital Signs Blood pressure (!) 106/54, pulse (!) 59, temperature (!) 97.5 F (36.4 C), temperature source Oral, resp. rate 18, height 5' 8.5" (1.74 m), weight 73.2 kg, SpO2 97 %.    General: awake, alert, appropriate, sitting up in bed; wife at bedside; sip and puff call bell in reach; NAD HENT: conjugate gaze; oropharynx moist CV: regular rate; no JVD Pulmonary: CTA B/L; no W/R/R- good air movement GI: soft, NT, ND, (+)BS Psychiatric: appropriate Neurological: Ox3; quiet; extensor tone- mild with ROM of LE's   Motor RUE: biceps 4/5, WE 3-/5, triceps 2-/5, grip 2-/5, finger abd 0/5, unchanged LUE: biceps 4/5, WE 3-/5, triceps 0/5, grip 1/5, and finger abd 0/5,stable LE's 0/5 in HF, KE, DF, APF. Senses gross touch in both legs.   Assessment/Plan: 1. Functional deficits which require 3+ hours per day of interdisciplinary therapy in a comprehensive inpatient rehab setting. Physiatrist is providing close team supervision and 24 hour management of active medical problems listed below. Physiatrist and rehab team continue to assess barriers to discharge/monitor patient  progress toward functional and medical goals  Care Tool:  Bathing        Body parts bathed by helper: Right arm, Left arm, Chest, Abdomen, Front perineal area, Buttocks, Right upper leg, Left upper leg, Right lower leg, Left lower leg, Face     Bathing assist Assist Level: 2 Helpers     Upper Body Dressing/Undressing Upper body dressing   What is the patient wearing?: Pull over shirt    Upper body assist Assist Level: Total Assistance - Patient < 25%    Lower Body Dressing/Undressing Lower body dressing      What is the patient wearing?: Pants     Lower body assist Assist for lower body dressing: 2 Helpers     Toileting Toileting    Toileting assist Assist for toileting: 2 Helpers     Transfers Chair/bed transfer  Transfers assist  Chair/bed transfer activity did not occur: Safety/medical concerns  Chair/bed transfer assist level: 2 Helpers     Locomotion Ambulation   Ambulation assist   Ambulation activity did not occur: Safety/medical concerns          Walk 10 feet activity   Assist  Walk  10 feet activity did not occur: Safety/medical concerns        Walk 50 feet activity   Assist Walk 50 feet with 2 turns activity did not occur: Safety/medical concerns         Walk 150 feet activity   Assist Walk 150 feet activity did not occur: Safety/medical concerns         Walk 10 feet on uneven surface  activity   Assist Walk 10 feet on uneven surfaces activity did not occur: Safety/medical concerns         Wheelchair     Assist Is the patient using a wheelchair?: Yes Type of Wheelchair: Power Wheelchair activity did not occur: Safety/medical concerns (limited tolerance for upright)         Wheelchair 50 feet with 2 turns activity    Assist    Wheelchair 50 feet with 2 turns activity did not occur: Safety/medical concerns       Wheelchair 150 feet activity     Assist  Wheelchair 150 feet activity did  not occur: Safety/medical concerns       Blood pressure (!) 106/54, pulse (!) 59, temperature (!) 97.5 F (36.4 C), temperature source Oral, resp. rate 18, height 5' 8.5" (1.74 m), weight 73.2 kg, SpO2 97 %.  Medical Problem List and Plan: 1.  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 Pt also with lesion involving T1 vertebral body and right side posterior elements with associated pathologic vertebral fracture concerning for metastatic disease (?prostate) or other neoplasm. 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  Status post plan loop recorder  -turn q2-3 hours to reduce risk of pressure ulcers.  --con't PT and OT/CIR- and educated on autonomic dysreflexia/AD- today- that nasal congestion is sign of AD esp isolated or with Headache, anxiety, sense of impending doom.  Con't PT and OT/CIR- d/c date 01/10/21 2.  Antithrombotics: -DVT/anticoagulation: Venous Doppler studies negative.  Continue Lovenox Pharmaceutical: Lovenox             -antiplatelet therapy: Aspirin 81 mg daily and Plavix 75 mg day x3 weeks and aspirin alone 3. Pain Management: Tylenol   - Lidoderm patches 8pm to 8am -3 of them -kpad for shoulder pain, encouraged use of tylenol 8/23- pain controlled with lidocaine patches and tylenol prn 4. Mood/depression/insomnia: Provide emotional support  Celexa 10 mg daily started on 8/12  -melatonin for sleep  -neuropsych intervention would be helpful for him             -antipsychotic agents: N/A 5. Neuropsych: This patient is capable of making decisions on his own behalf. 6. Skin/Wound Care: Routine skin checks 7. Fluids/Electrolytes/Nutrition: Routine in and outs 8.  Thrombocytopenia.  Resolved 9.  Neurogenic bowel bladder.  Foley catheter tube has been placed for multiple in and out catheterization Lidocaine jelly for bowel program.  8/15- con't Foley due to enlarged scrotum- would be  hard to cath- likely won't void-  - bowel program- with suppository/dig stim prn per wife's request 8/23- bowel program last night- good results- still has foley due to enlarged scrotum, but will d/w pt/family more about chronic foley? 10.  Elevated PSA/enlarged prostate.  Family currently not committed to oncology work-up.  Family has asked [place] to not discuss cancer possibility while here   12. Scrotal swelling- will elevate and Hospitalist is checking pro-BNP and giving a dose of Lasix.  Will monitor/follow.  13. Dysphagia- on D3 thin diet- will con't SLP  for swallowing therapy.  14. Resp issues-  Flutter valve- also con't ICS if desired.  15. Leukocytosis-UTI Completed macrobid 16.  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 Spasticity  8/24- pt doesn't want to increase spasticity meds as of yet.     LOS: 13 days A FACE TO FACE EVALUATION WAS PERFORMED  Katharina Jehle 12/26/2020, 7:22 PM

## 2020-12-26 NOTE — Progress Notes (Signed)
Physical Therapy Session Note  Patient Details  Name: [name] MRN: [id number] Date of Birth: 06-24-1945  Today's Date: 12/26/2020 PT Individual Time: 7414-2395 PT Individual Time Calculation (min): 73 min   Short Term Goals: Week 2:  PT Short Term Goal 1 (Week 2): Patient will initiate power wheelchair mobility. PT Short Term Goal 2 (Week 2): Patient will direct slide board transfers 50% of tasks. PT Short Term Goal 3 (Week 2): Patient will tolerate EOB balance activites for 20 minutes. PT Short Term Goal 4 (Week 2): Patient will tolerate 10 minutes sitting balance activity prior to needing supported rest breaks.  Skilled Therapeutic Interventions/Progress Updates:    Patient in tilt in space w/c.  Noted set up for power w/c in the room.  Patient transported in TIS to therapy gym.  Performed SBT to mat total A +2 for safety.  Patient seated to work on reaching with tenodesis for grasp with lego blocks to slide hand over item, move to new location and release.  Patient needing mod to max support for balance during activity.  Sitting upright for about 4 minutes prior to needing to lean back to rest.  Sit to supine total A +2.  Supine for LE therex stretching hip extensors, hip rotators, hamstrings and heel cords.  Rolling to L with max A and side to sit total A.  Patient transferred SBT to power w/c +2 total A.  Patient assisted with R UE positioning min A while pt propelling w/c over 180' including turns.  Patient performed boost in w/c with cues and assist to change mode.  Discussion about need for w/c eval set up for Monday.  Patient relates plans to get manual tilt in space since he plans to not stay in the wheelchair and work on walking.  Discussed his motivations and what keeps him encouraged, but that spinal cord injuries usually do not have that level of recovery and this chair will stay with him at least 5 years.  Patient continued to report plans for manual chair.  Patient propelled to  his room x another 150' with min a for R UE positioning due to PVC pipe used on joystick as no goal post available.  Patient transferred to bed with +2 total A SBT.  To supine +2 total A.  Left supine with sip n puff call bell and cell phone accessible and bed alarm active.    Therapy Documentation Precautions:  Precautions Precautions: Back Precaution Comments: pathologic thoracic fracture- back precautions for safety Restrictions Weight Bearing Restrictions: No  Pain: Pain Assessment Pain Scale: 0-10 Pain Score: 0-No pain Faces Pain Scale: No hurt    Therapy/Group: Individual Therapy  Elray Mcgregor [place], [place] 12/26/2020, 12:24 PM

## 2020-12-26 NOTE — Progress Notes (Signed)
Occupational Therapy Session Note  Patient Details  Name: [name] MRN: [id number] Date of Birth: 09-09-1945  Today's Date: 12/26/2020 OT Individual Time: 1135-1201 OT Individual Time Calculation (min): 26 min    Short Term Goals: Week 2:  OT Short Term Goal 1 (Week 2): Pt will assist in UB bathing with AE as needed to complete UB bathing with mod assist OT Short Term Goal 2 (Week 2): Pt will engage in rolling with max assist of one caregiver to decrease burden of care with toileting OT Short Term Goal 3 (Week 2): Pt will maintain static sitting balance 2 mins with min assist in preparation for self-care tasks.  Skilled Therapeutic Interventions/Progress Updates:    Treatment session with focus on BUE AAROM.  Pt received tilted back in TIS w/c agreeable to therapy session.  Discussed progress since initial eval completed with this therapist and use of adaptive technology for increased communication.  Engaged in [place] with focus on shoulder elevation and elbow flexion to increase success with self-feeding, oral care, and washing face.  Increased challenge for reaching across midline bilaterally with elbow extension, pt demonstrating bicep contractions when attempting to reach across midline with RUE, requiring hand over hand assist with reaching with LUE.  Pt reports continuing to be motivated to progress with therapy. Pt remained tilted back in TIS w/c with all needs in reach.  Therapy Documentation Precautions:  Precautions Precautions: Back Precaution Comments: pathologic thoracic fracture- back precautions for safety Restrictions Weight Bearing Restrictions: No  Pain: Pain Assessment Pain Scale: 0-10 Pain Score: 0-No pain Faces Pain Scale: No hurt   Therapy/Group: Individual Therapy  Rosalio Loud 12/26/2020, 12:12 PM

## 2020-12-26 NOTE — Progress Notes (Signed)
Occupational Therapy Session Note  Patient Details  Name: [name] MRN: [id number] Date of Birth: [birth year]  Today's Date: 12/26/2020 OT Individual Time: 1300-1345 OT Individual Time Calculation (min): 45 min    Short Term Goals: Week 2:  OT Short Term Goal 1 (Week 2): Pt will assist in UB bathing with AE as needed to complete UB bathing with mod assist OT Short Term Goal 2 (Week 2): Pt will engage in rolling with max assist of one caregiver to decrease burden of care with toileting OT Short Term Goal 3 (Week 2): Pt will maintain static sitting balance 2 mins with min assist in preparation for self-care tasks.  Skilled Therapeutic Interventions/Progress Updates:    Pt resting in TIS w/c upon arrival. OT intervention with focus on BUE AROM and AAROM with emphasis on elbow extension and shoulder flexion. RUE with more active movement then LUE. Pt performed punches and diagonal punches, shoulder flexion reaching for ceiling, and shoulder extension over side of w/c. Pt also began working on tenodesis with foam cube. Pt positioned in w/c with sip-n-puff and iPhone postioned when pt can access. Pt can activate sip-n-puff call bell.   Therapy Documentation Precautions:  Precautions Precautions: Back Precaution Comments: pathologic thoracic fracture- back precautions for safety Restrictions Weight Bearing Restrictions: No   Pain: Pain Assessment Pain Scale: 0-10 Pain Score: 0-No pain Faces Pain Scale: No hurt    Therapy/Group: Individual Therapy  Rich Brave 12/26/2020, 1:54 PM

## 2020-12-26 NOTE — Progress Notes (Signed)
Occupational Therapy Session Note  Patient Details  Name: [name] MRN: [id number] Date of Birth: November 03, 1945  Today's Date: 12/26/2020 OT Individual Time: 8756-4332 OT Individual Time Calculation (min): 75 min    Short Term Goals: Week 2:  OT Short Term Goal 1 (Week 2): Pt will assist in UB bathing with AE as needed to complete UB bathing with mod assist OT Short Term Goal 2 (Week 2): Pt will engage in rolling with max assist of one caregiver to decrease burden of care with toileting OT Short Term Goal 3 (Week 2): Pt will maintain static sitting balance 2 mins with min assist in preparation for self-care tasks.  Skilled Therapeutic Interventions/Progress Updates:    Pt resting in bed upon arrival and ready to "start the day." OT intervention with focus on bed mobility, sitting balance, SB transfers, UB dressing, oral care, and directing care. Rolling R/L in bed with tot A. LB dressing dependent at bed level. Supine>sit EOB with tot A+2. Sitting balance EOB with tot A while doffing/donning pullover shirt and abdominal binder. SB transfer to TIS w/c tot A+2. Tot A for positioning in w/c. Pt engaged in oral care using u-cuff to hold tooth brush. Support initially provided at elbow but pt able to maintain position and "brush" teeth with mod A for thoroughness. Pt able to wipe mouth with wash cloth after brushing teeth. Pt pleased with new skill. Pt positioned in w/c and sip-n-puff call bell positioned along with iPhone for pt to access. Pt able to activate call bell.   Therapy Documentation Precautions:  Precautions Precautions: Back Precaution Comments: pathologic thoracic fracture- back precautions for safety Restrictions Weight Bearing Restrictions: No   Pain: Pain Assessment Pain Scale: 0-10 Pain Score: 0-No pain Faces Pain Scale: No hurt   Therapy/Group: Individual Therapy  Rich Brave 12/26/2020, 11:47 AM

## 2020-12-27 ENCOUNTER — Ambulatory Visit: Payer: Medicare Other

## 2020-12-27 NOTE — Progress Notes (Signed)
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 Carelink reviewed today and he is transmitting and as of today, no events/observations  Tegaderm remains in place Dressing/steri strips removed without difficulty Site looks good Well healed with minimal surrounding ecchymosis No hematoma, no erythema, edema or heat to the tissues No signs of infection Transmitter is at bedside  Francis Dowse, PA-C

## 2020-12-27 NOTE — Progress Notes (Signed)
Occupational Therapy Session Note  Patient Details  Name: [name] MRN: [id number] Date of Birth: 19-Aug-1945  Today's Date: 12/27/2020 OT Individual Time: 1345-1415 OT Individual Time Calculation (min): 30 min    Short Term Goals: Week 3:  OT Short Term Goal 1 (Week 3): Pt will assist in UB bathing with AE as needed to complete UB bathing with mod assist OT Short Term Goal 2 (Week 3): Pt will engage in rolling with max assist of one caregiver to decrease burden of care with toileting OT Short Term Goal 3 (Week 3): Pt will maintain static sitting balance 2 mins with min assist in preparation for self-care tasks.  Skilled Therapeutic Interventions/Progress Updates:    Pt resting in PWC upon arrival. Pt stated his BP was low when NT assessed and he was feeling a little woozy. BP reassessed-115/69 HR 55. SB tranfser back to bed with tot A+2. Pt repositioned in bed with sip-n-puff call bell and iPhone positioned where pt can activated. RN Morrie Sheldon notified of pt's status and that he was back in bed.   Therapy Documentation Precautions:  Precautions Precautions: Back Precaution Comments: pathologic thoracic fracture- back precautions for safety Restrictions Weight Bearing Restrictions: No    Vital Signs: Therapy Vitals Temp: 97.7 F (36.5 C) Temp Source: Oral Pulse Rate: (!) 56 Resp: 17 BP: 96/61 Patient Position (if appropriate): Sitting Oxygen Therapy SpO2: 98 % O2 Device: Room Air Pain:  Pt denies pain this afternoon   Therapy/Group: Individual Therapy  Rich Brave 12/27/2020, 2:27 PM

## 2020-12-27 NOTE — Progress Notes (Signed)
Speech Language Pathology Daily Session Note  Patient Details  Name: [name] MRN: [id number] Date of Birth: 10/20/1945  Today's Date: 12/27/2020 SLP Individual Time: 0830-0900 SLP Individual Time Calculation (min): 30 min  Short Term Goals: Week 2: SLP Short Term Goal 1 (Week 2): Patient will complete 25 repetitions of IMT/EMT at 50% of MEP with 80% or better accuracy and a self perceived level of effort rating #7 or less on a 0-10 scale SLP Short Term Goal 2 (Week 2): Patient will tolerate current diet without overt s/sx of aspiration and implement safe swallow precautions with mod I SLP Short Term Goal 3 (Week 2): Patient will complete complex problem solving with mod I  Skilled Therapeutic Interventions:   Patient seen for skilled ST session with focus on speech/voice goals. Patient reporting to SLP that when OT puts on "I call it a girdle" it helps with his strength in voice and breathing. Patient also reports he continues to work on [employer] device and his wife helps him when she visits in late afternoons. Patient was able to perform 3 sets of 10 with EMST set at 14cm H2O resistance with a difficulty rating of 6/10. SLP had him trial increasing to 15 cm H2O and patient reported a difficulty rating of 7/10 and that he wished to keep device at this higher setting. Patient continues to benefit from skilled SLP intervention to maximize speech/voice, and dysphagia goals prior to discharge.   Pain Pain Assessment Pain Scale: 0-10 Faces Pain Scale: No hurt  Therapy/Group: Individual Therapy  Angela Nevin, MA, CCC-SLP Speech Therapy

## 2020-12-27 NOTE — Progress Notes (Signed)
PROGRESS NOTE   Subjective/Complaints:  Pt reports still having spasms when legs are touched however usually subsides after that-  Worried that wife doesn't want power w/c- and he isn't sure he needs it- I went over that at this time, he cannot independently propel a w/c or do pressure relief to prevent a pressure ulcer- so it's needed to help him be independent- if he gets better to manual w/c level in future, great, and exciting, but don't want him getting hurt before then.   ROS:   Pt denies SOB, abd pain, CP, N/V/C/D, and vision changes      Objective:   No results found. No results for input(s): WBC, HGB, HCT, PLT in the last 72 hours.  No results for input(s): NA, K, CL, CO2, GLUCOSE, BUN, CREATININE, CALCIUM in the last 72 hours.   Intake/Output Summary (Last 24 hours) at 12/27/2020 1034 Last data filed at 12/27/2020 0827 Gross per 24 hour  Intake 340 ml  Output 1200 ml  Net -860 ml        Physical Exam: Vital Signs Blood pressure 132/68, pulse (!) 58, temperature 99.1 F (37.3 C), temperature source Oral, resp. rate 16, height 5' 8.5" (1.74 m), weight 73.2 kg, SpO2 96 %.     General: awake, alert, appropriate,  sitting up slightly in bed; sip and puff available; NAD HENT: conjugate gaze; oropharynx moist CV: regular rate; no JVD Pulmonary: CTA B/L; no W/R/R- good air movement GI: soft, NT, ND, (+)BS Psychiatric: appropriate; quiet;  Neurological: Multiple spasms with simple touch or ROM of legs.  GU: scrotal swelling resolved- foley in place Motor RUE: biceps 4/5, WE 3-/5, triceps 2-/5, grip 2-/5, finger abd 0/5, unchanged LUE: biceps 4/5, WE 3-/5, triceps 0/5, grip 1/5, and finger abd 0/5,stable LE's 0/5 in HF, KE, DF, APF. Senses gross touch in both legs.   Assessment/Plan: 1. Functional deficits which require 3+ hours per day of interdisciplinary therapy in a comprehensive inpatient rehab  setting. Physiatrist is providing close team supervision and 24 hour management of active medical problems listed below. Physiatrist and rehab team continue to assess barriers to discharge/monitor patient progress toward functional and medical goals  Care Tool:  Bathing        Body parts bathed by helper: Right arm, Left arm, Chest, Abdomen, Front perineal area, Buttocks, Right upper leg, Left upper leg, Right lower leg, Left lower leg, Face     Bathing assist Assist Level: 2 Helpers     Upper Body Dressing/Undressing Upper body dressing   What is the patient wearing?: Pull over shirt    Upper body assist Assist Level: Total Assistance - Patient < 25%    Lower Body Dressing/Undressing Lower body dressing      What is the patient wearing?: Pants     Lower body assist Assist for lower body dressing: 2 Helpers     Toileting Toileting    Toileting assist Assist for toileting: 2 Helpers     Transfers Chair/bed transfer  Transfers assist  Chair/bed transfer activity did not occur: Safety/medical concerns  Chair/bed transfer assist level: 2 Helpers     Locomotion Ambulation   Ambulation assist  Ambulation activity did not occur: Safety/medical concerns          Walk 10 feet activity   Assist  Walk 10 feet activity did not occur: Safety/medical concerns        Walk 50 feet activity   Assist Walk 50 feet with 2 turns activity did not occur: Safety/medical concerns         Walk 150 feet activity   Assist Walk 150 feet activity did not occur: Safety/medical concerns         Walk 10 feet on uneven surface  activity   Assist Walk 10 feet on uneven surfaces activity did not occur: Safety/medical concerns         Wheelchair     Assist Is the patient using a wheelchair?: Yes Type of Wheelchair: Power Wheelchair activity did not occur: Safety/medical concerns (limited tolerance for upright)         Wheelchair 50 feet with 2  turns activity    Assist    Wheelchair 50 feet with 2 turns activity did not occur: Safety/medical concerns       Wheelchair 150 feet activity     Assist  Wheelchair 150 feet activity did not occur: Safety/medical concerns       Blood pressure 132/68, pulse (!) 58, temperature 99.1 F (37.3 C), temperature source Oral, resp. rate 16, height 5' 8.5" (1.74 m), weight 73.2 kg, SpO2 96 %.  Medical Problem List and Plan: 1.  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 Pt also with lesion involving T1 vertebral body and right side posterior elements with associated pathologic vertebral fracture concerning for metastatic disease (?prostate) or other neoplasm. 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  Status post plan loop recorder  -turn q2-3 hours to reduce risk of pressure ulcers.  --con't PT and OT/CIR- and educated on autonomic dysreflexia/AD- today- that nasal congestion is sign of AD esp isolated or with Headache, anxiety, sense of impending doom.  Con't PT and OT- working on power w/c- need to d/w family need for power w/c at least for now;  2.  Antithrombotics: -DVT/anticoagulation: Venous Doppler studies negative.  Continue Lovenox [employer]: Lovenox 8/25- needs for a minimum of 3 months from injury             -antiplatelet therapy: Aspirin 81 mg daily and Plavix 75 mg day x3 weeks and aspirin alone 3. Pain Management: Tylenol   - Lidoderm patches 8pm to 8am -3 of them -kpad for shoulder pain, encouraged use of tylenol 8/23- pain controlled with lidocaine patches and tylenol prn 4. Mood/depression/insomnia: Provide emotional support  Celexa 10 mg daily started on 8/12  -melatonin for sleep  -neuropsych intervention would be helpful for him             -antipsychotic agents: N/A 5. Neuropsych: This patient is capable of making decisions on his own behalf. 6. Skin/Wound Care:  Routine skin checks 7. Fluids/Electrolytes/Nutrition: Routine in and outs 8.  Thrombocytopenia.  Resolved 9.  Neurogenic bowel bladder.  Foley catheter tube has been placed for multiple in and out catheterization Lidocaine jelly for bowel program.  8/15- con't Foley due to enlarged scrotum- would be hard to cath- likely won't void-  - bowel program- with suppository/dig stim prn per wife's request 8/23- bowel program last night- good results- still has foley due to enlarged scrotum, but will d/w pt/family more about chronic foley? 8/25- scrotal swelling resolved; bowel program going well 10.  Elevated PSA/enlarged prostate.  Family currently not committed to oncology work-up.  Family has asked [place] to not discuss cancer possibility while here   12. Scrotal swelling- will elevate and Hospitalist is checking pro-BNP and giving a dose of Lasix.   8/25- resolved Will monitor/follow.  13. Dysphagia- on D3 thin diet- will con't SLP for swallowing therapy.  14. Resp issues-  Flutter valve- also con't ICS if desired.  15. Leukocytosis-UTI Completed macrobid 16.  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 Spasticity  8/24- pt doesn't want to increase spasticity meds as of yet.    I spent a total of 45 minutes on total care today- >50% on direct pt care speaking with pt about recovery possibilities, need for power w/c, and discussing foley vs cathing.    LOS: 14 days A FACE TO FACE EVALUATION WAS PERFORMED  Shahara Hartsfield 12/27/2020, 10:34 AM

## 2020-12-27 NOTE — Progress Notes (Signed)
Physical Therapy Session Note  Patient Details  Name: [name] MRN: [id number] Date of Birth: 11/02/1945  Today's Date: 12/27/2020 PT Individual Time: 0900-1005 PT Individual Time Calculation (min): 65 min   Short Term Goals: Week 2:  PT Short Term Goal 1 (Week 2): Patient will initiate power wheelchair mobility. PT Short Term Goal 2 (Week 2): Patient will direct slide board transfers 50% of tasks. PT Short Term Goal 3 (Week 2): Patient will tolerate EOB balance activites for 20 minutes. PT Short Term Goal 4 (Week 2): Patient will tolerate 10 minutes sitting balance activity prior to needing supported rest breaks.  Skilled Therapeutic Interventions/Progress Updates:    Patient in supine and reports no issues.  Requests to be checked if brief is clean.  Noted redness on sacrum and kept on side for >62min.  Patient supine dependent to don THT, pants, shoes.  Patient rolling to R max A and side to sit total A.  Seated to don abdominal binder with +2 A.  Patient transferred to power w/c with slide board and +2 total A.  Patient able to hold on while leaning over PT's back during transfer.  Patient in w/c adjusted for optimal comfort/breathing.  Performed w/c mobility with min A for R UE positioning x 150' to therapy gym.  In gym patient c/o feeling light headed with BP 104/63.  Patient kept tilted and performed LE stretching, then UE AAROM.  Patient tilted forward to work on coming up from leaning forward position after demonstration of use of head and arms.  Performed x 5 reps from w/c.  Assisted again to propel w/c to room using makeshift PVC joystick.  Patient positioned near bed and set up phone and sip n puff call button in reach and left with staff present.    Therapy Documentation Precautions:  Precautions Precautions: Back Precaution Comments: pathologic thoracic fracture- back precautions for safety Restrictions Weight Bearing Restrictions: No  Pain: Pain Assessment Pain Scale:  0-10 Pain Score: 0-No pain    Therapy/Group: Individual Therapy  Elray Mcgregor Sheran Lawless, PT 12/27/2020, 8:51 AM

## 2020-12-27 NOTE — Progress Notes (Signed)
Bowel program started. Dig stim performed. No stool returned. Blanchable redness noted on sacrum. Air mattress ordered, foam placed at bedside for placement after bowel program. Call light in reach, family at bedside.  Mylo Red, LPN

## 2020-12-27 NOTE — Progress Notes (Signed)
Occupational Therapy Weekly Progress Note  Patient Details  Name: [name] MRN: [id number] Date of Birth: Jun 25, 1945  Beginning of progress report period: December 20, 2020 End of progress report period: December 27, 2020   Patient has met 0 of 3 short term goals.  Pt progress continues to be slow but steady. Pt making slow and incremental progress with oral care using a u-cuff to hold toothbrush. Pt is able to maintain RUE in correct position and lightly brush his teeth with min A on the Rt side of his mouth. Pt requires max A for thoroughness and to complete task. Pt is able to partially thread his BUE into shirt sleeves and push towards elbows. Pt requires tot A to complete task. Bed mobility with tot A. Supine>sit EOB with tot A+2. Sitting balance EOB with tot A. SB tranfsers with tot A+2. Pt with increased elbow extension R>L and shoulder flexion R>L. Pt is independent with directing care. Family education commencing 8/29.  Patient continues to demonstrate the following deficits: muscle weakness and muscle paralysis, decreased cardiorespiratoy endurance, abnormal tone and decreased coordination, and decreased sitting balance, decreased standing balance, decreased balance strategies, and difficulty maintaining precautions and therefore will continue to benefit from skilled OT intervention to enhance overall performance with BADL and Reduce care partner burden.  Patient progressing toward long term goals..  Continue plan of care.  OT Short Term Goals Week 2:  OT Short Term Goal 1 (Week 2): Pt will assist in UB bathing with AE as needed to complete UB bathing with mod assist OT Short Term Goal 1 - Progress (Week 2): Progressing toward goal OT Short Term Goal 2 (Week 2): Pt will engage in rolling with max assist of one caregiver to decrease burden of care with toileting OT Short Term Goal 2 - Progress (Week 2): Progressing toward goal OT Short Term Goal 3 (Week 2): Pt will maintain static  sitting balance 2 mins with min assist in preparation for self-care tasks. OT Short Term Goal 3 - Progress (Week 2): Progressing toward goal Week 3:  OT Short Term Goal 1 (Week 3): Pt will assist in UB bathing with AE as needed to complete UB bathing with mod assist OT Short Term Goal 2 (Week 3): Pt will engage in rolling with max assist of one caregiver to decrease burden of care with toileting OT Short Term Goal 3 (Week 3): Pt will maintain static sitting balance 2 mins with min assist in preparation for self-care tasks.   [name] [facility] 12/27/2020, 6:43 AM

## 2020-12-27 NOTE — Progress Notes (Signed)
Speech Language Pathology Weekly Progress and Session Note  Patient Details  Name: [name] MRN: [id number] Date of Birth: 01-21-1946  Beginning of progress report period: December 21, 2020 End of progress report period: December 28, 2020  Today's Date: 12/28/2020 SLP Individual Time: 1420-1450 SLP Individual Time Calculation (min): 30 min and Today's Date: 12/28/2020 SLP Missed Time: 15 Minutes Missed Time Reason: Other (Comment) (scheduling conflict - patient overbooked)  Short Term Goals: Week 2: SLP Short Term Goal 1 (Week 2): Patient will complete 25 repetitions of IMT/EMT at 50% of MEP with 80% or better accuracy and a self perceived level of effort rating #7 or less on a 0-10 scale SLP Short Term Goal 1 - Progress (Week 2): Met SLP Short Term Goal 2 (Week 2): Patient will tolerate current diet without overt s/sx of aspiration and implement safe swallow precautions with mod I SLP Short Term Goal 2 - Progress (Week 2): Met SLP Short Term Goal 3 (Week 2): Patient will complete complex problem solving with mod I SLP Short Term Goal 3 - Progress (Week 2): Met  New Short Term Goals: Week 3: SLP Short Term Goal 1 (Week 3): Patient will verbalize understanding of uses of EMST device through teach back and demonstrate ability to self monitor perceived difficulty with mod I  Weekly Progress Updates: Patient has met 3 of 3 short term goals this reporting period. Patient has made excellent progress with ST over the past week due to improved breath support for speech, tolerance of expiratory muscle strength training, diet tolerance, and problem solving abilities. Patient is currently completing complex cognitive-linguistic tasks with mod I. He is tolerating diet advancement of regular diet and thin liquids without overt s/sx of aspiration. He demonstrates steady gains with EMST device, and ability to self monitor exertion levels via rate of perceived exertion scale. Recommend continued ST services  for continued education and modified independence with RMT. Discharge from [facility] services would be appropriate after education as patient has steadily progressed toward LTGs set at overall Mod I level.  Intensity: Minumum of 1-2 x/day, 30 to 90 minutes Frequency: 1 to 3 out of 7 days Duration/Length of Stay: 9/08 Treatment/Interventions: Dysphagia/aspiration precaution training;Patient/family education;Internal/external aids;Other (comment)  Daily Session Skilled Therapeutic Interventions: Patient agreeable to skilled ST intervention with focus on speech goals. Patient performed 25 repetitions (5 sets of 5) using EMST device. First two sets were completed at 15cm/H20 with perceived difficulty rating of 6/10. Increased intensity to 16cm/H20 during 3rd set in which patient rated difficulty as 6.5/10. Increased intensity to 17cm/H20 during 4th set in which patient rated difficulty as 7.5/10 thus set resistance between 16-17cm/H20 where patient reported difficulty as 7/10 during fifth and final set. Patient desired to keep device at this setting. Recommend continuation of EMST device and follow up education to support independence with use. Patient was left in wheelchair with alarm activated and immediate needs within reach at end of session. Continue per current plan of care.      General    Pain - no pain   Therapy/Group: Individual Therapy  Patty Sermons 12/28/2020, 5:02 PM

## 2020-12-27 NOTE — Progress Notes (Signed)
Occupational Therapy Session Note  Patient Details  Name: [name] MRN: [id number] Date of Birth: [birth year]  Today's Date: 12/27/2020 OT Individual Time: 2778-2423 OT Individual Time Calculation (min): 70 min    Short Term Goals: Week 3:  OT Short Term Goal 1 (Week 3): Pt will assist in UB bathing with AE as needed to complete UB bathing with mod assist OT Short Term Goal 2 (Week 3): Pt will engage in rolling with max assist of one caregiver to decrease burden of care with toileting OT Short Term Goal 3 (Week 3): Pt will maintain static sitting balance 2 mins with min assist in preparation for self-care tasks.  Skilled Therapeutic Interventions/Progress Updates:    Pt seated in PWC upon arrival. Pt maneuvered w/c out of room and into hallway up to nursing station. Pt transitioned to main gym. Minor adjustments made to headrest of PWC. Pt engaged in BUE AROM and AAROM activities with focus on elbow extension with slight shoulder flexion. Pt used small beach ball for elbow flexion/extension activities. Pt requires AAROM with LUE but able to complete all tasks with RUE unassisted. Pt with increased active elbow extension. Pt returned to room and remained seated in PWC. Sip-n-puff and iPhone positioned where pt can activate.  Therapy Documentation Precautions:  Precautions Precautions: Back Precaution Comments: pathologic thoracic fracture- back precautions for safety Restrictions Weight Bearing Restrictions: No  Pain: Pain Assessment Pain Scale: 0-10 Pain Score: 0-No pain   Therapy/Group: Individual Therapy  Rich Brave 12/27/2020, 12:13 PM

## 2020-12-28 NOTE — Progress Notes (Signed)
Occupational Therapy Session Note  Patient Details  Name: [name] MRN: [id number] Date of Birth: 12-02-1945   Today's Date: 12/28/2020 OT Individual Time: 1100-1200 OT Individual Time Calculation (min): 60 min   Short Term Goals: Week 1:  OT Short Term Goal 1 (Week 1): Pt will maintain static sitting balance 2 mins with min assist in preparation for self-care tasks. OT Short Term Goal 1 - Progress (Week 1): Progressing toward goal OT Short Term Goal 2 (Week 1): Pt will assist in UB bathing with AE as needed to complete UB bathing with mod assist OT Short Term Goal 2 - Progress (Week 1): Progressing toward goal OT Short Term Goal 3 (Week 1): Pt will engage in rolling with max assist of one caregiver to decrease burden of care with toileting OT Short Term Goal 3 - Progress (Week 1): Progressing toward goal  Skilled Therapeutic Interventions/Progress Updates:    1:1. Pt received in PWC. Pt completes oral care with MAX HOH A and use of U-cuff after toothpate application and A to turn toothbrush to get all of feet. OT built long straw with bend in center by taping 3 pieces together. OT interviews pt on technology use and leisure activities. Pt educated on variety of assistive technology options: Puck universal remote, track ball mouse, stylus/ucuff v red foam handle and voice control/show numbers feature. Pt most interested on use of stylus for iPad and wanting to work on that in another session. Pt does not want to use siri on iPad as pt is worried about privacy. Exited session with pt seated in bed, exit alarm on and call light in reach   Therapy Documentation Precautions:  Precautions Precautions: Back Precaution Comments: pathologic thoracic fracture- back precautions for safety Restrictions Weight Bearing Restrictions: No General:   Vital Signs:  Pain: Pain Assessment Pain Scale: 0-10 Pain Score: 0-No pain ADL: ADL Upper Body Bathing: Dependent (+2) Where Assessed-Upper  Body Bathing: Edge of bed Lower Body Bathing: Dependent (+2) Where Assessed-Lower Body Bathing: Bed level Upper Body Dressing: Dependent (+2) Where Assessed-Upper Body Dressing: Edge of bed Lower Body Dressing: Dependent (+2) Where Assessed-Lower Body Dressing: Bed level Toileting: Dependent (+2) Where Assessed-Toileting: Bed level Toilet Transfer: Not assessed Tub/Shower Transfer: Not assessed Vision   Perception    Praxis   Exercises:   Other Treatments:     Therapy/Group: Individual Therapy  Shon Hale 12/28/2020, 12:14 PM

## 2020-12-28 NOTE — Evaluation (Signed)
Recreational Therapy Assessment and Plan  Patient Details  Name: [name] MRN: [id number] Date of Birth: 08-04-1945 Today's Date: 12/28/2020  Rehab Potential:  Good ELOS:   d/c 9/8  Assessment  Hospital Problem: Principal Problem:   Quadriplegia [facility]) Active Problems:   Neurogenic bowel   Neurogenic bladder   Scrotal swelling   Foley catheter in place on admission   Dysphagia     Past Medical History: History reviewed. No pertinent past medical history. Past Surgical History:       Past Surgical History:  Procedure Laterality Date   LOOP RECORDER INSERTION N/A 12/13/2020    Procedure: LOOP RECORDER INSERTION;  Surgeon: [name], MD;  Location: [facility];  Service: Cardiovascular;  Laterality: N/A;   TONSILLECTOMY          Assessment & Plan Clinical Impression: .[name] is a 75 year old right-handed male unremarkable past medical history except he did have a history of spinal stenosis C7-T1 not requiring surgical intervention and on no prescription medications.  Per chart review patient lives with spouse.  Independent prior to admission.  Presented 12/07/2020 with onset of progressive diffuse weakness pain across his neck/shoulders and inability to walk.  Urine drug screen was negative.  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  Neurology consulted Dr. 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  Dr.Xu felt that stroke to brain/spine likely cardioembolic in nature and recommended DAPT as well as loop recorder to rule out atrial fibrillation .  Carotid Dopplers negative for ICA stenosis.  Bilateral extremity Dopplers negative.  Echocardiogram with ejection fraction of 60 to 65% no wall motion abnormalities.  Dr. Alvy Bimler of hematology service consulted for input on pancytopenia question metastatic prostate cancer due to work-up revealing enlarged prostate and elevated PSA.  Bone scan with bone biopsy recommended for work-up of pancytopenia.  Family not interested in aggressive cancer work-up if patient did not have the ability to gain prior level of independence.  He was cleared to begin Lovenox for DVT prophylaxis.  He was placed on aspirin Plavix for CVA prophylaxis x3 weeks total then aspirin alone.  Therapy evaluations completed due to patient's quadriparesis was admitted for a comprehensive rehab program  Patient transferred to CIR on 12/13/2020 .   Met with pt today to discuss TR services, overall health including social, emotional, spiritual health in addition to physical, activity analysis/modifications & stress management.  Pt presents with decreased activity tolerance, decreased functional mobility, decreased balance,  decreased coordination Limiting pt's independence with leisure/community pursuits.  Plan  Min 1 TR session >20 minutes during LOS  Recommendations for other services: None   Discharge Criteria: Patient will be discharged from TR if patient refuses treatment 3 consecutive times without medical reason.  If treatment goals not met, if there is a change in medical status, if patient makes no progress towards goals or if patient is discharged from hospital.  The above assessment, treatment plan, treatment alternatives and goals were  discussed and mutually agreed upon: by  patient  [place] 12/28/2020, 3:24 PM

## 2020-12-28 NOTE — Progress Notes (Signed)
Continued bowel program from previous shift. Performed Dig Stim. Pt. had a large bowel movement. Pt.Tolerated well. Foam dressing placed on sacrum to protect blanchable redness.

## 2020-12-28 NOTE — Progress Notes (Signed)
Physical Therapy Session Note  Patient Details  Name: [name] MRN: [id number] Date of Birth: 02/08/1946  Today's Date: 12/28/2020 PT Individual Time: 2992-4268 PT Individual Time Calculation (min): 31 min   Short Term Goals: Week 2:  PT Short Term Goal 1 (Week 2): Patient will initiate power wheelchair mobility. PT Short Term Goal 2 (Week 2): Patient will direct slide board transfers 50% of tasks. PT Short Term Goal 3 (Week 2): Patient will tolerate EOB balance activites for 20 minutes. PT Short Term Goal 4 (Week 2): Patient will tolerate 10 minutes sitting balance activity prior to needing supported rest breaks.  Skilled Therapeutic Interventions/Progress Updates: Pt presents reclined in TIS PWC and agreeable to participate.  PT turned power on.  Pt required hand over hand for placement on joystick for center grip.  Pt able to bring seat to neutral and then requested pillow under R UE.  Required re-positioning of seat and pillow to achieve optimal position for improved conrol.  Pt negotiated out of room and into hallway w/ CGA to supervision.  Pt encouraged for increased speed in open hallway, while maintaining position to right of hallway using different colored tiles for visual input.  Pt able to negotiate along both sides of hallway, perform u-turn and then in and out of gym and into room.  Pt required A of PT to sposition w/c alongside bed.  Pt resumed reclined position in w/c w/ all needs in reach as demonstrated.     Therapy Documentation Precautions:  Precautions Precautions: Back Precaution Comments: pathologic thoracic fracture- back precautions for safety Restrictions Weight Bearing Restrictions: No General:   Vital Signs: Therapy Vitals Temp: 97.6 F (36.4 C) Temp Source: Oral Pulse Rate: (!) 54 Resp: 18 BP: 99/62 Patient Position (if appropriate): Sitting Oxygen Therapy SpO2: 98 % O2 Device: Room Air Pain: pt states some pain in neck, but not quantifying.        Therapy/Group: Individual Therapy  Lucio Edward 12/28/2020, 3:41 PM

## 2020-12-28 NOTE — Progress Notes (Signed)
Physical Therapy Session Note  Patient Details  Name: [name] MRN: [id number] Date of Birth: [birth year]  Today's Date: 12/28/2020 PT Individual Time: 0930-1057 PT Individual Time Calculation (min): 87 min   Short Term Goals: Week 2:  PT Short Term Goal 1 (Week 2): Patient will initiate power wheelchair mobility. PT Short Term Goal 2 (Week 2): Patient will direct slide board transfers 50% of tasks. PT Short Term Goal 3 (Week 2): Patient will tolerate EOB balance activites for 20 minutes. PT Short Term Goal 4 (Week 2): Patient will tolerate 10 minutes sitting balance activity prior to needing supported rest breaks.   Skilled Therapeutic Interventions/Progress Updates:     Pt supine in bed to start session - agreeable to therapy. Reports mild L shoulder pain, rest breaks and repositioning provided for pain management. Donned thigh-high TED's, pants, abdominal binder, and shoes at bed level with dependant care. Catheter bag required draining and - RN notified. Bed lowered and assisted to short sitting EOB with +2 at dependant level. Requires +2 totalA for sitting balance at EOB. Sliding board transfer with +2 totalA from EOB to TIS w/c - pt leaning forward on PT's shoulder to facilitate forward weight shift. Pt required +2 totalA for repositioning in w/c. He c/o mild lightheadedness, reclined back and BP assessed reading 87/53. Tightened abdominal binder and reading 99/59. Pt required assist for driving his PWC in room for backing up and navigating barriers. In open hallway, he was able to drive his PWC with supervision. Assisted to mat table via sliding board with +2 totalA. Worked on static sitting balance for a few minutes and then he began to report increased wooziness, requesting to lay down. Assisted to supine (large wedge under shoulders for support) and BP assessed, reading 133/72. Reports improvement in symptoms. Assisted back to sitting with +2 totalA and assisted back to his PWC  via sliding board. He drove back to his room with supervision and assisted in room to park next to his bed. Provided setupA for phone and sip-n-puff call bell via modular adaptive device. Reclined for comfort in PWC. All needs within reach.   Therapy Documentation Precautions:  Precautions Precautions: Back Precaution Comments: pathologic thoracic fracture- back precautions for safety Restrictions Weight Bearing Restrictions: No General:    Therapy/Group: Individual Therapy  Orrin Brigham 12/28/2020, 7:54 AM

## 2020-12-28 NOTE — Progress Notes (Signed)
PROGRESS NOTE   Subjective/Complaints:  Pt reports slept all night- was turned intermittently through night- has some blanchable redness on sacrum, so foam dressing placed last night- on L side currently. Also was uncomfortable, but better with dressing.   Also shoulder pain has subsided a lot.  Had orthostatic hypotension yesterday- admits hasn't been drinking enough- has to call to get water. Spoke with  Romeo Apple- head of therapy- to see if can get pt a long straw and weighted cup to see drink on his own.     ROS:   Pt denies SOB, abd pain, CP, N/V/C/D, and vision changes       Objective:   No results found. No results for input(s): WBC, HGB, HCT, PLT in the last 72 hours.  No results for input(s): NA, K, CL, CO2, GLUCOSE, BUN, CREATININE, CALCIUM in the last 72 hours.   Intake/Output Summary (Last 24 hours) at 12/28/2020 1344 Last data filed at 12/28/2020 0805 Gross per 24 hour  Intake 440 ml  Output 1000 ml  Net -560 ml        Physical Exam: Vital Signs Blood pressure 99/62, pulse (!) 54, temperature 97.6 F (36.4 C), temperature source Oral, resp. rate 18, height 5' 8.5" (1.74 m), weight 73.2 kg, SpO2 98 %.      General: awake, alert, appropriate, laying supine but slightly on L side in bed;  NAD HENT: conjugate gaze; oropharynx moist- sip n puff call bell close by CV: bradycardic; ; no JVD Pulmonary: CTA B/L; no W/R/R- good air movement GI: soft, NT, ND, (+)BS Psychiatric: appropriate; depressed but sweet Neurological: Ox3 Has a little tenting of skin GU: scrotal swelling resolved- foley in place Motor RUE: biceps 4/5, WE 3-/5, triceps 2-/5, grip 2-/5, finger abd 0/5, unchanged LUE: biceps 4/5, WE 3-/5, triceps 0/5, grip 1/5, and finger abd 0/5,stable LE's 0/5 in HF, KE, DF, APF. Senses gross touch in both legs.  No change in Legs  Assessment/Plan: 1. Functional deficits which require 3+ hours per  day of interdisciplinary therapy in a comprehensive inpatient rehab setting. Physiatrist is providing close team supervision and 24 hour management of active medical problems listed below. Physiatrist and rehab team continue to assess barriers to discharge/monitor patient progress toward functional and medical goals  Care Tool:  Bathing        Body parts bathed by helper: Right arm, Left arm, Chest, Abdomen, Front perineal area, Buttocks, Right upper leg, Left upper leg, Right lower leg, Left lower leg, Face     Bathing assist Assist Level: 2 Helpers     Upper Body Dressing/Undressing Upper body dressing   What is the patient wearing?: Pull over shirt    Upper body assist Assist Level: Total Assistance - Patient < 25%    Lower Body Dressing/Undressing Lower body dressing      What is the patient wearing?: Pants     Lower body assist Assist for lower body dressing: 2 Helpers     Toileting Toileting    Toileting assist Assist for toileting: 2 Helpers     Transfers Chair/bed transfer  Transfers assist  Chair/bed transfer activity did not occur: Safety/medical concerns  Chair/bed transfer  assist level: 2 Helpers     Locomotion Ambulation   Ambulation assist   Ambulation activity did not occur: Safety/medical concerns          Walk 10 feet activity   Assist  Walk 10 feet activity did not occur: Safety/medical concerns        Walk 50 feet activity   Assist Walk 50 feet with 2 turns activity did not occur: Safety/medical concerns         Walk 150 feet activity   Assist Walk 150 feet activity did not occur: Safety/medical concerns         Walk 10 feet on uneven surface  activity   Assist Walk 10 feet on uneven surfaces activity did not occur: Safety/medical concerns         Wheelchair     Assist Is the patient using a wheelchair?: Yes Type of Wheelchair: Power Wheelchair activity did not occur: Safety/medical concerns  (limited tolerance for upright)  Wheelchair assist level: Minimal Assistance - Patient > 75% Max wheelchair distance: 150    Wheelchair 50 feet with 2 turns activity    Assist    Wheelchair 50 feet with 2 turns activity did not occur: Safety/medical concerns   Assist Level: Minimal Assistance - Patient > 75%   Wheelchair 150 feet activity     Assist  Wheelchair 150 feet activity did not occur: Safety/medical concerns   Assist Level: Minimal Assistance - Patient > 75%   Blood pressure 99/62, pulse (!) 54, temperature 97.6 F (36.4 C), temperature source Oral, resp. rate 18, height 5' 8.5" (1.74 m), weight 73.2 kg, SpO2 98 %.  Medical Problem List and Plan: 1.  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 Pt also with lesion involving T1 vertebral body and right side posterior elements with associated pathologic vertebral fracture concerning for metastatic disease (?prostate) or other neoplasm. 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  Status post plan loop recorder  -turn q2-3 hours to reduce risk of pressure ulcers.  --con't PT and OT/CIR- and educated on autonomic dysreflexia/AD- today- that nasal congestion is sign of AD esp isolated or with Headache, anxiety, sense of impending doom.  Con't PT and OT- will get family conference next Thursday 2.  Antithrombotics: -DVT/anticoagulation: Venous Doppler studies negative.  Continue Lovenox Pharmaceutical: Lovenox 8/25- needs for a minimum of 3 months from injury             -antiplatelet therapy: Aspirin 81 mg daily and Plavix 75 mg day x3 weeks and aspirin alone 3. Pain Management: Tylenol   - Lidoderm patches 8pm to 8am -3 of them -kpad for shoulder pain, encouraged use of tylenol 8/23- pain controlled with lidocaine patches and tylenol prn  8/26- pain in shoulders much better- con't regimen 4. Mood/depression/insomnia: Provide  emotional support  Celexa 10 mg daily started on 8/12  -melatonin for sleep  -neuropsych intervention would be helpful for him             -antipsychotic agents: N/A 5. Neuropsych: This patient is capable of making decisions on his own behalf. 6. Skin/Wound Care: Routine skin checks 7. Fluids/Electrolytes/Nutrition: Routine in and outs 8.  Thrombocytopenia.  Resolved 9.  Neurogenic bowel bladder.  Foley catheter tube has been placed for multiple in and out catheterization Lidocaine jelly for bowel program.  8/15- con't Foley due to enlarged scrotum- would be hard to cath- likely won't void-  - bowel program- with suppository/dig stim  prn per wife's request 8/23- bowel program last night- good results- still has foley due to enlarged scrotum, but will d/w pt/family more about chronic foley? 8/25- scrotal swelling resolved; bowel program going well 8/26- will d/w family the bowel program and foley- chronic vs cathing at family conference 10.  Elevated PSA/enlarged prostate.  Family currently not committed to oncology work-up.  Family has asked [place] to not discuss cancer possibility while here   12. Scrotal swelling- will elevate and Hospitalist is checking pro-BNP and giving a dose of Lasix.   8/25- resolved Will monitor/follow.  13. Dysphagia- on D3 thin diet- will con't SLP for swallowing therapy.  14. Resp issues-  Flutter valve- also con't ICS if desired.  15. Leukocytosis-UTI Completed macrobid 16.  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 Spasticity  8/24- pt doesn't want to increase spasticity meds as of yet.  19. Orthostatic hypotension  8/26- asked pt to push fluids- also spoke to head of therapy to get weighted cup and long  straw so pt can drink without calling staff. Will also insist on TEDs- wait on abd binder? 20. Blanchable skin on sacrum  8/26- needs to be turned q2 hours when in bed- and foam dressing placed.     I spent a total of 36 minutes on total care- >50% on coordination of care- d/w staff about turning pt as well as with head of PT about straw/cup.    LOS: 15 days A FACE TO FACE EVALUATION WAS PERFORMED  [name] 12/28/2020, 1:44 PM

## 2020-12-29 MED ORDER — CITALOPRAM HYDROBROMIDE 10 MG PO TABS
20.0000 mg | ORAL_TABLET | Freq: Every day | ORAL | Status: DC
  Administered 2020-12-30 – 2021-01-10 (×12): 20 mg via ORAL
  Filled 2020-12-29 (×12): qty 2

## 2020-12-29 NOTE — Progress Notes (Signed)
PROGRESS NOTE   Subjective/Complaints:  Pt got weighted cup and long straw, but can only use in W/C.  Will ask Tuesday if can get a very long straw to attach to sip n- puff Pt admits I woke him up this AM Laying supine, but was turned during the night.  Had a stuffy nose last 2-3 days- nasal saline helps a lot- doesn't want Flonase.    ROS:   Pt denies SOB, abd pain, CP, N/V/C/D, and vision changes      Objective:   No results found. No results for input(s): WBC, HGB, HCT, PLT in the last 72 hours.  No results for input(s): NA, K, CL, CO2, GLUCOSE, BUN, CREATININE, CALCIUM in the last 72 hours.   Intake/Output Summary (Last 24 hours) at 12/29/2020 1029 Last data filed at 12/29/2020 0910 Gross per 24 hour  Intake 480 ml  Output 1300 ml  Net -820 ml        Physical Exam: Vital Signs Blood pressure (!) 148/73, pulse 68, temperature 99 F (37.2 C), resp. rate 18, height 5' 8.5" (1.74 m), weight 73.2 kg, SpO2 98 %.       General: awake, alert, appropriate, gave pt water; cannot reach; asleep, but woke easily; laying in bed; sat him up! NAD HENT: conjugate gaze; oropharynx moist; sip n puff within reach of pt. No nasal congestion this AM CV: regular rate; no JVD Pulmonary: CTA B/L; no W/R/R- good air movement GI: soft, NT, ND, (+)BS Psychiatric: appropriate; quiet; depressed?? Neurological: Ox3 GU: scrotal swelling resolved- foley in place Motor- lacking ~ 5 degrees of L elbow extension RUE: biceps 4/5, WE 3-/5, triceps 2-/5, grip 2-/5, finger abd 0/5, unchanged LUE: biceps 4/5, WE 3-/5, triceps 0/5, grip 1/5, and finger abd 0/5,stable LE's 0/5 in HF, KE, DF, APF. Senses gross touch in both legs.  No change in Legs  Assessment/Plan: 1. Functional deficits which require 3+ hours per day of interdisciplinary therapy in a comprehensive inpatient rehab setting. Physiatrist is providing close team supervision  and 24 hour management of active medical problems listed below. Physiatrist and rehab team continue to assess barriers to discharge/monitor patient progress toward functional and medical goals  Care Tool:  Bathing        Body parts bathed by helper: Right arm, Left arm, Chest, Abdomen, Front perineal area, Buttocks, Right upper leg, Left upper leg, Right lower leg, Left lower leg, Face     Bathing assist Assist Level: 2 Helpers     Upper Body Dressing/Undressing Upper body dressing   What is the patient wearing?: Pull over shirt    Upper body assist Assist Level: Total Assistance - Patient < 25%    Lower Body Dressing/Undressing Lower body dressing      What is the patient wearing?: Pants     Lower body assist Assist for lower body dressing: 2 Helpers     Toileting Toileting    Toileting assist Assist for toileting: 2 Helpers     Transfers Chair/bed transfer  Transfers assist  Chair/bed transfer activity did not occur: Safety/medical concerns  Chair/bed transfer assist level: 2 Helpers     Locomotion Ambulation   Ambulation assist  Ambulation activity did not occur: Safety/medical concerns          Walk 10 feet activity   Assist  Walk 10 feet activity did not occur: Safety/medical concerns        Walk 50 feet activity   Assist Walk 50 feet with 2 turns activity did not occur: Safety/medical concerns         Walk 150 feet activity   Assist Walk 150 feet activity did not occur: Safety/medical concerns         Walk 10 feet on uneven surface  activity   Assist Walk 10 feet on uneven surfaces activity did not occur: Safety/medical concerns         Wheelchair     Assist Is the patient using a wheelchair?: Yes Type of Wheelchair: Power Wheelchair activity did not occur: Safety/medical concerns (limited tolerance for upright)  Wheelchair assist level: Contact Guard/Touching assist Max wheelchair distance: 200     Wheelchair 50 feet with 2 turns activity    Assist    Wheelchair 50 feet with 2 turns activity did not occur: Safety/medical concerns   Assist Level: Contact Guard/Touching assist   Wheelchair 150 feet activity     Assist  Wheelchair 150 feet activity did not occur: Safety/medical concerns   Assist Level: Contact Guard/Touching assist   Blood pressure (!) 148/73, pulse 68, temperature 99 F (37.2 C), resp. rate 18, height 5' 8.5" (1.74 m), weight 73.2 kg, SpO2 98 %.  Medical Problem List and Plan: 1.  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 Pt also with lesion involving T1 vertebral body and right side posterior elements with associated pathologic vertebral fracture concerning for metastatic disease (?prostate) or other neoplasm. 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  Status post plan loop recorder  -turn q2-3 hours to reduce risk of pressure ulcers.  --con't PT and OT/CIR- and educated on autonomic dysreflexia/AD- today- that nasal congestion is sign of AD esp isolated or with Headache, anxiety, sense of impending doom.  Con't PT and OT_ family conference next Thursday 2.  Antithrombotics: -DVT/anticoagulation: Venous Doppler studies negative.  Continue Lovenox Pharmaceutical: Lovenox 8/25- needs for a minimum of 3 months from injury             -antiplatelet therapy: Aspirin 81 mg daily and Plavix 75 mg day x3 weeks and aspirin alone 3. Pain Management: Tylenol   - Lidoderm patches 8pm to 8am -3 of them -kpad for shoulder pain, encouraged use of tylenol 8/23- pain controlled with lidocaine patches and tylenol prn  8/26- pain in shoulders much better- con't regimen 4. Mood/depression/insomnia: Provide emotional support  Celexa 10 mg daily started on 8/12  8/27- will increase Celexa to 20 mg daily-   -melatonin for sleep  -neuropsych intervention would be helpful for him              -antipsychotic agents: N/A 5. Neuropsych: This patient is capable of making decisions on his own behalf. 6. Skin/Wound Care: Routine skin checks 7. Fluids/Electrolytes/Nutrition: Routine in and outs 8.  Thrombocytopenia.  Resolved 9.  Neurogenic bowel bladder.  Foley catheter tube has been placed for multiple in and out catheterization Lidocaine jelly for bowel program.  8/15- con't Foley due to enlarged scrotum- would be hard to cath- likely won't void-  - bowel program- with suppository/dig stim prn per wife's request 8/23- bowel program last night- good results- still has foley due to enlarged scrotum, but will  d/w pt/family more about chronic foley? 8/25- scrotal swelling resolved; bowel program going well 8/26- will d/w family the bowel program and foley- chronic vs cathing at family conference 10.  Elevated PSA/enlarged prostate.  Family currently not committed to oncology work-up.  Family has asked [place] to not discuss cancer possibility while here   12. Scrotal swelling- will elevate and Hospitalist is checking pro-BNP and giving a dose of Lasix.   8/25- resolved Will monitor/follow.  13. Dysphagia- on D3 thin diet- will con't SLP for swallowing therapy.  14. Resp issues-  Flutter valve- also con't ICS if desired.  15. Leukocytosis-UTI Completed macrobid 16.  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 Spasticity  8/24- pt doesn't want to increase spasticity meds as of yet.  19. Orthostatic hypotension  8/26- asked pt to push fluids- also spoke to head of therapy to get weighted cup and long straw so pt can drink without  calling staff. Will also insist on TEDs- wait on abd binder? 20. Blanchable skin on sacrum  8/26- needs to be turned q2 hours when in bed- and foam dressing placed.  21. Nasal congestion  8/27- not clear if underlying or due to AD- but pt wants ot wait on Flonase- will con't nasal sline.    LOS: 16 days A FACE TO FACE EVALUATION WAS PERFORMED  Jinger Middlesworth 12/29/2020, 10:29 AM

## 2020-12-29 NOTE — Progress Notes (Signed)
Occupational Therapy Session Note  Patient Details  Name: Sutton Hirsch MRN: [id number] Date of Birth: 05-14-1945  Today's Date: 12/30/2020 OT Group Time: 1100-1200 OT Group Time Calculation (min): 60 min  Skilled Therapeutic Interventions/Progress Updates:    Pt engaged in therapeutic w/c level dance group focusing on patient choice, UE/LE strengthening, salience, activity tolerance, and social participation. Pt was guided through various dance-based exercises involving UEs/LEs and trunk. All music was selected by group members. Emphasis placed on NMR and activity tolerance. Pt participated while reclined in his PWC and wearing his abdominal binder. He required multiple rest breaks due to fatigue, needed assistance for elbow extension bilaterally Lt>Rt as well as for opening palms. Pt visibly enjoyed the group environment and was conversing with others. At end of session NT assisted pt via PWC back to the room.    Therapy Documentation Precautions:  Precautions Precautions: Back Precaution Comments: pathologic thoracic fracture- back precautions for safety Restrictions Weight Bearing Restrictions: No  Pain: no s/s pain during tx   ADL: ADL Upper Body Bathing: Dependent (+2) Where Assessed-Upper Body Bathing: Edge of bed Lower Body Bathing: Dependent (+2) Where Assessed-Lower Body Bathing: Bed level Upper Body Dressing: Dependent (+2) Where Assessed-Upper Body Dressing: Edge of bed Lower Body Dressing: Dependent (+2) Where Assessed-Lower Body Dressing: Bed level Toileting: Dependent (+2) Where Assessed-Toileting: Bed level Toilet Transfer: Not assessed Tub/Shower Transfer: Not assessed     Therapy/Group: Group Therapy  Alisa Stjames A Lev Cervone 12/30/2020, 12:37 PM

## 2020-12-29 NOTE — Progress Notes (Signed)
Bowel program started. Suppository given. Dig Stim performed. Patient had small loose bowel movement.

## 2020-12-29 NOTE — Progress Notes (Signed)
Continued bowel program from previous shift. Performed Dig Stim. Pt. had a large bowel movement. Pt.Tolerated well.

## 2020-12-29 NOTE — Progress Notes (Signed)
Physical Therapy Session Note  Patient Details  Name: [name] MRN: [id number] Date of Birth: 1946-04-09  Today's Date: 12/29/2020 PT Individual Time: 1620-1730 PT Individual Time Calculation (min): 70 min   Short Term Goals:  Week 2:  PT Short Term Goal 1 (Week 2): Patient will initiate power wheelchair mobility. PT Short Term Goal 2 (Week 2): Patient will direct slide board transfers 50% of tasks. PT Short Term Goal 3 (Week 2): Patient will tolerate EOB balance activites for 20 minutes. PT Short Term Goal 4 (Week 2): Patient will tolerate 10 minutes sitting balance activity prior to needing supported rest breaks.   Skilled Therapeutic Interventions/Progress Updates:   Pt received sitting in WC and agreeable to PT. Pt instructed pt WC mobility x 249f with min assist to stabilize the RUE. Pt also performed WC mobility over cement sidewalk x [id number] supervision assist and RUE supported on pillow. PT performed Heel cord stretch 2 x 2 min . HS stretch 3 x 2 min each piriformis and glute med stret into partial fiure 4 3 x 2 min hold. Pt returned to room and performed maxi move transfer to bed +2 from wife for safety. Rolling R and L L to remove sling and abdominal binder x 2 each with max assist. Pt left supine in bed with call bell in reach and all needs met.       Therapy Documentation Precautions:  Precautions Precautions: Back Precaution Comments: pathologic thoracic fracture- back precautions for safety Restrictions Weight Bearing Restrictions: No   Pain:  denies   Therapy/Group: Individual Therapy  AuLorie Phenix/27/2022, 6:06 PM

## 2020-12-29 NOTE — Progress Notes (Signed)
Speech Language Pathology Daily Session Note  Patient Details  Name: [name] MRN: [id number] Date of Birth: [birth year]  Today's Date: 12/29/2020 SLP Individual Time: 0850-0930 SLP Individual Time Calculation (min): 40 min  Short Term Goals: Week 3: SLP Short Term Goal 1 (Week 3): Patient will verbalize understanding of uses of EMST device through teach back and demonstrate ability to self monitor perceived difficulty with mod I  Skilled Therapeutic Interventions:  Patient seen for skilled ST session focusing on speech/voice therapy. SLP reviewed how to adjust EMST device and patient asked appropriate questions. He is capable of informing wife of how to make adjustments as needed. SLP recommended that patient attempt increasing the resistance every couple days but if he judges difficulty to be higher than a 7/10, to return to previous setting. SLP also discussed and reviewed breathing exercises (breath hold, sustained breath,etc) and introduced putting light pressure on abdomen during controlled breathing exercises. SLP informed patient that next session (8/29) will likely be last as he will be able to start completing exercises/strategies mod I. Patient continues to benefit from skilled SLP intervention to maximize speech/voice function prior to discharge.  Pain Pain Assessment Pain Scale: 0-10 Pain Score: 0-No pain  Therapy/Group: Individual Therapy  Angela Nevin, MA, CCC-SLP Speech Therapy

## 2020-12-30 MED ORDER — ENOXAPARIN (LOVENOX) PATIENT EDUCATION KIT
PACK | Freq: Once | Status: AC
  Filled 2020-12-30: qty 1

## 2020-12-30 NOTE — Progress Notes (Signed)
Speech Language Pathology Daily Session Note  Patient Details  Name: [name] MRN: [id number] Date of Birth: [birth year]  Today's Date: 12/30/2020 SLP Individual Time: 0815-0900 SLP Individual Time Calculation (min): 45 min  Short Term Goals: Week 3: SLP Short Term Goal 1 (Week 3): Patient will verbalize understanding of uses of EMST device through teach back and demonstrate ability to self monitor perceived difficulty with mod I  Skilled Therapeutic Interventions:   Pt seen at bedside with wife in attendance. Therapy was centered around EMST device. Pt was able to recall exercise sets/reps for EMST (first verbalizing "4" vs. "5", but then self corrected indep) and demonstrated use to SLP. SLP and pt completed 5 sets of 5 reps with ~30 sec rest. Required assistance for holding device due to limited upper extremity movement. Continue with current POC.   Pain Pain Assessment Pain Scale: 0-10 Pain Score: 0-No pain Faces Pain Scale: No hurt  Therapy/Group: Individual Therapy  Dorena Bodo MS, CCC-SLP, CBIS  12/30/2020, 1:52 PM

## 2020-12-30 NOTE — Progress Notes (Signed)
Physical Therapy Session Note  Patient Details  Name: [name] MRN: [id number] Date of Birth: 07-16-1945  Today's Date: 12/30/2020 PT Individual Time: 1345-1430 PT Individual Time Calculation (min): 45 min   Short Term Goals: Week 2:  PT Short Term Goal 1 (Week 2): Patient will initiate power wheelchair mobility. PT Short Term Goal 2 (Week 2): Patient will direct slide board transfers 50% of tasks. PT Short Term Goal 3 (Week 2): Patient will tolerate EOB balance activites for 20 minutes. PT Short Term Goal 4 (Week 2): Patient will tolerate 10 minutes sitting balance activity prior to needing supported rest breaks.  Skilled Therapeutic Interventions/Progress Updates:    Pt received seated in PWC in room, agreeable to PT session. Pt reports feeling fatigued from sitting up in PWC throughout the day. Slide board transfer PWC to bed with total A x 2. Sit to supine total A x 2 for trunk control and BLE management. Reviewed PWC controls and pt reports some difficulty with steering due to chair veering to the L. Discussed use of PWC vs TIS in the home and increased independence with mobility, pressure relief, and repositioning that PWC allows vs being dependent with use of TIS. Pt agreeable to wheelchair evaluation tomorrow, concerned with cost of chair and if company can provide chair if he moves to [place] to be closer to family for more support. Will discuss with w/c rep during next session. Pt also with questions regarding family education session. Discussed that we will review bed mobility, transfers, and PWC controls during session and that therapy recommending two people be present to assist with all mobility for safety. Pt understanding of education. Pt left seated in bed with needs in reach, bed alarm in place.  Therapy Documentation Precautions:  Precautions Precautions: Back Precaution Comments: pathologic thoracic fracture- back precautions for safety Restrictions Weight Bearing  Restrictions: No    Therapy/Group: Individual Therapy   Peter Congo, PT, DPT, CSRS  12/30/2020, 5:16 PM

## 2020-12-30 NOTE — Progress Notes (Signed)
Bowel program started. Suppository given. Dig stim performed. No bowel movement.

## 2020-12-30 NOTE — Progress Notes (Signed)
PROGRESS NOTE   Subjective/Complaints:  Pt's wife spent the night last night- here this AM.  Slept great since she was here.  Discussed with pt and wife- they want to go with chronic foley, not in/out caths.   Using long straw when up in w/c, but cannot reach when in bed.    ROS:    Pt denies SOB, abd pain, CP, N/V/C/D, and vision changes      Objective:   No results found. No results for input(s): WBC, HGB, HCT, PLT in the last 72 hours.  No results for input(s): NA, K, CL, CO2, GLUCOSE, BUN, CREATININE, CALCIUM in the last 72 hours.   Intake/Output Summary (Last 24 hours) at 12/30/2020 1409 Last data filed at 12/30/2020 0730 Gross per 24 hour  Intake 238 ml  Output 2500 ml  Net -2262 ml        Physical Exam: Vital Signs Blood pressure 140/76, pulse 61, temperature 97.6 F (36.4 C), resp. rate 18, height 5' 8.5" (1.74 m), weight 73.2 kg, SpO2 97 %.        General: awake, alert, appropriate, laying supine in bed; wife at bedside; NAD HENT: conjugate gaze; oropharynx moist CV: regular rate; no JVD Pulmonary: CTA B/L; no W/R/R- good air movement GI: soft, NT, ND, (+)BS Psychiatric: appropriate; quiet Neurological: Ox3 MAS of 1+ GU: scrotal swelling resolved- foley in place Motor- lacking ~ 5 degrees of L elbow extension RUE: biceps 4/5, WE 3-/5, triceps 2-/5, grip 2-/5, finger abd 0/5, unchanged LUE: biceps 4/5, WE 3-/5, triceps 0/5, grip 1/5, and finger abd 0/5,stable LE's 0/5 in HF, KE, DF, APF. Senses gross touch in both legs.  No change in Legs  Assessment/Plan: 1. Functional deficits which require 3+ hours per day of interdisciplinary therapy in a comprehensive inpatient rehab setting. Physiatrist is providing close team supervision and 24 hour management of active medical problems listed below. Physiatrist and rehab team continue to assess barriers to discharge/monitor patient progress toward  functional and medical goals  Care Tool:  Bathing        Body parts bathed by helper: Right arm, Left arm, Chest, Abdomen, Front perineal area, Buttocks, Right upper leg, Left upper leg, Right lower leg, Left lower leg, Face     Bathing assist Assist Level: 2 Helpers     Upper Body Dressing/Undressing Upper body dressing   What is the patient wearing?: Pull over shirt    Upper body assist Assist Level: Total Assistance - Patient < 25%    Lower Body Dressing/Undressing Lower body dressing      What is the patient wearing?: Pants     Lower body assist Assist for lower body dressing: 2 Helpers     Toileting Toileting    Toileting assist Assist for toileting: 2 Helpers     Transfers Chair/bed transfer  Transfers assist  Chair/bed transfer activity did not occur: Safety/medical concerns  Chair/bed transfer assist level: 2 Helpers     Locomotion Ambulation   Ambulation assist   Ambulation activity did not occur: Safety/medical concerns          Walk 10 feet activity   Assist  Walk 10 feet activity did  not occur: Safety/medical concerns        Walk 50 feet activity   Assist Walk 50 feet with 2 turns activity did not occur: Safety/medical concerns         Walk 150 feet activity   Assist Walk 150 feet activity did not occur: Safety/medical concerns         Walk 10 feet on uneven surface  activity   Assist Walk 10 feet on uneven surfaces activity did not occur: Safety/medical concerns         Wheelchair     Assist Is the patient using a wheelchair?: Yes Type of Wheelchair: Power Wheelchair activity did not occur: Safety/medical concerns (limited tolerance for upright)  Wheelchair assist level: Contact Guard/Touching assist Max wheelchair distance: 200    Wheelchair 50 feet with 2 turns activity    Assist    Wheelchair 50 feet with 2 turns activity did not occur: Safety/medical concerns   Assist Level: Contact  Guard/Touching assist   Wheelchair 150 feet activity     Assist  Wheelchair 150 feet activity did not occur: Safety/medical concerns   Assist Level: Contact Guard/Touching assist   Blood pressure 140/76, pulse 61, temperature 97.6 F (36.4 C), resp. rate 18, height 5' 8.5" (1.74 m), weight 73.2 kg, SpO2 97 %.  Medical Problem List and Plan: 1.  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 Pt also with lesion involving T1 vertebral body and right side posterior elements with associated pathologic vertebral fracture concerning for metastatic disease (?prostate) or other neoplasm. 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  Status post plan loop recorder  -turn q2-3 hours to reduce risk of pressure ulcers.  --con't PT and OT/CIR- and educated on autonomic dysreflexia/AD- today- that nasal congestion is sign of AD esp isolated or with Headache, anxiety, sense of impending doom.  Con't PT and OT/CIR- wife agreed to family conference at [place] Thursday 2.  Antithrombotics: -DVT/anticoagulation: Venous Doppler studies negative.  Continue Lovenox Pharmaceutical: Lovenox 8/25- needs for a minimum of 3 months from injury 8/28- will have wife taught how to use lovenox- ordered lovenox teaching kit for staff             -antiplatelet therapy: Aspirin 81 mg daily and Plavix 75 mg day x3 weeks and aspirin alone 3. Pain Management: Tylenol   - Lidoderm patches 8pm to 8am -3 of them -kpad for shoulder pain, encouraged use of tylenol 8/23- pain controlled with lidocaine patches and tylenol prn  8/26- pain in shoulders much better- con't regimen 4. Mood/depression/insomnia: Provide emotional support  Celexa 10 mg daily started on 8/12  8/27- will increase Celexa to 20 mg daily-   -melatonin for sleep  -neuropsych intervention would be helpful for him             -antipsychotic agents: N/A 5. Neuropsych: This  patient is capable of making decisions on his own behalf. 6. Skin/Wound Care: Routine skin checks 7. Fluids/Electrolytes/Nutrition: Routine in and outs 8.  Thrombocytopenia.  Resolved 9.  Neurogenic bowel bladder.  Foley catheter tube has been placed for multiple in and out catheterization Lidocaine jelly for bowel program.  8/15- con't Foley due to enlarged scrotum- would be hard to cath- likely won't void-  - bowel program- with suppository/dig stim prn per wife's request 8/23- bowel program last night- good results- still has foley due to enlarged scrotum, but will d/w pt/family more about chronic foley? 8/28- d/w wife/pt- they want  to go with chronic foley since will make the stress less-  10.  Elevated PSA/enlarged prostate.  Family currently not committed to oncology work-up.  Family has asked [place] to not discuss cancer possibility while here   13. Scrotal swelling- will elevate and Hospitalist is checking pro-BNP and giving a dose of Lasix.   8/25- resolved Will monitor/follow.  13. Dysphagia- on D3 thin diet- will con't SLP for swallowing therapy.  14. Resp issues-  Flutter valve- also con't ICS if desired.  15. Leukocytosis-UTI Completed macrobid 16.  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 Spasticity  8/24- pt doesn't want to increase spasticity meds as of yet.  19. Orthostatic hypotension  8/26- asked pt to push fluids- also spoke to head of therapy to get weighted cup and long straw so pt can drink without calling staff. Will also insist on TEDs-  wait on abd binder? 20. Blanchable skin on sacrum  8/26- needs to be turned q2 hours when in bed- and foam dressing placed.  21. Nasal congestion  8/27- not clear if underlying or due to AD- but pt wants ot wait on Flonase- will con't nasal sline.  22. Access to water  8/28- d/w another SCI program- they suggest actually using O2 tubing and attaching to sip and puff and putting into weighted cup- LOWER than sip and puff so doesn't backflow onto pt- will try and arrange   I spent a total of 39 minutes on total care- >50% coordination of care- speaking pt pt/wife, then PT and another SCI facility.   LOS: 17 days A FACE TO FACE EVALUATION WAS PERFORMED  Arnie Clingenpeel 12/30/2020, 2:09 PM

## 2020-12-30 NOTE — Progress Notes (Signed)
Continued bowel program from previous shift. Performed Dig Stim. Pt, had medium bowel movement. Pt. Tolerated well.

## 2020-12-31 LAB — BASIC METABOLIC PANEL
Anion gap: 6 (ref 5–15)
BUN: 13 mg/dL (ref 8–23)
CO2: 25 mmol/L (ref 22–32)
Calcium: 8.7 mg/dL — ABNORMAL LOW (ref 8.9–10.3)
Chloride: 106 mmol/L (ref 98–111)
Creatinine, Ser: 0.54 mg/dL — ABNORMAL LOW (ref 0.61–1.24)
GFR, Estimated: >60 mL/min (ref >60)
Glucose, Bld: 102 mg/dL — ABNORMAL HIGH (ref 70–99)
Potassium: 3.9 mmol/L (ref 3.5–5.1)
Sodium: 137 mmol/L (ref 135–145)

## 2020-12-31 LAB — CBC WITH DIFFERENTIAL/PLATELET
Abs Immature Granulocytes: 0.02 10*3/uL (ref 0.00–0.07)
Basophils Absolute: 0.1 10*3/uL (ref 0.0–0.1)
Basophils Relative: 1 %
Eosinophils Absolute: 0.2 10*3/uL (ref 0.0–0.5)
Eosinophils Relative: 3 %
HCT: 33.8 % — ABNORMAL LOW (ref 39.0–52.0)
Hemoglobin: 11.5 g/dL — ABNORMAL LOW (ref 13.0–17.0)
Immature Granulocytes: 0 %
Lymphocytes Relative: 12 %
Lymphs Abs: 0.7 10*3/uL (ref 0.7–4.0)
MCH: 31.4 pg (ref 26.0–34.0)
MCHC: 34 g/dL (ref 30.0–36.0)
MCV: 92.3 fL (ref 80.0–100.0)
Monocytes Absolute: 0.7 10*3/uL (ref 0.1–1.0)
Monocytes Relative: 13 %
Neutro Abs: 3.9 10*3/uL (ref 1.7–7.7)
Neutrophils Relative %: 71 %
Platelets: 193 10*3/uL (ref 150–400)
RBC: 3.66 MIL/uL — ABNORMAL LOW (ref 4.22–5.81)
RDW: 13.3 % (ref 11.5–15.5)
WBC: 5.4 10*3/uL (ref 4.0–10.5)
nRBC: 0 % (ref 0.0–0.2)

## 2020-12-31 NOTE — Progress Notes (Signed)
Occupational Therapy Session Note  Patient Details  Name: [name] MRN: [id number] Date of Birth: [birth year]  Today's Date: 12/31/2020 OT Individual Time: 1583-0940 OT Individual Time Calculation (min): 87 min    Short Term Goals: Week 3:  OT Short Term Goal 1 (Week 3): Pt will assist in UB bathing with AE as needed to complete UB bathing with mod assist OT Short Term Goal 2 (Week 3): Pt will engage in rolling with max assist of one caregiver to decrease burden of care with toileting OT Short Term Goal 3 (Week 3): Pt will maintain static sitting balance 2 mins with min assist in preparation for self-care tasks.  Skilled Therapeutic Interventions/Progress Updates:    Pt greeted at time of session semireclined in bed resting agreeable to OT session and no pain reported. Initial part of session spent on building rapport and pt wanting to brush teeth, performed oral hygiene with Mod/Max overall with U-cuff placed on R hand, therapist providing distal support at elbow and guidance of hand. Majority of session spent focusing on using O2 tubing with coban to create adaptive equipment to place a long flexible "straw" along sip & puff call bell for pt to drink water while bed level/up in chair. Pt testing with good result and liking this adaptation. Pt agreeable to get OOB, donned TEDS and pants bed level dependent and 2 helpers respectively, assist from NT for LB dress and transfer. Rolling L/R total/dependent for pants and placement of lift pad. 2 assist for maximove transfer bed > power chair and set up with 2 assist for positioning. Pt very specific, needing extended time for all tasks, directing his care, etc. Pt up in power chair with sip & puff in reach and water as well.  Therapy Documentation Precautions:  Precautions Precautions: Back Precaution Comments: pathologic thoracic fracture- back precautions for safety Restrictions Weight Bearing Restrictions: No     Therapy/Group:  Individual Therapy  Erasmo Score 12/31/2020, 7:21 AM

## 2020-12-31 NOTE — Progress Notes (Signed)
Physical Therapy Session Note  Patient Details  Name: [name] MRN: [id number] Date of Birth: 1945/12/08  Today's Date: 12/31/2020 PT Individual Time: 1300-1400; 1545-1700 PT Individual Time Calculation (min): 60 min and 75 min  Short Term Goals: Week 3:  PT Short Term Goal 1 (Week 3): =LTG due to ELOS  Skilled Therapeutic Interventions/Progress Updates:    Session 1: Pt received seated in PWC in room, agreeable to PT session. No complaints of pain. Session focus on seating evaluation with Fleet Contras, ATP from [employer] to determine best fit of w/c for safe d/c home. Pt and family unsure at this time if they want a TIS chair or PWC. ATP and PT recommending use of a PWC for increased independence with mobility, increased independence with pressure relief, and increased independence with repositioning. Patient to discuss options with family and decide on what type of chair he wants to use at home. Also discussed transportation options regarding transporting a PWC and/or a TIS chair and recommending use of a handicap [employer]ena[employer] Niece for transport of PWC. Discussed pressure relief schedule (every 15-20 min for 2 min) and pt reports he has been able to perform pressure relief independently and is performing every 20 min. Discussed BP management and BP concerns with SCI including OH and AD, pt understanding of education. Pt left seated in PWC in room with needs in reach at end of session.  Session 2: Pt received seated in PWC in room, agreeable to PT session. No complaints of pain. PWC mobility x 200 ft with CGA needed to place RUE on controller. Pt able to navigate hospital environment of hallways with good obstacle avoidance. Maxi move transfer PWC to tilt table. Session focus on use of tilt table for improved upright tolerance and BLE WBing. Pt reports ability to move RLE this date with some hip IR/ER noted. See below for BP while on tilt table, pt able to tolerate up to 40 degrees this date:  Supine: 140/80  (with abdominal binder and thigh-high TEDs) 20 degrees: not tested, asymptomatic 30 degrees: 95/57 40 degrees: 73/48, "woozy" 20 degrees: 96/59, symptoms resolve 30 degrees: 81/50, "woozy" Supine: 122/76, symptoms resolve  Pt returned to [facility] then returned to bed via maxi move. Pt reports BM incontinence. Rolling L/R with max A for brief change and pericare. Pt found to not be incontinent but clean brief placed on him. Pt left supine in bed in care of NT at end of session.  Therapy Documentation Precautions:  Precautions Precautions: Back Precaution Comments: pathologic thoracic fracture- back precautions for safety Restrictions Weight Bearing Restrictions: No     Therapy/Group: Individual Therapy   Peter Congo, PT, DPT, CSRS  12/31/2020, 5:04 PM

## 2020-12-31 NOTE — Progress Notes (Signed)
SW Auria asked Clinical research associate if Clinical research associate told patients wife not to come in for training today. Writer never spoke to patients wife, nor never told patient to tell his family not to come in for training.  However patient did express this AM that he was scheduled for family training. Writer did inform patient that when checked patients name was not on training list for today, however family was welcome to come in and therapy would go over training. Patient was scheduled for 9 am Speech, 10:15 OT. Since it was almost 9 am and family would not make it in time writer suggested that family to come in by 1:00 pm since patient was having W/C evaluation and PT. Patient expressed that one son works at [employer] and has prior commitments and possible the other son could come. While this conversation took place in patients room patient care tec Wille Glaser was witnessed to the whole conversation with this resident.

## 2020-12-31 NOTE — Plan of Care (Signed)
  Problem: RH Swallowing Goal: LTG Patient will consume least restrictive diet using compensatory strategies with assistance (SLP) Description: LTG:  Patient will consume least restrictive diet using compensatory strategies with assistance (SLP) Outcome: Completed/Met   Problem: RH Expression Communication Goal: LTG Patient will increase speech intelligibility (SLP) Description: LTG: Patient will increase speech intelligibility at word/phrase/conversation level with cues, % of the time (SLP) Outcome: Completed/Met   Problem: RH Problem Solving Goal: LTG Patient will demonstrate problem solving for (SLP) Description: LTG:  Patient will demonstrate problem solving for basic/complex daily situations with cues  (SLP) Outcome: Completed/Met

## 2020-12-31 NOTE — Progress Notes (Signed)
PROGRESS NOTE   Subjective/Complaints: Patient has no complaints this morning Wife was under impression that family meeting was today and both her sons took off work- discussed with Lovena Le- actual plan was for Thursday but they can be part of her session at 1pm today.   ROS:    Pt denies SOB, abd pain, CP, N/V/C/D, and vision changes, +constipation as per nursing note      Objective:   No results found. Recent Labs    12/31/20 0551  WBC 5.4  HGB 11.5*  HCT 33.8*  PLT 193    Recent Labs    12/31/20 0551  NA 137  K 3.9  CL 106  CO2 25  GLUCOSE 102*  BUN 13  CREATININE 0.54*  CALCIUM 8.7*     Intake/Output Summary (Last 24 hours) at 12/31/2020 1158 Last data filed at 12/31/2020 0800 Gross per 24 hour  Intake 427 ml  Output 1625 ml  Net -1198 ml        Physical Exam: Vital Signs Blood pressure 127/68, pulse 65, temperature 98.1 F (36.7 C), temperature source Oral, resp. rate 16, height 5' 8.5" (1.74 m), weight 73.2 kg, SpO2 98 %. Gen: no distress, normal appearing HEENT: oral mucosa pink and moist, NCAT Cardio: Reg rate Chest: normal effort, normal rate of breathing Abd: soft, non-distended Ext: no edema Psych: pleasant, normal affect Skin: intact Neuro:  MAS of 1+ GU: scrotal swelling resolved- foley in place Motor- lacking ~ 5 degrees of L elbow extension RUE: biceps 4/5, WE 3-/5, triceps 2-/5, grip 2-/5, finger abd 0/5, unchanged LUE: biceps 4/5, WE 3-/5, triceps 0/5, grip 1/5, and finger abd 0/5,stable LE's 0/5 in HF, KE, DF, APF. Senses gross touch in both legs.  No change in Legs  Assessment/Plan: 1. Functional deficits which require 3+ hours per day of interdisciplinary therapy in a comprehensive inpatient rehab setting. Physiatrist is providing close team supervision and 24 hour management of active medical problems listed below. Physiatrist and rehab team continue to assess  barriers to discharge/monitor patient progress toward functional and medical goals  Care Tool:  Bathing        Body parts bathed by helper: Right arm, Left arm, Chest, Abdomen, Front perineal area, Buttocks, Right upper leg, Left upper leg, Right lower leg, Left lower leg, Face     Bathing assist Assist Level: 2 Helpers     Upper Body Dressing/Undressing Upper body dressing   What is the patient wearing?: Pull over shirt    Upper body assist Assist Level: Total Assistance - Patient < 25%    Lower Body Dressing/Undressing Lower body dressing      What is the patient wearing?: Pants     Lower body assist Assist for lower body dressing: 2 Helpers     Toileting Toileting    Toileting assist Assist for toileting: 2 Helpers     Transfers Chair/bed transfer  Transfers assist  Chair/bed transfer activity did not occur: Safety/medical concerns  Chair/bed transfer assist level: 2 Helpers     Locomotion Ambulation   Ambulation assist   Ambulation activity did not occur: Safety/medical concerns          Walk  10 feet activity   Assist  Walk 10 feet activity did not occur: Safety/medical concerns        Walk 50 feet activity   Assist Walk 50 feet with 2 turns activity did not occur: Safety/medical concerns         Walk 150 feet activity   Assist Walk 150 feet activity did not occur: Safety/medical concerns         Walk 10 feet on uneven surface  activity   Assist Walk 10 feet on uneven surfaces activity did not occur: Safety/medical concerns         Wheelchair     Assist Is the patient using a wheelchair?: Yes Type of Wheelchair: Power Wheelchair activity did not occur: Safety/medical concerns (limited tolerance for upright)  Wheelchair assist level: Contact Guard/Touching assist Max wheelchair distance: 200    Wheelchair 50 feet with 2 turns activity    Assist    Wheelchair 50 feet with 2 turns activity did not  occur: Safety/medical concerns   Assist Level: Contact Guard/Touching assist   Wheelchair 150 feet activity     Assist  Wheelchair 150 feet activity did not occur: Safety/medical concerns   Assist Level: Contact Guard/Touching assist   Blood pressure 127/68, pulse 65, temperature 98.1 F (36.7 C), temperature source Oral, resp. rate 16, height 5' 8.5" (1.74 m), weight 73.2 kg, SpO2 98 %.  Medical Problem List and Plan: 1.  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 Pt also with lesion involving T1 vertebral body and right side posterior elements with associated pathologic vertebral fracture concerning for metastatic disease (?prostate) or other neoplasm. 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  Status post plan loop recorder  -turn q2-3 hours to reduce risk of pressure ulcers.  --con't PT and OT/CIR- and educated on autonomic dysreflexia/AD- today- that nasal congestion is sign of AD esp isolated or with Headache, anxiety, sense of impending doom.  Continue PT and OT/CIR- wife agreed to family conference at [place] Thursday 2.  Impaired mobility: -DVT/anticoagulation: Venous Doppler studies negative.  Continue Lovenox Pharm[employer]: Lovenox 8/25- needs for a minimum of 3 months from injury 8/28- will have wife taught how to use lovenox- ordered lovenox teaching kit for staff             -antiplatelet therapy: Continue Aspirin 81 mg daily and Plavix 75 mg day x3 weeks and aspirin alone 3. Pain: Tylenol   - Lidoderm patches 8pm to 8am -3 of them -kpad for shoulder pain, encouraged use of tylenol 8/23- pain controlled with lidocaine patches and tylenol prn  8/29- pain in shoulders much better- continue regimen 4. Mood/depression/insomnia: Provide emotional support  Celexa 10 mg daily started on 8/12  8/27- will increase Celexa to 20 mg daily-   -melatonin for sleep  -neuropsych intervention  would be helpful for him             -antipsychotic agents: N/A 5. Neuropsych: This patient is capable of making decisions on his own behalf. 6. Skin/Wound Care: Routine skin checks 7. Fluids/Electrolytes/Nutrition: Routine in and outs 8.  Thrombocytopenia.  Resolved 9.  Neurogenic bowel bladder.  Foley catheter tube has been placed for multiple in and out catheterization Lidocaine jelly for bowel program.  8/15- con't Foley due to enlarged scrotum- would be hard to cath- likely won't void-  - bowel program- with suppository/dig stim prn per wife's request 8/23- bowel program last night- good results- still has foley due  to enlarged scrotum, but will d/w pt/family more about chronic foley? 8/28- d/w wife/pt- they want to go with chronic foley since will make the stress less-  8/29: no results with bowel program last night: will request prune juice with meals.  10.  Elevated PSA/enlarged prostate.  Family currently not committed to oncology work-up.  Family has asked [place] to not discuss cancer possibility while here   29. Scrotal swelling- will elevate and Hospitalist is checking pro-BNP and giving a dose of Lasix.   8/25- resolved Will monitor/follow.  13. Dysphagia- on D3 thin diet- will con't SLP for swallowing therapy.  14. Resp issues-  Flutter valve- also con't ICS if desired.  15. Leukocytosis-UTI Completed macrobid 16.  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 Spasticity  8/24- pt doesn't want to increase spasticity meds as of yet.  19.  Orthostatic hypotension  8/26- asked pt to push fluids- also spoke to head of therapy to get weighted cup and long straw so pt can drink without calling staff. Will also insist on TEDs- wait on abd binder? 20. Blanchable skin on sacrum  8/26- needs to be turned q2 hours when in bed- and foam dressing placed.  21. Nasal congestion  8/27- not clear if underlying or due to AD- but pt wants ot wait on Flonase- will con't nasal sline.  22. Access to water  8/28- d/w another SCI program- they suggest actually using O2 tubing and attaching to sip and puff and putting into weighted cup- LOWER than sip and puff so doesn't backflow onto pt- will try and arrange  LOS: 18 days A FACE TO FACE EVALUATION WAS [place] 12/31/2020, 11:58 AM

## 2020-12-31 NOTE — Progress Notes (Signed)
Physical Therapy Weekly Progress Note  Patient Details  Name: [name] MRN: [id number] Date of Birth: 05/16/1945  Beginning of progress report period: December 24, 2020 End of progress report period: December 31, 2020  Today's Date: 12/31/2020  Patient has met 1 of 4 short term goals.  Patient is making slow progress towards therapy goals. He is currently assist x 2 for rolling L/R in bed, for supine to/from sit, and total A x 2 for SB transfers bed to/from wheelchair. Pt also continues to require max A for sitting balance due to impaired core control. Patient was able to initiate power wheelchair mobility over the past week and is currently min A for mobility due to UE weakness and fatigue when driving chair. Patient will participate in a seating evaluation to determine best fit regarding wheeled mobility upon d/c home, hands-on family education with wife and son, and a family meeting to ensure that patient and family are on the same page as the rehab team regarding estimated level of function upon d/c home.  Patient continues to demonstrate the following deficits muscle weakness, muscle joint tightness, and muscle paralysis, decreased cardiorespiratoy endurance, abnormal tone, unbalanced muscle activation, and decreased coordination, and decreased sitting balance, decreased postural control, and decreased balance strategies and therefore will continue to benefit from skilled PT intervention to increase functional independence with mobility.  Patient progressing toward long term goals..  Continue plan of care.  PT Short Term Goals Week 2:  PT Short Term Goal 1 (Week 2): Patient will initiate power wheelchair mobility. PT Short Term Goal 1 - Progress (Week 2): Met PT Short Term Goal 2 (Week 2): Patient will direct slide board transfers 50% of tasks. PT Short Term Goal 2 - Progress (Week 2): Progressing toward goal PT Short Term Goal 3 (Week 2): Patient will tolerate EOB balance activites for 20  minutes. PT Short Term Goal 3 - Progress (Week 2): Progressing toward goal PT Short Term Goal 4 (Week 2): Patient will tolerate 10 minutes sitting balance activity prior to needing supported rest breaks. PT Short Term Goal 4 - Progress (Week 2): Progressing toward goal Week 3:  PT Short Term Goal 1 (Week 3): =LTG due to ELOS   Therapy Documentation Precautions:  Precautions Precautions: Back Precaution Comments: pathologic thoracic fracture- back precautions for safety Restrictions Weight Bearing Restrictions: No   Therapy/Group: Individual Therapy   Taylor Turkalo, PT, DPT, CSRS 12/31/2020, 7:45 AM  

## 2020-12-31 NOTE — Progress Notes (Signed)
Bowel program completed. Patient tolerated well. Pt had medium type 6 BM after the suppository  and medium type 6 bowel after 15 min with stimulation. Peri care done.

## 2020-12-31 NOTE — Progress Notes (Signed)
Speech Language Pathology Discharge Summary  Patient Details  Name: [name] MRN: [id number] Date of Birth: November 13, 1945  Today's Date: 12/31/2020 SLP Individual Time: 0900-0930 SLP Individual Time Calculation (min): 30 min  Skilled Therapeutic Interventions: Patient agreeable to skilled ST intervention with focus on EMST follow up education. Patient successfully verbalized and demonstrated use of EMST device and utilization of rate of perceived exertion scale. Patient was able to explain safety considerations on when to discontinue use including any symptoms of lightheadedness, dizziness, and/or SOB. Patient has consistently demonstrated effective self monitoring skills and self awareness using device, and appears appropriate to continue at independent level with the exception of someone holding device 2/2 limited upper extremity movement. Patient with no further skilled ST needs at this time. All goals met. ST to sign off. Patient verbalized understanding and agreement. Patient was left in bed with alarm activated and immediate needs within reach at end of session. Continue per current plan of care.    Patient has met 3 of 3 long term goals.  Patient to discharge at overall Modified Independent level.   Reasons goals not met: NA; all goals met   Clinical Impression/Discharge Summary: Patient has made excellent progress and has met 4 of 4 long-term goals this admission due to improved swallow function and diet tolerance, cognitive skills including recall and complex problem solving, breath support for speech, and endurance which positively impacts speech and swallow functions. Patient consistently demonstrated proficient use of EMST (expiratory muscle strength training) device and is encouraged to continue use with support of his spouse to hold device due to limited upper extremity movement. Patient is currently an overall mod I for higher level cognitive tasks and speech. Patient is currently  tolerating regular textures and thin liquids and is independent with implementation of safe swallow techniques and strategies. He continues to require feeding assistance from a mobility standpoint. All education is complete. Follow up ST services are not clinically indicated at this time. Patient to discharge home to his spouse with support of their son. Further education with family is not necessary at this time. ST to sign off. No additional needs identified at this time.  Care Partner:  Caregiver Able to Provide Assistance: Yes  Type of Caregiver Assistance: Physical;Cognitive  Recommendation:  None     Equipment: NA (patient was provided with EMST device)   Reasons for discharge: Treatment goals met   Patient/Family Agrees with Progress Made and Goals Achieved: Yes  Janese Radabaugh T Tailey Top 12/31/2020, 4:18 PM

## 2021-01-01 NOTE — Progress Notes (Signed)
Patient ID: [name], male   DOB: 1945/11/15, 75 y.o.   MRN: [id number]  SW met with pt in room to provide updates from team conference, and inform on family mtg on 9/1 at [facility] in his room which will be a conference call with his wife, children, and medical team.   Loralee Pacas, MSW, [place] Office: [phone] Cell: [phone] Fax: [phone]

## 2021-01-01 NOTE — Patient Care Conference (Signed)
Inpatient RehabilitationTeam Conference and Plan of Care Update Date: 01/01/2021   Time: 11:04 AM    Patient Name: [name]      Medical Record Number: [id number]  Date of Birth: 1946/02/22 Sex: Male         Room/Bed: 4M08C/4M08C-01 Payor Info: Payor: MEDICARE / Plan: MEDICARE PART A AND B / Product Type: *No Product type* /    Admit Date/Time:  12/13/2020  6:41 PM  Primary Diagnosis:  Quadriplegia [facility])  Hospital Problems: Principal Problem:   Quadriplegia (HCC) Active Problems:   Neurogenic bowel   Neurogenic bladder   Scrotal swelling   Foley catheter in place on admission   Dysphagia   Acute lower UTI   Labile blood pressure    Expected Discharge Date: Expected Discharge Date: 01/10/21  Team Members Present: Physician leading conference: Dr. Genice Rouge Social Worker Present: Cecile Sheerer, LCSWA Nurse Present: Kennyth Arnold, RN PT Present: Peter Congo, PT OT Present: Ardis Rowan, COTA;Jennifer Katrinka Blazing, OT PPS Coordinator present : Fae Pippin, SLP     Current Status/Progress Goal Weekly Team Focus  Bowel/Bladder   Foley in place, Bowel program  Pt gain cont of B/B.  continue bowel program, foley care q shift and PRN   Swallow/Nutrition/ Hydration   Regular textures, thin liquids. Mod I for swallow strategies  Mod I  ST signed off. Tolerating without difficulty   ADL's   bathing-dependent (night bath); UB dressing-tot A; LB dressing-dependent; bed mobility-tot A+2; sitting balance-tot A; SB tranfsers-tot A+2  mod A overall  bed mobility, sitting balance, BADL retraining, family educaiton, activity tolerance   Mobility   max A rolling, +2 total A supine to/from sit and SB transfers, PWC mobility CGA x 200 ft  total A transfers, mod I PWC mobility  sitting balance, transfers, PWC mobility, family education   Communication   Mod I  Mod I  ST signed off - tolerating EMST independently with set-up to hold.   Safety/Cognition/ Behavioral Observations  mod  I  mod I  ST signed off. Cognition functional   Pain   No c/o pain  pain<3  assess pain q shift and PRN   Skin   intact  Pt skin to remain intact  assess skin q shift and PRN.     Discharge Planning:  Pt will d/c to home with 24/7 care from pt wife.   Team Discussion: Can move right foot, small movement in right hip, plantar flexion. Teach Lovenox, foley irrigation, NT's to report all BP's to nursing due to autonomic dysreflexia. Continue foley and bowel program. Lidocaine patches have helped. Reposition in chair every 20 min. Barrier cream to redness on bottom. Family conference call Thursday in patient room. Max assist for rolling, +2 slide board transfers. Power chair more functional than tilt-n-space. Right arm more triceps engagement. U-cuff for brushing teeth, grooming. Becomes whoozy with quick transitions. Using the straw in the WC.  Patient on target to meet rehab goals: yes  *See Care Plan and progress notes for long and short-term goals.   Revisions to Treatment Plan:  Education on foley, AD, Lovenox, bowel program.  Teaching Needs: Family education, medication management, pain management, skin/wound care, Lovenox education, foley irrigation education, bowel program education, AD education, transfer training, power WC education, balance training, endurance training, safety awareness.  Current Barriers to Discharge: Decreased caregiver support, Medical stability, Home enviroment access/layout, Neurogenic bowel and bladder, Wound care, Lack of/limited family support, Weight, and Medication compliance  Possible Resolutions to Barriers: Continue current  medications, provide emotional support.     Medical Summary Current Status: had BM with bowel program; chronic foley; barrier cream on bottom and w/c pressure relief q20 minutes; occ orthostaitc hypotension and Autonomic dysreflexia.  Barriers to Discharge: Decreased family/caregiver support;Home enviroment  access/layout;Incontinence;Neurogenic Bowel & Bladder;Wound care;Other (comments);Medical stability  Barriers to Discharge Comments: quadriplegia- has new ability to move R foot; family training this week- family conference Thursday 10am Possible Resolutions to [employer] Focus: need to teach pt's family bowel program as well as lovenox and foley mgmt- is focus; also d/w pt/family about power w/c getting a little triceps/new- using ucuff;  AD education- .  d/c 9/8-   Continued Need for Acute Rehabilitation Level of Care: The patient requires daily medical management by a physician with specialized training in physical medicine and rehabilitation for the following reasons: Direction of a multidisciplinary physical rehabilitation program to maximize functional independence : Yes Medical management of patient stability for increased activity during participation in an intensive rehabilitation regime.: Yes Analysis of laboratory values and/or radiology reports with any subsequent need for medication adjustment and/or medical intervention. : Yes   I attest that I was present, lead the team conference, and concur with the assessment and plan of the team.   Tennis Must 01/01/2021, 4:26 PM

## 2021-01-01 NOTE — Progress Notes (Signed)
Educated wife on foley cath care, cleaning, S/S of infection, irrigation and emptying foley bag. Educated wife on bowel program. Patient requests suppository at 6:30 PM wife was still here however declined to be in the room when I was giving the patient the suppository. Educated on Lovenox injections. Wife declined giving evening injection.  Also provided printed education on all the above.

## 2021-01-01 NOTE — Progress Notes (Signed)
PROGRESS NOTE   Subjective/Complaints:  Pt reports he can move his R foot! Using straw much easier with the oxygen tubing being used- and can use in bed! Going to arrange for family training later in week- family conference Thursday at [place]:   Pt denies SOB, abd pain, CP, N/V/C/D, and vision changes      Objective:   No results found. Recent Labs    12/31/20 0551  WBC 5.4  HGB 11.5*  HCT 33.8*  PLT 193    Recent Labs    12/31/20 0551  NA 137  K 3.9  CL 106  CO2 25  GLUCOSE 102*  BUN 13  CREATININE 0.54*  CALCIUM 8.7*     Intake/Output Summary (Last 24 hours) at 01/01/2021 0951 Last data filed at 01/01/2021 0700 Gross per 24 hour  Intake 340 ml  Output 2000 ml  Net -1660 ml        Physical Exam: Vital Signs Blood pressure (!) 144/83, pulse 67, temperature 98.5 F (36.9 C), resp. rate 19, height 5' 8.5" (1.74 m), weight 73.2 kg, SpO2 98 %.    General: awake, alert, appropriate, laying in bed; long straw attached to sip and puff apparatus;  NAD HENT: conjugate gaze; oropharynx moist CV: regular rate; no JVD Pulmonary: CTA B/L; no W/R/R- good air movement GI: soft, NT, ND, (+)BS Psychiatric: appropriate. happy Neurological: Ox3 MAS of 1+- no significant change GU: scrotal swelling resolved- foley in place Motor- lacking ~ 5 degrees of L elbow extension RUE: biceps 4/5, WE 3-/5, triceps 2-/5, grip 2-/5, finger abd 0/5, unchanged LUE: biceps 4/5, WE 3-/5, triceps 0/5, grip 1/5, and finger abd 0/5,stable LE's 0/5 in HF, KE, DF, APF except NEW movement of R foot- can wiggle toes- HF 1/5, KE, 0/5, DF 0/5 and PF 1/5   Assessment/Plan: 1. Functional deficits which require 3+ hours per day of interdisciplinary therapy in a comprehensive inpatient rehab setting. Physiatrist is providing close team supervision and 24 hour management of active medical problems listed below. Physiatrist and  rehab team continue to assess barriers to discharge/monitor patient progress toward functional and medical goals  Care Tool:  Bathing        Body parts bathed by helper: Right arm, Left arm, Chest, Abdomen, Front perineal area, Buttocks, Right upper leg, Left upper leg, Right lower leg, Left lower leg, Face     Bathing assist Assist Level: 2 Helpers     Upper Body Dressing/Undressing Upper body dressing   What is the patient wearing?: Pull over shirt    Upper body assist Assist Level: Total Assistance - Patient < 25%    Lower Body Dressing/Undressing Lower body dressing      What is the patient wearing?: Pants     Lower body assist Assist for lower body dressing: 2 Helpers     Toileting Toileting    Toileting assist Assist for toileting: 2 Helpers     Transfers Chair/bed transfer  Transfers assist  Chair/bed transfer activity did not occur: Safety/medical concerns  Chair/bed transfer assist level: Dependent - mechanical lift     Locomotion Ambulation   Ambulation assist   Ambulation activity did  not occur: Safety/medical concerns          Walk 10 feet activity   Assist  Walk 10 feet activity did not occur: Safety/medical concerns        Walk 50 feet activity   Assist Walk 50 feet with 2 turns activity did not occur: Safety/medical concerns         Walk 150 feet activity   Assist Walk 150 feet activity did not occur: Safety/medical concerns         Walk 10 feet on uneven surface  activity   Assist Walk 10 feet on uneven surfaces activity did not occur: Safety/medical concerns         Wheelchair     Assist Is the patient using a wheelchair?: Yes Type of Wheelchair: Manual Wheelchair activity did not occur: Safety/medical concerns (limited tolerance for upright)  Wheelchair assist level: Contact Guard/Touching assist Max wheelchair distance: 200    Wheelchair 50 feet with 2 turns activity    Assist     Wheelchair 50 feet with 2 turns activity did not occur: Safety/medical concerns   Assist Level: Contact Guard/Touching assist   Wheelchair 150 feet activity     Assist  Wheelchair 150 feet activity did not occur: Safety/medical concerns   Assist Level: Contact Guard/Touching assist   Blood pressure (!) 144/83, pulse 67, temperature 98.5 F (36.9 C), resp. rate 19, height 5' 8.5" (1.74 m), weight 73.2 kg, SpO2 98 %.  Medical Problem List and Plan: 1.  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 Pt also with lesion involving T1 vertebral body and right side posterior elements with associated pathologic vertebral fracture concerning for metastatic disease (?prostate) or other neoplasm. 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  Status post plan loop recorder  -turn q2-3 hours to reduce risk of pressure ulcers.  --con't PT and OT/CIR- and educated on autonomic dysreflexia/AD- today- that nasal congestion is sign of AD esp isolated or with Headache, anxiety, sense of impending doom.  Continue PT and OT/CIR- wife agreed to family conference at [place] Thursday -con't PT and OT- CIR- sip and puff call bell and water at sip and puff 2.  Impaired mobility: -DVT/anticoagulation: Venous Doppler studies negative.  Continue Lovenox Pharmaceutical: Lovenox 8/25- needs for a minimum of 3 months from injury 8/28- will have wife taught how to use lovenox- ordered lovenox teaching kit for staff             -antiplatelet therapy: Continue Aspirin 81 mg daily and Plavix 75 mg day x3 weeks and aspirin alone 3. Pain: Tylenol   - Lidoderm patches 8pm to 8am -3 of them -kpad for shoulder pain, encouraged use of tylenol 8/30- pain controlled- con't regimen 4. Mood/depression/insomnia: Provide emotional support  Celexa 10 mg daily started on 8/12  8/27- will increase Celexa to 20 mg daily-   -melatonin for  sleep  -neuropsych intervention would be helpful for him             -antipsychotic agents: N/A 5. Neuropsych: This patient is capable of making decisions on his own behalf. 6. Skin/Wound Care: Routine skin checks 7. Fluids/Electrolytes/Nutrition: Routine in and outs 8.  Thrombocytopenia.  Resolved 9.  Neurogenic bowel bladder.  Foley catheter tube has been placed for multiple in and out catheterization Lidocaine jelly for bowel program.  8/15- con't Foley due to enlarged scrotum- would be hard to cath- likely won't void-  - bowel program- with suppository/dig stim  prn per wife's request 8/23- bowel program last night- good results- still has foley due to enlarged scrotum, but will d/w pt/family more about chronic foley? 8/28- d/w wife/pt- they want to go with chronic foley since will make the stress less-  8/29: no results with bowel program last night: will request prune juice with meals.  8/30- had 2 medium BM's last night with bowel program 10.  Elevated PSA/enlarged prostate.  Family currently not committed to oncology work-up.  Family has asked [place] to not discuss cancer possibility while here   97. Scrotal swelling- will elevate and Hospitalist is checking pro-BNP and giving a dose of Lasix.   8/25- resolved Will monitor/follow.  13. Dysphagia- on D3 thin diet- will con't SLP for swallowing therapy.  14. Resp issues-  Flutter valve- also con't ICS if desired.  15. Leukocytosis-UTI Completed macrobid 16.  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 Spasticity  8/24- pt doesn't want to  increase spasticity meds as of yet.  19. Orthostatic hypotension  8/26- asked pt to push fluids- also spoke to head of therapy to get weighted cup and long straw so pt can drink without calling staff. Will also insist on TEDs- wait on abd binder? 20. Blanchable skin on sacrum  8/26- needs to be turned q2 hours when in bed- and foam dressing placed.  21. Nasal congestion  8/27- not clear if underlying or due to AD- but pt wants ot wait on Flonase- will con't nasal sline.  22. Access to water  8/28- d/w another SCI program- they suggest actually using O2 tubing and attaching to sip and puff and putting into weighted cup- LOWER than sip and puff so doesn't backflow onto pt- will try and arrange  8/30- is working!  LOS: 19 days A FACE TO FACE EVALUATION WAS PERFORMED  Jaleesa Cervi 01/01/2021, 9:51 AM

## 2021-01-01 NOTE — Progress Notes (Signed)
Physical Therapy Session Note  Patient Details  Name: [name] MRN: [id number] Date of Birth: [birth year]  Today's Date: 01/01/2021 PT Individual Time: 1130-1200; 7322-0254 PT Individual Time Calculation (min): 30 min and 60 min PT Missed Time: 15 min Missed Time Reason: patient fatigue  Short Term Goals: Week 3:  PT Short Term Goal 1 (Week 3): =LTG due to ELOS  Skilled Therapeutic Interventions/Progress Updates:    Session 1: Pt received seated in TIS chair in room as PWC charging, agreeable to transfer to [facility] as it has charged more since previous session. Slide board transfer TIS to mat table to [facility] with total A x 2. Pt has onset of "wooziness" while seated EOM, placed in semi-reclined position and has improvement in symptoms. Pt able to drive PWC x 270 ft with CGA needed to place RUE on controller. Pt left semi-reclined in PWC in room with needs in reach at end of session.  Session 2: Pt received seated in PWC in room covered in blankets, reports he feels very cold and has been told he has a temperature. Pt agreeable to PT session, no complaints of pain. Assisted pt with placing RUE on PWC controller and he is able to drive to therapy gym at [facility] level x 150 ft. Slide board transfer PWC to mat table with total A x 2. Pt is max to total A for sitting balance EOM. Pt reports feeling "woozy" while sitting EOM, rates 6/10. Seated BP 95/74. Pt then has onset of fluttering of eyelids and reports increase in "wooziness". Sit to supine total A x 2. Supine BP 113/72 and symptoms improve to 1/10 on "wooziness scale". Education with patient regarding orthostatic hypotension and BP issues with SCI. Pt frustrated by BP issues limiting his ability to functionally participate in therapy session. Returned to sitting with total A x 2. After several minutes pt again has onset of wooziness. Slide board transfer back to w/c then back to bed. Pt left semi-reclined in bed with needs in reach, PRAFOs in place. Pt  missed 15 min of scheduled therapy session due to not feeling well this PM.  Therapy Documentation Precautions:  Precautions Precautions: Back Precaution Comments: pathologic thoracic fracture- back precautions for safety Restrictions Weight Bearing Restrictions: No   Therapy/Group: Individual Therapy   Peter Congo, PT, DPT, CSRS  01/01/2021, 12:24 PM

## 2021-01-01 NOTE — Progress Notes (Signed)
Occupational Therapy Session Note  Patient Details  Name: [name] MRN: [id number] Date of Birth: 01-22-1946  Today's Date: 01/01/2021 OT Individual Time: 2458-0998 OT Individual Time Calculation (min): 84 min    Short Term Goals: Week 3:  OT Short Term Goal 1 (Week 3): Pt will assist in UB bathing with AE as needed to complete UB bathing with mod assist OT Short Term Goal 2 (Week 3): Pt will engage in rolling with max assist of one caregiver to decrease burden of care with toileting OT Short Term Goal 3 (Week 3): Pt will maintain static sitting balance 2 mins with min assist in preparation for self-care tasks.  Skilled Therapeutic Interventions/Progress Updates:    Pt resting in bed upon arrival and ready to get OOB. OT intervention with focus on bed mobility, UB dressing, oral care, sitting balance, education, and activity tolerance to increase independence with BADLs. Rolling in bed R/L with tot A. Dependent for LB dressing supine in bed. PT doffed/donned pullover shirt seated in bed with HOB elevated. Once shirt is doffed to elbows pt able to removed over hands. Pt able to thread pull over shirt over hands but required assistance to complete task of threading shirt and pulling over head. Supine>sit EOB with tot A+2. SB transfer to w/c with tot A+2. Focus shifted to oral care with pt using u-cuff to hold toothbrush. Pt able to initiate motion for brushing teeth with gravity eliminated at elbow. Pt required max A to complete task. Pt remained seated in TIS w/c with iPhone and sip n puff call bell positioned so pt can access.   Therapy Documentation Precautions:  Precautions Precautions: Back Precaution Comments: pathologic thoracic fracture- back precautions for safety Restrictions Weight Bearing Restrictions: No  Pain:  Pt denies pain    Therapy/Group: Individual Therapy  Rich Brave 01/01/2021, 11:02 AM

## 2021-01-02 NOTE — Progress Notes (Signed)
PROGRESS NOTE   Subjective/Complaints:  Pt reports a little more movement in Legs today Also, wife educated on foley and care and lovenox injection- she declined to give injection to try.   Spasms much better this AM once they unpropped/removed pillows under feet.   Wasa bit woozy with prolonged therapy yesterday.  Had a bad charger for power w/c.   ROS:    Pt denies SOB, abd pain, CP, N/V/C/D, and vision changes      Objective:   No results found. Recent Labs    12/31/20 0551  WBC 5.4  HGB 11.5*  HCT 33.8*  PLT 193    Recent Labs    12/31/20 0551  NA 137  K 3.9  CL 106  CO2 25  GLUCOSE 102*  BUN 13  CREATININE 0.54*  CALCIUM 8.7*     Intake/Output Summary (Last 24 hours) at 01/02/2021 1844 Last data filed at 01/02/2021 1311 Gross per 24 hour  Intake 700 ml  Output 1800 ml  Net -1100 ml        Physical Exam: Vital Signs Blood pressure 111/71, pulse 64, temperature 98.7 F (37.1 C), temperature source Oral, resp. rate 16, height 5' 8.5" (1.74 m), weight 73.2 kg, SpO2 98 %.     General: awake, alert, appropriate, laying in bed; has water available; NAD HENT: conjugate gaze; oropharynx moist CV: regular rate; no JVD Pulmonary: CTA B/L; no W/R/R- good air movement GI: soft, NT, ND, (+)BS Psychiatric: appropriate Neurological: Ox3 MAS of 1+-no change, but no spasms with ROM GU: scrotal swelling resolved- foley in place Motor- lacking ~ 5 degrees of L elbow extension RUE: biceps 4/5, WE 3-/5, triceps 2-/5, grip 2-/5, finger abd 0/5, unchanged LUE: biceps 4/5, WE 3-/5, triceps 0/5, grip 1/5, and finger abd 0/5,stable LE's 0/5 in HF, KE, DF, APF except NEW movement of R foot- can wiggle toes- HF 1/5, KE, 0/5, DF 0/5 and PF 1/5- today-  had a little questionable movement of LLE at knee - had definitely at R knee-    Assessment/Plan: 1. Functional deficits which require 3+ hours per day of  interdisciplinary therapy in a comprehensive inpatient rehab setting. Physiatrist is providing close team supervision and 24 hour management of active medical problems listed below. Physiatrist and rehab team continue to assess barriers to discharge/monitor patient progress toward functional and medical goals  Care Tool:  Bathing        Body parts bathed by helper: Right arm, Left arm, Chest, Abdomen, Front perineal area, Buttocks, Right upper leg, Left upper leg, Right lower leg, Left lower leg, Face     Bathing assist Assist Level: 2 Helpers     Upper Body Dressing/Undressing Upper body dressing   What is the patient wearing?: Pull over shirt    Upper body assist Assist Level: Total Assistance - Patient < 25%    Lower Body Dressing/Undressing Lower body dressing      What is the patient wearing?: Pants     Lower body assist Assist for lower body dressing: 2 Helpers     Toileting Toileting    Toileting assist Assist for toileting: 2 Helpers     Transfers Chair/bed  transfer  Transfers assist  Chair/bed transfer activity did not occur: Safety/medical concerns  Chair/bed transfer assist level: 2 Helpers     Locomotion Ambulation   Ambulation assist   Ambulation activity did not occur: Safety/medical concerns          Walk 10 feet activity   Assist  Walk 10 feet activity did not occur: Safety/medical concerns        Walk 50 feet activity   Assist Walk 50 feet with 2 turns activity did not occur: Safety/medical concerns         Walk 150 feet activity   Assist Walk 150 feet activity did not occur: Safety/medical concerns         Walk 10 feet on uneven surface  activity   Assist Walk 10 feet on uneven surfaces activity did not occur: Safety/medical concerns         Wheelchair     Assist Is the patient using a wheelchair?: Yes Type of Wheelchair: Power Wheelchair activity did not occur: Safety/medical concerns (limited  tolerance for upright)  Wheelchair assist level: Contact Guard/Touching assist Max wheelchair distance: 150'    Wheelchair 50 feet with 2 turns activity    Assist    Wheelchair 50 feet with 2 turns activity did not occur: Safety/medical concerns   Assist Level: Contact Guard/Touching assist   Wheelchair 150 feet activity     Assist  Wheelchair 150 feet activity did not occur: Safety/medical concerns   Assist Level: Contact Guard/Touching assist   Blood pressure 111/71, pulse 64, temperature 98.7 F (37.1 C), temperature source Oral, resp. rate 16, height 5' 8.5" (1.74 m), weight 73.2 kg, SpO2 98 %.  Medical Problem List and Plan: 1.  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 Pt also with lesion involving T1 vertebral body and right side posterior elements with associated pathologic vertebral fracture concerning for metastatic disease (?prostate) or other neoplasm. 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  Status post plan loop recorder  -turn q2-3 hours to reduce risk of pressure ulcers.  --con't PT and OT/CIR- and educated on autonomic dysreflexia/AD- today- that nasal congestion is sign of AD esp isolated or with Headache, anxiety, sense of impending doom.  Continue PT and OT/CIR- wife agreed to family conference at [place] Thursday -con't PT and OT_ family meeting tomorrow 2.  Impaired mobility: -DVT/anticoagulation: Venous Doppler studies negative.  Continue Lovenox Pharmaceutical: Lovenox 8/25- needs for a minimum of 3 months from injury 8/28- will have wife taught how to use lovenox- ordered lovenox teaching kit for staff  8/31- pt's wife was educated.              -antiplatelet therapy: Continue Aspirin 81 mg daily and Plavix 75 mg day x3 weeks and aspirin alone 3. Pain: Tylenol   - Lidoderm patches 8pm to 8am -3 of them -kpad for shoulder pain, encouraged use of tylenol 8/30-  pain controlled- con't regimen 4. Mood/depression/insomnia: Provide emotional support  Celexa 10 mg daily started on 8/12  8/27- will increase Celexa to 20 mg daily-   -melatonin for sleep  -neuropsych intervention would be helpful for him             -antipsychotic agents: N/A 5. Neuropsych: This patient is capable of making decisions on his own behalf. 6. Skin/Wound Care: Routine skin checks 7. Fluids/Electrolytes/Nutrition: Routine in and outs 8.  Thrombocytopenia.  Resolved 9.  Neurogenic bowel bladder.  Foley catheter tube has been  placed for multiple in and out catheterization Lidocaine jelly for bowel program.  8/15- con't Foley due to enlarged scrotum- would be hard to cath- likely won't void-  - bowel program- with suppository/dig stim prn per wife's request 8/23- bowel program last night- good results- still has foley due to enlarged scrotum, but will d/w pt/family more about chronic foley? 8/28- d/w wife/pt- they want to go with chronic foley since will make the stress less-  8/31- having Bms pretty much daily with bowel program 10.  Elevated PSA/enlarged prostate.  Family currently not committed to oncology work-up.  Family has asked [place] to not discuss cancer possibility while here   22. Scrotal swelling- will elevate and Hospitalist is checking pro-BNP and giving a dose of Lasix.   8/25- resolved Will monitor/follow.  13. Dysphagia- on D3 thin diet- will con't SLP for swallowing therapy.  14. Resp issues-  Flutter valve- also con't ICS if desired.  15. Leukocytosis-UTI Completed macrobid 16.  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 Spasticity  8/24- pt doesn't want to increase spasticity meds as of yet.  19. Orthostatic hypotension  8/26- asked pt to push fluids- also spoke to head of therapy to get weighted cup and long straw so pt can drink without calling staff. Will also insist on TEDs- wait on abd binder? 20. Blanchable skin on sacrum  8/26- needs to be turned q2 hours when in bed- and foam dressing placed.  21. Nasal congestion  8/27- not clear if underlying or due to AD- but pt wants ot wait on Flonase- will con't nasal sline.  22. Access to water  8/28- d/w another SCI program- they suggest actually using O2 tubing and attaching to sip and puff and putting into weighted cup- LOWER than sip and puff so doesn't backflow onto pt- will try and arrange  8/30- is working!  LOS: 20 days A FACE TO FACE EVALUATION WAS PERFORMED  Caylen Yardley 01/02/2021, 6:44 PM

## 2021-01-02 NOTE — Progress Notes (Signed)
Bowel program started at 1847. Patient refused dig stem and disimpact.Patient tolerated well. Night nurse notified. Doree Fudge, LPN

## 2021-01-02 NOTE — Progress Notes (Signed)
Physical Therapy Session Note  Patient Details  Name: [name] MRN: [id number] Date of Birth: 01-27-1946  Today's Date: 01/02/2021 PT Individual Time: 1300-1410; 1600-1700 PT Individual Time Calculation (min): 70 min and 60 min  Short Term Goals: Week 3:  PT Short Term Goal 1 (Week 3): =LTG due to ELOS  Skilled Therapeutic Interventions/Progress Updates:    Session 1: Pt received seated in w/c in room, agreeable to PT session. No complaints of pain. PWC mobility to/from therapy gym at [facility] level with assist needed to place UE on controller. Pt able to safely park w/c next to mat table for transfer. Seated BP in w/c 111/71 with thigh-high TEDs and abdominal binder. Slide board transfer w/c to/from mat table with total A x 2. Seated balance EOM with mod to max A performing alt UE reaching outside BOS. Pt reports onset of "wooziness" while seated EOM. Seated BP 82/56. Sit to supine total A x 2. ACE wrapped BLE in supine for BP management. Symptoms resolve in this position. Returned to sitting EOM. Seated BP 128/73 with abdominal binder, TEDs, ACE wrap. Seated balance performing reaching outside BOS with alt UE. Seated scap squeezes x 10 reps. Pt again has onset of "wooziness", placed in semi-reclined position. Semi-reclined BP 98/60 and "wooziness" continues to increase. Slide board transfer back to w/c with total A x 2. Pt able to recline to a comfortable position once in PWC. Seated BP 124/73 and symptoms have resolved. PWC mobility x 200 ft with use of RUE, onset of fatigue towards end of propulsion. Pt left semi-reclined in PWC in room with needs in reach at end of session.  Session 2: Pt received seated in PWC in room, agreeable to PT session. No complaints of pain. Assessed BUE strength and posture for seating evaluation while patient seated in chair. Slide board transfer w/c to bed total A x 2. Sit to supine total A x 2. Assessed BLE strength and ROM for seating evaluation while pt supine in  bed. Performed PROM to B hips, knees, and ankles in available planes of motion. Pt exhibits increased tightness in LLE as compared to RLE. Encouraged patient to have family perform PROM per handout when visiting to prevent contractures. Pt continues to exhibit increased return of muscle strength in RLE in hips, knees and ankles in trace amounts, minimal return noted in LLE. Pt left supine in bed with needs in reach, bed alarm in place at end of session.  Therapy Documentation Precautions:  Precautions Precautions: Back Precaution Comments: pathologic thoracic fracture- back precautions for safety Restrictions Weight Bearing Restrictions: No     Therapy/Group: Individual Therapy  Peter Congo, PT, DPT, CSRS  01/02/2021, 5:10 PM

## 2021-01-02 NOTE — Progress Notes (Signed)
Occupational Therapy Session Note  Patient Details  Name: [name] MRN: [id number] Date of Birth: November 03, 1945  Today's Date: 01/02/2021 OT Individual Time: 1962-2297 OT Individual Time Calculation (min): 73 min    Short Term Goals: Week 3:  OT Short Term Goal 1 (Week 3): Pt will assist in UB bathing with AE as needed to complete UB bathing with mod assist OT Short Term Goal 2 (Week 3): Pt will engage in rolling with max assist of one caregiver to decrease burden of care with toileting OT Short Term Goal 3 (Week 3): Pt will maintain static sitting balance 2 mins with min assist in preparation for self-care tasks.  Skilled Therapeutic Interventions/Progress Updates:    OT intervention with focus bed mobility, sitting balance, SB transfers, and activity tolerance. Rolling R/L in bed with tot A+2 to facilitate changing brief, donning pants, and placing abdominal binder. Pt dependent for donning pants at bed level. Pt tot A for doffing pullover shirt seated in bed with HOB elevated. Pt able to removed from UE after pulled distally to elbows. Pt able to thread BUE into sleeves after setup. Pt requires more then reasonable amount of time to complete task. Dependent for pulling over elbows, over head, and over trunk. Pt with no s/s of hypotension seated with HOB elevated. BP supine in bed 123/69, HR 69. SB transfer to w/c with tot A+2. Pt c/o "lightheadedness" seated in w/c. BP 82/48, HR 69. W/c placed in tilt to relieve s/s. Pt partially returned to upright and BP 120/67. Pt reports no s/s. Pt remained in w/c with all needs within reach. Pt able to activate sip n puff call bell and access adaptive straw for water. RN notified of BP and pt's current status.   Therapy Documentation Precautions:  Precautions Precautions: Back Precaution Comments: pathologic thoracic fracture- back precautions for safety Restrictions Weight Bearing Restrictions: No   Pain: Pain Assessment Pain Scale: 0-10 Pain  Score: 0-No pain   Therapy/Group: Individual Therapy  Rich Brave 01/02/2021, 10:47 AM

## 2021-01-02 NOTE — Progress Notes (Signed)
Pt noted with 2 medium bowel movements post suppository and dig stim x1. No remaining stool noted in rectum.

## 2021-01-03 NOTE — Progress Notes (Signed)
Upon reassessment of bowel program, pt noted without bowel movement. Dig stim performed x1 with immediate return of soft medium stool.

## 2021-01-03 NOTE — Psychosocial Assessment (Signed)
Patient slept well throughout the night. Turned and repositioned throughout the night. No further complaints of pain or spasms at this time. Vital signs stable.

## 2021-01-03 NOTE — Progress Notes (Signed)
Occupational Therapy Session Note  Patient Details  Name: Khanh Tanori MRN: [id number] Date of Birth: [birth year]  Today's Date: 01/03/2021 OT Co-Treatment Time: 1015-1040 OT Co-Treatment Time Calculation (min): 25 min Co tx with PT total time 40 mins   Short Term Goals: Week 4:  OT Short Term Goal 1 (Week 4): STG=LTG 2/2 ELOS  Skilled Therapeutic Interventions/Progress Updates:    Co-tx with PT for family conference with Dr. Berline Chough and Jackie Plum (CSW) present for conference call with pt's wife, son, and daughter. Discussed equipment recommendations and daily routine. Discussed importance of family education/training and set up education sessions for next week. Pt remained in w/c with all needs within reach and CSW present.   Therapy Documentation Precautions:  Precautions Precautions: Back Precaution Comments: pathologic thoracic fracture- back precautions for safety Restrictions Weight Bearing Restrictions: No Pain: Pain Assessment Pain Scale: 0-10 Pain Score: 0-No pain    Therapy/Group: Co-Treatment  Rich Brave 01/03/2021, 10:53 AM

## 2021-01-03 NOTE — Progress Notes (Signed)
Patient ID: [name], male   DOB: 1945-09-09, 74 y.o.   MRN: [id number]  SW sent tentative incontinence and urology referral to [facility] and asked for follow-up with his wife.   Ambulance transportation scheduled for [facility] [phone]) at 10am on 9/8. SW encouraged to follow-up the day before to make sure referral is in place.   SW spoke with Sarah[facility] who intends to ask Barbara Cower to come by and see pt to determine if pt would like power w/c. SW informed today it was determined the family would be willing to consider, however, would like to know pricing of power w/c. Possibly he will see patient tomorrow to determine.   Cecile Sheerer, MSW, LCSWA Office: [phone] Cell: [phone] Fax: [phone]

## 2021-01-03 NOTE — Progress Notes (Signed)
Occupational Therapy Session Note  Patient Details  Name: [name] MRN: [id number] Date of Birth: 1945/05/13  Today's Date: 01/03/2021 OT Individual Time: 1300-1345 OT Individual Time Calculation (min): 45 min    Short Term Goals: Week 1:  OT Short Term Goal 1 (Week 1): Pt will maintain static sitting balance 2 mins with min assist in preparation for self-care tasks. OT Short Term Goal 1 - Progress (Week 1): Progressing toward goal OT Short Term Goal 2 (Week 1): Pt will assist in UB bathing with AE as needed to complete UB bathing with mod assist OT Short Term Goal 2 - Progress (Week 1): Progressing toward goal OT Short Term Goal 3 (Week 1): Pt will engage in rolling with max assist of one caregiver to decrease burden of care with toileting OT Short Term Goal 3 - Progress (Week 1): Progressing toward goal  Skilled Therapeutic Interventions/Progress Updates:    Pt received in bed agreeable to OT with no pain reported. Pt agreeable to training with stylus. No grip present, therefore utilized U cuff. Trialed propping elbow on towel to decrease degrees of freedom and shoulder demands. Pt able to use stylus to practice typing into youtube, google searching passages of favorite [place], copy/pasting a document, and navigating the home button. Pt would benefit from touch screen home button v pushing manual button and alerted pt to that option. Provided handout on stylus and how to order online. Exited session with pt seated in PWC, exit alarm on and call light in reach.   Therapy Documentation Precautions:  Precautions Precautions: Back Precaution Comments: pathologic thoracic fracture- back precautions for safety Restrictions Weight Bearing Restrictions: No General:   Vital Signs: Therapy Vitals Temp: 98.4 F (36.9 C) Temp Source: Oral Pulse Rate: 65 Resp: 17 BP: 130/73 Patient Position (if appropriate): Lying Oxygen Therapy SpO2: 98 % O2 Device: Room Air Pain:     Therapy/Group: Individual Therapy  Shon Hale 01/03/2021, 6:56 AM

## 2021-01-03 NOTE — Progress Notes (Signed)
PROGRESS NOTE   Subjective/Complaints:  Pt reports had wooziness yesterday with therapy- better with TEDs/ and ACE wraps- wants to wait on adding Midodrine for low BP.   Was late afternoon yesterday when it occurred.     ROS:   Pt denies SOB, abd pain, CP, N/V/C/D, and vision changes     Objective:   No results found. No results for input(s): WBC, HGB, HCT, PLT in the last 72 hours.   No results for input(s): NA, K, CL, CO2, GLUCOSE, BUN, CREATININE, CALCIUM in the last 72 hours.    Intake/Output Summary (Last 24 hours) at 01/03/2021 1029 Last data filed at 01/03/2021 0700 Gross per 24 hour  Intake 720 ml  Output 1350 ml  Net -630 ml        Physical Exam: Vital Signs Blood pressure 130/73, pulse 65, temperature 98.4 F (36.9 C), temperature source Oral, resp. rate 17, height 5' 8.5" (1.74 m), weight 73.2 kg, SpO2 98 %.      General: awake, alert, appropriate, laying on L side in bed; has sip and puff and water access;  NAD HENT: conjugate gaze; oropharynx moist CV: regular rate; no JVD Pulmonary: CTA B/L; no W/R/R- good air movement GI: soft, NT, ND, (+)BS Psychiatric: appropriate; quiet Neurological: Ox3 MAS of 1+-no change, but no spasms with ROM GU: scrotal swelling resolved- foley in place Motor- lacking ~ 5 degrees of L elbow extension RUE: biceps 4/5, WE 3-/5, triceps 2-/5, grip 2-/5, finger abd 0/5, unchanged LUE: biceps 4/5, WE 3-/5, triceps 0/5, grip 1/5, and finger abd 0/5,stable LE's 0/5 in HF, KE, DF, APF except NEW movement of R foot- can wiggle toes- HF 1/5, KE, 0/5, DF 0/5 and PF 1/5- today-  had a little questionable movement of LLE at knee - had definitely at R knee- no change today   Assessment/Plan: 1. Functional deficits which require 3+ hours per day of interdisciplinary therapy in a comprehensive inpatient rehab setting. Physiatrist is providing close team supervision and 24 hour  management of active medical problems listed below. Physiatrist and rehab team continue to assess barriers to discharge/monitor patient progress toward functional and medical goals  Care Tool:  Bathing        Body parts bathed by helper: Right arm, Left arm, Chest, Abdomen, Front perineal area, Buttocks, Right upper leg, Left upper leg, Right lower leg, Left lower leg, Face     Bathing assist Assist Level: 2 Helpers     Upper Body Dressing/Undressing Upper body dressing   What is the patient wearing?: Pull over shirt    Upper body assist Assist Level: Total Assistance - Patient < 25%    Lower Body Dressing/Undressing Lower body dressing      What is the patient wearing?: Pants     Lower body assist Assist for lower body dressing: 2 Helpers     Toileting Toileting    Toileting assist Assist for toileting: 2 Helpers     Transfers Chair/bed transfer  Transfers assist  Chair/bed transfer activity did not occur: Safety/medical concerns  Chair/bed transfer assist level: 2 Helpers     Locomotion Ambulation   Ambulation assist   Ambulation activity did  not occur: Safety/medical concerns          Walk 10 feet activity   Assist  Walk 10 feet activity did not occur: Safety/medical concerns        Walk 50 feet activity   Assist Walk 50 feet with 2 turns activity did not occur: Safety/medical concerns         Walk 150 feet activity   Assist Walk 150 feet activity did not occur: Safety/medical concerns         Walk 10 feet on uneven surface  activity   Assist Walk 10 feet on uneven surfaces activity did not occur: Safety/medical concerns         Wheelchair     Assist Is the patient using a wheelchair?: Yes Type of Wheelchair: Power Wheelchair activity did not occur: Safety/medical concerns (limited tolerance for upright)  Wheelchair assist level: Contact Guard/Touching assist Max wheelchair distance: 150'    Wheelchair 50  feet with 2 turns activity    Assist    Wheelchair 50 feet with 2 turns activity did not occur: Safety/medical concerns   Assist Level: Contact Guard/Touching assist   Wheelchair 150 feet activity     Assist  Wheelchair 150 feet activity did not occur: Safety/medical concerns   Assist Level: Contact Guard/Touching assist   Blood pressure 130/73, pulse 65, temperature 98.4 F (36.9 C), temperature source Oral, resp. rate 17, height 5' 8.5" (1.74 m), weight 73.2 kg, SpO2 98 %.  Medical Problem List and Plan: 1.  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 Pt also with lesion involving T1 vertebral body and right side posterior elements with associated pathologic vertebral fracture concerning for metastatic disease (?prostate) or other neoplasm. 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  Status post plan loop recorder  -turn q2-3 hours to reduce risk of pressure ulcers.  --con't PT and OT/CIR- and educated on autonomic dysreflexia/AD- today- that nasal congestion is sign of AD esp isolated or with Headache, anxiety, sense of impending doom.  Continue PT and OT/CIR- wife agreed to family conference at [place] Thursday -con't PT and OT_ family meeting tomorrow  -con't CIR/ PT and OT- family meeting done today- also went over prognosis- I.e. 75% will be back of what HE gets back by 6 months- won't get more return after 180 months.  2.  Impaired mobility: -DVT/anticoagulation: Venous Doppler studies negative.  Continue Lovenox Pharm[employer]: Lovenox 8/25- needs for a minimum of 3 months from injury 8/28- will have wife taught how to use lovenox- ordered lovenox teaching kit for staff  8/31- pt's wife was educated.   9/1- pt's wife informed will need to do Lovenox x 3 months total- and no PO meds to replace it.              -antiplatelet therapy: Continue Aspirin 81 mg daily and Plavix 75 mg day x3  weeks and aspirin alone 3. Pain: Tylenol   - Lidoderm patches 8pm to 8am -3 of them -kpad for shoulder pain, encouraged use of tylenol 8/30- pain controlled- con't regimen 4. Mood/depression/insomnia: Provide emotional support  Celexa 10 mg daily started on 8/12  8/27- will increase Celexa to 20 mg daily-   -melatonin for sleep  -neuropsych intervention would be helpful for him             -antipsychotic agents: N/A 5. Neuropsych: This patient is capable of making decisions on his own behalf. 6. Skin/Wound Care: Routine skin checks  7. Fluids/Electrolytes/Nutrition: Routine in and outs 8.  Thrombocytopenia.  Resolved 9.  Neurogenic bowel bladder.  Foley catheter tube has been placed for multiple in and out catheterization Lidocaine jelly for bowel program.  8/15- con't Foley due to enlarged scrotum- would be hard to cath- likely won't void-  - bowel program- with suppository/dig stim prn per wife's request 8/23- bowel program last night- good results- still has foley due to enlarged scrotum, but will d/w pt/family more about chronic foley? 8/28- d/w wife/pt- they want to go with chronic foley since will make the stress less-  9/1- nightly BM with bowel program- con't regimen- educated wife that will need to do at home- don't know when/if will be able to stop.  10.  Elevated PSA/enlarged prostate.  Family currently not committed to oncology work-up.  Family has asked [place] to not discuss cancer possibility while here   54. Scrotal swelling- will elevate and Hospitalist is checking pro-BNP and giving a dose of Lasix.   8/25- resolved Will monitor/follow.  13. Dysphagia- on D3 thin diet- will con't SLP for swallowing therapy.  14. Resp issues-  Flutter valve- also con't ICS if desired.  15. Leukocytosis-UTI Completed macrobid 16.  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 Spasticity  8/24- pt doesn't want to increase spasticity meds as of yet.  19. Orthostatic hypotension  8/26- asked pt to push fluids- also spoke to head of therapy to get weighted cup and long straw so pt can drink without calling staff. Will also insist on TEDs- wait on abd binder? 20. Blanchable skin on sacrum  8/26- needs to be turned q2 hours when in bed- and foam dressing placed.   9/1- starting TEDs and ACE wraps- wait on meds 21. Nasal congestion  8/27- not clear if underlying or due to AD- but pt wants ot wait on Flonase- will con't nasal sline.  22. Access to water  8/28- d/w another SCI program- they suggest actually using O2 tubing and attaching to sip and puff and putting into weighted cup- LOWER than sip and puff so doesn't backflow onto pt- will try and arrange  8/30- is working!   I spent a total of 43 minutes on total care- >50% coordination of care- 30 minutes just on family conference- and rest d/w team about overall prognosis and needs.    LOS: 21 days A FACE TO FACE EVALUATION WAS PERFORMED  Yadira Hada 01/03/2021, 10:29 AM

## 2021-01-03 NOTE — Progress Notes (Signed)
Bowel program started at 1859. Dig stem and disimpaction implemented. No bowel returned. Patient tolerated well. Doree Fudge, LPN

## 2021-01-03 NOTE — Progress Notes (Signed)
Occupational Therapy Session Note  Patient Details  Name: [name] MRN: [id number] Date of Birth: April 23, 1946  Today's Date: 01/03/2021 OT Individual Time: 0850-1000 OT Individual Time Calculation (min): 70 min    Short Term Goals: Week 4:  OT Short Term Goal 1 (Week 4): STG=LTG 2/2 ELOS  Skilled Therapeutic Interventions/Progress Updates:    OT intervention with focus on bed mobility, sitting balance, SB transfers, directing caregivers, education, and activity tolerance. Rolling in bed with tot A+2 to facilitate donning pants and abdominal minder. Supunie>sit EOB with tot A+2. SB transfer to w/c with tot A+2. Ted hose, BLE ace wraps, and abdominal binder donned at bed level. BP: supine-122/68; seated in w/c-97/56 and symptomatice, pt tilted back in PWC-109/65, pt with increased forward tilt-119/64. Symptoms dissipated. HR remained in 61-64 range. All needs within reach and ready for family conference in room.   Therapy Documentation Precautions:  Precautions Precautions: Back Precaution Comments: pathologic thoracic fracture- back precautions for safety Restrictions Weight Bearing Restrictions: No   Pain: Pain Assessment Pain Scale: 0-10 Pain Score: 0-No pain   Therapy/Group: Individual Therapy  Rich Brave 01/03/2021, 10:51 AM

## 2021-01-03 NOTE — Progress Notes (Signed)
Patient ID: [name], male   DOB: 12/30/1945, 75 y.o.   MRN: [id number]  Patient/Family Conference  Patient/family in attendance: patient, patient wife [name] and children via conference call.   Staff in attendance: Dr. Berline Chough, Lavone Neri (OT), Bernie Covey (PT), and Cecile Sheerer (SW)  Main focus: discuss plan of care for discharge  Synopsis of information shared: Family will need to prepare for 12-10 months for patient to have some return back in which he can physically do more for himself, however not guaranteed. Family encouraged to purchase power w/c as this is the best way to allow patient to have a sense of independence. Further catheter and Lovenox education needed as pt will d/c with both. Lovenox injections for 3 months from date of surgery. Discussed the importance of bowel program, and benefits of dig stem. Further family education needed to ensure family is comfortable with providing care to patient at home. Referral for future transportation services. DME recommendations- hoyer lift, hospital bed, DABSC, loaner w/c from [employer] discussed. Pt will require ambulance transport home. HHA pending HHA preference.   Barriers/concerns expressed by patient and family: Family would like to know the cost of loaner w/c. Advised this will be discussed with the vendor who will call to discuss further. Also concerned about how to manage the catheter and Lovenox injections. Informed this will be taught during family education, and/or when she is here nursing can educate.   Patient/family response: Pt wife understands. Pt wife stated that if pt requires too much care, they will consider relocation to [place]. SW discussed process for establishing new PCP and getting recommendation for HHA for therapies will be needed as orders are not interstate. Request for incontinence supplies. SW shared that this is a Development worker, community for items. Family edu scheduled for Monday 9am-12pm; Tues 1pm-3pm and Wed  9am-12pm.  Follow-up/action plans: SW to establish HHA once aware of preference, work on ordering DME, and arrange ambulance transport home.

## 2021-01-03 NOTE — Progress Notes (Signed)
Bowel program complete, patient had 1 liquid stool, small amount and 1 formed soft stool medium size. No other concerns to report.

## 2021-01-03 NOTE — Progress Notes (Signed)
Patient wife present tonight. Family teaching was reinforced for administering Lovenox, catheter care and emptying catheter. Patient wife demonstrated all three task. Doree Fudge, LPN

## 2021-01-03 NOTE — Progress Notes (Signed)
Pt reports increased frequency of episodes of muscle spasms. Relieved by repositioning of patient and adjustment of restrictive equipment and devices such as splints, foley tubing and brief, as well as PRAFO boots.

## 2021-01-03 NOTE — Progress Notes (Signed)
Occupational Therapy Weekly Progress Note  Patient Details  Name: [name] MRN: [id number] Date of Birth: 09-Aug-1945  Beginning of progress report period: December 27, 2020 End of progress report period: January 06, 2021   Patient has met 1 of 3 short term goals.  Pt continues to make slow progress with BADLs, sitting balance, and SB transfers. Pt with improved BUE function (R>L) with UB dressing tasks and oral care. Pt uses u-cuff to hold toothbrush during oral care with minimal support provided at elbow of RUE. Pt able to initiate brushing teeth but dependent for thoroughness. Pt is able to doff shirt sleeves after removed over head and past elbows. Pt initiates threading shirt sleeves over hands but requires assistance to complete task of donning pull over shirt. Pt requires tot A+2 for all bed mobility and SB transfers. Family conference with wife, daughter, and son completed 9/1 with Dr. Dagoberto Ligas present along with CSW, PT, and OTA.  Patient continues to demonstrate the following deficits: muscle weakness, muscle joint tightness, and muscle paralysis, decreased cardiorespiratoy endurance, abnormal tone, unbalanced muscle activation, and decreased coordination, and decreased sitting balance, decreased postural control, and decreased balance strategies and therefore will continue to benefit from skilled OT intervention to enhance overall performance with BADL.  Patient progressing toward long term goals..  Continue plan of care.  OT Short Term Goals Week 3:  OT Short Term Goal 1 (Week 3): Pt will assist in UB bathing with AE as needed to complete UB bathing with mod assist OT Short Term Goal 1 - Progress (Week 3): Met OT Short Term Goal 2 (Week 3): Pt will engage in rolling with max assist of one caregiver to decrease burden of care with toileting OT Short Term Goal 2 - Progress (Week 3): Progressing toward goal OT Short Term Goal 3 (Week 3): Pt will maintain static sitting balance 2 mins  with min assist in preparation for self-care tasks. OT Short Term Goal 3 - Progress (Week 3): Progressing toward goal Week 4:  OT Short Term Goal 1 (Week 4): STG=LTG 2/2 ELOS     [name] [facility] 01/03/2021, 6:31 AM

## 2021-01-03 NOTE — Progress Notes (Signed)
Occupational Therapy Session Note  Patient Details  Name: [name] MRN: [id number] Date of Birth: 10-15-1945  Today's Date: 01/04/2021 OT Individual Time: 1135-1204 OT Individual Time Calculation (min): 29 min   Skilled Therapeutic Interventions/Progress Updates:    Pt greeted in the PWC, denied pain. Tilted slightly back for pressure relief. Pt completed AAROM and gentle push/pull strengthening exercises for B UEs to increase functional abilities during self care tasks. Noted pt exhibits greater strength on the Rt vs Lt side. Gentle stretching completed to wrist/hand/forearm supinators. Encouraged pt to have family assist him with UE stretching to decrease edema. Pt remained sitting up at close of session with sip/puff call bell in place.   Therapy Documentation Precautions:  Precautions Precautions: Back Precaution Comments: pathologic thoracic fracture- back precautions for safety Restrictions Weight Bearing Restrictions: No  ADL: ADL Upper Body Bathing: Dependent (+2) Where Assessed-Upper Body Bathing: Edge of bed Lower Body Bathing: Dependent (+2) Where Assessed-Lower Body Bathing: Bed level Upper Body Dressing: Dependent (+2) Where Assessed-Upper Body Dressing: Edge of bed Lower Body Dressing: Dependent (+2) Where Assessed-Lower Body Dressing: Bed level Toileting: Dependent (+2) Where Assessed-Toileting: Bed level Toilet Transfer: Not assessed Tub/Shower Transfer: Not assessed     Therapy/Group: Individual Therapy  Livio Ledwith [facility] 01/04/2021, 12:43 PM

## 2021-01-03 NOTE — Progress Notes (Signed)
Physical Therapy Session Note  Patient Details  Name: [name] MRN: [id number] Date of Birth: 09-05-1945  Today's Date: 01/03/2021 PT Individual Time: 1000-1015, 1445-1540 PT Individual Time Calculation (min): 15 min, 55 min   Short Term Goals: Week 1:  PT Short Term Goal 1 (Week 1): Pt will tolerate at least  1 hr periods OOB in wc PT Short Term Goal 1 - Progress (Week 1): Met PT Short Term Goal 2 (Week 1): Pt will verbalize understanding of importance of and recite instructed  pressure relief schedule PT Short Term Goal 2 - Progress (Week 1): Met PT Short Term Goal 3 (Week 1): Pt will tolerate 10 min sitting balance activity prior to requiring supported rest breaks. PT Short Term Goal 3 - Progress (Week 1): Progressing toward goal PT Short Term Goal 4 (Week 1): Pt will participate in rolling via UEs and cervical motion. PT Short Term Goal 4 - Progress (Week 1): Met PT Short Term Goal 5 (Week 1): Pt will verbalize importance of rolling x 2 hrs to prevent pressure injuries  when in bed. PT Short Term Goal 5 - Progress (Week 1): Met Week 2:  PT Short Term Goal 1 (Week 2): Patient will initiate power wheelchair mobility. PT Short Term Goal 1 - Progress (Week 2): Met PT Short Term Goal 2 (Week 2): Patient will direct slide board transfers 50% of tasks. PT Short Term Goal 2 - Progress (Week 2): Progressing toward goal PT Short Term Goal 3 (Week 2): Patient will tolerate EOB balance activites for 20 minutes. PT Short Term Goal 3 - Progress (Week 2): Progressing toward goal PT Short Term Goal 4 (Week 2): Patient will tolerate 10 minutes sitting balance activity prior to needing supported rest breaks. PT Short Term Goal 4 - Progress (Week 2): Progressing toward goal  Skilled Therapeutic Interventions/Progress Updates:    Session 1: Pt seated in [place] on arrival for family conference with MD, CSW, OT, and PT. Pt's wife and adult children on phone for conference. Therapist discussed pt's  current level of function, pressure relief needs, plans for family training, and recommendations for PWC. Pt's wife plans to be present for family training on Monday, Tuesday and Wednesday of next week. Therapist left session to allow CSW to complete family conference.   Session 2: Pt seated in [place] on arrival. Pt agreeable to session with no c/o pain. Pt drove [place] to day room with supervision and assist to change speed. Pt requested to stretch his UE, therapist provided passive biceps stretch with deep pressure to encourage muscle release. Pt then participated in therapeutic activity with music x 20 min, including the following: chest press, adduction squeeze (tapping facilitation to L adductor), ankle pumps (tapping facilitation to ant tib), cross body reaching. Pt then drove chair back to room and performde slide board transfer with +2 assist. Pt remained in bed after session with sip and puff call bell in place and phone positioned for easy use.    Therapy Documentation Precautions:  Precautions Precautions: Back Precaution Comments: pathologic thoracic fracture- back precautions for safety Restrictions Weight Bearing Restrictions: No    Therapy/Group: Individual Therapy  [name] 01/03/2021, 10:51 AM

## 2021-01-04 NOTE — Progress Notes (Signed)
Bowel program started at 1852. Dig stem implemented. No bowel return. Patient tolerated well. Patient Placed on bedpan. Oncoming nurse notified. Doree Fudge, LPN

## 2021-01-04 NOTE — Progress Notes (Addendum)
Patient ID: [name], male   DOB: [birth year], 74 y.o.   MRN: [id number]  01/03/2021- SW emailed transportation referral [employer].  01/04/2021-SW received phone call from pt wife to discuss preferred H[facility]- 1) [facility] and 2) [facility]. SW informed will explore these agencies and will follow-up if they are able to accept. SW also shared Barbara Cower from [facility] will follow-up about power chair options and costs, DME will be ordered through [facility] and will get a mattress that will be covered  under insurance. SW also informed on [employer] will follow-up about incontinence supplies and one foley cathter per month.   SW faxed order for hospital bed, hoyer lift with sling, drop arm bedside commode, 30" slide board, and power wheelchair to [facility] ([phone]/f:[phone]).  SW faxed incontinence supplies/foley order to [employer] (p:[phone]/f:[phone]). SW confirmed with Jason/liasion received orders.   SW called [facility] ([phone]) to discuss referral. Referral declined due to staffing for RN, PT/OT, and no aides. SW sent HHPT/OT/aide/SW/SN referral to [facility] and waiting on follow-up. *referral accepted by [facility]. SOC to begin on 01/11/2021.  Cecile Sheerer, MSW, LCSWA Office: [phone] Cell: [phone] Fax: [phone]

## 2021-01-04 NOTE — Progress Notes (Signed)
Physical Therapy Session Note  Patient Details  Name: [name] MRN: [id number] Date of Birth: Apr 29, 1946  Today's Date: 01/04/2021 PT Individual Time: 6378-5885 PT Individual Time Calculation (min): 84 min   Short Term Goals: Week 3:  PT Short Term Goal 1 (Week 3): =LTG due to ELOS   Skilled Therapeutic Interventions/Progress Updates:   Pt received sitting in WC and agreeable to PT  Tilting for BP management. 121/76 when assessed in reclined position Asymptomatic.   Pt transported to rehab gym with [facility] propulsion as listed below Sitting balance to perform A/P and lateral weight shift with minimal use of BUE 2 x 8 in each direction prolonged therapeutic rest break between bouts.    WC mobility with Power chair 2 x 231f with no support to drive arm on the L side. Maxi move transfer to bed with +2. Rolling R and L with +2 to remove lift pad.  [facility]ed to [facility] for [facility].   Supine NMR For BLE hip Abduction/addition, knee flexion/extension quad/glute press on the RLE x 8. Hip/knee flexion/extention BLE x 8.   Pt left supine in bed with call bell in reach and all needs met.        Therapy Documentation Precautions:  Precautions Precautions: Back Precaution Comments: pathologic thoracic fracture- back precautions for safety Restrictions Weight Bearing Restrictions: No   Pain:   denies    Therapy/Group: Individual Therapy  ALorie Phenix9/06/2020, 6:34 PM

## 2021-01-04 NOTE — Progress Notes (Signed)
Occupational Therapy Session Note  Patient Details  Name: [name] MRN: [id number] Date of Birth: February 01, 1946  Today's Date: 01/04/2021 OT Individual Time: 5449-2010 OT Individual Time Calculation (min): 71 min    Short Term Goals: Week 4:  OT Short Term Goal 1 (Week 4): STG=LTG 2/2 ELOS  Skilled Therapeutic Interventions/Progress Updates:    OT intervention with focus on bed mobility, sitting balance, UB dressing tasks, BUE functional use, directing care, and safety awareness. Pt dependent for LB dressing tasks after ted hose and ace wraps donned. Pull over shirt doffed with tot A+2 with pt demonstrating ability to remove shirt sleeve over Bil hands seated with HOB elevated. Pt also able to initiate threading BUE into shirt sleeves past wrists. Dependent for competing task. Supine>sit EOB with tot A+2. SB tranfser to w/c with tot A+2. Pt able to tilt PWC using joy stick.  BP during session: supine-129/70; immediately after tranfser to w/c -99/55; after tilt in w/c-126/65. Pt reported minor s/s when BP 99/55 but resolved after tilting in w/c.  Pt remained in w/c with all needs within reach. Pt able to access water via straw and activate sip n puff call bell.   Therapy Documentation Precautions:  Precautions Precautions: Back Precaution Comments: pathologic thoracic fracture- back precautions for safety Restrictions Weight Bearing Restrictions: No  Pain: Pain Assessment Pain Scale: 0-10 Pain Score: 0-No pain   Therapy/Group: Individual Therapy  Rich Brave 01/04/2021, 10:50 AM

## 2021-01-04 NOTE — Progress Notes (Signed)
PROGRESS NOTE   Subjective/Complaints:  Pt reports he was scared chlorhexidine baths are causing wooziness in afternoon or tingling of arms and legs- I explained the tingling isn't clear- could be sensory improvement or beginning of nerve pain? Not clear.   Wife educated more on lovenox and foley last night.  Had results after bowel program- not during the first part- 2 BM's.   ROS:   Pt denies SOB, abd pain, CP, N/V/C/D, and vision changes   Objective:   No results found. No results for input(s): WBC, HGB, HCT, PLT in the last 72 hours.   No results for input(s): NA, K, CL, CO2, GLUCOSE, BUN, CREATININE, CALCIUM in the last 72 hours.    Intake/Output Summary (Last 24 hours) at 01/04/2021 0849 Last data filed at 01/04/2021 8768 Gross per 24 hour  Intake 720 ml  Output 1250 ml  Net -530 ml        Physical Exam: Vital Signs Blood pressure (!) 142/73, pulse 60, temperature 99 F (37.2 C), temperature source Oral, resp. rate 18, height 5' 8.5" (1.74 m), weight 73.2 kg, SpO2 97 %.       General: awake, alert, appropriate, sitting up slightly in bed; sip and puff and water available;  NAD HENT: conjugate gaze; oropharynx moist CV: regular rate; no JVD Pulmonary: CTA B/L; no W/R/R- good air movement GI: soft, NT, ND, (+)BS Psychiatric: appropriate; interactive ; quiet Neurological: Ox3  MAS of 1+-no change, but no spasms with ROM GU: scrotal swelling resolved- foley in place Motor- lacking ~ 5 degrees of L elbow extension RUE: biceps 4/5, WE 3-/5, triceps 2-/5, grip 2-/5, finger abd 0/5, unchanged LUE: biceps 4/5, WE 3-/5, triceps 0/5, grip 1/5, and finger abd 0/5,stable LE's 0/5 in HF, KE, DF, APF except NEW movement of R foot- can wiggle toes- HF 1/5, KE, 0/5, DF 0/5 and PF 1/5- today-  had a little questionable movement of LLE at knee - R KE/KF 1/5-   Assessment/Plan: 1. Functional deficits which require  3+ hours per day of interdisciplinary therapy in a comprehensive inpatient rehab setting. Physiatrist is providing close team supervision and 24 hour management of active medical problems listed below. Physiatrist and rehab team continue to assess barriers to discharge/monitor patient progress toward functional and medical goals  Care Tool:  Bathing        Body parts bathed by helper: Right arm, Left arm, Chest, Abdomen, Front perineal area, Buttocks, Right upper leg, Left upper leg, Right lower leg, Left lower leg, Face     Bathing assist Assist Level: 2 Helpers     Upper Body Dressing/Undressing Upper body dressing   What is the patient wearing?: Pull over shirt    Upper body assist Assist Level: Total Assistance - Patient < 25%    Lower Body Dressing/Undressing Lower body dressing      What is the patient wearing?: Pants     Lower body assist Assist for lower body dressing: 2 Helpers     Toileting Toileting    Toileting assist Assist for toileting: 2 Helpers     Transfers Chair/bed transfer  Transfers assist  Chair/bed transfer activity did not occur: Safety/medical concerns  Chair/bed transfer assist level: 2 Helpers     Locomotion Ambulation   Ambulation assist   Ambulation activity did not occur: Safety/medical concerns          Walk 10 feet activity   Assist  Walk 10 feet activity did not occur: Safety/medical concerns        Walk 50 feet activity   Assist Walk 50 feet with 2 turns activity did not occur: Safety/medical concerns         Walk 150 feet activity   Assist Walk 150 feet activity did not occur: Safety/medical concerns         Walk 10 feet on uneven surface  activity   Assist Walk 10 feet on uneven surfaces activity did not occur: Safety/medical concerns         Wheelchair     Assist Is the patient using a wheelchair?: Yes Type of Wheelchair: Power Wheelchair activity did not occur: Safety/medical  concerns (limited tolerance for upright)  Wheelchair assist level: Contact Guard/Touching assist Max wheelchair distance: 150'    Wheelchair 50 feet with 2 turns activity    Assist    Wheelchair 50 feet with 2 turns activity did not occur: Safety/medical concerns   Assist Level: Contact Guard/Touching assist   Wheelchair 150 feet activity     Assist  Wheelchair 150 feet activity did not occur: Safety/medical concerns   Assist Level: Contact Guard/Touching assist   Blood pressure (!) 142/73, pulse 60, temperature 99 F (37.2 C), temperature source Oral, resp. rate 18, height 5' 8.5" (1.74 m), weight 73.2 kg, SpO2 97 %.  Medical Problem List and Plan: 1.  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 Pt also with lesion involving T1 vertebral body and right side posterior elements with associated pathologic vertebral fracture concerning for metastatic disease (?prostate) or other neoplasm. 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  Status post plan loop recorder  -turn q2-3 hours to reduce risk of pressure ulcers.  --family meeting done today- also went over prognosis- I.e. 75% will be back of what HE gets back by 6 months- won't get more return after 18 months 9/2- con't PT and OT/CIR 2.  Impaired mobility: -DVT/anticoagulation: Venous Doppler studies negative.  Continue Lovenox Pharm[employer]: Lovenox  9/2- educated that needs lovenox x 3 months total.              -antiplatelet therapy: Continue Aspirin 81 mg daily and Plavix 75 mg day x3 weeks and aspirin alone 3. Pain: Tylenol   - Lidoderm patches 8pm to 8am -3 of them -kpad for shoulder pain, encouraged use of tylenol 9/2- pain controlled- con't regimen 4. Mood/depression/insomnia: Provide emotional support  Celexa 10 mg daily started on 8/12  8/27- will increase Celexa to 20 mg daily-   -melatonin for sleep  -neuropsych  intervention would be helpful for him             -antipsychotic agents: N/A 5. Neuropsych: This patient is capable of making decisions on his own behalf. 6. Skin/Wound Care: Routine skin checks 7. Fluids/Electrolytes/Nutrition: Routine in and outs 8.  Thrombocytopenia.  Resolved 9.  Neurogenic bowel bladder.  Foley catheter tube has been placed for multiple in and out catheterization Lidocaine jelly for bowel program.  8/15- con't Foley due to enlarged scrotum- would be hard to cath- likely won't void-  - bowel program- with suppository/dig stim prn per wife's request 8/23- bowel program last night- good results- still has  foley due to enlarged scrotum, but will d/w pt/family more about chronic foley? 8/28- d/w wife/pt- they want to go with chronic foley since will make the stress less-  9/1- nightly BM with bowel program- con't regimen- educated wife that will need to do at home- don't know when/if will be able to stop.  10.  Elevated PSA/enlarged prostate.  Family currently not committed to oncology work-up.  Family has asked [place] to not discuss cancer possibility while here   12. Scrotal swelling- will elevate and Hospitalist is checking pro-BNP and giving a dose of Lasix.   8/25- resolved Will monitor/follow.  13. Dysphagia- on D3 thin diet- will con't SLP for swallowing therapy.  14. Resp issues-  Flutter valve- also con't ICS if desired.  15. Leukocytosis-UTI Completed macrobid 16.  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  Spasticity  8/24- pt doesn't want to increase spasticity meds as of yet.  19. Orthostatic hypotension  8/26- asked pt to push fluids- also spoke to head of therapy to get weighted cup and long straw so pt can drink without calling staff. Will also insist on TEDs- wait on abd binder? 20. Blanchable skin on sacrum  8/26- needs to be turned q2 hours when in bed- and foam dressing placed.   9/1- starting TEDs and ACE wraps- wait on meds 21. Nasal congestion  8/27- not clear if underlying or due to AD- but pt wants ot wait on Flonase- will con't nasal sline.  22. Access to water  8/28- d/w another SCI program- they suggest actually using O2 tubing and attaching to sip and puff and putting into weighted cup- LOWER than sip and puff so doesn't backflow onto pt- will try and arrange  9/2- has access to water   Pt requires incontinence supplies due to neurogenic bowel/bowel incontinence as well as Neurogenic bladder and urinary retention with overflow incontinence- also needs Foley for urinary retention- - adult briefs, gloves and underpads/chux. Will need lifetime!   LOS: 22 days A FACE TO FACE EVALUATION WAS PERFORMED  Nayomi Tabron 01/04/2021, 8:49 AM

## 2021-01-05 NOTE — Progress Notes (Signed)
Occupational Therapy Session Note  Patient Details  Name: [name] MRN: [id number] Date of Birth: 01-25-1946  Today's Date: 01/06/2021 OT Individual Time: 1450-1530 OT Individual Time Calculation (min): 40 min  and Today's Date: 01/06/2021 OT Group Time: 1100-1200 OT Group Time Calculation: 60 min   Skilled Therapeutic Interventions/Progress Updates: Pt engaged in therapeutic w/c level dance group focusing on patient choice, UE/LE strengthening, salience, activity tolerance, and social participation. Pt was guided through various dance-based exercises involving UEs/LEs and trunk. All music was selected by group members. Emphasis placed on NMR of UEs and LEs with PWC in upright neutral position. Note that pt took fewer rest breaks during group than during last week. Exhibited more active and dynamic UE movement this week as well, did need mod facilitation at times for the Lt side. Pt was able to engage in bimanual activity while also activating LE musculature. Pt was very proud of this! He was returned to the room by OT at end of session.   2nd Session 1:1 tx () Pt greeted in the PWC with no c/o pain, motivated to work on UE strengthening/ROM. Used both a 1# (Lt UE ace wrapped to bar) and unweighted bar, guiding pt through B UE exercises given mod facilitation for the Lt UE and min for the Rt. Really worked on improving elbow extension bilaterally, assisted him with achieving end ranges for gentle stretching. Pt asking what he could do to help him with digit extension and strongly advised him to continue with stretching digits himself and/or with family assistance. Discussed benefits of stretching/ROM for edema mgt as well. Pt was left via PT handoff.       Therapy Documentation Precautions:  Precautions Precautions: Back Precaution Comments: pathologic thoracic fracture- back precautions for safety Restrictions Weight Bearing Restrictions: No  Pain: no c/o pain during tx sessions    ADL: ADL Upper Body Bathing: Dependent (+2) Where Assessed-Upper Body Bathing: Edge of bed Lower Body Bathing: Dependent (+2) Where Assessed-Lower Body Bathing: Bed level Upper Body Dressing: Dependent (+2) Where Assessed-Upper Body Dressing: Edge of bed Lower Body Dressing: Dependent (+2) Where Assessed-Lower Body Dressing: Bed level Toileting: Dependent (+2) Where Assessed-Toileting: Bed level Toilet Transfer: Not assessed Tub/Shower Transfer: Not assessed      Therapy/Group: Individual Therapy and Group Therapy  Athanasius Kesling A Takila Kronberg 01/06/2021, 4:08 PM

## 2021-01-06 NOTE — Progress Notes (Signed)
PROGRESS NOTE   Subjective/Complaints: Getting started in working with Ladona Ridgel He has no complaints, very pleasant Had a nice visit with his wife today Ladona Ridgel notes improved movement in right leg with some movement of left leg!  ROS:   Pt denies SOB, abd pain, CP, N/V/C/D, and vision changes   Objective:   No results found. No results for input(s): WBC, HGB, HCT, PLT in the last 72 hours.   No results for input(s): NA, K, CL, CO2, GLUCOSE, BUN, CREATININE, CALCIUM in the last 72 hours.    Intake/Output Summary (Last 24 hours) at 01/06/2021 1059 Last data filed at 01/06/2021 0745 Gross per 24 hour  Intake 360 ml  Output 1725 ml  Net -1365 ml        Physical Exam: Vital Signs Blood pressure 130/68, pulse 60, temperature 97.7 F (36.5 C), temperature source Oral, resp. rate 18, height 5' 8.5" (1.74 m), weight 73.2 kg, SpO2 97 %.  Gen: no distress, normal appearing HEENT: oral mucosa pink and moist, NCAT Cardio: Reg rate Chest: normal effort, normal rate of breathing Abd: soft, non-distended Ext: no edema Psych: pleasant, normal affect Neurological: Ox3  MAS of 1+-no change, but no spasms with ROM GU: scrotal swelling resolved- foley in place Motor- lacking ~ 5 degrees of L elbow extension RUE: biceps 4/5, WE 3-/5, triceps 2-/5, grip 2-/5, finger abd 0/5, unchanged LUE: biceps 4/5, WE 3-/5, triceps 0/5, grip 1/5, and finger abd 0/5,stable LE's 0/5 in HF, KE, DF, APF except NEW movement of R foot- can wiggle toes- HF 1/5, KE, 0/5, DF 0/5 and PF 1/5- today-  had a little questionable movement of LLE at knee - R KE/KF 1/5-   Assessment/Plan: 1. Functional deficits which require 3+ hours per day of interdisciplinary therapy in a comprehensive inpatient rehab setting. Physiatrist is providing close team supervision and 24 hour management of active medical problems listed below. Physiatrist and rehab team continue to  assess barriers to discharge/monitor patient progress toward functional and medical goals  Care Tool:  Bathing        Body parts bathed by helper: Right arm, Left arm, Chest, Abdomen, Front perineal area, Buttocks, Right upper leg, Left upper leg, Right lower leg, Left lower leg, Face     Bathing assist Assist Level: 2 Helpers     Upper Body Dressing/Undressing Upper body dressing   What is the patient wearing?: Pull over shirt    Upper body assist Assist Level: Total Assistance - Patient < 25%    Lower Body Dressing/Undressing Lower body dressing      What is the patient wearing?: Pants     Lower body assist Assist for lower body dressing: 2 Helpers     Toileting Toileting    Toileting assist Assist for toileting: 2 Helpers     Transfers Chair/bed transfer  Transfers assist  Chair/bed transfer activity did not occur: Safety/medical concerns  Chair/bed transfer assist level: 2 Helpers     Locomotion Ambulation   Ambulation assist   Ambulation activity did not occur: Safety/medical concerns          Walk 10 feet activity   Assist  Walk 10 feet activity  did not occur: Safety/medical concerns        Walk 50 feet activity   Assist Walk 50 feet with 2 turns activity did not occur: Safety/medical concerns         Walk 150 feet activity   Assist Walk 150 feet activity did not occur: Safety/medical concerns         Walk 10 feet on uneven surface  activity   Assist Walk 10 feet on uneven surfaces activity did not occur: Safety/medical concerns         Wheelchair     Assist Is the patient using a wheelchair?: Yes Type of Wheelchair: Power Wheelchair activity did not occur: Safety/medical concerns (limited tolerance for upright)  Wheelchair assist level: Contact Guard/Touching assist Max wheelchair distance: 150'    Wheelchair 50 feet with 2 turns activity    Assist    Wheelchair 50 feet with 2 turns activity did  not occur: Safety/medical concerns   Assist Level: Contact Guard/Touching assist   Wheelchair 150 feet activity     Assist  Wheelchair 150 feet activity did not occur: Safety/medical concerns   Assist Level: Contact Guard/Touching assist   Blood pressure 130/68, pulse 60, temperature 97.7 F (36.5 C), temperature source Oral, resp. rate 18, height 5' 8.5" (1.74 m), weight 73.2 kg, SpO2 97 %.  Medical Problem List and Plan: 1.  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 Pt also with lesion involving T1 vertebral body and right side posterior elements with associated pathologic vertebral fracture concerning for metastatic disease (?prostate) or other neoplasm. 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  Status post plan loop recorder  -turn q2-3 hours to reduce risk of pressure ulcers.  --family meeting done- also went over prognosis- I.e. 75% will be back of what HE gets back by 6 months- won't get more return after 18 months Continue CIR 2.  Impaired mobility: -DVT/anticoagulation: Venous Doppler studies negative.  Continue Lovenox Pharmaceutical: Lovenox  9/2- educated that needs lovenox x 3 months total.              -antiplatelet therapy: Continue Aspirin 81 mg daily and Plavix 75 mg day x3 weeks and aspirin alone 3. Pain: Tylenol   - Lidoderm patches 8pm to 8am -3 of them -kpad for shoulder pain, encouraged use of tylenol 9/4- pain controlled- continue regimen 4. Mood/depression/insomnia: Provide emotional support  Celexa 10 mg daily started on 8/12  8/27- will increase Celexa to 20 mg daily-   -continue melatonin for sleep  -neuropsych intervention would be helpful for him             -antipsychotic agents: N/A 5. Neuropsych: This patient is capable of making decisions on his own behalf. 6. Skin/Wound Care: Routine skin checks 7. Fluids/Electrolytes/Nutrition: Routine in and outs 8.   Thrombocytopenia.  Resolved 9.  Neurogenic bowel bladder.  Foley catheter tube has been placed for multiple in and out catheterization Lidocaine jelly for bowel program.  8/15- con't Foley due to enlarged scrotum- would be hard to cath- likely won't void-  - bowel program- with suppository/dig stim prn per wife's request 8/23- bowel program last night- good results- still has foley due to enlarged scrotum, but will d/w pt/family more about chronic foley? 8/28- d/w wife/pt- they want to go with chronic foley since will make the stress less-  9/1- nightly BM with bowel program- con't regimen- educated wife that will need to do at home- don't know when/if  will be able to stop.  10.  Elevated PSA/enlarged prostate.  Family currently not committed to oncology work-up.  Family has asked [place] to not discuss cancer possibility while here   12. Scrotal swelling- will elevate and Hospitalist is checking pro-BNP and giving a dose of Lasix.   8/25- resolved Will monitor/follow.  13. Dysphagia- on D3 thin diet- will con't SLP for swallowing therapy.  14. Resp issues-  Flutter valve- also con't ICS if desired.  15. Leukocytosis-UTI Completed macrobid 16.  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 Spasticity  8/24- pt doesn't want to increase spasticity meds as of yet.  19. Orthostatic hypotension  8/26- asked pt to push fluids- also spoke to head of therapy to get weighted cup and long straw so pt can drink without calling staff. Will also insist on TEDs- wait on  abd binder? 20. Blanchable skin on sacrum  8/26- needs to be turned q2 hours when in bed- and foam dressing placed.   9/1- starting TEDs and ACE wraps- wait on meds 21. Nasal congestion  8/27- not clear if underlying or due to AD- but pt wants ot wait on Flonase- will con't nasal sline.  22. Access to water  8/28- d/w another SCI program- they suggest actually using O2 tubing and attaching to sip and puff and putting into weighted cup- LOWER than sip and puff so doesn't backflow onto pt- will try and arrange  9/2- has access to water   Pt requires incontinence supplies due to neurogenic bowel/bowel incontinence as well as Neurogenic bladder and urinary retention with overflow incontinence- also needs Foley for urinary retention- - adult briefs, gloves and underpads/chux. Will need lifetime!   LOS: 24 days A FACE TO FACE EVALUATION WAS PERFORMED  Urie Loughner P Adamaris King 01/06/2021, 10:59 AM

## 2021-01-06 NOTE — Progress Notes (Signed)
Physical Therapy Session Note  Patient Details  Name: Philbert Ocallaghan MRN: [id number] Date of Birth: 1945-05-07  Today's Date: 01/06/2021 PT Individual Time: 0900-1010; 1540-1620 PT Individual Time Calculation (min): 70 min and 40 min  Short Term Goals: Week 3:  PT Short Term Goal 1 (Week 3): =LTG due to ELOS  Skilled Therapeutic Interventions/Progress Updates:    Session 1: Pt received semi-reclined in bed, agreeable to PT session. No complaints of pain this date. Semi-reclined BP 95/57, pt asymptomatic. Assisted pt with donning thigh-high TEDs, BLE ACE wrap, abdominal binder, and pants at bed level dependently with max A to roll R/L. Semi-reclined BP following donning of clothing items 122/64. Introduced Recruitment consultant lift this session as this is what patient will d/c home with. Rolling L/R with max A for placement of sling. Manual hoyer transfer to [facility]. Recommending two people be present for this transfer at home, pt understanding of recommendations. Once seated in PWC in upright position pt reports feeling "woozy",  6/10. Seated BP 89/55, pt transitioned to semi-reclined position. In this position symptoms improve, BP 100/61. Pt requests to recline further and symptoms resolve. BP 118/70. Pt agreeable to attempt sitting back up. Once in seated position BP 95/53 and pt rates wooziness as 3/10. Pt agreeable to attempt to remain seated more upright for improved upright tolerance but PWC left on so that pt can tilt himself back if symptoms increase. Pt left seated in PWC in room with needs in reach at end of session. Pt continues to require extended period of time to position comfortably in chair with needs in reach.  Session 2: Pt received seated in PWC in room, agreeable to PT session. No complaints of pain or wooziness. Slide board transfer w/c to bed with total A x 2. Sit to supine total A x 2. Rolling L/R with max A for repositioning. Remainder of session focus on BLE strengthening and ROM. RLE knee  to chest AAROM, quad set, SKFO, and ankle pumps x 15 reps each. LLE hip, knee, and ankle PROM in available planes of motion. Attempt to have patient perform bridge, glute contraction palpated (R>L) but pt unable to lift hips up from bed. Pt left seated in bed with PRAFOs in place, needs in reach.  Therapy Documentation Precautions:  Precautions Precautions: Back Precaution Comments: pathologic thoracic fracture- back precautions for safety Restrictions Weight Bearing Restrictions: No   Therapy/Group: Individual Therapy   Peter Congo, PT, DPT, CSRS  01/06/2021, 12:09 PM

## 2021-01-07 LAB — CBC WITH DIFFERENTIAL/PLATELET
Abs Immature Granulocytes: 0.01 10*3/uL (ref 0.00–0.07)
Basophils Absolute: 0 10*3/uL (ref 0.0–0.1)
Basophils Relative: 1 %
Eosinophils Absolute: 0.2 10*3/uL (ref 0.0–0.5)
Eosinophils Relative: 4 %
HCT: 36.4 % — ABNORMAL LOW (ref 39.0–52.0)
Hemoglobin: 12.1 g/dL — ABNORMAL LOW (ref 13.0–17.0)
Immature Granulocytes: 0 %
Lymphocytes Relative: 16 %
Lymphs Abs: 0.7 10*3/uL (ref 0.7–4.0)
MCH: 31.8 pg (ref 26.0–34.0)
MCHC: 33.2 g/dL (ref 30.0–36.0)
MCV: 95.8 fL (ref 80.0–100.0)
Monocytes Absolute: 0.6 10*3/uL (ref 0.1–1.0)
Monocytes Relative: 14 %
Neutro Abs: 2.7 10*3/uL (ref 1.7–7.7)
Neutrophils Relative %: 65 %
Platelets: 144 10*3/uL — ABNORMAL LOW (ref 150–400)
RBC: 3.8 MIL/uL — ABNORMAL LOW (ref 4.22–5.81)
RDW: 13.6 % (ref 11.5–15.5)
WBC: 4.1 10*3/uL (ref 4.0–10.5)
nRBC: 0 % (ref 0.0–0.2)

## 2021-01-07 LAB — BASIC METABOLIC PANEL
Anion gap: 12 (ref 5–15)
BUN: 12 mg/dL (ref 8–23)
CO2: 22 mmol/L (ref 22–32)
Calcium: 9.5 mg/dL (ref 8.9–10.3)
Chloride: 103 mmol/L (ref 98–111)
Creatinine, Ser: 0.52 mg/dL — ABNORMAL LOW (ref 0.61–1.24)
GFR, Estimated: >60 mL/min (ref >60)
Glucose, Bld: 101 mg/dL — ABNORMAL HIGH (ref 70–99)
Potassium: 4.3 mmol/L (ref 3.5–5.1)
Sodium: 137 mmol/L (ref 135–145)

## 2021-01-07 NOTE — Progress Notes (Signed)
PROGRESS NOTE   Subjective/Complaints:  Pt reports less wooziness- - was "5-6/10"down to 4/10- Bowel program going OK.  Drinking coconut water.  Sleeping well.    ROS:   Pt denies SOB, abd pain, CP, N/V/C/D, and vision changes   Objective:   No results found. Recent Labs    01/07/21 0516  WBC 4.1  HGB 12.1*  HCT 36.4*  PLT 144*     Recent Labs    01/07/21 0516  NA 137  K 4.3  CL 103  CO2 22  GLUCOSE 101*  BUN 12  CREATININE 0.52*  CALCIUM 9.5      Intake/Output Summary (Last 24 hours) at 01/07/2021 1019 Last data filed at 01/07/2021 0354 Gross per 24 hour  Intake 480 ml  Output 1200 ml  Net -720 ml        Physical Exam: Vital Signs Blood pressure (!) 167/72, pulse (!) 54, temperature 98.1 F (36.7 C), temperature source Oral, resp. rate 18, height 5' 8.5" (1.74 m), weight 73.2 kg, SpO2 98 %.   General: awake, alert, appropriate, sitting up in bed with help; sip and puff at bedside; NAD HENT: conjugate gaze; oropharynx moist CV: regular rate; no JVD Pulmonary: CTA B/L; no W/R/R- good air movement GI: soft, NT, ND, (+)BS Psychiatric: appropriate Neurological: Ox3 MAS of 1+-no change, but no spasms with ROM GU: scrotal swelling resolved- foley in place Motor- lacking ~ 5 degrees of L elbow extension RUE: biceps 4/5, WE 3-/5, triceps 2-/5, grip 2-/5, finger abd 0/5, unchanged LUE: biceps 4/5, WE 3-/5, triceps 0/5, grip 1/5, and finger abd 0/5,stable LE's 0/5 in HF, KE, DF, APF except NEW movement of R foot- can wiggle toes- HF 1/5, KE, 0/5, DF 0/5 and PF 1/5- today-  had a little questionable movement of LLE at knee - R KE/KF 1/5-   Assessment/Plan: 1. Functional deficits which require 3+ hours per day of interdisciplinary therapy in a comprehensive inpatient rehab setting. Physiatrist is providing close team supervision and 24 hour management of active medical problems listed  below. Physiatrist and rehab team continue to assess barriers to discharge/monitor patient progress toward functional and medical goals  Care Tool:  Bathing        Body parts bathed by helper: Right arm, Left arm, Chest, Abdomen, Front perineal area, Buttocks, Right upper leg, Left upper leg, Right lower leg, Left lower leg, Face     Bathing assist Assist Level: 2 Helpers     Upper Body Dressing/Undressing Upper body dressing   What is the patient wearing?: Pull over shirt    Upper body assist Assist Level: Total Assistance - Patient < 25%    Lower Body Dressing/Undressing Lower body dressing      What is the patient wearing?: Pants     Lower body assist Assist for lower body dressing: 2 Helpers     Toileting Toileting    Toileting assist Assist for toileting: 2 Helpers     Transfers Chair/bed transfer  Transfers assist  Chair/bed transfer activity did not occur: Safety/medical concerns  Chair/bed transfer assist level: Dependent - mechanical lift     Locomotion Ambulation   Ambulation assist   Ambulation  activity did not occur: Safety/medical concerns          Walk 10 feet activity   Assist  Walk 10 feet activity did not occur: Safety/medical concerns        Walk 50 feet activity   Assist Walk 50 feet with 2 turns activity did not occur: Safety/medical concerns         Walk 150 feet activity   Assist Walk 150 feet activity did not occur: Safety/medical concerns         Walk 10 feet on uneven surface  activity   Assist Walk 10 feet on uneven surfaces activity did not occur: Safety/medical concerns         Wheelchair     Assist Is the patient using a wheelchair?: Yes Type of Wheelchair: Power Wheelchair activity did not occur: Safety/medical concerns (limited tolerance for upright)  Wheelchair assist level: Contact Guard/Touching assist Max wheelchair distance: 150'    Wheelchair 50 feet with 2 turns  activity    Assist    Wheelchair 50 feet with 2 turns activity did not occur: Safety/medical concerns   Assist Level: Contact Guard/Touching assist   Wheelchair 150 feet activity     Assist  Wheelchair 150 feet activity did not occur: Safety/medical concerns   Assist Level: Contact Guard/Touching assist   Blood pressure (!) 167/72, pulse (!) 54, temperature 98.1 F (36.7 C), temperature source Oral, resp. rate 18, height 5' 8.5" (1.74 m), weight 73.2 kg, SpO2 98 %.  Medical Problem List and Plan: 1.  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 Pt also with lesion involving T1 vertebral body and right side posterior elements with associated pathologic vertebral fracture concerning for metastatic disease (?prostate) or other neoplasm. 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  Status post plan loop recorder  -turn q2-3 hours to reduce risk of pressure ulcers.  --family meeting done- also went over prognosis- I.e. 75% will be back of what HE gets back by 6 months- won't get more return after 18 months Con't PT and OT/CIR- getting movement in both legs- not antigravity so far.   2.  Impaired mobility: -DVT/anticoagulation: Venous Doppler studies negative.  Continue Lovenox Pharmaceutical: Lovenox  9/2- educated that needs lovenox x 3 months total.              -antiplatelet therapy: Continue Aspirin 81 mg daily and Plavix 75 mg day x3 weeks and aspirin alone 3. Pain: Tylenol   - Lidoderm patches 8pm to 8am -3 of them -kpad for shoulder pain, encouraged use of tylenol 9/4- pain controlled- continue regimen 4. Mood/depression/insomnia: Provide emotional support  Celexa 10 mg daily started on 8/12  8/27- will increase Celexa to 20 mg daily-   -continue melatonin for sleep  -neuropsych intervention would be helpful for him             -antipsychotic agents: N/A 5. Neuropsych: This patient is  capable of making decisions on his own behalf. 6. Skin/Wound Care: Routine skin checks 7. Fluids/Electrolytes/Nutrition: Routine in and outs 8.  Thrombocytopenia.  Resolved 9.  Neurogenic bowel bladder.  Foley catheter tube has been placed for multiple in and out catheterization Lidocaine jelly for bowel program.  8/15- con't Foley due to enlarged scrotum- would be hard to cath- likely won't void-  - bowel program- with suppository/dig stim prn per wife's request 8/23- bowel program last night- good results- still has foley due to enlarged scrotum, but will d/w  pt/family more about chronic foley? 8/28- d/w wife/pt- they want to go with chronic foley since will make the stress less-  9/1- nightly BM with bowel program- con't regimen- educated wife that will need to do at home- don't know when/if will be able to stop. 9/5- bowel program going well.   10.  Elevated PSA/enlarged prostate.  Family currently not committed to oncology work-up.  Family has asked [place] to not discuss cancer possibility while here   12. Scrotal swelling- will elevate and Hospitalist is checking pro-BNP and giving a dose of Lasix.   8/25- resolved Will monitor/follow.  13. Dysphagia- on D3 thin diet- will con't SLP for swallowing therapy.  14. Resp issues-  Flutter valve- also con't ICS if desired.  15. Leukocytosis-UTI Completed macrobid 16.  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  18. Spasticity  8/24- pt doesn't want to increase spasticity meds as of yet.  19. Orthostatic hypotension  8/26- asked pt to push fluids- also spoke to  head of therapy to get weighted cup and long straw so pt can drink without calling staff. Will also insist on TEDs- wait on abd binder? 20. Blanchable skin on sacrum  8/26- needs to be turned q2 hours when in bed- and foam dressing placed.   9/1- starting TEDs and ACE wraps- wait on meds 21. Nasal congestion  8/27- not clear if underlying or due to AD- but pt wants ot wait on Flonase- will con't nasal sline.  22. Access to water  8/28- d/w another SCI program- they suggest actually using O2 tubing and attaching to sip and puff and putting into weighted cup- LOWER than sip and puff so doesn't backflow onto pt- will try and arrange  9/2- has access to water   Pt requires incontinence supplies due to neurogenic bowel/bowel incontinence as well as Neurogenic bladder and urinary retention with overflow incontinence- also needs Foley for urinary retention- - adult briefs, gloves and underpads/chux. Will need lifetime!   LOS: 25 days A FACE TO FACE EVALUATION WAS PERFORMED  Maclane Holloran 01/07/2021, 10:19 AM

## 2021-01-07 NOTE — Plan of Care (Signed)
  Problem: RH Balance Goal: LTG: Patient will maintain dynamic sitting balance (OT) Description: LTG:  Patient will maintain dynamic sitting balance with assistance during activities of daily living (OT) Outcome: Not Applicable Flowsheets (Taken 01/07/2021 1144) LTG: Pt will maintain dynamic sitting balance during ADLs with: (d/c goal) -- Note: Pt requires total A for static sitting based on level of injury   Problem: RH Eating Goal: LTG Patient will perform eating w/assist, cues/equip (OT) Description: LTG: Patient will perform eating with assist, with/without cues using equipment (OT) Flowsheets (Taken 01/07/2021 1144) LTG: Pt will perform eating with assistance level of: Moderate Assistance - Patient 50 - 74% LTG: Pt will perform eating using equipment: (downgraded JLS) Universal cuff Note: Once setup pt able to assist with self feeding    Problem: RH Grooming Goal: LTG Patient will perform grooming w/assist,cues/equip (OT) Description: LTG: Patient will perform grooming with assist, with/without cues using equipment (OT) Flowsheets (Taken 01/07/2021 1144) LTG: Pt will perform grooming with assistance level of: (downgraded JLS) Maximal Assistance - Patient 25 - 49% Note: downgraded JLS   Problem: RH Bathing Goal: LTG Patient will bathe all body parts with assist levels (OT) Description: LTG: Patient will bathe all body parts with assist levels (OT) Flowsheets (Taken 01/07/2021 1144) LTG: Pt will perform bathing with assistance level/cueing: (downgraded JLS) Maximal Assistance - Patient 25 - 49% Note: Pt can help with UB bathing more than lower body   Problem: RH Dressing Goal: LTG Patient will perform upper body dressing (OT) Description: LTG Patient will perform upper body dressing with assist, with/without cues (OT). Flowsheets (Taken 01/07/2021 1144) LTG: Pt will perform upper body dressing with assistance level of: (downgraded JLS) Maximal Assistance - Patient 25 - 49% Note:  downgraded JLS Goal: LTG Patient will perform lower body dressing w/assist (OT) Description: LTG: Patient will perform lower body dressing with assist, with/without cues in positioning using equipment (OT) Outcome: Not Applicable Flowsheets (Taken 01/07/2021 1144) LTG: Pt will perform lower body dressing with assistance level of: (d.c goal) -- Note: D/c goal - pt requires total A at bed level    Problem: RH Toileting Goal: LTG Patient will perform toileting task (3/3 steps) with assistance level (OT) Description: LTG: Patient will perform toileting task (3/3 steps) with assistance level (OT)  Outcome: Not Met (add Reason) Flowsheets (Taken 01/07/2021 1144) LTG: Pt will perform toileting task (3/3 steps) with assistance level: (d/c goal) -- Note: Pt requires total A for toileting with bowel program and cathing    Problem: RH Functional Use of Upper Extremity Goal: LTG Patient will use RT/LT upper extremity as a (OT) Description: LTG: Patient will use right/left upper extremity as a stabilizer/gross assist/diminished/nondominant/dominant level with assist, with/without cues during functional activity (OT) Flowsheets (Taken 01/07/2021 1144) LTG: Use of upper extremity in functional activities:  RUE as gross assist level  LUE as gross assist level LTG: Pt will use upper extremity in functional activity with assistance level of: (downgraded JLS) Moderate Assistance - Patient 50 - 74% Note: downgraded JLS   Problem: RH Toilet Transfers Goal: LTG Patient will perform toilet transfers w/assist (OT) Description: LTG: Patient will perform toilet transfers with assist, with/without cues using equipment (OT) Outcome: Not Applicable Flowsheets (Taken 01/07/2021 1144) LTG: Pt will perform toilet transfers with assistance level of: (d/c goal) -- Note: Voiding is taking place at bed level

## 2021-01-07 NOTE — Progress Notes (Signed)
Occupational Therapy Session Note  Patient Details  Name: Karter Hellmer MRN: [id number] Date of Birth: Jun 25, 1945  Today's Date: 01/07/2021 OT Individual Time: 0900-1027 OT Individual Time Calculation (min): 87 min    Short Term Goals: Week 4:  OT Short Term Goal 1 (Week 4): STG=LTG 2/2 ELOS  Skilled Therapeutic Interventions/Progress Updates:    Pt resting in bed upon arrival with wife present for family education this morning. OT intervention with focus on education regarding morning dressing routine at bed level. Therapist demonstrated and pt's wife participated in donning [address], donning ace wraps on BLE, emptying foley catheter bag, threading foley cather bag, LB dressing including donning shoes, UB dressing, and donning abdominal binder. Pt provides appropriate feedback during routine and directs care of his wife independently. Pt's wife scheduled for two additional education sessions (9/6 and 9/7). Pt remained in bed awaiting scheduled PT session. Wife present.   Therapy Documentation Precautions:  Precautions Precautions: Back Precaution Comments: pathologic thoracic fracture- back precautions for safety Restrictions Weight Bearing Restrictions: No  Pain:  Pt denies pain this morning   Therapy/Group: Individual Therapy  Rich Brave 01/07/2021, 10:33 AM

## 2021-01-07 NOTE — Progress Notes (Signed)
Physical Therapy Session Note  Patient Details  Name: [name] MRN: [id number] Date of Birth: May 31, 1945  Today's Date: 01/07/2021 PT Individual Time: 1030-1200 PT Individual Time Calculation (min): 90 min   Short Term Goals: Week 3:  PT Short Term Goal 1 (Week 3): =LTG due to ELOS  Skilled Therapeutic Interventions/Progress Updates:    Pt received supine in bed handed off from OT session. Pt's wife present for hands-on family education session. Session focus on reviewing use of manual hoyer for transfer bed to/from [facility]. Rolling L/R with max A for placement of hoyer sling. Reviewed parts of manual hoyer device including how to lift/lower patient and how to widen legs of device. Pt's wife able to participate in hands-on session of placement of maxi sky sling and transferring patient bed to/from w/c with use of manual hoyer lift. Pt's wife requires cues during session for safe operation of equipment and how best to assist patient with transfer safely. Pt's family will benefit from ongoing education with regards to transfers. Provided handouts to patient and family regarding hoyer lift use, ACE-wrapping BLE, UE/LE PROM, and RLE therex. Reviewed LE stretches and RLE therex with patient and his wife. Discussed coming up with a routine/schedule at home for when to perform stretches and exercises. See below for RLE HEP. Also discussed use of PWC in the home. Pt's wife asking about use of a manual w/c as a "back-up" if PWC were to lose power, education with patient and family that he would not be safe to sit in a standard manual w/c due to trunk/core weakness and BP issues. Demonstrated PWC manual controls in case battery dies but also discussed importance of charging PWC each night. Continue to recommend pt use PWC upon d/c home. Pt to receive loaner w/c tomorrow and pt and wife to further discuss their concerns with ATP. Pt left semi-reclined in PWC in room with needs in reach, wife present.  Access Code:  [email or web address] URL: [email or web address] Date: 01/07/2021 Prepared by: Peter Congo  Exercises Supine Single Bent Knee Fallout - 1 x daily - 7 x weekly - 3 sets - 10 reps Supine Quad Set - 1 x daily - 7 x weekly - 3 sets - 10 reps Supine Single Knee to Chest Stretch - 1-2 x daily - 7 x weekly - 2 sets - 5 reps - 60 hold Supine Ankle Pumps - 1 x daily - 7 x weekly - 3 sets - 10 reps Supine Gluteal Sets - 1 x daily - 7 x weekly - 3 sets - 10 reps Seated Long Arc Quad - 1 x daily - 7 x weekly - 3 sets - 10 reps Seated March - 1 x daily - 7 x weekly - 3 sets - 10 reps   Therapy Documentation Precautions:  Precautions Precautions: Back Precaution Comments: pathologic thoracic fracture- back precautions for safety Restrictions Weight Bearing Restrictions: No   Therapy/Group: Individual Therapy   Peter Congo, PT, DPT, CSRS  01/07/2021, 1:09 PM

## 2021-01-08 NOTE — Progress Notes (Signed)
Physical Therapy Session Note  Patient Details  Name: [name] MRN: [id number] Date of Birth: February 09, 1946  Today's Date: 01/08/2021 PT Individual Time: 1000-1100; 1400-1500 PT Individual Time Calculation (min): 60 min and 60 min  Short Term Goals: Week 3:  PT Short Term Goal 1 (Week 3): =LTG due to ELOS  Skilled Therapeutic Interventions/Progress Updates:    Session 1: Pt received seated in bed handed off from OT session. No complaints of pain. Supine to sit total A x 2. Slide board transfer bed to w/c total A x 2 to PWC. [employer] [place], ATP present during session with loaner PWC to make adjustment to chair to fit patient as well as review controls of chair in order to make adjustments as needed. Pt has onset of "wooziness" while seated in PWC, BP 100/66 and wooziness rated at 2/10. Assisted pt with reclining back in chair, symptoms improve. During session pt has another onset of "wooziness", rated 5/10. Again assisted pt with tilting back in chair, BP 107/64, symptoms improve. Pt able to engage in discussion with PT and ATP regarding controls of chair and adjustments that need to be made including lateral thigh supports and elbow blocks. Pt left semi-reclined in PWC in room with needs in reach at end of session.  Session 2: Pt received seated in PWC in room with wife present. Session focus on ongoing hands-on family education with pt's wife. Reviewed PWC controls including on/off, speed change, change from drive controls to position change controls, how to perform position changes to assist pt with pressure relief, etc. Adjusted controller for improved patient grip. Pt able to drive PWC forwards/backwards, and turn around to park next to bed at Supervision level with assist needed to place UE on controller. Adjusted head rest for improved patient comfort and positioning in chair. Provided handouts on AD and OH and discussed patient's normal BP, BP parameters, signs/symptoms of AD and OH, and what  to do if patient experiences BP changes. Pt left seated in PWC in room with wife present in care of NT.  Therapy Documentation Precautions:  Precautions Precautions: Back Precaution Comments: pathologic thoracic fracture- back precautions for safety Restrictions Weight Bearing Restrictions: No     Therapy/Group: Individual Therapy   Peter Congo, PT, DPT, CSRS  01/08/2021, 12:11 PM

## 2021-01-08 NOTE — Progress Notes (Signed)
Occupational Therapy Session Note  Patient Details  Name: [name] MRN: [id number] Date of Birth: 02-Oct-1945  Today's Date: 01/08/2021 OT Individual Time: 1300-1355 OT Individual Time Calculation (min): 55 min    Short Term Goals: Week 4:  OT Short Term Goal 1 (Week 4): STG=LTG 2/2 ELOS  Skilled Therapeutic Interventions/Progress Updates:    Pt resting in PWC upon arrival with wife present. OT intervention with focus on ongoing education and discharge planning. Pt's wife practiced placement of hoyer sling while pt sitting in w/c. Discussed boosting schedule and pt able to recall schedule of 2 mins every 20 mins. Pt able to recall purpose and importance. Discussed placement of hoyer lift in bedroom at pt's home. CSW present for part of session. DABSC will be delivered and discussed purpose and need when pt's sitting balance improves. Discussed phone setup at home and assistive technology options for smart home. Discussed plan for education on 9/7. Pt remained seated in PWC with wife present.   Therapy Documentation Precautions:  Precautions Precautions: Back Precaution Comments: pathologic thoracic fracture- back precautions for safety Restrictions Weight Bearing Restrictions: No General:   Vital Signs:  Pain:  Pt denies pain this afternoon    Therapy/Group: Individual Therapy  Rich Brave 01/08/2021, 2:40 PM

## 2021-01-08 NOTE — Progress Notes (Signed)
Patient ID: Dois Juarbe, male   DOB: June 14, 1945, 75 y.o.   MRN: [id number]  SW waiting on follow-up from [facility] [phone]) about DME delivery tomorrow.  *confirms DME will be delivered tomorrow between 3pm-4pm.  SW met with pt and pt wife while in OT to review d/c. Wife reports DME tomorrow. SW informed HHPT/OT/SN/aide/SW will be with [facility]. SW also informed pt set up for transportation with [facility] for his follow-up appointments. SW reminded to allot 2hr window for [facility] transportation for pt arrival home. Time remains for p/u at 10am.   SW confirmed with [facility] ([id number]) p/u remains 10am on 9/8.   Loralee Pacas, MSW, [place] Office: [phone] Cell: [phone] Fax: [phone]

## 2021-01-08 NOTE — Progress Notes (Signed)
Occupational Therapy Session Note  Patient Details  Name: [name] MRN: [id number] Date of Birth: 1945/05/22  Today's Date: 01/08/2021 OT Individual Time: 0900-1000 OT Individual Time Calculation (min): 60 min    Short Term Goals: Week 4:  OT Short Term Goal 1 (Week 4): STG=LTG 2/2 ELOS  Skilled Therapeutic Interventions/Progress Updates:    OT intervention with focus on bed mobility, establishing morning routine, and ongoing discharge planning. Pt requires max A for rolling R/L in bed to facilitate LB dressing tasks. Pt provides appropriate feedback regarding his comfort and/or pain with transitional movements in bed. Max A for UB dressing with HOB elevated. Dependent for LB dressing, including ace wraps and ted hose. Pt remained in bed awaiting PT and ATP with loaner PWC for use.   Therapy Documentation Precautions:  Precautions Precautions: Back Precaution Comments: pathologic thoracic fracture- back precautions for safety Restrictions Weight Bearing Restrictions: No  Pain: Pain Assessment Pain Scale: 0-10 Pain Score: 0-No pain   Therapy/Group: Individual Therapy  Rich Brave 01/08/2021, 10:34 AM

## 2021-01-08 NOTE — Patient Care Conference (Signed)
Inpatient RehabilitationTeam Conference and Plan of Care Update Date: 01/08/2021   Time: 11:04 AM    Patient Name: [name]      Medical Record Number: [id number]  Date of Birth: 12-06-1945 Sex: Male         Room/Bed: 4M08C/4M08C-01 Payor Info: Payor: MEDICARE / Plan: MEDICARE PART A AND B / Product Type: *No Product type* /    Admit Date/Time:  12/13/2020  6:41 PM  Primary Diagnosis:  Quadriplegia [facility])  Hospital Problems: Principal Problem:   Quadriplegia (HCC) Active Problems:   Neurogenic bowel   Neurogenic bladder   Scrotal swelling   Foley catheter in place on admission   Dysphagia   Acute lower UTI   Labile blood pressure    Expected Discharge Date: Expected Discharge Date: 01/10/21  Team Members Present: Physician leading conference: Dr. Genice Rouge Social Worker Present: Cecile Sheerer, LCSWA Nurse Present: Kennyth Arnold, RN PT Present: Peter Congo, PT OT Present: Ardis Rowan, COTA;Jennifer Katrinka Blazing, OT PPS Coordinator present : Fae Pippin, SLP     Current Status/Progress Goal Weekly Team Focus  Bowel/Bladder   Foley in place, Bowel program. LBM 8/5  Pt gain cont of B/B.  Continue foley care and bowel program.   Swallow/Nutrition/ Hydration             ADL's   bathing-max A: UB dresing-max A; LB dressing-dependent; mod mobility-max A; SB transfers with tot A+2  modified: bathing/dressing-max A at bed level; self feeding-mod A; grooming-max A  ongoing family educaiton   Mobility   max A rolling, +2 total A supine to/from sit and SB transfers, PWC mobility Supervision to CGA x 200 ft, family ed with wife initiated 9/5  total A transfers, mod I PWC mobility  family education, d/c planning, PWC mobility and getting set up with loaner w/c   Communication             Safety/Cognition/ Behavioral Observations            Pain   no c/o pain.  pain<3  Assess Qshift and pRN   Skin   skin intact.  Maintain skin integrity  Assess Qshift and PRN      Discharge Planning:  Pt will d/c to home with 24/7 care from pt wife. Fam edu on 9/5 9am-12pm; 9/6 1pm-3pm; 9/7 9am-12pm. HHA- [facility] for HHPT/OT/SN/aide/SW. DME ordered through [facility]. D/c with ambulance transport- [facility] on 9/8 at 10am.   Team Discussion: Patient is concerned about hospital bed he is receiving. Will be on Lovenox for 3 months post discharge. Had another episode of autonomic dysreflexia. Foley in place, on bowel program, no pain, and sleeping okay. Redness to bottom and applying barrier cream. 160-170/70's is not his normal, this is autonomic dysreflexia and should be reported to nurse and MD every time. Family education Monday thru Wednesday this week. Equipment should be delivered tomorrow. Doing hoyer lift transfers with wife. Family education is on going and patient is good at directing his care.  Patient on target to meet rehab goals: yes  *See Care Plan and progress notes for long and short-term goals.   Revisions to Treatment Plan:  Not at this time.  Teaching Needs: Family education, medication management, foley care education, bowel program education, autonomic dysreflexia education, transfer training, balance training, endurance training, safety awareness.  Current Barriers to Discharge: Decreased caregiver support, Medical stability, Home enviroment access/layout, Incontinence, Neurogenic bowel and bladder, Wound care, Lack of/limited family support, Weight bearing restrictions, and Medication compliance  Possible Resolutions to Barriers: Continue current medications, provide emotional support.     Medical Summary Current Status: C5 quadriplegia; having epiqosde of AD- based on BP readings; bowel program; chronic foley; not clear if wife has done bowel program; redness on sacrum- barrier cream;  Barriers to Discharge: Decreased family/caregiver support;Home enviroment access/layout;Incontinence;Neurogenic Bowel & Bladder;Wound care  Barriers to Discharge  Comments: needs automatic hospital bed; will d/w pt- equipment to be delivered tomorrow; loaner w/c to be picked up tomorrow and delivered; Possible Resolutions to [employer] Focus: focus- family ed this week; doing hoyer training, is hands on; she's a little overwhelmed; but better; needs verifying bowel program is trained- 9/8 d/c   Continued Need for Acute Rehabilitation Level of Care: The patient requires daily medical management by a physician with specialized training in physical medicine and rehabilitation for the following reasons: Direction of a multidisciplinary physical rehabilitation program to maximize functional independence : Yes Medical management of patient stability for increased activity during participation in an intensive rehabilitation regime.: Yes Analysis of laboratory values and/or radiology reports with any subsequent need for medication adjustment and/or medical intervention. : Yes   I attest that I was present, lead the team conference, and concur with the assessment and plan of the team.   Tennis Must 01/08/2021, 2:55 PM

## 2021-01-08 NOTE — Discharge Summary (Signed)
Physician Discharge Summary  Patient ID: [name] MRN: [id number] DOB/AGE: 08-07-1945 75 y.o.  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 Concern for metastatic skeletal disease. EXAM: NUCLEAR MEDICINE WHOLE BODY BONE SCAN TECHNIQUE: Whole body anterior and posterior images were obtained approximately 3 hours after intravenous injection of radiopharmaceutical. RADIOPHARMACEUTICALS:  Twenty mCi Technetium-22m MDP IV COMPARISON:  Cervical MRI 12/08/2020 FINDINGS: There is a linear segment of abnormal radiotracer activity within the posterior RIGHT cervical paraspinal musculature which corresponds to abnormality within the paraspinal musculature on comparison MRI (image 27/21 on 12/07/20 and image 32/8 on MRI 12/08/2020). Activity at the level the thoracic inlet anteriorly may correlate the T1 lesion described on comparison MRIs. Elsewhere within the skeleton there is no foci of abnormal radiotracer activity. IMPRESSION: 1. Linear uptake within the paraspinal musculature pf RIGHT cervical spine posteriorly corresponds to abnormal signal intensity on comparison MRI. 2. Focal uptake anterior to the thoracic inlet suggest correlation with the T1 vertebral body lesion. 3. Above findings may relate to acute trauma, infection, or neoplasm. 4. No additional evidence of metastatic skeletal disease. Electronically Signed   By: Genevive Bi M.D.   On: 12/11/2020 19:09   DG CHEST PORT 1 VIEW  Result Date:  12/14/2020 CLINICAL DATA:  Leukocytosis EXAM: PORTABLE CHEST 1 VIEW COMPARISON:  12/07/2020 FINDINGS: Loop recorder device overlies the left chest wall. Heart size within normal limits. Atherosclerotic calcification of the aortic knob. Low lung volumes. Band-like opacities within the lung bases suggest atelectasis. Lungs appear otherwise clear. No pleural effusion or pneumothorax. IMPRESSION: Low lung volumes with bibasilar atelectasis. Electronically Signed   By: Duanne Guess D.O.   On: 12/14/2020 14:21    Labs:  Basic Metabolic Panel: Recent Labs  Lab 01/07/21 0516  NA 137  K 4.3  CL 103  CO2 22  GLUCOSE 101*  BUN 12  CREATININE 0.52*  CALCIUM 9.5    CBC: Recent Labs  Lab 01/07/21 0516  WBC 4.1  NEUTROABS 2.7  HGB 12.1*  HCT 36.4*  MCV 95.8  PLT 144*    CBG: No results for input(s): GLUCAP in the last 168 hours.  Family history.  Mother with cancer unknown as well as dementia.  Father with myocardial infarction clotting disorder.  Denies any colon cancer esophageal cancer or rectal cancer  Brief HPI:   [name] is a 75 y.o. right-handed male with unremarkable past medical history except spinal stenosis C7-T1 not requiring surgical intervention on no prescription medications.  Lives with spouse independent prior to admission.  Presented 12/07/2020 with onset of progressive diffuse weakness pain across his neck/shoulders and inability to walk.  Urine drug screen negative.  MRI of the brain revealed acute left periolandic infarct with mild small vessel disease.  MRI CT/T-spine revealed lesion involving T1 vertebral and right side posterior elements with associated pathological vertebral fracture concerning for metastatic disease with small volume epidural tumor at T1, abnormal T2 hyperintensity involving central and ventral aspects of spinal cord from C5-T2 level question cord infarct versus transverse myelitis versus inflammatory process,  mild to moderate spinal stenosis C7-T1  and mild to moderate neuroforaminal stenosis C4-T1 disc and facet.  Neurology consulted Dr. 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  Dr.Xu felt that stroke to brain/spine likely cardioembolic in nature and recommended DAPT as well as loop recorder to rule out atrial fibrillation.  Carotid Dopplers negative for ICA stenosis.  Bilateral extremity Dopplers negative.  Echocardiogram with ejection fraction of 60 to 65% no wall motion abnormalities.  Dr.Gorsuch of hematology services consulted for input on pancytopenia question metastatic prostate cancer due to work-up revealing enlarged prostate and elevated PSA.  Bone scan with bone biopsy recommended for work-up of pancytopenia.  Family not interested in aggressive cancer work-up at this time.  He was cleared to begin Lovenox for DVT prophylaxis.  Aspirin and Plavix for CVA prophylaxis x3 weeks then aspirin alone.  Therapy evaluations completed due to patient decreased functional mobility was admitted for a comprehensive rehab program.   Hospital Course: [name] was admitted to rehab 12/13/2020 for inpatient therapies to consist of PT, ST and OT at least three hours five days a week. Past admission physiatrist, therapy team and rehab RN have worked together to provide customized collaborative inpatient rehab..  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  Patient also with lesion involving T1 vertebral body and right side posterior elements with associated pathologic vertebral fracture concerning for metastatic disease question prostate or other neoplasm.  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  Aspirin Plavix x3 weeks and aspirin alone follow-up neurology services.  Venous Doppler studies negative maintain Lovenox for 3  months total.  Pain managed use of Lidoderm patch.  Mood stabilization with Celexa melatonin for sleep emotional support provided.  Neurogenic bowel bladder Foley tube had been placed for multiple in and out catheterizations continue Foley tube due to enlarged scrotum.  He did complete a course of Macrobid for UTI.  Bowel program had been established with family teaching completed.  Elevated PSA/enlarged prostate family currently not committed to oncology work-up.  Close monitoring during therapies for autonomic dysreflexia which again was discussed with family.   Blood pressures were monitored on TID basis and soft and monitored     Rehab course: During patient's stay in rehab weekly team conferences were held to monitor patient's progress, set goals and discuss barriers to discharge. At admission, patient required total assist for rolling +2 physical assist side-lying to sitting +2 physical assist sit to side-lying  Physical exam.  Blood pressure 145/79 pulse 72 temperature 97.9 respirations 20 oxygen saturation 94% room air Constitutional.  No acute distress HEENT Head.  Normocephalic and atraumatic Eyes.  Pupils round and reactive to light no discharge.nystagmus Neck.  Supple nontender no JVD without thyromegaly Cardiac regular rate rhythm any extra sounds or murmur heard Abdomen.  Soft nontender positive bowel sounds without rebound Skin.  Warm and dry Musculoskeletal.  Right upper extremity biceps 4+/5, wrist extension 3 -/5, triceps 2 -/5, grip 2 -/5, finger abduction 0/5 Left lower extremity biceps 5 -/5, wrist extension 3 -/5, tricep 0/5, grip 1/5, and finger abduction 0/5 Lower extremity 0/5 in hip flexors, knee extension, dorsiflexion, plantarflexion and EHL B/L B/L shoulder/deltoids TTP especially just past AC joints less TTP over RTC/posterior shoulder Neurologic.  Alert oriented follows commands  He/She  has had improvement in activity tolerance, balance, postural control as  well as ability to compensate for deficits. He/She has had improvement in functional use RUE/LUE  and RLE/LLE as well as improvement in awareness.  Intense family teaching ongoing sessions focused on reviewing of all manual Hoyer lift for transfers bed to and from power wheelchair.  Rolling left to right with max assist for placement of Hoyer sling.  Reviewed parts of manual Hoyer device including how to lift lower patient out to widen legs of device.  [name] able to participate hands-on sessions.  Therapist work with family demonstrating for ADLs wife participating in donning TED hose donning Ace wraps on bilateral lower extremities emptying Foley catheter bag threading Foley catheter bag lower body dressing including donning shoes upper body dressing and donning abdominal binder.  Full family teaching completed plan discharged to home       Disposition: Discharged to home    Diet: Regular  Special Instructions: No smoking or alcohol  Routine emptying of Foley tube  Ambulatory referral obtained with urology services routine changing out of Foley catheter tube  Medications at discharge 1.  Tylenol as needed 2.  Lipitor 80 mg daily 3.  Baclofen 5 mg 3 times daily 4.  Dulcolax suppository daily 5.  Celexa 20 mg daily 6.  Lovenox 40 mg daily until 03/15/2021 and stop 7.  Lidoderm patch as directed 8.  Melatonin 3 mg nightly as needed sleep 9.  Protonix 40 mg p.o. daily 10.  Senokot S1 tablet nightly as needed 11.  Aspirin 81 mg daily  30-35 minutes were spent completing discharge summary and discharge planning   Discharge Instructions     Ambulatory referral to Neurology   Complete by: As directed    An appointment is requested in approximately: 4 weeks bilateral small frontal infarcts   Ambulatory referral to Physical Medicine Rehab   Complete by: As directed    Moderate complexity follow-up 1 to 2 weeks left nontraumatic quadriplegia   Ambulatory referral to Urology    Complete by: As directed    Follow-up urology services for routine changing out of Foley catheter tube        Follow-up Information     Lovorn, Aundra Millet, MD Follow up.   Specialty: Physical Medicine and Rehabilitation Why: Office to call for appointment Contact information: [address] [place] [address] [phone]         Artis Delay, MD Follow up.   Specialty: Hematology and Oncology Why: Call for appointment Contact information: [address] [place] [address] [phone]         Lanier Prude, MD Follow up.   Specialties: Cardiology, Radiology Why: Follow-up in regards to loop recorder Contact information: [address] [place] [address] [phone]                 Signed: Mcarthur Rossetti Warwick Nick 01/10/2021, 5:05 AM

## 2021-01-08 NOTE — Progress Notes (Signed)
PROGRESS NOTE   Subjective/Complaints:  Pt reports wife has done bowel program- asking a lot of questions about bed will get at home- explained it's not exactly like hospital bed- have to d/w Auria.   Also discussed with pt if has multiple power outages, strongly suggest a small generator for air mattress, and w/c- it's NOT covered by insurance.   ROS:   Pt denies SOB, abd pain, CP, N/V/C/D, and vision changes   Objective:   No results found. Recent Labs    01/07/21 0516  WBC 4.1  HGB 12.1*  HCT 36.4*  PLT 144*     Recent Labs    01/07/21 0516  NA 137  K 4.3  CL 103  CO2 22  GLUCOSE 101*  BUN 12  CREATININE 0.52*  CALCIUM 9.5      Intake/Output Summary (Last 24 hours) at 01/08/2021 1040 Last data filed at 01/08/2021 0700 Gross per 24 hour  Intake 350 ml  Output 1000 ml  Net -650 ml        Physical Exam: Vital Signs Blood pressure 131/67, pulse (!) 58, temperature 97.9 F (36.6 C), resp. rate 18, height 5' 8.5" (1.74 m), weight 73.2 kg, SpO2 98 %.    General: awake, alert, appropriate, laying supine in bed; quiet; unhappy about generator not being covered; NAD HENT: conjugate gaze; oropharynx moist CV: regular rate; no JVD Pulmonary: CTA B/L; no W/R/R- good air movement GI: soft, NT, ND, (+)BS Psychiatric: appropriate; quiet Neurological: Ox3 MAS of 1+-no change, but no spasms with ROM GU: scrotal swelling resolved- foley in place Motor- lacking ~ 5 degrees of L elbow extension RUE: biceps 4/5, WE 3-/5, triceps 2-/5, grip 2-/5, finger abd 0/5, unchanged LUE: biceps 4/5, WE 3-/5, triceps 0/5, grip 1/5, and finger abd 0/5,stable LE's 0/5 in HF, KE, DF, APF except NEW movement of R foot- can wiggle toes- HF 1/5, KE, 0/5, DF 0/5 and PF 1/5- today-  had a little questionable movement of LLE at knee - R KE/KF 1/5-   Assessment/Plan: 1. Functional deficits which require 3+ hours per day of  interdisciplinary therapy in a comprehensive inpatient rehab setting. Physiatrist is providing close team supervision and 24 hour management of active medical problems listed below. Physiatrist and rehab team continue to assess barriers to discharge/monitor patient progress toward functional and medical goals  Care Tool:  Bathing        Body parts bathed by helper: Right arm, Left arm, Chest, Abdomen, Front perineal area, Buttocks, Right upper leg, Left upper leg, Right lower leg, Left lower leg, Face     Bathing assist Assist Level: 2 Helpers     Upper Body Dressing/Undressing Upper body dressing   What is the patient wearing?: Pull over shirt    Upper body assist Assist Level: Total Assistance - Patient < 25%    Lower Body Dressing/Undressing Lower body dressing      What is the patient wearing?: Pants     Lower body assist Assist for lower body dressing: 2 Helpers     Toileting Toileting    Toileting assist Assist for toileting: 2 Helpers     Transfers Chair/bed transfer  Transfers  assist  Chair/bed transfer activity did not occur: Safety/medical concerns  Chair/bed transfer assist level: Dependent - mechanical lift     Locomotion Ambulation   Ambulation assist   Ambulation activity did not occur: Safety/medical concerns          Walk 10 feet activity   Assist  Walk 10 feet activity did not occur: Safety/medical concerns        Walk 50 feet activity   Assist Walk 50 feet with 2 turns activity did not occur: Safety/medical concerns         Walk 150 feet activity   Assist Walk 150 feet activity did not occur: Safety/medical concerns         Walk 10 feet on uneven surface  activity   Assist Walk 10 feet on uneven surfaces activity did not occur: Safety/medical concerns         Wheelchair     Assist Is the patient using a wheelchair?: Yes Type of Wheelchair: Power Wheelchair activity did not occur: Safety/medical  concerns (limited tolerance for upright)  Wheelchair assist level: Contact Guard/Touching assist Max wheelchair distance: 150'    Wheelchair 50 feet with 2 turns activity    Assist    Wheelchair 50 feet with 2 turns activity did not occur: Safety/medical concerns   Assist Level: Contact Guard/Touching assist   Wheelchair 150 feet activity     Assist  Wheelchair 150 feet activity did not occur: Safety/medical concerns   Assist Level: Contact Guard/Touching assist   Blood pressure 131/67, pulse (!) 58, temperature 97.9 F (36.6 C), resp. rate 18, height 5' 8.5" (1.74 m), weight 73.2 kg, SpO2 98 %.  Medical Problem List and Plan: 1.  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 Pt also with lesion involving T1 vertebral body and right side posterior elements with associated pathologic vertebral fracture concerning for metastatic disease (?prostate) or other neoplasm. 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  Status post plan loop recorder  -turn q2-3 hours to reduce risk of pressure ulcers.  --family meeting done- also went over prognosis- I.e. 75% will be back of what HE gets back by 6 months- won't get more return after 18 months Con't PT and OT/CIR- getting movement in both legs- not antigravity so far.  9/6- con't PT and OT- and family training- wife needs to be comfortable with lovenox, bowel program and transfers- d/c 9/8- team conference today to go over everything.  2.  Impaired mobility: -DVT/anticoagulation: Venous Doppler studies negative.  Continue Lovenox Pharmaceutical: Lovenox  9/2- educated that needs lovenox x 3 months total.              -antiplatelet therapy: Continue Aspirin 81 mg daily and Plavix 75 mg day x3 weeks and aspirin alone 3. Pain: Tylenol   - Lidoderm patches 8pm to 8am -3 of them -kpad for shoulder pain, encouraged use of tylenol 9/4- pain controlled-  continue regimen 4. Mood/depression/insomnia: Provide emotional support  Celexa 10 mg daily started on 8/12  8/27- will increase Celexa to 20 mg daily-   -continue melatonin for sleep  -neuropsych intervention would be helpful for him             -antipsychotic agents: N/A 5. Neuropsych: This patient is capable of making decisions on his own behalf. 6. Skin/Wound Care: Routine skin checks 7. Fluids/Electrolytes/Nutrition: Routine in and outs 8.  Thrombocytopenia.  Resolved 9.  Neurogenic bowel bladder.  Foley catheter tube has been  placed for multiple in and out catheterization Lidocaine jelly for bowel program.  8/15- con't Foley due to enlarged scrotum- would be hard to cath- likely won't void-  - bowel program- with suppository/dig stim prn per wife's request 8/23- bowel program last night- good results- still has foley due to enlarged scrotum, but will d/w pt/family more about chronic foley? 8/28- d/w wife/pt- they want to go with chronic foley since will make the stress less-  9/6- bowel program nightly- going well- con't regimen 10.  Elevated PSA/enlarged prostate.  Family currently not committed to oncology work-up.  Family has asked [place] to not discuss cancer possibility while here   12. Scrotal swelling- will elevate and Hospitalist is checking pro-BNP and giving a dose of Lasix.   8/25- resolved Will monitor/follow.  13. Dysphagia- on D3 thin diet- will con't SLP for swallowing therapy.  14. Resp issues-  Flutter valve- also con't ICS if desired.  15. Leukocytosis-UTI Completed macrobid 16.  Blood pressure   Vitals:   01/07/21 1930 01/08/21 0536  BP: 124/67 131/67  Pulse: 65 (!) 58  Resp: 16 18  Temp: 98.2 F (36.8 C) 97.9 F (36.6 C)  SpO2: 98% 98%    8/31- elevated again- d/w nursing again 17. Autonomic dysreflexia (AD)  9/1- BP 130/73- will monitor- educated wife/family on AD today  9/5- needs to have BP rechecked since was 167 systolic and worried about AD- will  have nursing to recheck.   9/6- BP more reasonable today- con't to monitor= will need BP cuff at home.  18. Spasticity  8/24- pt doesn't want to increase spasticity meds as of yet.  19. Orthostatic hypotension  8/26- asked pt to push fluids- also spoke to head of therapy to get weighted cup and long straw so pt can drink without calling staff. Will also insist on TEDs- wait on abd binder? 20. Blanchable skin on sacrum  8/26- needs to be turned q2 hours when in bed- and foam dressing placed.   9/1- starting TEDs and ACE wraps- wait on meds 21. Nasal congestion  8/27- not clear if underlying or due to AD- but pt wants ot wait on Flonase- will con't nasal sline.  22. Access to water  8/28- d/w another SCI program- they suggest actually using O2 tubing and attaching to sip and puff and putting into weighted cup- LOWER than sip and puff so doesn't backflow onto pt- will try and arrange  9/2- has access to water 23. Dispo  9/6- suggest generator at home for hospital bed and w/c  Pt requires incontinence supplies due to neurogenic bowel/bowel incontinence as well as Neurogenic bladder and urinary retention with overflow incontinence- also needs Foley for urinary retention- - adult briefs, gloves and underpads/chux. Will need lifetime!   LOS: 26 days A FACE TO FACE EVALUATION WAS PERFORMED  Zabella Wease 01/08/2021, 10:40 AM

## 2021-01-09 MED ORDER — PANTOPRAZOLE SODIUM 40 MG PO TBEC: 40.0000 mg | DELAYED_RELEASE_TABLET | Freq: Every day | ORAL | 0 refills | Status: DC

## 2021-01-09 MED ORDER — CITALOPRAM HYDROBROMIDE 20 MG PO TABS: 20.0000 mg | ORAL_TABLET | Freq: Every day | ORAL | 0 refills | Status: DC

## 2021-01-09 MED ORDER — ACETAMINOPHEN 325 MG PO TABS: 650.0000 mg | ORAL_TABLET | ORAL | Status: DC | PRN

## 2021-01-09 MED ORDER — BACLOFEN 5 MG PO TABS: 5.0000 mg | ORAL_TABLET | Freq: Three times a day (TID) | ORAL | 0 refills | Status: DC

## 2021-01-09 MED ORDER — LIDOCAINE 5 % EX PTCH: 3.0000 | MEDICATED_PATCH | CUTANEOUS | 0 refills | Status: DC

## 2021-01-09 MED ORDER — ENOXAPARIN SODIUM 40 MG/0.4ML IJ SOSY: PREFILLED_SYRINGE | INTRAMUSCULAR | 1 refills | Status: DC

## 2021-01-09 MED ORDER — ATORVASTATIN CALCIUM 80 MG PO TABS: 80.0000 mg | ORAL_TABLET | Freq: Every day | ORAL | 0 refills | Status: DC

## 2021-01-09 MED ORDER — BISACODYL 10 MG RE SUPP: 10.0000 mg | Freq: Every day | RECTAL | 0 refills | Status: DC

## 2021-01-09 NOTE — Progress Notes (Signed)
Physical Therapy Discharge Summary  Patient Details  Name: [name] MRN: [id number] Date of Birth: [birth year]   Patient has met 2 of 7 long term goals due to improved activity tolerance, increased strength, and ability to compensate for deficits.  Patient to discharge at a wheelchair level Total Assist.   Patient's care partner is independent to provide the necessary physical assistance at discharge. Pt's wife has completed hands on family training and is safe to assist patient upon d/c home.  Reasons goals not met: Patient did not meet sitting balance goal of mod A as he remains total A for sitting balance. He did not meet car transfer goal as it was not safe to attempt a car transfer due to his current functional level. Pt did not meet power wheelchair propulsion goals of mod I as he remains setup A due to assist needed to place RUE on PWC controller. Pt should receive a loaner w/c with the appropriate controller so that he can be more independent with PWC mobility.  Recommendation:  Patient will benefit from ongoing skilled PT services in home health setting to continue to advance safe functional mobility, address ongoing impairments in endurance, strength, ROM, balance, independence and safety with transfers, power wheelchair mobility, and minimize fall risk.  Equipment: Hospital bed, manual hoyer lift, loaner power wheelchair from [employer]  Reasons for discharge: treatment goals met and discharge from hospital  Patient/family agrees with progress made and goals achieved: Yes  PT Discharge Precautions/Restrictions Precautions Precautions: Back Restrictions Weight Bearing Restrictions: No Pain Interference Pain Interference Pain Effect on Sleep: 1. Rarely or not at all Pain Interference with Therapy Activities: 1. Rarely or not at all Pain Interference with Day-to-Day Activities: 2. Occasionally Vision/Perception  Perception Perception: Within Functional Limits Praxis Praxis:  Intact  Cognition Overall Cognitive Status: Within Functional Limits for tasks assessed Arousal/Alertness: Awake/alert Orientation Level: Oriented X4 Year: 2022 Month: August Day of Week: Correct Attention: Selective Selective Attention: Appears intact Memory: Appears intact Awareness: Appears intact Problem Solving: Appears intact Safety/Judgment: Appears intact Sensation Sensation Light Touch: Impaired Detail Light Touch Impaired Details: Impaired RUE;Impaired LUE;Impaired RLE;Impaired LLE (C5 SCI) Proprioception: Impaired Detail Proprioception Impaired Details: Impaired RUE;Impaired LUE;Impaired RLE;Impaired LLE (C5 SCI) Coordination Gross Motor Movements are Fluid and Coordinated: No Fine Motor Movements are Fluid and Coordinated: No Heel Shin Test: unable Motor  Motor Motor: Tetraplegia;Abnormal tone;Abnormal postural alignment and control Motor - Skilled Clinical Observations: C5 tetraplegia Motor - Discharge Observations: C5 tetraplegia  Mobility Bed Mobility Bed Mobility: Rolling Right;Rolling Left;Supine to Sit;Sit to Supine Rolling Right: Maximal Assistance - Patient 25-49% Rolling Left: Maximal Assistance - Patient 25-49% Supine to Sit: 2 Helpers Sit to Supine: 2 Helpers Transfers Transfers: Adult nurse via Arts development officer Transfers: 2 Press photographer (Assistive device): Other (Comment) (slide board) Transfer via Lift Equipment: Occupational psychologist Ambulation: No Gait Gait: No Stairs / Additional Locomotion Stairs: No Pick up small object from the floor (from standing position) activity did not occur: Safety/medical Administrator, sports: Yes Wheelchair Assistance: Set up Lexicographer: Power Wheelchair Parts Management: Needs assistance Distance: 200  Trunk/Postural Assessment  Cervical Assessment Cervical Assessment: Exceptions to [facility] (forward head) Thoracic  Assessment Thoracic Assessment: Exceptions to [facility] (kyphotic) Lumbar Assessment Lumbar Assessment: Exceptions to [facility] (posterior pelvic tilt) Postural Control Postural Control: Deficits on evaluation Head Control: impaired, diffulty maintaing upright gaze due to weakness Trunk Control: dependent for sitting Righting Reactions: absent LEs, severely impaired trunk/UEs Protective Responses: absent LEs, severely impaired  trunk/UEs Postural Limitations: tetraplegia  Balance Balance Balance Assessed: Yes Static Sitting Balance Static Sitting - Balance Support: No upper extremity supported;Feet supported Static Sitting - Level of Assistance: 1: +1 Total assist Dynamic Sitting Balance Dynamic Sitting - Balance Support: Feet supported;During functional activity Dynamic Sitting - Level of Assistance: 1: +2 Total assist Extremity Assessment   RLE Assessment RLE Assessment: Exceptions to [facility] Passive Range of Motion (PROM) Comments: some tightness in hips General Strength Comments: improved since eval, see below RLE Strength Right Hip Flexion: 2-/5 Right Knee Flexion: 1/5 Right Knee Extension: 2/5 Right Ankle Dorsiflexion: 3/5 RLE Tone RLE Tone: Mild LLE Assessment LLE Assessment: Exceptions to [facility] Passive Range of Motion (PROM) Comments: tight hip ROM General Strength Comments: impaired, see below LLE Strength Left Hip Flexion: 0/5 Left Hip Extension: 1/5 Left Knee Flexion: 0/5 Left Knee Extension: 1/5 Left Ankle Dorsiflexion: 0/5 LLE Tone LLE Tone: Mild     Excell Seltzer, PT, DPT, CSRS 01/09/2021, 4:00 PM

## 2021-01-09 NOTE — Progress Notes (Signed)
Physical Therapy Session Note  Patient Details  Name: [name] MRN: [id number] Date of Birth: [birth year]  Today's Date: 01/09/2021 PT Individual Time: 1030-1145; 1500-1530 PT Individual Time Calculation (min): 75 min and 30 min  Short Term Goals: Week 3:  PT Short Term Goal 1 (Week 3): =LTG due to ELOS  Skilled Therapeutic Interventions/Progress Updates:    Session 1: Pt received seated in bed, session focus on family education with patient's wife. No complaints of pain. Reviewed placement of hoyer sling via rolling in bed with max A. Pt's wife able to place sling with cues for sequencing. Hoyer transfer bed to [facility], pt's wife able to recall steps of transfer and lead transfer bed to/from w/c. Once seated in PWC adjusted head rest for improved patient comfort. Pt requesting to return to bed due to fatigue. Reviewed how to place hoyer sling under patient while seated in chair. Hoyer transfer back to bed. Rolling L/R with max A for removal of sling. Pt's wife demos good understanding of how to assist patient with rolling in bed, placing hoyer sling, and performing manual hoyer transfer bed to/from w/c. Reviewed care of Roho cushion and how to properly inflate, pt and his wife state understanding of Roho cushion management. Pt left seated in bed with needs in reach at end of session. Pt and wife with no further questions. Pt missed 15 min of scheduled therapy session due to fatigue.  Session 2: Pt received seated in bed, agreeable to PT session. No complaints of pain. Assisted pt with doffing BLE ACE wrap and TED hose. Reassessed BLE strength and ROM, see d/c note for details. Placed PRAFO boots on patient. Cotreatment session with LRT with focus on discussion of upcoming d/c home, mobility in the community, accessibility in the home and community, and transportation options. Pt left seated in bed with needs in reach at end of session.  Therapy Documentation Precautions:  Precautions Precautions:  Back Precaution Comments: pathologic thoracic fracture- back precautions for safety Restrictions Weight Bearing Restrictions: No General: PT Amount of Missed Time (min): 15 Minutes PT Missed Treatment Reason: Patient fatigue      Therapy/Group: Individual Therapy   Peter Congo, PT, DPT, CSRS  01/09/2021, 12:26 PM

## 2021-01-09 NOTE — Progress Notes (Signed)
Recreational Therapy Session Note  Patient Details  Name: [name] MRN: [id number] Date of Birth: June 21, 1945 Today's Date: 01/09/2021  Pain: no c/o Skilled Therapeutic Interventions/Progress Updates: Pt in bed upon arrival talking with PT.  Education/discussion on discharge planning, self advocacy/directing his care and community pursuits with supervision cues.  Provided information on handicapped accessible [employer] rentals should they be interested in that in the future.  Pt very appreciative of care he has received and is looking forward to going home tomorrow.  Therapy/Group: Co-Treatment  Sedrick Tober 01/09/2021, 3:43 PM

## 2021-01-09 NOTE — Progress Notes (Signed)
Occupational Therapy Discharge Summary  Patient Details  Name: [name] MRN: [id number] Date of Birth: [birth year]  Patient has met 5 of 5 long term goals due to ability to compensate for deficits and education completed for care and education of SCI information .  Pt made minimal progress with ADLs during this admission. Pt requires max A for grooming, UB dressing, and bathing at bed level. Pt's wife has been present and actively participated in therapy sessions. Pt provides appropriate verbal cues to wife regarding his care. Wife has return demonstrated self care tasks at bed level. Patient to discharge at overall  mod to total A   level.  Patient's care partner is independent to provide the necessary physical assistance at discharge.    Reasons goals not met: n/a  Recommendation:  Patient will benefit from ongoing skilled OT services in home health setting to continue to advance functional skills in the area of BADL and Reduce care partner burden.  Equipment: DABSC, hoyer lift, hospital bed  Reasons for discharge: treatment goals met and discharge from hospital  Patient/family agrees with progress made and goals achieved: Yes  OT Discharge   Vision Baseline Vision/History: 1 Wears glasses Wears Glasses: Reading only Patient Visual Report: No change from baseline Vision Assessment?: No apparent visual deficits Perception  Perception: Within Functional Limits Praxis Praxis: Not tested Cognition Overall Cognitive Status: Within Functional Limits for tasks assessed Arousal/Alertness: Awake/alert Orientation Level: Oriented X4 Immediate Memory Recall: Sock;Blue;Bed Memory Recall Sock: Without Cue Memory Recall Blue: Without Cue Memory Recall Bed: Without Cue Awareness: Appears intact Problem Solving: Appears intact Problem Solving Impairment: Verbal complex Safety/Judgment: Appears intact Sensation Sensation Hot/Cold: Appears Intact Proprioception: Impaired  Detail Proprioception Impaired Details: Impaired RLE;Impaired LLE Stereognosis: Not tested Coordination Gross Motor Movements are Fluid and Coordinated: No Fine Motor Movements are Fluid and Coordinated: No Finger Nose Finger Test: unable to complete Motor  Motor Motor: Tetraplegia    Trunk/Postural Assessment  Cervical Assessment Cervical Assessment: Exceptions to [facility] Thoracic Assessment Thoracic Assessment: Exceptions to [facility] Lumbar Assessment Lumbar Assessment: Exceptions to [facility] Postural Control Trunk Control: dependent for sitting Righting Reactions: absent LEs, severely impaired trunk/UEs  Balance Static Sitting Balance Static Sitting - Balance Support: Feet supported Static Sitting - Level of Assistance: 1: +1 Total assist Dynamic Sitting Balance Dynamic Sitting - Balance Support: During functional activity Dynamic Sitting - Level of Assistance: 1: +2 Total assist Extremity/Trunk Assessment RUE Assessment RUE Assessment: Exceptions to [facility] Passive Range of Motion (PROM) Comments: WFL Active Range of Motion (AROM) Comments: Pt with limited AROM, keeping generally flexed posture, able to extend elbow to 70*, wrist extension, wrist flexion2/5 General Strength Comments: 3-/5 shoulder, 3-5 elbow flexion, 2+/5 elbow extension, 3-/5 wrist flexion/extension, 0/5 fingers LUE Assessment LUE Assessment: Exceptions to [facility] Passive Range of Motion (PROM) Comments: WFL Active Range of Motion (AROM) Comments: shoulder ~45*, elbow ~70*, elbow flexion 45*, elbow extension 70* General Strength Comments: 2+/5 overall   Leroy Libman 01/09/2021, 6:59 AM

## 2021-01-09 NOTE — Progress Notes (Signed)
Inpatient Rehabilitation Care Coordinator Discharge Note   Patient Details  Name: [name] MRN: [id number] Date of Birth: 06-15-1945   Discharge location: D/c to home with 24/7 care from wife  Length of Stay: 27 days  Discharge activity level: Max A  Home/community participation: Limited  Patient response TR:[id number] Literacy - How often do you need to have someone help you when you read instructions, pamphlets, or other written material from your doctor or pharmacy?: Never  Patient response [id number] Isolation - How often do you feel lonely or isolated from those around you?: Never  Services provided included: MD, RD, OT, PT, RN, CM, Neuropsych, SW, Pharmacy, TR  Financial Services:  Field seismologist Utilized: [employer]  Choices offered to/list presented to: Yes  Follow-up services arranged:  Home Health, DME Home Health Agency: [facility] for HHPT/OT/Aide/SN/SW    DME : [facility] for electric hospital bed, hoyer lift with sling, DABSC, and loaner power w/c    Patient response to transportation need: Is the patient able to respond to transportation needs?: Yes In the past 12 months, has lack of transportation kept you from medical appointments or from getting medications?: No In the past 12 months, has lack of transportation kept you from meetings, work, or from getting things needed for daily living?: No  Comments (or additional information):  Patient/Family verbalized understanding of follow-up arrangements:  Yes  Individual responsible for coordination of the follow-up plan: contact pt wife eElma [phone]  Confirmed correct DME delivered: Gretchen Short 01/09/2021    Gretchen Short

## 2021-01-09 NOTE — Progress Notes (Signed)
Occupational Therapy Session Note  Patient Details  Name: Herminio Kniskern MRN: [id number] Date of Birth: [birth year]  Today's Date: 01/09/2021 OT Individual Time: 0900-1025 OT Individual Time Calculation (min): 85 min    Short Term Goals: Week 4:  OT Short Term Goal 1 (Week 4): STG=LTG 2/2 ELOS  Skilled Therapeutic Interventions/Progress Updates:    Pt resting in bed upon arrival with wife present for ongoing family education. Pt's wife return demonstrated donning ted hose, ace wraps, and all dressing tasks at bed level. Pt provided appropriate feedback and directions throughout session. Pt's wife required min verbal cues from OTA and pt. Wife demonstrated emptying of foley catheter bag. OTA coordinated with PA and charge nurse regarding education on foley care and having foley changed before discharge. Pt remained in bed with wife present.   Therapy Documentation Precautions:  Precautions Precautions: Back Precaution Comments: pathologic thoracic fracture- back precautions for safety Restrictions Weight Bearing Restrictions: No  Pain:  Pt denies pain this morning   Therapy/Group: Individual Therapy  Rich Brave 01/09/2021, 10:36 AM

## 2021-01-10 NOTE — Progress Notes (Signed)
Patient ID: [name], male   DOB: [birth year], 75 y.o.   MRN: [id number]  SW returned phone call to pt wife [name] with concerns reported about hospital bed concerns and power chair. SW informed per note entered by Dr. Berline Chough, she addressed all these concerns. SW informed her will call to verify if a pressure alternating mattress was delivered. She was amenable to outpatient palliative care follow-up.  SW spoke with Sarah/[employer] to discuss hospital bed mattress. Reports this is being addressed now, and should be delivered to the home shortly.   SW spoke with Chrislyn King/[facility] [phone]) to discuss outpatient palliative care referral. Will f/u with family.   Cecile Sheerer, MSW, LCSWA Office: [phone] Cell: [phone] Fax: [phone]

## 2021-01-10 NOTE — Progress Notes (Signed)
PROGRESS NOTE   Subjective/Complaints:  Pt reports concerned about no air mattress overlay and bed-   Also discussed  d/c and need to call my office if any issues.   Also concerned loaner w/c hadn't been picked up yet- called Barbara CowerJason x2 and discussed hospital bed as well as air overlay and plug with 2 prongs- Barbara CowerJason will change out plug.    ROS:   Pt denies SOB, abd pain, CP, N/V/C/D, and vision changes   Objective:   No results found. No results for input(s): WBC, HGB, HCT, PLT in the last 72 hours.    No results for input(s): NA, K, CL, CO2, GLUCOSE, BUN, CREATININE, CALCIUM in the last 72 hours.     Intake/Output Summary (Last 24 hours) at 01/10/2021 0906 Last data filed at 01/10/2021 0818 Gross per 24 hour  Intake 720 ml  Output 1675 ml  Net -955 ml        Physical Exam: Vital Signs Blood pressure 134/84, pulse 76, temperature 98.2 F (36.8 C), temperature source Oral, resp. rate 16, height 5' 8.5" (1.74 m), weight 73.2 kg, SpO2 97 %.     General: awake, alert, appropriate, sitting up in bed; sip and puff available. NAD HENT: conjugate gaze; oropharynx moist CV: regular rate; no JVD Pulmonary: CTA B/L; no W/R/R- good air movement GI: soft, NT, ND, (+)BS Psychiatric: appropriate Neurological: Ox3  MAS of 1+-no change, but no spasms with ROM GU: scrotal swelling resolved- foley in place Motor- lacking ~ 5 degrees of L elbow extension RUE: biceps 4/5, WE 3-/5, triceps 2-/5, grip 2-/5, finger abd 0/5, unchanged LUE: biceps 4/5, WE 3-/5, triceps 0/5, grip 1/5, and finger abd 0/5,stable LE's 0/5 in HF, KE, DF, APF except NEW movement of R foot- can wiggle toes- HF 1/5, KE, 0/5, DF 0/5 and PF 1/5- today-  had a little questionable movement of LLE at knee - R KE/KF 1/5-   Assessment/Plan: 1. Functional deficits which require 3+ hours per day of interdisciplinary therapy in a comprehensive inpatient rehab  setting. Physiatrist is providing close team supervision and 24 hour management of active medical problems listed below. Physiatrist and rehab team continue to assess barriers to discharge/monitor patient progress toward functional and medical goals  Care Tool:  Bathing    Body parts bathed by patient: Chest, Abdomen, Left arm   Body parts bathed by helper: Right arm, Front perineal area, Buttocks, Right upper leg, Left upper leg, Right lower leg, Left lower leg, Face     Bathing assist Assist Level: Maximal Assistance - Patient 24 - 49%     Upper Body Dressing/Undressing Upper body dressing   What is the patient wearing?: Pull over shirt    Upper body assist Assist Level: Maximal Assistance - Patient 25 - 49%    Lower Body Dressing/Undressing Lower body dressing      What is the patient wearing?: Pants     Lower body assist Assist for lower body dressing: Dependent - Patient 0%     Toileting Toileting    Toileting assist Assist for toileting: Dependent - Patient 0%     Transfers Chair/bed transfer  Transfers assist  Chair/bed transfer activity  did not occur: Safety/medical concerns  Chair/bed transfer assist level: Dependent - mechanical lift     Locomotion Ambulation   Ambulation assist   Ambulation activity did not occur: Safety/medical concerns          Walk 10 feet activity   Assist  Walk 10 feet activity did not occur: Safety/medical concerns        Walk 50 feet activity   Assist Walk 50 feet with 2 turns activity did not occur: Safety/medical concerns         Walk 150 feet activity   Assist Walk 150 feet activity did not occur: Safety/medical concerns         Walk 10 feet on uneven surface  activity   Assist Walk 10 feet on uneven surfaces activity did not occur: Safety/medical concerns         Wheelchair     Assist Is the patient using a wheelchair?: Yes Type of Wheelchair: Power Wheelchair activity did not  occur: Safety/medical concerns (limited tolerance for upright)  Wheelchair assist level: Set up assist Max wheelchair distance: 200'    Wheelchair 50 feet with 2 turns activity    Assist    Wheelchair 50 feet with 2 turns activity did not occur: Safety/medical concerns   Assist Level: Supervision/Verbal cueing   Wheelchair 150 feet activity     Assist  Wheelchair 150 feet activity did not occur: Safety/medical concerns   Assist Level: Supervision/Verbal cueing   Blood pressure 134/84, pulse 76, temperature 98.2 F (36.8 C), temperature source Oral, resp. rate 16, height 5' 8.5" (1.74 m), weight 73.2 kg, SpO2 97 %.  Medical Problem List and Plan: 1.  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 Pt also with lesion involving T1 vertebral body and right side posterior elements with associated pathologic vertebral fracture concerning for metastatic disease (?prostate) or other neoplasm. 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  Status post plan loop recorder  -turn q2-3 hours to reduce risk of pressure ulcers.  --family meeting done- also went over prognosis- I.e. 75% will be back of what HE gets back by 6 months- won't get more return after 18 months Con't PT and OT/CIR- getting movement in both legs- not antigravity so far.  9/6- con't PT and OT- and family training- wife needs to be comfortable with lovenox, bowel program and transfers- d/c 9/8- team conference today to go over everything.   9/8- d/c today- spoke with Barbara Cower from [facility] about hospital bed and 2 pronged plug as well as overlay air mattress. Also they are to pick up loaner w/c today.  2.  Impaired mobility: -DVT/anticoagulation: Venous Doppler studies negative.  Continue Lovenox Pharmaceutical: Lovenox  9/2- educated that needs lovenox x 3 months total.  9/8- going home on lovenox as above.               -antiplatelet  therapy: Continue Aspirin 81 mg daily and Plavix 75 mg day x3 weeks and aspirin alone 3. Pain: Tylenol   - Lidoderm patches 8pm to 8am -3 of them -kpad for shoulder pain, encouraged use of tylenol 9/4- pain controlled- continue regimen 4. Mood/depression/insomnia: Provide emotional support  Celexa 10 mg daily started on 8/12  8/27- will increase Celexa to 20 mg daily-   -continue melatonin for sleep  -neuropsych intervention would be helpful for him             -antipsychotic agents: N/A 5. Neuropsych: This patient is  capable of making decisions on his own behalf. 6. Skin/Wound Care: Routine skin checks 7. Fluids/Electrolytes/Nutrition: Routine in and outs 8.  Thrombocytopenia.  Resolved 9.  Neurogenic bowel bladder.  Foley catheter tube has been placed for multiple in and out catheterization Lidocaine jelly for bowel program.  8/15- con't Foley due to enlarged scrotum- would be hard to cath- likely won't void-  - bowel program- with suppository/dig stim prn per wife's request 8/23- bowel program last night- good results- still has foley due to enlarged scrotum, but will d/w pt/family more about chronic foley? 8/28- d/w wife/pt- they want to go with chronic foley since will make the stress less-  9/6- bowel program nightly- going well- con't regimen  9/8- wife trained 10.  Elevated PSA/enlarged prostate.  Family currently not committed to oncology work-up.  Family has asked [place] to not discuss cancer possibility while here   12. Scrotal swelling- will elevate and Hospitalist is checking pro-BNP and giving a dose of Lasix.   8/25- resolved Will monitor/follow.  13. Dysphagia- on D3 thin diet- will con't SLP for swallowing therapy.  14. Resp issues-  Flutter valve- also con't ICS if desired.  15. Leukocytosis-UTI Completed macrobid 16.  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   9/6- BP more reasonable today- con't to monitor= will need BP cuff at home.  18. Spasticity  8/24- pt doesn't want to increase spasticity meds as of yet.  19. Orthostatic hypotension  8/26- asked pt to push fluids- also spoke to head of therapy to get weighted cup and long straw so pt can drink without calling staff. Will also insist on TEDs- wait on abd binder? 20. Blanchable skin on sacrum  8/26- needs to be turned q2 hours when in bed- and foam dressing placed.   9/1- starting TEDs and ACE wraps- wait on meds 21. Nasal congestion  8/27- not clear if underlying or due to AD- but pt wants ot wait on Flonase- will con't nasal sline.  22. Access to water  8/28- d/w another SCI program- they suggest actually using O2 tubing and attaching to sip and puff and putting into weighted cup- LOWER than sip and puff so doesn't backflow onto pt- will try and arrange  9/2- has access to water 23. Dispo  9/6- suggest generator at home for hospital bed and w/c  Pt requires incontinence supplies due to neurogenic bowel/bowel incontinence as well as Neurogenic bladder and urinary retention with overflow incontinence- also needs Foley for urinary retention- - adult briefs, gloves and underpads/chux. Will need lifetime!   Pt requires a fully electric hospital bed due to quadriplegia- C5 Quad- he cannot sit up in bed on his own and needs the legs to elevate so can be pulled up in bed easier; also needs it to go up and down completely due to easier to transfer since wife/son have bad backs.  he is unable to do his own pressure relief in bed and needs air mattress overlay because he's at VERY high risk of developing skin breakdown/pressure ulcers- wife  also has  back issues, and needs hoyer lift to transfer him- he's 73 kg and she is completely unable to transfer a patient that cannot participate in his own transfers. He needs a power w/c with joystick modifications and tilt and space and recline due to spasticity and fluctuating LE edema and orthostatic hypotension.  Also elevating leg rests- actually has had to use sip and puff bed control at hospital to call nursing- also needs water apparatus so can drink independently, when didn't have easy access in hospital, required IVFs/got real dry.    I spent a total of 35 minutes on total care today >50% on coordination of care arranging for home d/c and equipment at home with vendor.    LOS: 28 days A FACE TO FACE EVALUATION WAS PERFORMED  Cobi Aldape 01/10/2021, 9:06 AM

## 2021-01-10 NOTE — Progress Notes (Signed)
[facility] ([facility])  Hospital Liaison RN note         Notified by TOC manager of patient/family request for [facility] Palliative services at home after discharge.              [facility] Palliative team will follow up with patient after discharge.         Please call with any hospice or palliative related questions.         Thank you for the opportunity to participate in this patient's care.     Chrislyn King, BSN, RN [facility] Hospital Liaison (listed on [facility] under Hospice/[facility])    [phone] (24h on call)    

## 2021-01-11 ENCOUNTER — Encounter (HOSPITAL_COMMUNITY): Payer: Self-pay

## 2021-01-11 ENCOUNTER — Telehealth: Payer: Self-pay | Admitting: *Deleted

## 2021-01-11 ENCOUNTER — Emergency Department (HOSPITAL_COMMUNITY)
Admission: EM | Admit: 2021-01-11 | Discharge: 2021-01-12 | Disposition: A | Discharge status: Home / Self Care | Payer: Medicare Other | Attending: Emergency Medicine | Admitting: Emergency Medicine

## 2021-01-11 DIAGNOSIS — Z7982 Long term (current) use of aspirin: Secondary | ICD-10-CM | POA: Insufficient documentation

## 2021-01-11 DIAGNOSIS — R310 Gross hematuria: Secondary | ICD-10-CM | POA: Insufficient documentation

## 2021-01-11 HISTORY — DX: Cerebral infarction, unspecified: I63.9

## 2021-01-11 LAB — URINALYSIS, ROUTINE W REFLEX MICROSCOPIC

## 2021-01-11 LAB — COMPREHENSIVE METABOLIC PANEL
ALT: 40 U/L (ref 0–44)
AST: 24 U/L (ref 15–41)
Albumin: 2.9 g/dL — ABNORMAL LOW (ref 3.5–5.0)
Alkaline Phosphatase: 70 U/L (ref 38–126)
Anion gap: 5 (ref 5–15)
BUN: 13 mg/dL (ref 8–23)
CO2: 25 mmol/L (ref 22–32)
Calcium: 9 mg/dL (ref 8.9–10.3)
Chloride: 107 mmol/L (ref 98–111)
Creatinine, Ser: 0.64 mg/dL (ref 0.61–1.24)
GFR, Estimated: >60 mL/min (ref >60)
Glucose, Bld: 96 mg/dL (ref 70–99)
Potassium: 3.9 mmol/L (ref 3.5–5.1)
Sodium: 137 mmol/L (ref 135–145)
Total Bilirubin: 0.9 mg/dL (ref 0.3–1.2)
Total Protein: 5.3 g/dL — ABNORMAL LOW (ref 6.5–8.1)

## 2021-01-11 LAB — CBC WITH DIFFERENTIAL/PLATELET
Abs Immature Granulocytes: 0.03 10*3/uL (ref 0.00–0.07)
Basophils Absolute: 0 10*3/uL (ref 0.0–0.1)
Basophils Relative: 1 %
Eosinophils Absolute: 0.1 10*3/uL (ref 0.0–0.5)
Eosinophils Relative: 1 %
HCT: 36.5 % — ABNORMAL LOW (ref 39.0–52.0)
Hemoglobin: 12.4 g/dL — ABNORMAL LOW (ref 13.0–17.0)
Immature Granulocytes: 1 %
Lymphocytes Relative: 8 %
Lymphs Abs: 0.5 10*3/uL — ABNORMAL LOW (ref 0.7–4.0)
MCH: 32 pg (ref 26.0–34.0)
MCHC: 34 g/dL (ref 30.0–36.0)
MCV: 94.3 fL (ref 80.0–100.0)
Monocytes Absolute: 0.6 10*3/uL (ref 0.1–1.0)
Monocytes Relative: 10 %
Neutro Abs: 5.2 10*3/uL (ref 1.7–7.7)
Neutrophils Relative %: 79 %
Platelets: 137 10*3/uL — ABNORMAL LOW (ref 150–400)
RBC: 3.87 MIL/uL — ABNORMAL LOW (ref 4.22–5.81)
RDW: 13.9 % (ref 11.5–15.5)
WBC: 6.5 10*3/uL (ref 4.0–10.5)
nRBC: 0 % (ref 0.0–0.2)

## 2021-01-11 LAB — URINALYSIS, MICROSCOPIC (REFLEX)
RBC / HPF: >50 RBC/hpf (ref 0–5)
Squamous Epithelial / HPF: NONE SEEN (ref 0–5)

## 2021-01-11 LAB — PROTIME-INR
INR: 1.1 (ref 0.8–1.2)
Prothrombin Time: 13.9 seconds (ref 11.4–15.2)

## 2021-01-11 MED ORDER — CEPHALEXIN 500 MG PO CAPS: 500.0000 mg | ORAL_CAPSULE | Freq: Four times a day (QID) | ORAL | 0 refills | Status: AC

## 2021-01-11 MED ORDER — CEPHALEXIN 250 MG PO CAPS
500.0000 mg | ORAL_CAPSULE | Freq: Once | ORAL | Status: AC
Start: 2021-01-11 — End: 2021-01-11
  Administered 2021-01-11: 500 mg via ORAL
  Filled 2021-01-11: qty 2

## 2021-01-11 NOTE — ED Notes (Signed)
Pt repositioned and turned at this time

## 2021-01-11 NOTE — Discharge Instructions (Addendum)
You were seen in the ER today for the blood in the urine. Your blood work was reassuring. It is possible the bleeding you are experiencing is related to your blood thinning medication called Lovenox. Please DO NOT GIVE this medication for the next three days. Please discuss continuing Lovenox with your doctor's office on Monday.   It is also possible you have a urinary tract infection, which is contributing to your bleeding. For this reason you have been started on an antibiotic called cephalexin. Please take this as prescribed for the entire course. Your urine is being tested further over the next few days to further evaluate for infection.   Return to the ER with any new chest pain, difficulty breathing, palpitations, increase in output of blood from the catheter, or any other new severe symptoms.

## 2021-01-11 NOTE — ED Notes (Signed)
[facility] was called for transport 

## 2021-01-11 NOTE — ED Triage Notes (Signed)
Pt comes from home via EMS. Pt reports admission about 1 month ago for stroke with discharge yesterday. Pt started on blood thinner while admitted. Foley placed 2 days ago prior to discharge, Pt woke this morning with blood in foley. Foley bag emptied prior to EMS arrival. EMS notes small amount of blood in bag at this time. Pt unable to move from waist down from stroke, Has some movement in upper extremities. Pt a/o x4 denies pain. Pt has Holter monitor. BP 155/77 HR 67 O2 97% on RA

## 2021-01-11 NOTE — Care Management (Signed)
ED RN Care Manager spoke with patient's wife updated her on additional [facility] services with [facility]. Informed her that a nurse from [facility] will contact her about arranging the initial visit. Spouse verbalized understanding and was agreeable.  Patient still awaiting transport by [facility] wife also updated.

## 2021-01-11 NOTE — Telephone Encounter (Signed)
[name] called and reported that [name] had to be taken back to the ED due to blood in his urine. Dr Berline Chough notified.

## 2021-01-11 NOTE — ED Notes (Signed)
Pt asked for me to call wife [name] and let her know that he is still waiting for transport. [phone]. Reached wife and made aware pt  is still waiting for [facility]

## 2021-01-11 NOTE — ED Notes (Signed)
Receive verbal report from Veverly Fells RN at this time

## 2021-01-11 NOTE — ED Provider Notes (Signed)
[facility] EMERGENCY DEPARTMENT Provider Note   CSN: [id number] Arrival date & time: 01/11/21  9735     History No chief complaint on file.   [name] is a 75 y.o. male who was just discharged from the hospital yesterday after admission for CVA since early August after presenting with weakness and quadriplegia since 8/5.  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  He presents today with concern for significant output of patient was just discharged home yesterday, and states that he woke this morning with bright red blood in the Foley catheter bag.  He has been on Lovenox since his admission patient did have loop recorder going on 8/11 due to cardiology concern for cardioembolic etiology of stroke.  Plan is for him to be on Lovenox until November, then transition to aspirin and Plavix according to the patient.  I personally reviewed this patient's medical records.  He has history of CVA with resulting quadriplegia and decree sensation below the umbilicus.  HPI     Past Medical History:  Diagnosis Date  . CVA (cerebral vascular accident) [facility])     Patient Active Problem List   Diagnosis Date Noted  . Labile blood pressure   . Acute lower UTI   . Dysphagia 12/14/2020  . Quadriplegia (HCC) 12/13/2020  . Neurogenic bowel 12/13/2020  . Neurogenic bladder 12/13/2020  . Scrotal swelling 12/13/2020  . Foley catheter in place on admission 12/13/2020  . CVA (cerebral vascular accident) (HCC) 12/08/2020  . Acute CVA (cerebrovascular accident) (HCC) 12/07/2020  . Abnormal MRI 12/07/2020  . Sinus bradycardia seen on cardiac monitor 12/07/2020    Past Surgical History:  Procedure Laterality Date  . LOOP RECORDER INSERTION N/A 12/13/2020   Procedure: LOOP RECORDER INSERTION;   Surgeon: Lanier Prude, MD;  Location: [facility] INVASIVE CV LAB;  Service: Cardiovascular;  Laterality: N/A;  . TONSILLECTOMY         Family History  Problem Relation Age of Onset  . Cancer Mother   . Dementia Mother   . Heart attack Father   . Clotting disorder Father     Social History   Tobacco Use  . Smoking status: Never  . Smokeless tobacco: Never  Vaping Use  . Vaping Use: Never used  Substance Use Topics  . Alcohol use: Not Currently  . Drug use: Never    Home Medications Prior to Admission medications   Medication Sig Start Date End Date Taking? Authorizing Provider  cephALEXin (KEFLEX) 500 MG capsule Take 1 capsule (500 mg total) by mouth 4 (four) times daily for 5 days. 01/11/21 01/16/21 Yes Shantice Menger, Eugene Gavia, PA-C  acetaminophen (TYLENOL) 325 MG tablet Take 2 tablets (650 mg total) by mouth every 4 (four) hours as needed for mild pain (or temp > 37.5 C (99.5 F)). 01/09/21   Angiulli, Mcarthur Rossetti, PA-C  Ascorbic Acid (VITAMIN C PO) Take 1 tablet by mouth daily.    [provider]  aspirin EC 81 MG EC tablet Take 1 tablet (81 mg total) by mouth daily. Swallow whole. 12/14/20   Zigmund Daniel., MD  atorvastatin (LIPITOR) 80 MG tablet Take 1 tablet (80 mg total) by mouth daily. 01/09/21 02/08/21  Angiulli, Mcarthur Rossetti, PA-C  Baclofen 5 MG TABS Take 5 mg by mouth 3 (three) times daily. 01/09/21   Angiulli, Mcarthur Rossetti, PA-C  bisacodyl (DULCOLAX) 10 MG suppository Place 1 suppository (10 mg total) rectally daily at 8 pm. 01/09/21   Angiulli, Mcarthur Rossetti, PA-C  Cholecalciferol (VITAMIN D3 PO) Take 1 tablet by mouth daily.    [provider]  citalopram (CELEXA) 20 MG tablet Take 1 tablet (20 mg total) by mouth daily. 01/09/21   Angiulli, Mcarthur Rossetti, PA-C  Cyanocobalamin (VITAMIN B-12 PO) Take 1 drop by mouth daily.    [provider]  enoxaparin (LOVENOX) 40 MG/0.4ML injection Plan Lovenox 40 mg daily through 03/15/2021 and stop 01/10/21 03/15/21  Angiulli, Mcarthur Rossetti,  PA-C  lidocaine (LIDODERM) 5 % Place 3 patches onto the skin daily. Remove & Discard patch within 12 hours or as directed by MD 01/09/21   Angiulli, Mcarthur Rossetti, PA-C  Multiple Vitamin (MULTIVITAMIN WITH MINERALS) TABS tablet Take 1 tablet by mouth daily.    [provider]  pantoprazole (PROTONIX) 40 MG tablet Take 1 tablet (40 mg total) by mouth daily. 01/09/21 02/08/21  Angiulli, Mcarthur Rossetti, PA-C    Allergies    Patient has no known allergies.  Review of Systems   Review of Systems  Constitutional: Negative.   HENT: Negative.    Eyes: Negative.   Respiratory: Negative.    Cardiovascular: Negative.   Gastrointestinal: Negative.   Genitourinary:  Positive for hematuria. Negative for decreased urine volume, flank pain and genital sores.       Chronic indwelling foley   Musculoskeletal: Negative.   Neurological: Negative.    Physical Exam Updated Vital Signs BP 132/62 (BP Location: Right Arm)   Pulse 62   Temp 98.5 F (36.9 C) (Oral)   Resp 20   Ht 5' 8.5" (1.74 m)   Wt 72.6 kg   SpO2 96%   BMI 23.97 kg/m   Physical Exam Vitals and nursing note reviewed. Exam conducted with a chaperone present.  Constitutional:      Appearance: He is normal weight. He is not toxic-appearing.  HENT:     Head: Normocephalic and atraumatic.     Nose: Nose normal.     Mouth/Throat:     Mouth: Mucous membranes are moist.     Pharynx: Oropharynx is clear. Uvula midline. No oropharyngeal exudate or posterior oropharyngeal erythema.     Tonsils: No tonsillar exudate.  Eyes:     General: Lids are normal. Vision grossly intact.        Right eye: No discharge.        Left eye: No discharge.     Extraocular Movements: Extraocular movements intact.     Conjunctiva/sclera: Conjunctivae normal.     Pupils: Pupils are equal, round, and reactive to light.  Neck:     Trachea: Trachea and phonation normal.  Cardiovascular:     Rate and Rhythm: Normal rate and regular rhythm.     Pulses: Normal  pulses.     Heart sounds: Normal heart sounds.  Pulmonary:     Effort: Pulmonary effort is normal. No tachypnea, bradypnea, accessory muscle usage, prolonged expiration or respiratory distress.     Breath sounds: Normal breath sounds. No wheezing or rales.  Chest:     Chest wall: No mass, tenderness, crepitus or edema.    Abdominal:     General: Bowel sounds are normal. There is no distension.     Palpations: Abdomen is soft.     Tenderness: There is no abdominal tenderness.     Comments: Evaluation of tenderness limited due to patient's decreased sensation in the abdomen  following CVA.  Genitourinary:    Penis: Normal and circumcised.      Testes: Normal.     Comments: Catheter in place in the urethra without evidence of trauma.  Dark red hematuria in the Foley bag. Musculoskeletal:        General: No deformity.     Cervical back: Normal range of motion and neck supple. No edema, rigidity or crepitus. No pain with movement, spinous process tenderness or muscular tenderness.     Right lower leg: No edema.     Left lower leg: No edema.  Lymphadenopathy:     Cervical: No cervical adenopathy.  Skin:    General: Skin is warm and dry.     Capillary Refill: Capillary refill takes less than 2 seconds.  Neurological:     Mental Status: He is alert and oriented to person, place, and time. Mental status is at baseline.  Psychiatric:        Mood and Affect: Mood normal.    ED Results / Procedures / Treatments   Labs (all labs ordered are listed, but only abnormal results are displayed) Labs Reviewed  COMPREHENSIVE METABOLIC PANEL - Abnormal; Notable for the following components:      Result Value   Total Protein 5.3 (*)    Albumin 2.9 (*)    All other components within normal limits  CBC WITH DIFFERENTIAL/PLATELET - Abnormal; Notable for the following components:   RBC 3.87 (*)    Hemoglobin 12.4 (*)    HCT 36.5 (*)    Platelets 137 (*)    Lymphs Abs 0.5 (*)    All other  components within normal limits  URINALYSIS, ROUTINE W REFLEX MICROSCOPIC - Abnormal; Notable for the following components:   Color, Urine RED (*)    APPearance TURBID (*)    Glucose, UA   (*)    Value: TEST NOT REPORTED DUE TO COLOR INTERFERENCE OF URINE PIGMENT   Hgb urine dipstick   (*)    Value: TEST NOT REPORTED DUE TO COLOR INTERFERENCE OF URINE PIGMENT   Bilirubin Urine   (*)    Value: TEST NOT REPORTED DUE TO COLOR INTERFERENCE OF URINE PIGMENT   Ketones, ur   (*)    Value: TEST NOT REPORTED DUE TO COLOR INTERFERENCE OF URINE PIGMENT   Protein, ur   (*)    Value: TEST NOT REPORTED DUE TO COLOR INTERFERENCE OF URINE PIGMENT   Nitrite   (*)    Value: TEST NOT REPORTED DUE TO COLOR INTERFERENCE OF URINE PIGMENT   Leukocytes,Ua   (*)    Value: TEST NOT REPORTED DUE TO COLOR INTERFERENCE OF URINE PIGMENT   All other components within normal limits  URINALYSIS, MICROSCOPIC (REFLEX) - Abnormal; Notable for the following components:   Bacteria, UA RARE (*)    All other components within normal limits  URINE CULTURE  PROTIME-INR    EKG None  Radiology No results found.  Procedures Procedures   Medications Ordered in ED Medications  cephALEXin (KEFLEX) capsule 500 mg (500 mg Oral Given 01/11/21 1426)    ED Course  I have reviewed the triage vital signs and the nursing notes.  Pertinent labs & imaging results that were available during my care of the patient were reviewed by me and considered in my medical decision making (see chart for details).  Clinical Course as of 01/11/21 1530  Fri Jan 11, 2021  1529 Case discussed with transitions of care RN Rosezena Sensor, who is going  to modify orders for home health nursing.  I appreciate her collaboration in the care of this patient. [RS]    Clinical Course User Index [RS] Hershey Knauer, Idelia Salmebekah R, PA-C   MDM Rules/Calculators/A&P                         75 year old male with quadriplegia and chronic indwelling Foley  catheter presents with gross hematuria since this morning.  He is anticoagulated on Lovenox.  Differential diagnosis includes but to urethral trauma, urinary tract infection, nephrolithiasis, malignancy, prostatitis, interstitial nephritis or pyelonephritis, renal trauma, anticoagulant usage, thrombocytopenia.  Vital signs are normal on intake.  Cardiopulmonary exam is normal, abdominal exam is benign.  Patient is vascularly intact in all 4 extremities and his neurologic baseline.  Cognitively patient is intact pleasant and conversant with this provider.  CBC with mild anemia with hemoglobin of 12.4, near patient's baseline.  No leukocytosis.  CMP with Hypoalbuminemia to 2.9, otherwise unremarkable with normal creatinine and LFTs.  Coag studies with normal INR of 1.1.  UA red, turbid, unable to assess other factors on urinalysis due to gross hematuria.  Patient remains normal and his vital signs throughout stay in the emergency department.  Differential diagnosis remains drug effect from anticoagulation versus hemorrhagic cystitis.  Patient was treated for UTI with Macrobid while admitted over the last month, will discharge with cephalexin and urine culture pending at this time.  Recommend holding Lovenox for the next 72 hours and discussing with prescribing provider role of continued usage of Lovenox for DVT prophylaxis in this patient.  I discussed [name]'s emergency department medical evaluation and treatment plan with both him at the bedside and his wife over the phone.  They each voiced understanding of his medical evaluation and treatment plan.  Each of their questions was answered to their expressed satisfaction.  Strict return precautions were given.  Patient is well-appearing, stable, and appropriate for discharge at this time.  This chart was dictated using voice recognition software, Dragon. Despite the best efforts of this provider to proofread and correct errors, errors may still occur which  can change documentation meaning.   Final Clinical Impression(s) / ED Diagnoses Final diagnoses:  Gross hematuria    Rx / DC Orders ED Discharge Orders          Ordered    cephALEXin (KEFLEX) 500 MG capsule  4 times daily        01/11/21 1407             Darrel Baroni, Eugene GaviaRebekah R, PA-C 01/11/21 1528    Cathren LaineSteinl, Kevin, MD 01/11/21 [id number]

## 2021-01-12 NOTE — ED Notes (Signed)
Verbal report given to [employer] RN at this time

## 2021-01-12 NOTE — ED Notes (Signed)
Pt moved to ED 6 at this time

## 2021-01-14 ENCOUNTER — Telehealth: Payer: Self-pay

## 2021-01-14 DIAGNOSIS — G8250 Quadriplegia, unspecified: Secondary | ICD-10-CM

## 2021-01-14 LAB — URINE CULTURE: Culture: >=100000 — AB

## 2021-01-14 NOTE — Telephone Encounter (Signed)
Patient wife called stating he went to the ED and Lovenox was suspended. She wants to know when he needs to start back on it. Also she needs an order for [facility] nurse aide.

## 2021-01-15 ENCOUNTER — Telehealth: Payer: Self-pay

## 2021-01-15 NOTE — Telephone Encounter (Signed)
Post ED Visit - Positive Culture Follow-up: Successful Patient Follow-Up  Culture assessed and recommendations reviewed by:  [x]  , Pharm.D BCCCP []  Loleta Dicker, .D., BCPS AQ-ID []  Celedonio Miyamoto, Pharm.D., BCPS []  [address], Pharm.D., BCPS []  [place], Garvin Fila.D., BCPS, AAHIVP []  , Pharm.D., BCPS, AAHIVP []  Georgina Pillion, PharmD, BCPS []  , PharmD, BCPS []  [place], PharmD, BCPS []  [address], PharmD  Positive urine culture  []  Patient discharged without antimicrobial prescription and treatment is now indicated [x]  Organism is resistant to prescribed ED discharge antimicrobial []  Patient with positive blood cultures  Changes discussed with ED provider: , PA New antibiotic prescription Ciprofloxacin 500 mg po BID x 7 days  Called to [id number] [address],  Contacted patient caregiver/wife [name], date 01/15/2021, time 10:25 am   [name] 01/15/2021, 10:27 AM

## 2021-01-15 NOTE — Telephone Encounter (Signed)
I can put in the referral for [facility] aide. I just need your ok to do so.

## 2021-01-15 NOTE — Progress Notes (Signed)
ED Antimicrobial Stewardship Positive Culture Follow Up   [name] is an 75 y.o. male who presented to [facility] on 01/11/2021 with a chief complaint of No chief complaint on file.   Recent Results (from the past 720 hour(s))  Urine Culture     Status: Abnormal   Collection Time: 01/11/21 10:59 AM   Specimen: Urine, Catheterized  Result Value Ref Range Status   Specimen Description URINE, CATHETERIZED  Final   Special Requests   Final    NONE Performed at [facility], [address] [place], [place] [address]    Culture >=100,000 COLONIES/mL PSEUDOMONAS AERUGINOSA (A)  Final   Report Status 01/14/2021 FINAL  Final   Organism ID, Bacteria PSEUDOMONAS AERUGINOSA (A)  Final      Susceptibility   Pseudomonas aeruginosa - MIC*    CEFTAZIDIME 2 SENSITIVE Sensitive     CIPROFLOXACIN <=0.25 SENSITIVE Sensitive     GENTAMICIN <=1 SENSITIVE Sensitive     IMIPENEM 2 SENSITIVE Sensitive     PIP/TAZO <=4 SENSITIVE Sensitive     CEFEPIME 1 SENSITIVE Sensitive     * >=100,000 COLONIES/mL PSEUDOMONAS AERUGINOSA   [x]  Treated with cephalexin, organism resistant to prescribed antimicrobial []  Patient discharged originally without antimicrobial agent and treatment is now indicated  New antibiotic prescription: Discontinue cephalexin and prescribe Ciprofloxacin 500mg  PO BID x 7 days  ED Provider: , PA-C  , PharmD, [facility] Emergency Medicine Clinical Pharmacist ED RPh Phone: [phone] Main RX: [phone]

## 2021-01-16 ENCOUNTER — Telehealth: Payer: Self-pay

## 2021-01-16 NOTE — Telephone Encounter (Signed)
SW received message on 9/13 about an aide request. SW received updates from Cheryl/[facility] who reported pt will receive OT assistant to assist with self care needs 1-2xs per week.   SW called pt wife [name] to inform that an aide was initiated on order already. SW explained above about aide. No further questions/concerns at this time.   Case closed to SW.

## 2021-01-16 NOTE — Telephone Encounter (Signed)
Hospital note reviewed per protocol: In home physical therapy  okay given for  twice a week for one week, three times a week for 2 weeks, then twice a week for 5 weeks. Also for a Presenter, broadcasting for available community services education. (Call back phone [phone]).

## 2021-01-17 NOTE — Telephone Encounter (Signed)
done

## 2021-01-21 ENCOUNTER — Ambulatory Visit (INDEPENDENT_AMBULATORY_CARE_PROVIDER_SITE_OTHER): Payer: Medicare Other

## 2021-01-21 DIAGNOSIS — I6340 Cerebral infarction due to embolism of unspecified cerebral artery: Secondary | ICD-10-CM | POA: Diagnosis not present

## 2021-01-21 LAB — CUP PACEART REMOTE DEVICE CHECK
Date Time Interrogation Session: 20220918162446
Implantable Pulse Generator Implant Date: 20220811

## 2021-01-25 NOTE — Progress Notes (Signed)
Carelink Summary Report / Loop Recorder 

## 2021-02-01 ENCOUNTER — Encounter: Payer: Medicare Other | Admitting: Registered Nurse

## 2021-02-04 DIAGNOSIS — G8250 Quadriplegia, unspecified: Secondary | ICD-10-CM

## 2021-02-04 DIAGNOSIS — G9511 Acute infarction of spinal cord (embolic) (nonembolic): Secondary | ICD-10-CM

## 2021-02-04 DIAGNOSIS — F32A Depression, unspecified: Secondary | ICD-10-CM

## 2021-02-04 DIAGNOSIS — K592 Neurogenic bowel, not elsewhere classified: Secondary | ICD-10-CM

## 2021-02-04 DIAGNOSIS — M8448XD Pathological fracture, other site, subsequent encounter for fracture with routine healing: Secondary | ICD-10-CM

## 2021-02-04 DIAGNOSIS — N319 Neuromuscular dysfunction of bladder, unspecified: Secondary | ICD-10-CM

## 2021-02-04 DIAGNOSIS — G959 Disease of spinal cord, unspecified: Secondary | ICD-10-CM

## 2021-02-04 DIAGNOSIS — I951 Orthostatic hypotension: Secondary | ICD-10-CM

## 2021-02-04 DIAGNOSIS — R1310 Dysphagia, unspecified: Secondary | ICD-10-CM

## 2021-02-04 DIAGNOSIS — G904 Autonomic dysreflexia: Secondary | ICD-10-CM

## 2021-02-04 DIAGNOSIS — Z466 Encounter for fitting and adjustment of urinary device: Secondary | ICD-10-CM

## 2021-02-04 DIAGNOSIS — R339 Retention of urine, unspecified: Secondary | ICD-10-CM

## 2021-02-05 ENCOUNTER — Telehealth: Payer: Self-pay | Admitting: *Deleted

## 2021-02-05 NOTE — Telephone Encounter (Signed)
Monica RN [facility] called to report a stage 2 wound to his coccyx and needs orders.

## 2021-02-06 NOTE — Telephone Encounter (Signed)
She recommended zinc to wound and cover with optifoam dressing. Family has been instructed about position changes.

## 2021-02-18 ENCOUNTER — Telehealth: Payer: Self-pay

## 2021-02-18 NOTE — Telephone Encounter (Signed)
Received a call from [facility] at [facility]. She needs office notes that have that the patient has urinary retention. Called and spoke with Maralyn Sago and informed her that the patient does not have an appointment in our clinic until 03/18/21. Informed her that we have no notes in the patient's chart and orders cannot be signed until he is seen in the clinic.

## 2021-02-25 ENCOUNTER — Ambulatory Visit (INDEPENDENT_AMBULATORY_CARE_PROVIDER_SITE_OTHER): Payer: Medicare Other

## 2021-02-25 DIAGNOSIS — I6340 Cerebral infarction due to embolism of unspecified cerebral artery: Secondary | ICD-10-CM

## 2021-02-25 LAB — CUP PACEART REMOTE DEVICE CHECK
Date Time Interrogation Session: 20221021162641
Implantable Pulse Generator Implant Date: 20220811

## 2021-03-05 ENCOUNTER — Telehealth: Payer: Self-pay | Admitting: Cardiology

## 2021-03-05 ENCOUNTER — Telehealth: Payer: Self-pay

## 2021-03-05 NOTE — Telephone Encounter (Signed)
Left message for pt to call back on both phone numbers listed in pt's chart.

## 2021-03-05 NOTE — Telephone Encounter (Signed)
Pt c/o BP issue: STAT if pt c/o blurred vision, one-sided weakness or slurred speech  1. What are your last 5 BP readings? 82/52, 90/50, 120/60 when she saw him  2. Are you having any other symptoms (ex. Dizziness, headache, blurred vision, passed out)? Turns white and his lips turn purple, Quadriplegic  3. What is your BP issue? [name], Home Health nurse states the patient's BP has been low. She states they are not sure what caused his stroke like appearance. She would like a nurse to call the patient back.

## 2021-03-05 NOTE — Progress Notes (Signed)
Carelink Summary Report / Loop Recorder 

## 2021-03-05 NOTE — Telephone Encounter (Addendum)
Jasmine December RN the home health nurse called:  Lorraine Lax blood pressure  bottoms out with therapy visits.  Reading have been 88/50,120/60, 108/60, 88/52.  He has a new blood blister on his lower right leg.   Call back phone [phone].  Caller is aware you are not in the office and  advised to call the Cardiology for advice.  Patient has no PCP.

## 2021-03-06 MED ORDER — FLUDROCORTISONE ACETATE 0.1 MG PO TABS: 0.1000 mg | ORAL_TABLET | Freq: Every day | ORAL | 5 refills | Status: DC

## 2021-03-06 NOTE — Telephone Encounter (Signed)
Spoke with patient and his wife.  She states they received a call from another nurse at [facility] advising to contact his PCP for management of BP and if necessary referral.    Spouse was thankful for the follow-up and advised they are contacting a PCP now.

## 2021-03-06 NOTE — Telephone Encounter (Signed)
I spoke with the patient and his wife. I let them know the password is [id number] to get into the phone. I let them know that the [employer] rep set the phone up at the hospital and it has been working properly. All he has to do is sleep by the mobile monitor. They do not need to do anything at this time with the mobile phone. She asked me does the loop record his blood pressure. I let her know that the loop does not record blood pressure. Blood pressure is in the pumping function of the heart. The loop only records arrhythmias in the electrical component of the heart. The patient and his wife verbalized understanding and thanked me for the call.

## 2021-03-06 NOTE — Addendum Note (Signed)
Addended by: Genice Rouge on: 03/06/2021 09:29 AM   Modules accepted: Orders

## 2021-03-06 NOTE — Telephone Encounter (Signed)
Spoke with pt and pt's wife who reports pt is quadriplegic and has issues with BP dropping especially if he gets up to his chair.  Pt currently unable to get up to chair d/t "bed sore" of coccyx.  He is receiving home health services.  Pt does not have PCP.  Pt does have neurology appointment with Dr Pearlean Brownie 04/03/2021.  Provided PCP hotline number to pt's wife to establish care.  Encouraged pt to keep appointment with neurology.  Pt's wife with questions related to loop recorder.  She is asking what she is supposed to do with phone provided.  She states there were no box or instructions provided by the hospital.   Will forward to device clinic to contact pt and pt's wife re: LINQ.  Pt and pt's wife verbalizes understanding and agrees with current plan.

## 2021-03-07 NOTE — Telephone Encounter (Signed)
Pt will follow with Phys [facility] for BP management d/t this is related to his neurological status per Dr. Lalla Brothers.

## 2021-03-13 ENCOUNTER — Telehealth: Payer: Self-pay

## 2021-03-13 NOTE — Telephone Encounter (Signed)
Virl Axe with [facility] called for permission for in-home visits once a week for 4 weeks. Verbal okay given.

## 2021-03-14 ENCOUNTER — Other Ambulatory Visit: Payer: Self-pay

## 2021-03-14 ENCOUNTER — Telehealth: Payer: Self-pay

## 2021-03-14 ENCOUNTER — Emergency Department (HOSPITAL_BASED_OUTPATIENT_CLINIC_OR_DEPARTMENT_OTHER)
Admission: EM | Admit: 2021-03-14 | Discharge: 2021-03-15 | Disposition: A | Discharge status: Home / Self Care | Payer: Medicare Other | Attending: Emergency Medicine | Admitting: Emergency Medicine

## 2021-03-14 ENCOUNTER — Encounter (HOSPITAL_BASED_OUTPATIENT_CLINIC_OR_DEPARTMENT_OTHER): Payer: Self-pay

## 2021-03-14 DIAGNOSIS — T839XXA Unspecified complication of genitourinary prosthetic device, implant and graft, initial encounter: Secondary | ICD-10-CM

## 2021-03-14 DIAGNOSIS — T83021A Displacement of indwelling urethral catheter, initial encounter: Secondary | ICD-10-CM | POA: Diagnosis not present

## 2021-03-14 DIAGNOSIS — T83098A Other mechanical complication of other indwelling urethral catheter, initial encounter: Secondary | ICD-10-CM | POA: Diagnosis present

## 2021-03-14 DIAGNOSIS — R39198 Other difficulties with micturition: Secondary | ICD-10-CM | POA: Insufficient documentation

## 2021-03-14 DIAGNOSIS — Z7982 Long term (current) use of aspirin: Secondary | ICD-10-CM | POA: Insufficient documentation

## 2021-03-14 NOTE — ED Triage Notes (Signed)
Pt brought in by [employer] . Reports home health nurse was changing foley catheter approximately 3 1/2 hours ago. No urine return and then was unable to deflate balloon.

## 2021-03-14 NOTE — ED Provider Notes (Signed)
[facility] EMERGENCY DEPARTMENT Provider Note   CSN: [id number] Arrival date & time: 03/14/21  1751     History Chief Complaint  Patient presents with   Foley Obstruction    [name] is a 75 y.o. male.  75 yo M with a chief complaint of Foley catheter issue.  Patient had his Foley changed today as per his normal schedule and he had no output.  The home health nurse tried to remove it but was unable to deflate the balloon and decided to send him here for evaluation.  Patient feels like he is not been able to urinate and feels somewhat uncomfortable.  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  Has some limited sensation but does feel like something is wrong there.  The history is provided by the patient.  Illness Severity:  Moderate Onset quality:  Gradual Duration:  2 hours Timing:  Constant Progression:  Worsening Chronicity:  New Associated symptoms: no abdominal pain, no chest pain, no congestion, no diarrhea, no fever, no headaches, no myalgias, no rash, no shortness of breath and no vomiting       Past Medical History:  Diagnosis Date   CVA (cerebral vascular accident) [facility])     Patient Active Problem List   Diagnosis Date Noted   Labile blood pressure    Acute lower UTI    Dysphagia 12/14/2020   Quadriplegia ([place]) 12/13/2020   Neurogenic bowel 12/13/2020   Neurogenic bladder 12/13/2020   Scrotal swelling 12/13/2020   Foley catheter in place on admission 12/13/2020   CVA (cerebral vascular accident) ([place]) 12/08/2020   Acute CVA (cerebrovascular accident) ([place]) 12/07/2020   Abnormal MRI 12/07/2020   Sinus bradycardia seen on cardiac monitor 12/07/2020    Past Surgical History:  Procedure Laterality Date   LOOP RECORDER INSERTION N/A 12/13/2020   Procedure: LOOP RECORDER INSERTION;  Surgeon: [name], MD;  Location: [facility];  Service: Cardiovascular;  Laterality: N/A;   TONSILLECTOMY         Family History   Problem Relation Age of Onset   Cancer Mother    Dementia Mother    Heart attack Father    Clotting disorder Father     Social History   Tobacco Use   Smoking status: Never   Smokeless tobacco: Never  Vaping Use   Vaping Use: Never used  Substance Use Topics   Alcohol use: Not Currently   Drug use: Never    Home Medications Prior to Admission medications   Medication Sig Start Date End Date Taking? Authorizing Provider  acetaminophen (TYLENOL) 325 MG tablet Take 2 tablets (650 mg total) by mouth every 4 (four) hours as needed for mild pain (or temp > 37.5 C (99.5 F)). 01/09/21   Angiulli, Lavon Paganini, PA-C  Ascorbic Acid (VITAMIN C PO) Take 1 tablet by mouth daily.    [provider]  aspirin EC 81 MG EC tablet Take 1 tablet (81 mg total) by mouth daily. Swallow whole. 12/14/20   [name]., MD  atorvastatin (LIPITOR) 80 MG tablet Take 1 tablet (80 mg total) by mouth daily. 01/09/21 02/08/21  Angiulli, Lavon Paganini, PA-C  Baclofen 5 MG TABS Take 5 mg by mouth 3 (three) times daily. 01/09/21   Angiulli, Lavon Paganini, PA-C  bisacodyl (DULCOLAX) 10 MG suppository Place 1 suppository (10 mg total) rectally daily at 8 pm. 01/09/21   Angiulli, Lavon Paganini, PA-C  Cholecalciferol (VITAMIN D3 PO) Take 1 tablet by  mouth daily.    [provider]  citalopram (CELEXA) 20 MG tablet Take 1 tablet (20 mg total) by mouth daily. 01/09/21   Angiulli, Mcarthur Rossetti, PA-C  Cyanocobalamin (VITAMIN B-12 PO) Take 1 drop by mouth daily.    [provider]  enoxaparin (LOVENOX) 40 MG/0.4ML injection Plan Lovenox 40 mg daily through 03/15/2021 and stop 01/10/21 03/15/21  Angiulli, Mcarthur Rossetti, PA-C  fludrocortisone (FLORINEF) 0.1 MG tablet Take 1 tablet (0.1 mg total) by mouth daily. For orthostatic hypotension/low BP 03/06/21   Lovorn, Aundra Millet, MD  lidocaine (LIDODERM) 5 % Place 3 patches onto the skin daily. Remove & Discard patch within 12 hours or as directed by MD 01/09/21   Angiulli, Mcarthur Rossetti, PA-C   Multiple Vitamin (MULTIVITAMIN WITH MINERALS) TABS tablet Take 1 tablet by mouth daily.    [provider]  pantoprazole (PROTONIX) 40 MG tablet Take 1 tablet (40 mg total) by mouth daily. 01/09/21 02/08/21  Angiulli, Mcarthur Rossetti, PA-C    Allergies    Patient has no known allergies.  Review of Systems   Review of Systems  Constitutional:  Negative for chills and fever.  HENT:  Negative for congestion and facial swelling.   Eyes:  Negative for discharge and visual disturbance.  Respiratory:  Negative for shortness of breath.   Cardiovascular:  Negative for chest pain and palpitations.  Gastrointestinal:  Negative for abdominal pain, diarrhea and vomiting.  Genitourinary:  Positive for difficulty urinating.  Musculoskeletal:  Negative for arthralgias and myalgias.  Skin:  Negative for color change and rash.  Neurological:  Negative for tremors, syncope and headaches.  Psychiatric/Behavioral:  Negative for confusion and dysphoric mood.    Physical Exam Updated Vital Signs BP 109/60   Pulse 77   Temp 98.8 F (37.1 C) (Oral)   Resp 18   SpO2 97%   Physical Exam Vitals and nursing note reviewed.  Constitutional:      Appearance: He is well-developed.  HENT:     Head: Normocephalic and atraumatic.  Eyes:     Pupils: Pupils are equal, round, and reactive to light.  Neck:     Vascular: No JVD.  Cardiovascular:     Rate and Rhythm: Normal rate and regular rhythm.     Heart sounds: No murmur heard.   No friction rub. No gallop.  Pulmonary:     Effort: No respiratory distress.     Breath sounds: No wheezing.  Abdominal:     General: There is no distension.     Tenderness: There is no abdominal tenderness. There is no guarding or rebound.     Comments: Palpable bladder.  Musculoskeletal:        General: Normal range of motion.     Cervical back: Normal range of motion and neck supple.  Skin:    Coloration: Skin is not pale.     Findings: No rash.  Neurological:      Mental Status: He is alert and oriented to person, place, and time.  Psychiatric:        Behavior: Behavior normal.    ED Results / Procedures / Treatments   Labs (all labs ordered are listed, but only abnormal results are displayed) Labs Reviewed - No data to display  EKG None  Radiology No results found.  Procedures Procedures  Emergency Focused Ultrasound Exam Limited ultrasound of the Bladder.   Performed and interpreted by Dr. Adela Lank Indication: evaluation for urinary retention Transverse and Sagittal views of the bladder are obtained  and calculations are performed to determine an estimated bladder volume for signs of post-renal obstruction.  Findings: Bladder is 700ccfull no Foley Bulb visualized in the bladder.  A balloon is visualized closer to where I expect the prostate. Interpretation:  evidence of outlet obstruction Images archived electronically.  CPT Codes: [phone] and [phone]  Medications Ordered in ED Medications - No data to display  ED Course  I have reviewed the triage vital signs and the nursing notes.  Pertinent labs & imaging results that were available during my care of the patient were reviewed by me and considered in my medical decision making (see chart for details).    MDM Rules/Calculators/A&P                           75 yo M with a cc of Foley catheter placement without urine return.  They also were unable to deflate the balloon.  I am also unable to deflate the balloon.  I cut off the balloon port and had no release of fluid.  I discussed this with the urologist on-call who recommended attempting pressure to the perineum if I could palpate the balloon which I was able to and the balloon spontaneously deflated and I was able to remove the catheter.  Nursing was able to place a catheter at bedside.  Patient with vagal event.  Apparently not uncommon for him.  Feels better, would like to go home.  PCP, urology follow up.  7:34 PM:  I have  discussed the diagnosis/risks/treatment options with the patient and believe the pt to be eligible for discharge home to follow-up with PCP, Urology. We also discussed returning to the ED immediately if new or worsening sx occur. We discussed the sx which are most concerning (e.g., sudden worsening pain, fever, inability to tolerate by mouth) that necessitate immediate return. Medications administered to the patient during their visit and any new prescriptions provided to the patient are listed below.  Medications given during this visit Medications - No data to display   The patient appears reasonably screen and/or stabilized for discharge and I doubt any other medical condition or other [facility] requiring further screening, evaluation, or treatment in the ED at this time prior to discharge.    Final Clinical Impression(s) / ED Diagnoses Final diagnoses:  Problem with Foley catheter, initial encounter [facility])    Rx / [place] Orders ED Discharge Orders     None        Deno Etienne, DO 03/14/21 1934

## 2021-03-14 NOTE — ED Notes (Signed)
ED Provider at bedside. 

## 2021-03-14 NOTE — ED Notes (Signed)
Foley successfully removed by EDP. Penis bleeding at this time

## 2021-03-14 NOTE — Telephone Encounter (Signed)
Lenn Sink, nurse with [facility], called to say that she inserted a catheter in patient and got a small amount of return. She stated the patient feels like his bladder is full. She could not get the catheter out and thinks he may be having a spasm. She stated she was going to send him to the ER.

## 2021-03-14 NOTE — Discharge Instructions (Signed)
Follow up with your urologist.  Return for worsening bleeding, fever, no output.

## 2021-03-14 NOTE — ED Notes (Signed)
EDP at bedside  

## 2021-03-15 ENCOUNTER — Telehealth: Payer: Self-pay

## 2021-03-15 NOTE — Telephone Encounter (Signed)
Called patient to follow up on ER visit with complication of catheter

## 2021-03-15 NOTE — Telephone Encounter (Signed)
As we discussed, we just needed to make sure pt is OK- sounds like went to ER- thanks- ML

## 2021-03-15 NOTE — ED Notes (Addendum)
Patient discharged home via [facility] in ambulance.  Wife at home to [facility].  0 s/s acute distress.  Foley cath draining clear yellow urine.  VS WDL.  Respirations even and unlabored.  All discharge instructions reviewed.  Patient verbalized understanding via teachback method.

## 2021-03-18 ENCOUNTER — Encounter: Payer: Medicare Other | Admitting: Physical Medicine and Rehabilitation

## 2021-03-21 ENCOUNTER — Telehealth: Payer: Self-pay | Admitting: Physical Medicine and Rehabilitation

## 2021-03-21 NOTE — Telephone Encounter (Signed)
Marissa from [facility] services is calling to confirm Diagnosis of "stage 2" for the treatment of patient.  Please contact her at [phone]

## 2021-03-22 NOTE — Telephone Encounter (Signed)
Called and discussed with them- they just needed recert on Stage II on Coccyx- ML

## 2021-03-27 ENCOUNTER — Other Ambulatory Visit: Payer: Self-pay | Admitting: Neurology

## 2021-03-27 MED ORDER — BACLOFEN 5 MG PO TABS: 5.0000 mg | ORAL_TABLET | Freq: Three times a day (TID) | ORAL | 2 refills | Status: DC

## 2021-03-27 NOTE — Telephone Encounter (Signed)
Spoke with pt's wife to r/s patients f/u with Dr. Pearlean Brownie. She mentioned on the phone that patient has ran out of prescription that was started in hospital Baclofen 5mg  x3 a day. She would like to know how to go about getting a refill for this for her husband as he is still having spasms. Thank you

## 2021-03-27 NOTE — Telephone Encounter (Signed)
Patient was seen in hospital by Dr. Pearlean Brownie. Pending stroke follow up at our office on 06/10/2021. His wife reports continued muscle spasms and would like the rx for baclofen refilled. This will be sent to our work-in MD for approval since Dr. Pearlean Brownie is out of the office. Pharmacy confirmed and correct in chart.

## 2021-03-27 NOTE — Addendum Note (Signed)
Addended by: Lindell Spar C on: 03/27/2021 01:41 PM   Modules accepted: Orders

## 2021-03-30 LAB — CUP PACEART REMOTE DEVICE CHECK
Date Time Interrogation Session: 20221123162326
Implantable Pulse Generator Implant Date: 20220811

## 2021-04-01 ENCOUNTER — Ambulatory Visit (INDEPENDENT_AMBULATORY_CARE_PROVIDER_SITE_OTHER): Payer: Medicare Other

## 2021-04-01 DIAGNOSIS — I6340 Cerebral infarction due to embolism of unspecified cerebral artery: Secondary | ICD-10-CM | POA: Diagnosis not present

## 2021-04-03 ENCOUNTER — Inpatient Hospital Stay: Payer: Medicare Other | Admitting: Neurology

## 2021-04-08 NOTE — Progress Notes (Signed)
Carelink Summary Report / Loop Recorder 

## 2021-05-05 ENCOUNTER — Inpatient Hospital Stay (HOSPITAL_BASED_OUTPATIENT_CLINIC_OR_DEPARTMENT_OTHER): Payer: Medicare Other

## 2021-05-05 ENCOUNTER — Emergency Department (HOSPITAL_COMMUNITY): Payer: Medicare Other

## 2021-05-05 ENCOUNTER — Encounter (HOSPITAL_COMMUNITY): Payer: Self-pay | Admitting: Emergency Medicine

## 2021-05-05 ENCOUNTER — Other Ambulatory Visit: Payer: Self-pay

## 2021-05-05 ENCOUNTER — Observation Stay (HOSPITAL_COMMUNITY)
Admission: EM | Admit: 2021-05-05 | Discharge: 2021-05-06 | Disposition: A | Discharge status: Home / Self Care | Payer: Medicare Other | Attending: Internal Medicine | Admitting: Internal Medicine

## 2021-05-05 DIAGNOSIS — Z20822 Contact with and (suspected) exposure to covid-19: Secondary | ICD-10-CM | POA: Insufficient documentation

## 2021-05-05 DIAGNOSIS — I2694 Multiple subsegmental pulmonary emboli without acute cor pulmonale: Secondary | ICD-10-CM | POA: Insufficient documentation

## 2021-05-05 DIAGNOSIS — L8990 Pressure ulcer of unspecified site, unspecified stage: Secondary | ICD-10-CM | POA: Insufficient documentation

## 2021-05-05 DIAGNOSIS — I4891 Unspecified atrial fibrillation: Principal | ICD-10-CM | POA: Insufficient documentation

## 2021-05-05 DIAGNOSIS — J9621 Acute and chronic respiratory failure with hypoxia: Secondary | ICD-10-CM

## 2021-05-05 DIAGNOSIS — N400 Enlarged prostate without lower urinary tract symptoms: Secondary | ICD-10-CM | POA: Diagnosis not present

## 2021-05-05 DIAGNOSIS — D696 Thrombocytopenia, unspecified: Secondary | ICD-10-CM | POA: Insufficient documentation

## 2021-05-05 DIAGNOSIS — Z79899 Other long term (current) drug therapy: Secondary | ICD-10-CM | POA: Diagnosis not present

## 2021-05-05 DIAGNOSIS — Z7982 Long term (current) use of aspirin: Secondary | ICD-10-CM | POA: Insufficient documentation

## 2021-05-05 DIAGNOSIS — I2699 Other pulmonary embolism without acute cor pulmonale: Secondary | ICD-10-CM | POA: Diagnosis present

## 2021-05-05 LAB — CBC WITH DIFFERENTIAL/PLATELET
Abs Immature Granulocytes: 0.04 10*3/uL (ref 0.00–0.07)
Basophils Absolute: 0 10*3/uL (ref 0.0–0.1)
Basophils Relative: 1 %
Eosinophils Absolute: 0.1 10*3/uL (ref 0.0–0.5)
Eosinophils Relative: 1 %
HCT: 31.8 % — ABNORMAL LOW (ref 39.0–52.0)
Hemoglobin: 10.4 g/dL — ABNORMAL LOW (ref 13.0–17.0)
Immature Granulocytes: 1 %
Lymphocytes Relative: 8 %
Lymphs Abs: 0.5 10*3/uL — ABNORMAL LOW (ref 0.7–4.0)
MCH: 31.3 pg (ref 26.0–34.0)
MCHC: 32.7 g/dL (ref 30.0–36.0)
MCV: 95.8 fL (ref 80.0–100.0)
Monocytes Absolute: 0.7 10*3/uL (ref 0.1–1.0)
Monocytes Relative: 10 %
Neutro Abs: 5.5 10*3/uL (ref 1.7–7.7)
Neutrophils Relative %: 79 %
Platelets: 144 10*3/uL — ABNORMAL LOW (ref 150–400)
RBC: 3.32 MIL/uL — ABNORMAL LOW (ref 4.22–5.81)
RDW: 14.3 % (ref 11.5–15.5)
WBC: 6.8 10*3/uL (ref 4.0–10.5)
nRBC: 0 % (ref 0.0–0.2)

## 2021-05-05 LAB — URINALYSIS, ROUTINE W REFLEX MICROSCOPIC
Bilirubin Urine: NEGATIVE
Glucose, UA: NEGATIVE mg/dL
Ketones, ur: 5 mg/dL — AB
Nitrite: NEGATIVE
Protein, ur: 30 mg/dL — AB
Specific Gravity, Urine: 1.023 (ref 1.005–1.030)
WBC, UA: >50 WBC/hpf — ABNORMAL HIGH (ref 0–5)
pH: 5 (ref 5.0–8.0)

## 2021-05-05 LAB — ECHOCARDIOGRAM COMPLETE
Area-P 1/2: 3.15 cm2
P 1/2 time: 394 msec
S' Lateral: 3.2 cm
Weight: 2560.86 oz

## 2021-05-05 LAB — RESP PANEL BY RT-PCR (FLU A&B, COVID) ARPGX2
Influenza A by PCR: NEGATIVE
Influenza B by PCR: NEGATIVE
SARS Coronavirus 2 by RT PCR: NEGATIVE

## 2021-05-05 LAB — COMPREHENSIVE METABOLIC PANEL
ALT: 8 U/L (ref 0–44)
AST: 14 U/L — ABNORMAL LOW (ref 15–41)
Albumin: 2.6 g/dL — ABNORMAL LOW (ref 3.5–5.0)
Alkaline Phosphatase: 58 U/L (ref 38–126)
Anion gap: 6 (ref 5–15)
BUN: 13 mg/dL (ref 8–23)
CO2: 22 mmol/L (ref 22–32)
Calcium: 8.4 mg/dL — ABNORMAL LOW (ref 8.9–10.3)
Chloride: 111 mmol/L (ref 98–111)
Creatinine, Ser: 0.42 mg/dL — ABNORMAL LOW (ref 0.61–1.24)
GFR, Estimated: >60 mL/min (ref >60)
Glucose, Bld: 127 mg/dL — ABNORMAL HIGH (ref 70–99)
Potassium: 3.3 mmol/L — ABNORMAL LOW (ref 3.5–5.1)
Sodium: 139 mmol/L (ref 135–145)
Total Bilirubin: 1 mg/dL (ref 0.3–1.2)
Total Protein: 5 g/dL — ABNORMAL LOW (ref 6.5–8.1)

## 2021-05-05 LAB — TROPONIN I (HIGH SENSITIVITY)
Troponin I (High Sensitivity): 43 ng/L — ABNORMAL HIGH (ref <18)
Troponin I (High Sensitivity): 240 ng/L (ref <18)
Troponin I (High Sensitivity): 586 ng/L (ref <18)
Troponin I (High Sensitivity): 754 ng/L (ref <18)
Troponin I (High Sensitivity): 684 ng/L (ref <18)
Troponin I (High Sensitivity): 609 ng/L (ref <18)

## 2021-05-05 LAB — PROTIME-INR
INR: 1 (ref 0.8–1.2)
Prothrombin Time: 13.6 seconds (ref 11.4–15.2)

## 2021-05-05 LAB — LACTIC ACID, PLASMA: Lactic Acid, Venous: 1.4 mmol/L (ref 0.5–1.9)

## 2021-05-05 LAB — APTT: aPTT: 27 seconds (ref 24–36)

## 2021-05-05 IMAGING — CT CT ABD-PELV W/ CM
2 of 5 series · 15 of 46 positions shown, 17 images · IV contrast (omnipaque)
Comparison: [DATE]

CLINICAL DATA: Acute nonlocalized abdominal pain. Pulmonary
embolism suspected

EXAM:
CT ANGIOGRAPHY CHEST
CT ABDOMEN AND PELVIS WITH CONTRAST
TECHNIQUE: Multidetector CT imaging of the chest was performed using the
standard protocol during bolus administration of intravenous
contrast. Multiplanar CT image reconstructions and MIPs were
obtained to evaluate the vascular anatomy. Multidetector CT imaging
of the abdomen and pelvis was performed using the standard protocol
during bolus administration of intravenous contrast.
CONTRAST:  100mL OMNIPAQUE IOHEXOL 350 MG/ML SOLN

[Series 3: a/p w/ 5mm · axial · 0.98mm/px · z∈[-1163,-663]mm · 12 of 112 slices shown, 14 images]
[im 6/112  soft-tissue]
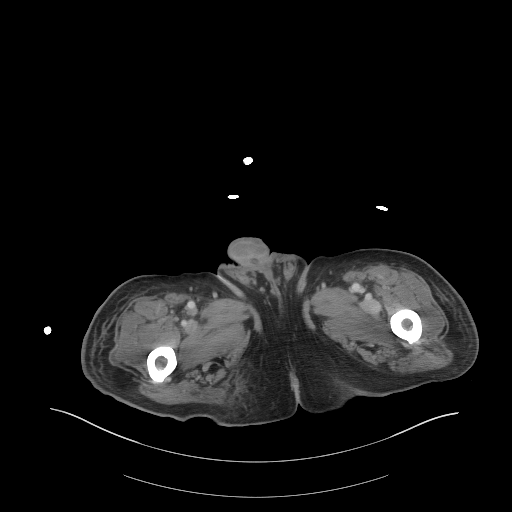
[im 6/112  bone]
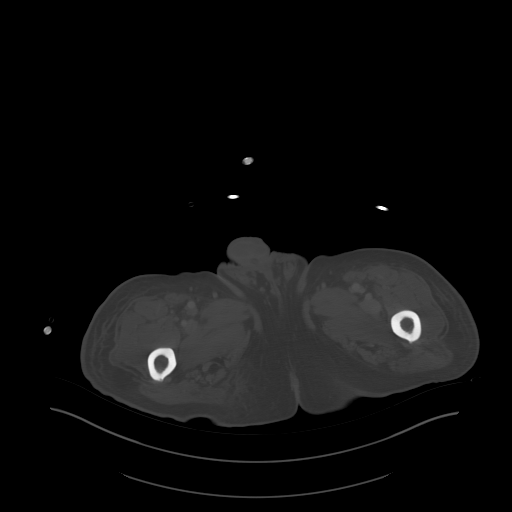
[im 18/112  soft-tissue]
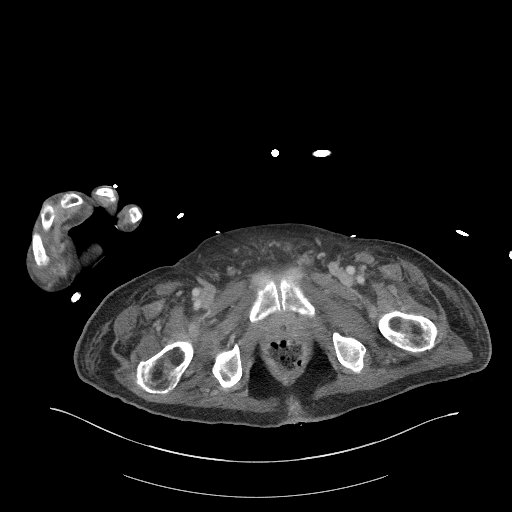
[im 24/112  soft-tissue]
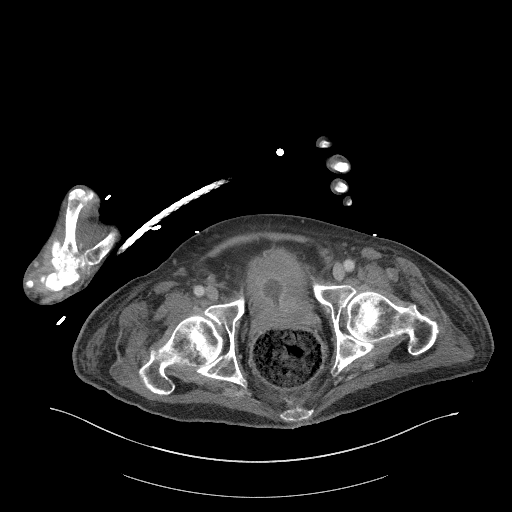
[im 36/112  soft-tissue]
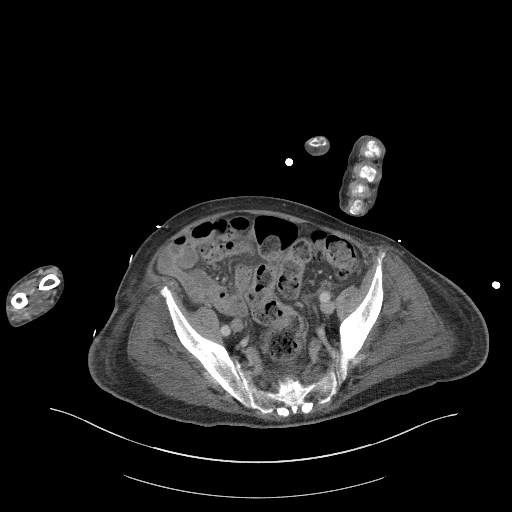
[im 41/112  soft-tissue]
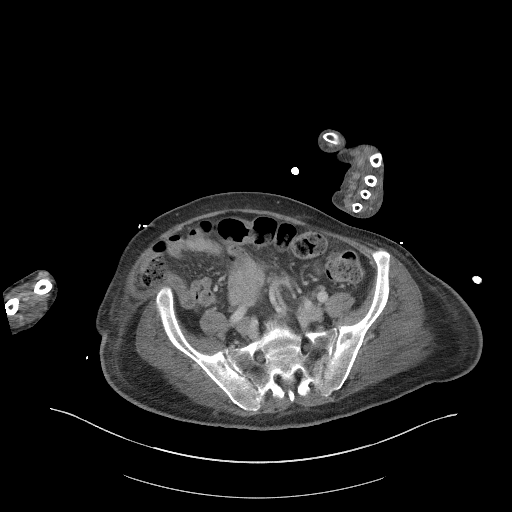
[im 53/112  soft-tissue]
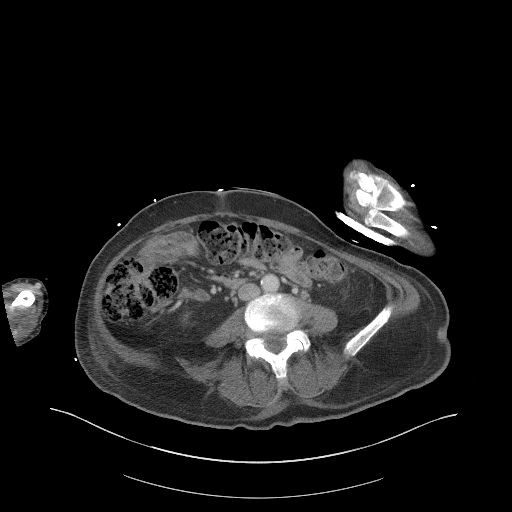
[im 59/112  soft-tissue]
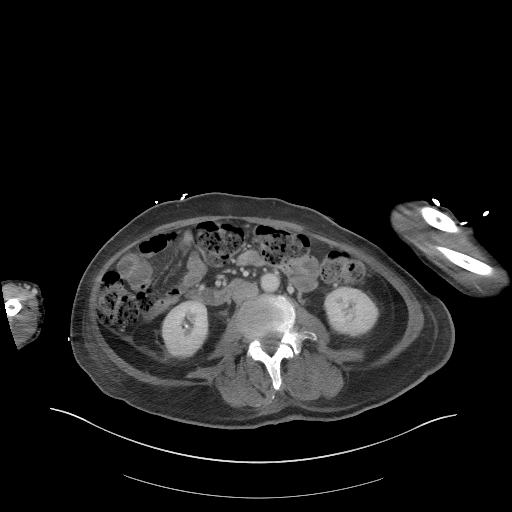
[im 71/112  soft-tissue]
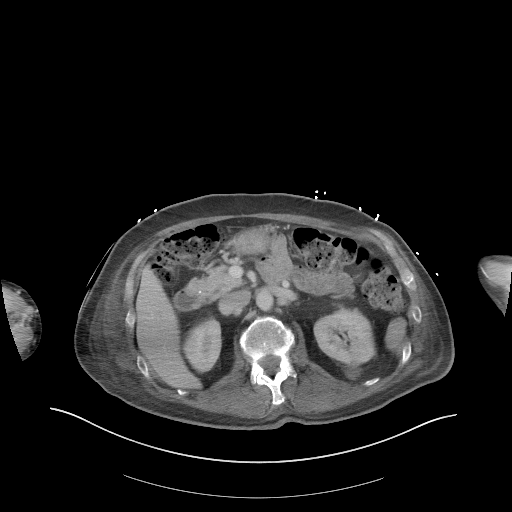
[im 76/112  soft-tissue]
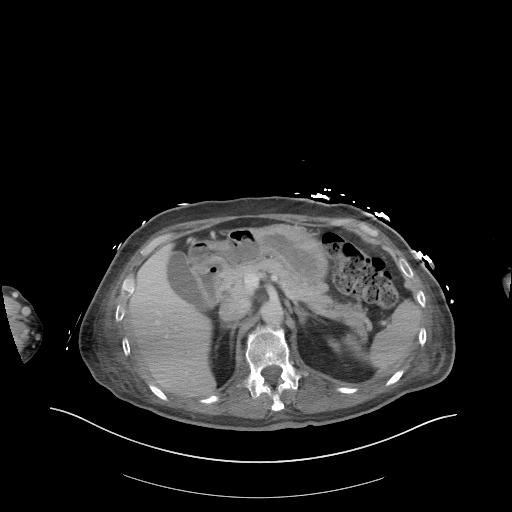
[im 76/112  bone]
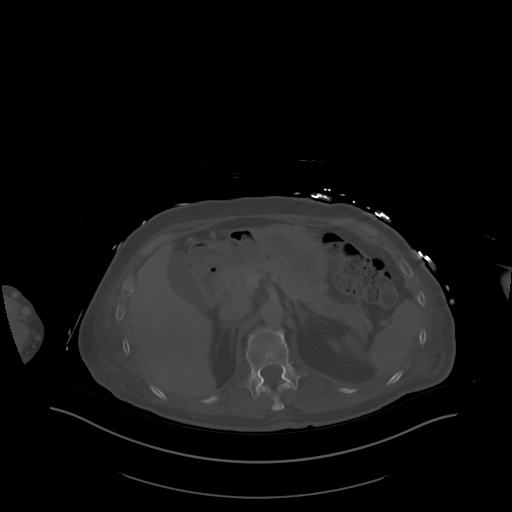
[im 88/112  soft-tissue]
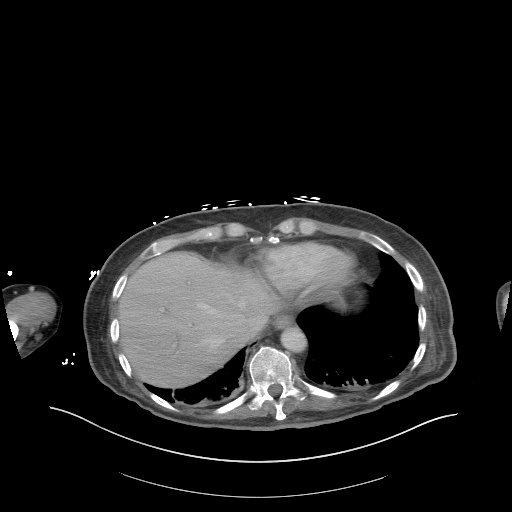
[im 94/112  soft-tissue]
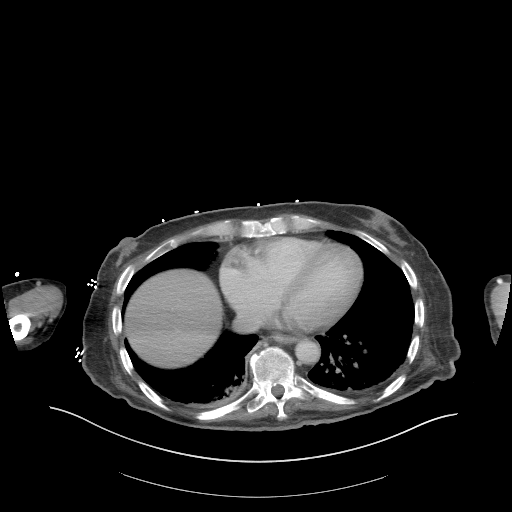
[im 106/112  soft-tissue]
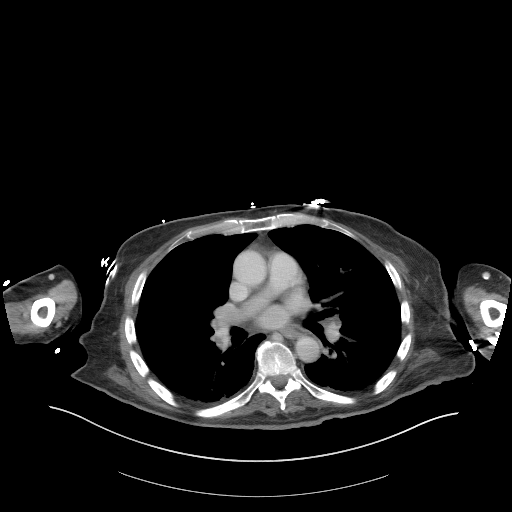

[Series 7: a/p w/ cor · coronal · 0.87mm/px · 3 of 116 slices shown]
[im 39/116  soft-tissue]
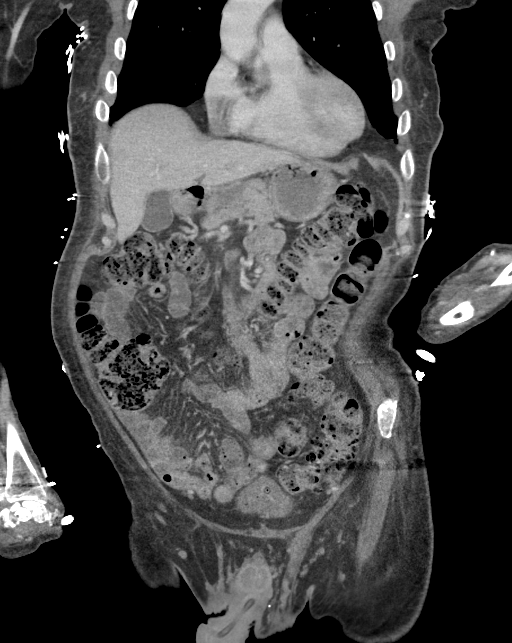
[im 52/116  soft-tissue]
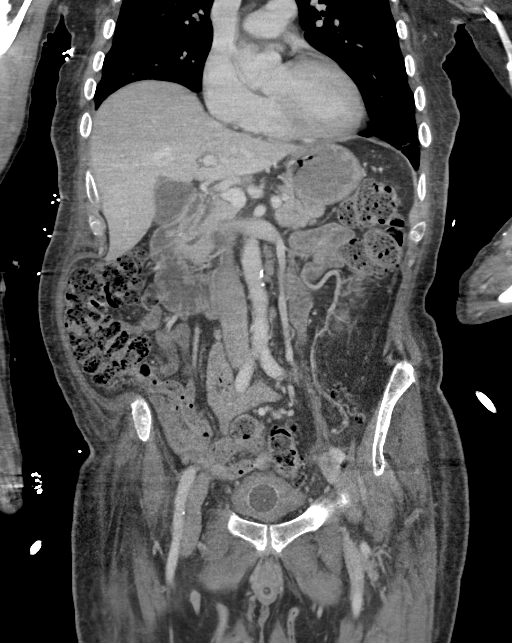
[im 64/116  soft-tissue]
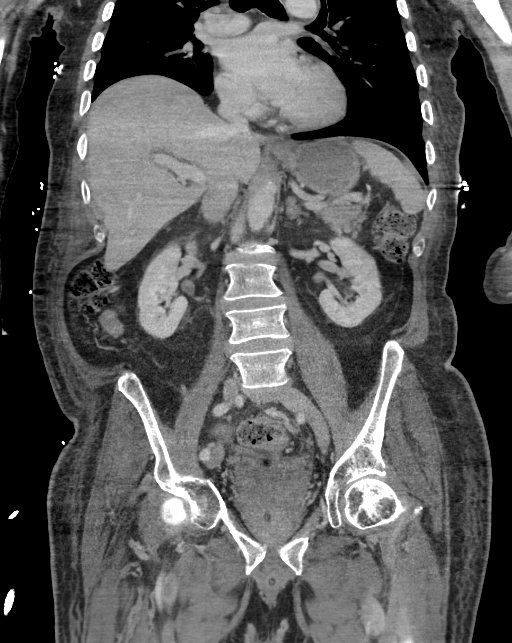

[15 of 46 positions shown; findings below may reference images not displayed]

FINDINGS: CTA CHEST FINDINGS

Cardiovascular: Acute branching pulmonary emboli in the bilateral
lungs with lobar and segmental clot seen multifocal. RV to LV ratio
is 0.8 when using oblique orthogonal views of the heart. Normal
heart size. No pericardial effusion. Atheromatous calcification of
the aorta that is mild

Mediastinum/Nodes: Negative for mass or adenopathy

Lungs/Pleura: Dependent atelectasis.

Musculoskeletal: Progressive lytic lesion with adjacent sclerosis at
T1, with loss of the posterior cortex. No acute finding or new
lesion.

Review of the MIP images confirms the above findings.

CT ABDOMEN and PELVIS FINDINGS

Lower chest:  No contributory findings.

Hepatobiliary: No focal liver abnormality.No evidence of biliary
obstruction or stone.

Pancreas: Unremarkable.

Spleen: Unremarkable.

Adrenals/Urinary Tract: Negative adrenals. No hydronephrosis or
stone. Limited assessment of the bladder which is collapsed around a
Foley catheter. Left renal cyst.

Stomach/Bowel: No obstruction. Rectal stool distention with
mesorectal fat congestion. The luminal diameter is 6.5 cm.

Vascular/Lymphatic: No acute vascular abnormality. Atheromatous
calcification. No mass or adenopathy.

Reproductive:No pathologic findings.

Other: No ascites or pneumoperitoneum.

Musculoskeletal: No acute abnormalities.

Review of the MIP images confirms the above findings.

Critical Value/emergent results were called by telephone at the time
of interpretation on [DATE] at [DATE] to provider ARSIH ,
who verbally acknowledged these results.
IMPRESSION: 1. Acute pulmonary emboli with bilateral lobar size clot. No
radiographic right heart strain or failure.
2. Known T1 body lesion with progressive lucency since [DATE].
No new osseous lesion.
3. Constipated appearance.  No acute intra-abdominal finding.

## 2021-05-05 IMAGING — DX DG CHEST 1V PORT
1 series · 1 of 1 positions shown · non-contrast
Comparison: [DATE] implanted loop

CLINICAL DATA: Sepsis

EXAM:
PORTABLE CHEST 1 VIEW

[chest ap]
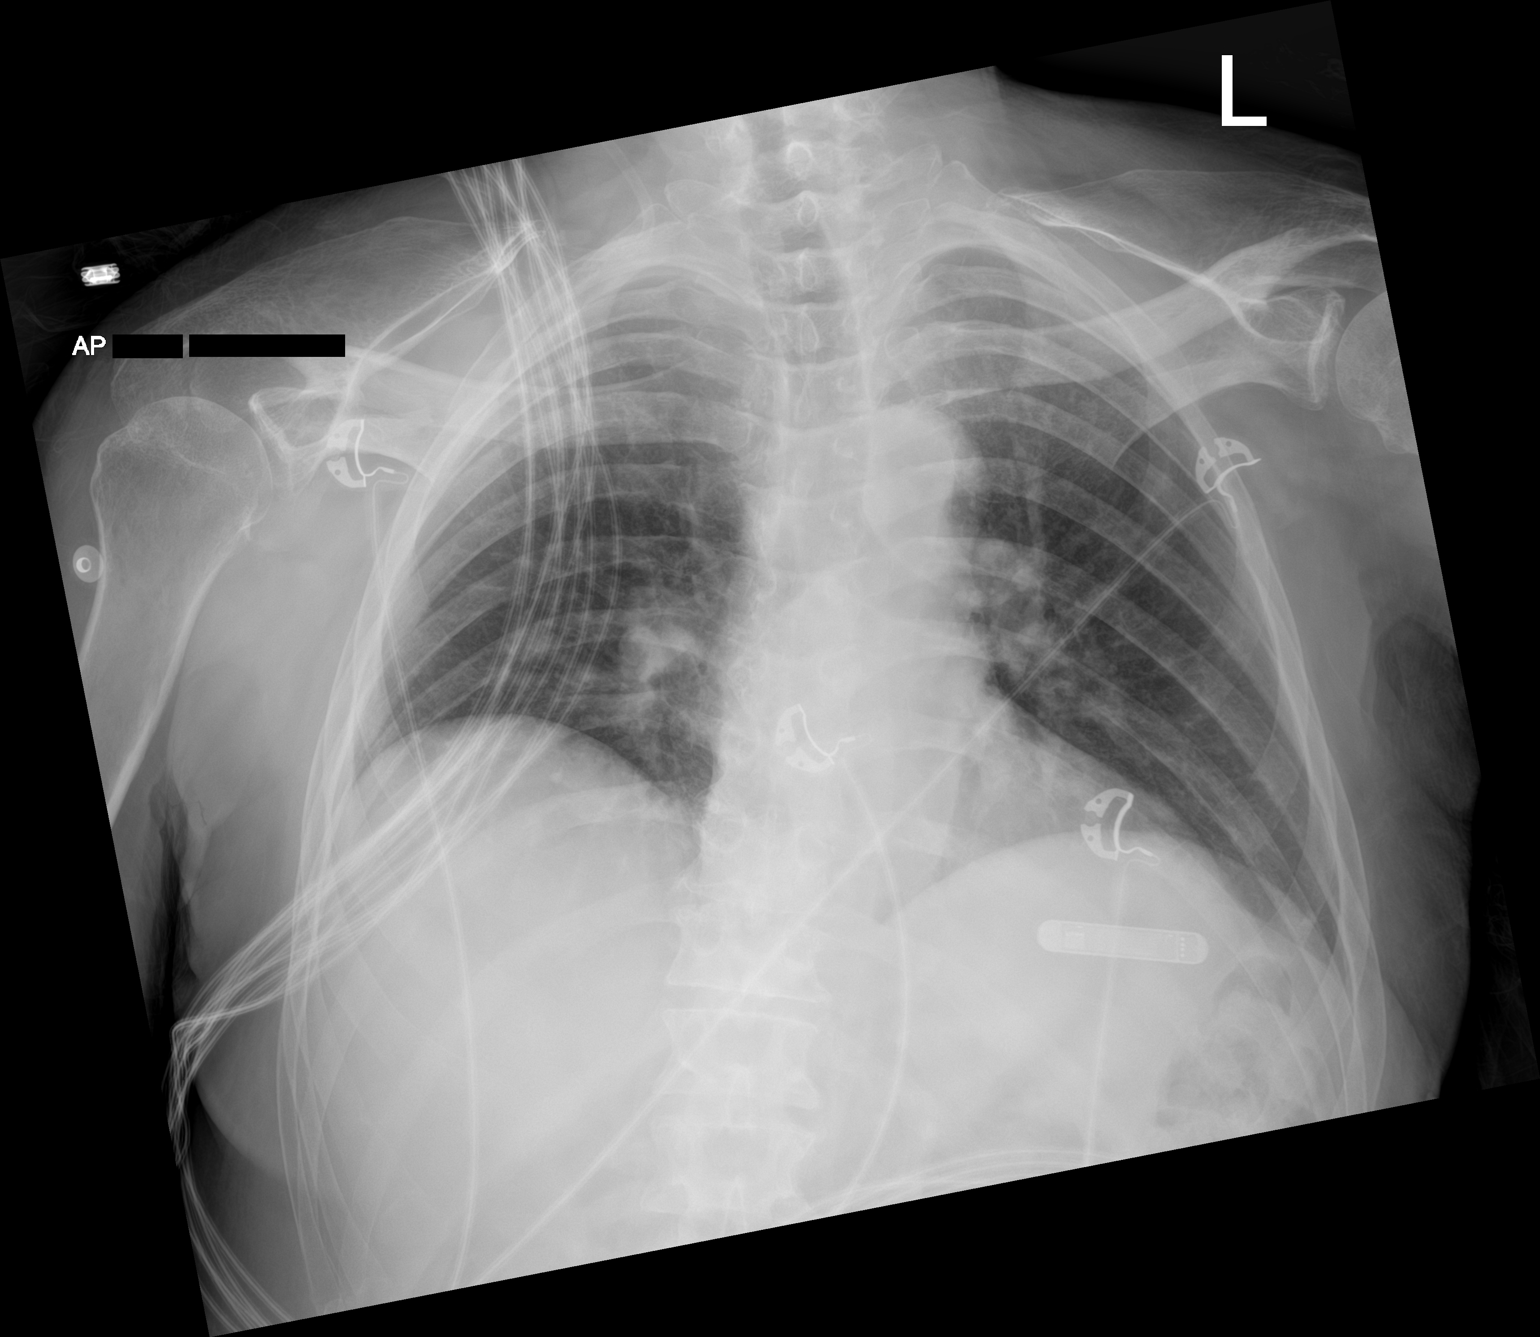

[1 of 1 positions shown; findings below may reference images not displayed]

FINDINGS: Lungs volumes are small, but are symmetric and are clear. No
pneumothorax or pleural effusion. Cardiac size within normal limits.
Implanted loop recorder again noted. Pulmonary vascularity is
normal. Osseous structures are age-appropriate. No acute bone
abnormality.
IMPRESSION: No active disease.

## 2021-05-05 IMAGING — CT CT ANGIO CHEST
2 of 6 series · 18 of 36 positions shown · IV contrast (omnipaque)
Comparison: [DATE]

CLINICAL DATA: Acute nonlocalized abdominal pain. Pulmonary
embolism suspected

EXAM:
CT ANGIOGRAPHY CHEST
CT ABDOMEN AND PELVIS WITH CONTRAST
TECHNIQUE: Multidetector CT imaging of the chest was performed using the
standard protocol during bolus administration of intravenous
contrast. Multiplanar CT image reconstructions and MIPs were
obtained to evaluate the vascular anatomy. Multidetector CT imaging
of the abdomen and pelvis was performed using the standard protocol
during bolus administration of intravenous contrast.
CONTRAST:  100mL OMNIPAQUE IOHEXOL 350 MG/ML SOLN

[Series 7: pe thins · axial · 0.89mm/px · z∈[-840,-524]mm · 17 of 356 slices shown]
[im 20/356  lung]
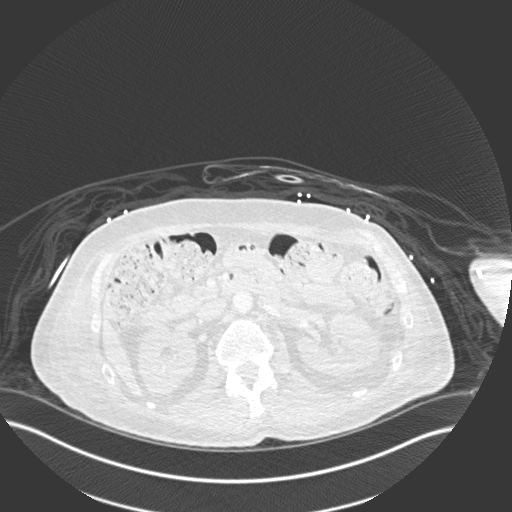
[im 40/356  mediastinal]
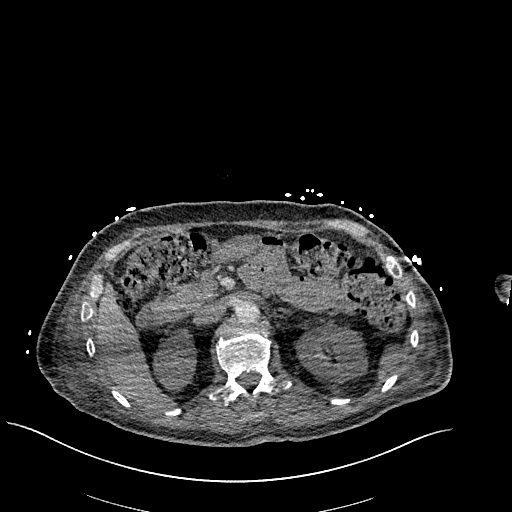
[im 60/356  lung]
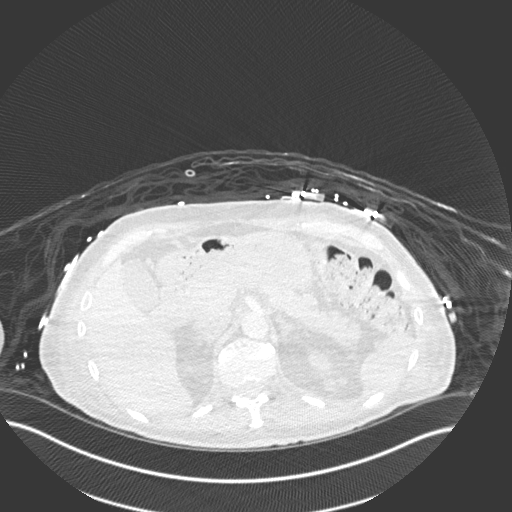
[im 79/356  mediastinal]
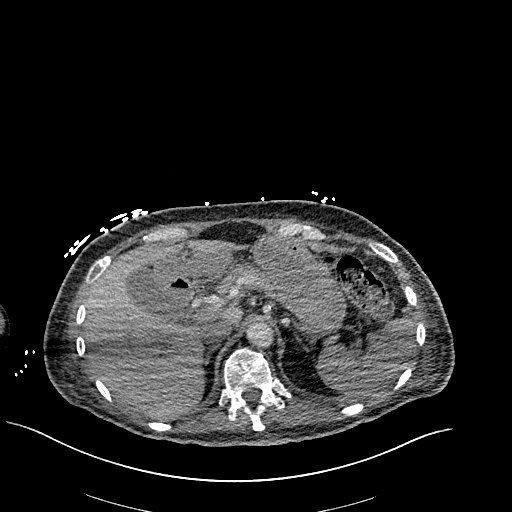
[im 99/356  lung]
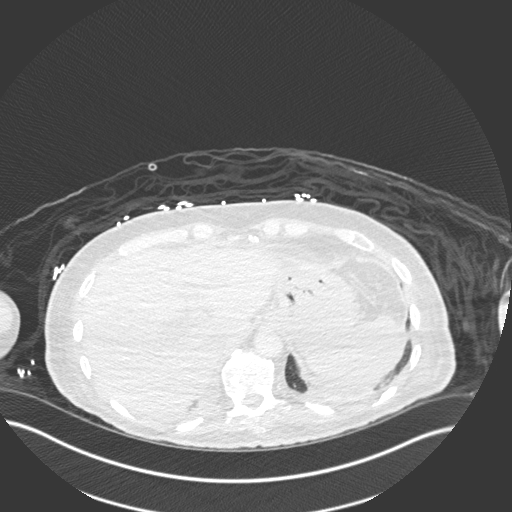
[im 119/356  mediastinal]
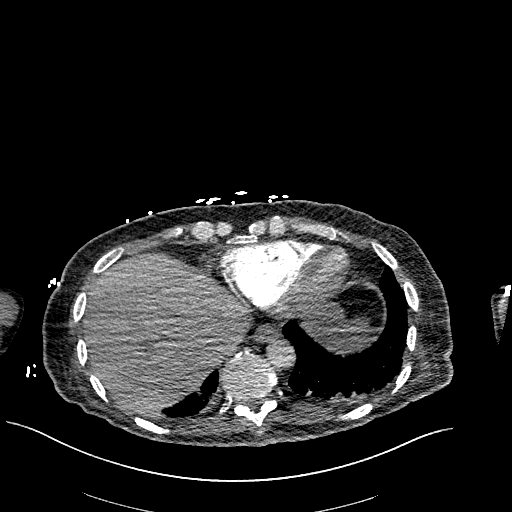
[im 139/356  lung]
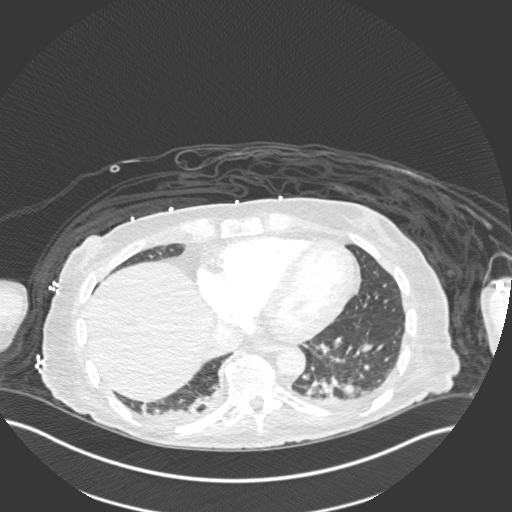
[im 158/356  mediastinal]
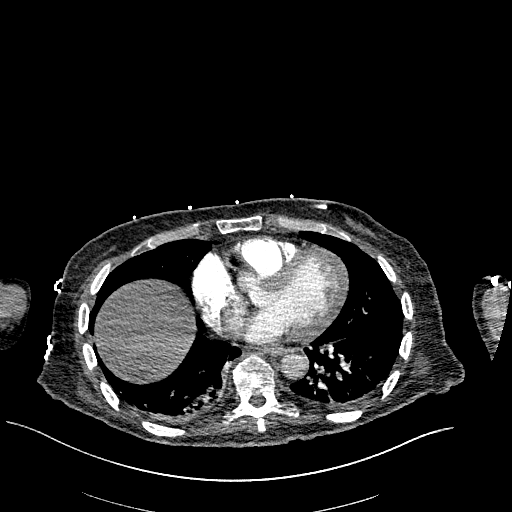
[im 178/356  lung]
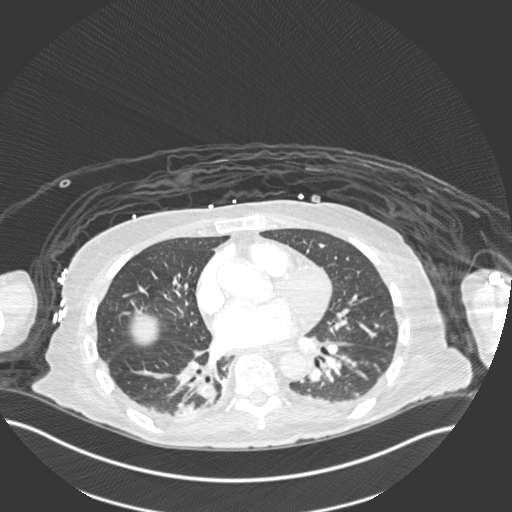
[im 198/356  mediastinal]
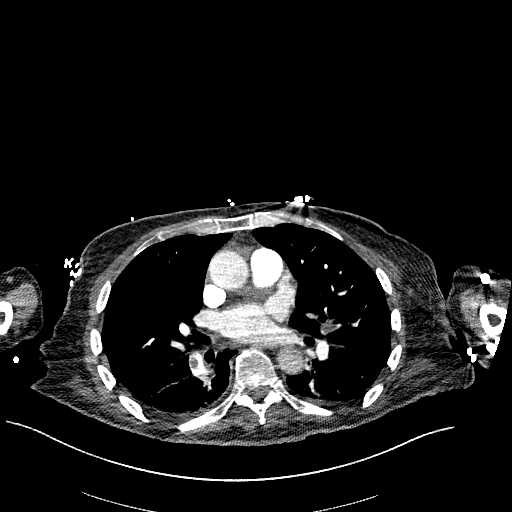
[im 217/356  lung]
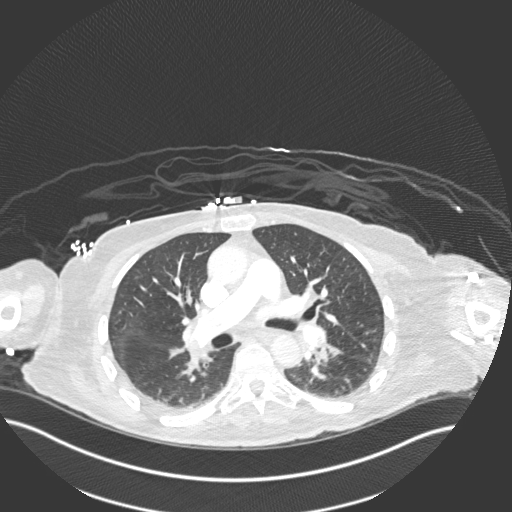
[im 237/356  mediastinal]
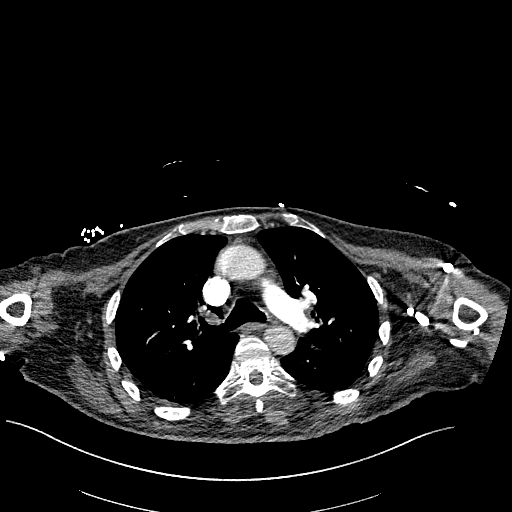
[im 257/356  lung]
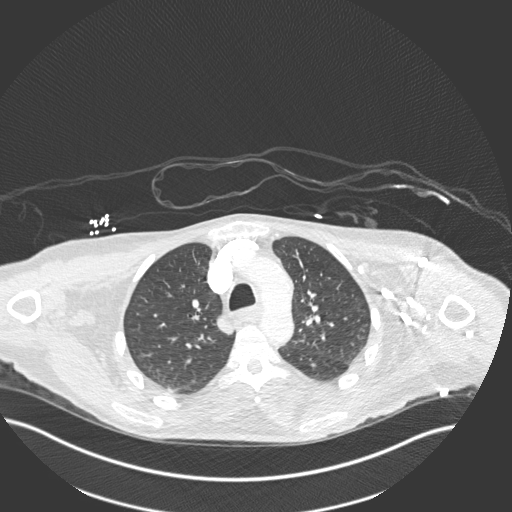
[im 277/356  mediastinal]
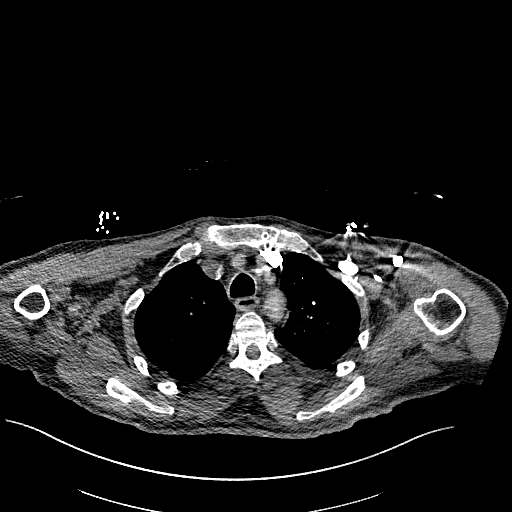
[im 296/356  lung]
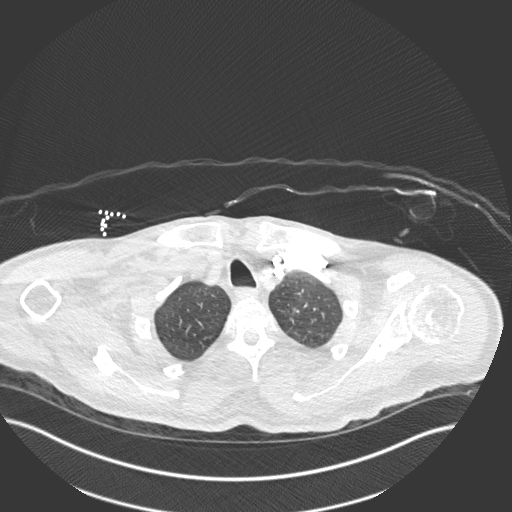
[im 316/356  mediastinal]
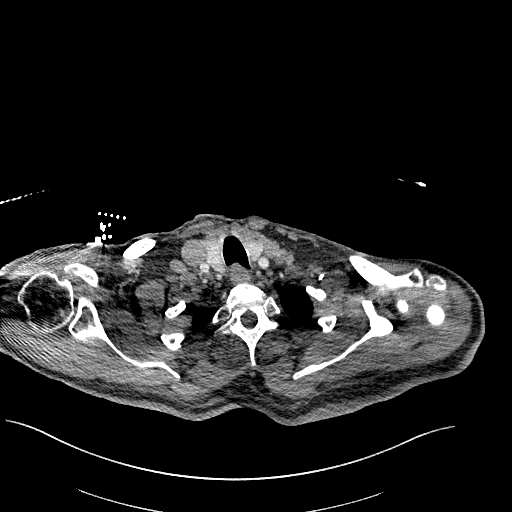
[im 336/356  lung]
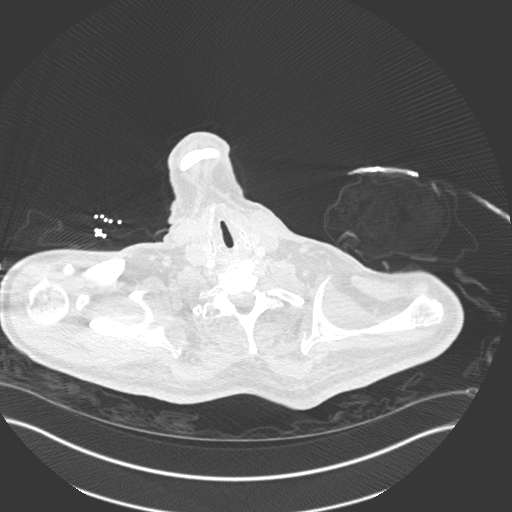

[Series 8: pe 2mm cor · coronal · 0.63mm/px · 1 of 103 slices shown]
[im 52/103  mediastinal]
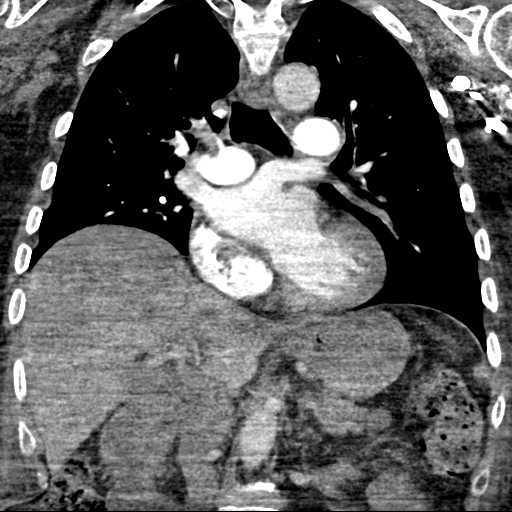

[18 of 36 positions shown; findings below may reference images not displayed]

FINDINGS: CTA CHEST FINDINGS

Cardiovascular: Acute branching pulmonary emboli in the bilateral
lungs with lobar and segmental clot seen multifocal. RV to LV ratio
is 0.8 when using oblique orthogonal views of the heart. Normal
heart size. No pericardial effusion. Atheromatous calcification of
the aorta that is mild

Mediastinum/Nodes: Negative for mass or adenopathy

Lungs/Pleura: Dependent atelectasis.

Musculoskeletal: Progressive lytic lesion with adjacent sclerosis at
T1, with loss of the posterior cortex. No acute finding or new
lesion.

Review of the MIP images confirms the above findings.

CT ABDOMEN and PELVIS FINDINGS

Lower chest:  No contributory findings.

Hepatobiliary: No focal liver abnormality.No evidence of biliary
obstruction or stone.

Pancreas: Unremarkable.

Spleen: Unremarkable.

Adrenals/Urinary Tract: Negative adrenals. No hydronephrosis or
stone. Limited assessment of the bladder which is collapsed around a
Foley catheter. Left renal cyst.

Stomach/Bowel: No obstruction. Rectal stool distention with
mesorectal fat congestion. The luminal diameter is 6.5 cm.

Vascular/Lymphatic: No acute vascular abnormality. Atheromatous
calcification. No mass or adenopathy.

Reproductive:No pathologic findings.

Other: No ascites or pneumoperitoneum.

Musculoskeletal: No acute abnormalities.

Review of the MIP images confirms the above findings.

Critical Value/emergent results were called by telephone at the time
of interpretation on [DATE] at [DATE] to provider ARSIH ,
who verbally acknowledged these results.
IMPRESSION: 1. Acute pulmonary emboli with bilateral lobar size clot. No
radiographic right heart strain or failure.
2. Known T1 body lesion with progressive lucency since [DATE].
No new osseous lesion.
3. Constipated appearance.  No acute intra-abdominal finding.

## 2021-05-05 MED ORDER — FENTANYL CITRATE PF 50 MCG/ML IJ SOSY
50.0000 ug | PREFILLED_SYRINGE | Freq: Once | INTRAMUSCULAR | Status: AC
  Administered 2021-05-05: 50 ug via INTRAVENOUS
  Filled 2021-05-05: qty 1

## 2021-05-05 MED ORDER — SODIUM CHLORIDE 0.9 % IV SOLN
2.0000 g | Freq: Once | INTRAVENOUS | Status: AC
  Administered 2021-05-05: 2 g via INTRAVENOUS
  Filled 2021-05-05: qty 20

## 2021-05-05 MED ORDER — SENNOSIDES-DOCUSATE SODIUM 8.6-50 MG PO TABS: 1.0000 | ORAL_TABLET | Freq: Every evening | ORAL | Status: DC | PRN

## 2021-05-05 MED ORDER — DILTIAZEM HCL 25 MG/5ML IV SOLN
20.0000 mg | Freq: Once | INTRAVENOUS | Status: AC
  Administered 2021-05-05: 20 mg via INTRAVENOUS
  Filled 2021-05-05: qty 5

## 2021-05-05 MED ORDER — ASPIRIN 81 MG PO CHEW
81.0000 mg | CHEWABLE_TABLET | Freq: Every day | ORAL | Status: DC
  Administered 2021-05-05 – 2021-05-06 (×2): 81 mg via ORAL
  Filled 2021-05-05 (×2): qty 1

## 2021-05-05 MED ORDER — PANTOPRAZOLE SODIUM 40 MG PO TBEC
40.0000 mg | DELAYED_RELEASE_TABLET | Freq: Every day | ORAL | Status: DC
  Administered 2021-05-05 – 2021-05-06 (×2): 40 mg via ORAL
  Filled 2021-05-05 (×2): qty 1

## 2021-05-05 MED ORDER — HEPARIN (PORCINE) 25000 UT/250ML-% IV SOLN
1200.0000 [IU]/h | INTRAVENOUS | Status: DC
  Administered 2021-05-05: 1200 [IU]/h via INTRAVENOUS
  Filled 2021-05-05: qty 250

## 2021-05-05 MED ORDER — NOREPINEPHRINE 4 MG/250ML-% IV SOLN
0.0000 ug/min | INTRAVENOUS | Status: DC
  Administered 2021-05-05: 2 ug/min via INTRAVENOUS
  Filled 2021-05-05: qty 250

## 2021-05-05 MED ORDER — LACTATED RINGERS IV BOLUS
1000.0000 mL | Freq: Once | INTRAVENOUS | Status: AC
  Administered 2021-05-05: 1000 mL via INTRAVENOUS

## 2021-05-05 MED ORDER — RIVAROXABAN 15 MG PO TABS
15.0000 mg | ORAL_TABLET | Freq: Two times a day (BID) | ORAL | Status: DC
  Administered 2021-05-05 – 2021-05-06 (×3): 15 mg via ORAL
  Filled 2021-05-05 (×3): qty 1

## 2021-05-05 MED ORDER — SALINE SPRAY 0.65 % NA SOLN
1.0000 | NASAL | Status: DC | PRN
  Filled 2021-05-05 (×2): qty 44

## 2021-05-05 MED ORDER — LACTATED RINGERS IV BOLUS
1000.0000 mL | Freq: Once | INTRAVENOUS | Status: AC
Start: 2021-05-05 — End: 2021-05-05
  Administered 2021-05-05: 1000 mL via INTRAVENOUS

## 2021-05-05 MED ORDER — BISACODYL 10 MG RE SUPP
10.0000 mg | Freq: Every day | RECTAL | Status: DC
  Filled 2021-05-05: qty 1

## 2021-05-05 MED ORDER — ACETAMINOPHEN 325 MG PO TABS
650.0000 mg | ORAL_TABLET | Freq: Four times a day (QID) | ORAL | Status: DC | PRN
  Administered 2021-05-05: 650 mg via ORAL
  Filled 2021-05-05: qty 2

## 2021-05-05 MED ORDER — IOHEXOL 350 MG/ML SOLN
100.0000 mL | Freq: Once | INTRAVENOUS | Status: AC | PRN
  Administered 2021-05-05: 100 mL via INTRAVENOUS

## 2021-05-05 MED ORDER — RIVAROXABAN 20 MG PO TABS: 20.0000 mg | ORAL_TABLET | Freq: Every day | ORAL | Status: DC

## 2021-05-05 MED ORDER — ACETAMINOPHEN 650 MG RE SUPP: 650.0000 mg | Freq: Four times a day (QID) | RECTAL | Status: DC | PRN

## 2021-05-05 MED ORDER — ATORVASTATIN CALCIUM 80 MG PO TABS
80.0000 mg | ORAL_TABLET | Freq: Every day | ORAL | Status: DC
  Administered 2021-05-05 – 2021-05-06 (×2): 80 mg via ORAL
  Filled 2021-05-05: qty 2
  Filled 2021-05-05: qty 1

## 2021-05-05 MED ORDER — HEPARIN BOLUS VIA INFUSION
4500.0000 [IU] | Freq: Once | INTRAVENOUS | Status: AC
  Administered 2021-05-05: 4500 [IU] via INTRAVENOUS
  Filled 2021-05-05: qty 4500

## 2021-05-05 MED ORDER — FLUDROCORTISONE ACETATE 0.1 MG PO TABS
0.1000 mg | ORAL_TABLET | Freq: Every day | ORAL | Status: DC
  Administered 2021-05-05 – 2021-05-06 (×2): 0.1 mg via ORAL
  Filled 2021-05-05 (×2): qty 1

## 2021-05-05 MED ORDER — CHLORHEXIDINE GLUCONATE CLOTH 2 % EX PADS
6.0000 | MEDICATED_PAD | Freq: Every day | CUTANEOUS | Status: DC
  Administered 2021-05-06: 6 via TOPICAL

## 2021-05-05 NOTE — ED Notes (Signed)
Dr. Pilar Plate made aware of BP 75/56. Plan to initiate vasopressor medication

## 2021-05-05 NOTE — ED Triage Notes (Addendum)
Pt bib EMS from home for heart palpitations and chest discomfort. No known cardiac hx. Pt quadriplegic from previous stroke. 6mg  of adenosine for a fib RVR, no change after medication administration. Pt denies pain aside from dull bilateral shoulder pain (non-radiating), noted to be diaphoretic. DNR paperwork at bedside  BP 86/54, 834mL fluid given  HR 76-170s

## 2021-05-05 NOTE — ED Provider Notes (Addendum)
[facility] Emergency Department Provider Note MRN:  [id number]  Arrival date & time: 05/05/21     Chief Complaint   A. fib History of Present Illness   [name] is a 76 y.o. year-old male with a history of stroke, paraplegia presenting to the ED with chief complaint of A. fib.  Sudden onset palpitations at 10:30 PM.  Associated with pain to bilateral shoulder blades, unchanged with deep breaths.  Patient generally feels unwell.  Denies any chest pain, no abdominal pain.  Paraplegic with minimal sensation.  Review of Systems  A thorough review of systems was obtained and all systems are negative except as noted in the HPI and PMH.   Patient's Health History    Past Medical History:  Diagnosis Date   CVA (cerebral vascular accident) [facility])     Past Surgical History:  Procedure Laterality Date   LOOP RECORDER INSERTION N/A 12/13/2020   Procedure: LOOP RECORDER INSERTION;  Surgeon: [name], MD;  Location: [place] [facility];  Service: Cardiovascular;  Laterality: N/A;   TONSILLECTOMY      Family History  Problem Relation Age of Onset   Cancer Mother    Dementia Mother    Heart attack Father    Clotting disorder Father     Social History   Socioeconomic History   Marital status: Married    Spouse name: Not on file   Number of children: 2   Years of education: Not on file   Highest education level: Not on file  Occupational History   Occupation: retired  Tobacco Use   Smoking status: Never   Smokeless tobacco: Never  Vaping Use   Vaping Use: Never used  Substance and Sexual Activity   Alcohol use: Not Currently   Drug use: Never   Sexual activity: Never  Other Topics Concern   Not on file  Social History Narrative   Not on file   Social Determinants of Health   Financial Resource Strain: Not on file  Food Insecurity: Not on file  Transportation Needs: Not on file  Physical Activity: Not on file  Stress: Not on file   Social Connections: Not on file  Intimate Partner Violence: Not on file     Physical Exam   Vitals:   05/05/21 0620 05/05/21 0637  BP:  119/64  Pulse:  82  Resp:  14  Temp: 98.7 F (37.1 C)   SpO2:  95%    CONSTITUTIONAL: Ill-appearing, NAD NEURO:  Alert and oriented x 3, paraplegic EYES:  eyes equal and reactive ENT/NECK:  no LAD, no JVD CARDIO: Tachycardic and irregular rate, well-perfused, normal S1 and S2 PULM:  CTAB no wheezing or rhonchi GI/GU:  non-distended, non-tender MSK/SPINE:  No gross deformities, no edema SKIN:  no rash, atraumatic   *Additional and/or pertinent findings included in MDM below  Diagnostic and Interventional Summary    EKG Interpretation  Date/Time:  Sunday May 05 2021 04:51:46 EST Ventricular Rate:  60 PR Interval:  183 QRS Duration: 92 QT Interval:  386 QTC Calculation: 386 R Axis:   -57 Text Interpretation: Sinus rhythm Left anterior fascicular block Minimal ST depression, anterolateral leads Confirmed by Gerlene Fee [phone]) on 05/05/2021 4:59:24 AM       Labs Reviewed  COMPREHENSIVE METABOLIC PANEL - Abnormal; Notable for the following components:      Result Value   Potassium 3.3 (*)    Glucose, Bld 127 (*)    Creatinine, Ser 0.42 (*)  Calcium 8.4 (*)    Total Protein 5.0 (*)    Albumin 2.6 (*)    AST 14 (*)    All other components within normal limits  CBC WITH DIFFERENTIAL/PLATELET - Abnormal; Notable for the following components:   RBC 3.32 (*)    Hemoglobin 10.4 (*)    HCT 31.8 (*)    Platelets 144 (*)    Lymphs Abs 0.5 (*)    All other components within normal limits  URINALYSIS, ROUTINE W REFLEX MICROSCOPIC - Abnormal; Notable for the following components:   Color, Urine AMBER (*)    APPearance CLOUDY (*)    Hgb urine dipstick MODERATE (*)    Ketones, ur 5 (*)    Protein, ur 30 (*)    Leukocytes,Ua LARGE (*)    WBC, UA >50 (*)    Bacteria, UA MANY (*)    All other components within normal limits   TROPONIN I (HIGH SENSITIVITY) - Abnormal; Notable for the following components:   Troponin I (High Sensitivity) 43 (*)    All other components within normal limits  TROPONIN I (HIGH SENSITIVITY) - Abnormal; Notable for the following components:   Troponin I (High Sensitivity) 240 (*)    All other components within normal limits  RESP PANEL BY RT-PCR (FLU A&B, COVID) ARPGX2  CULTURE, BLOOD (ROUTINE X 2)  CULTURE, BLOOD (ROUTINE X 2)  URINE CULTURE  LACTIC ACID, PLASMA  PROTIME-INR  APTT    DG Chest Port 1 View  Final Result    CT ABDOMEN PELVIS W CONTRAST    (Results Pending)  CT Angio Chest Pulmonary Embolism (PE) W or WO Contrast    (Results Pending)    Medications  lactated ringers bolus 1,000 mL (0 mLs Intravenous Stopped 05/05/21 0409)  diltiazem (CARDIZEM) injection 20 mg (20 mg Intravenous Given 05/05/21 0115)  lactated ringers bolus 1,000 mL (0 mLs Intravenous Stopped 05/05/21 0409)  cefTRIAXone (ROCEPHIN) 2 g in sodium chloride 0.9 % 100 mL IVPB (0 g Intravenous Stopped 05/05/21 0155)  fentaNYL (SUBLIMAZE) injection 50 mcg (50 mcg Intravenous Given 05/05/21 0113)  lactated ringers bolus 1,000 mL (0 mLs Intravenous Stopped 05/05/21 0542)  fentaNYL (SUBLIMAZE) injection 50 mcg (50 mcg Intravenous Given 05/05/21 0539)  iohexol (OMNIPAQUE) 350 MG/ML injection 100 mL (100 mLs Intravenous Contrast Given 05/05/21 0645)     Procedures  /  Critical Care .Critical Care Performed by: Maudie Flakes, MD Authorized by: Maudie Flakes, MD   Critical care provider statement:    Critical care time (minutes):  80   Critical care was necessary to treat or prevent imminent or life-threatening deterioration of the following conditions: A. fib with RVR, multiple pulmonary emboli.   Critical care was time spent personally by me on the following activities:  Development of treatment plan with patient or surrogate, discussions with consultants, evaluation of patient's response to treatment, examination of  patient, ordering and review of laboratory studies, ordering and review of radiographic studies, ordering and performing treatments and interventions, pulse oximetry, re-evaluation of patient's condition and review of old charts  ED Course and Medical Decision Making  I have reviewed the triage vital signs, the nursing notes, and pertinent available records from the EMR.  Listed above are laboratory and imaging tests that I personally ordered, reviewed, and interpreted and then considered in my medical decision making (see below for details).  Patient presenting with A. fib with RVR, Foley catheter and collection bag exhibiting very cloudy urine suspicious for UTI.  Per EMS report, which I personally received, patient also was short of breath with O2 saturations of about 90%, placed on 4 L nasal cannula.  This would be a new oxygen requirement.  And so we have hypoxic respiratory failure, A. fib with RVR,'s concern for underlying sepsis, making this a very complex patient interaction.  Providing pain control, diltiazem, IV fluids, empiric antibiotics.  Code sepsis initiated.  If no obvious infection there would be a raise concern for pulmonary embolism, will consider advanced imaging.     Urinalysis with some evidence to suggest infection, troponin is elevated and rising upon repeat.  Despite fluid resuscitation patient exhibiting hypotension, continues to have elevated rates in the 150s.  Started on norepinephrine at a low dose.  Shortly after patient then converted to sinus rhythm with rates in the 60s to 70s.  Began exhibiting hypotension while on the norepinephrine, which was discontinued.  Clinically seems to be doing better.  Had briefly discussed admission with the intensivist service, however given this improvement and no longer having a pressor requirement, suspect will be appropriate for the stepdown unit instead.  Awaiting CT imaging to further evaluate for PE or intra-abdominal infection.     CT imaging does reveal multiple pulmonary emboli, no right heart strain.  Patient continues to have much improved hemodynamics, heart rate in the 80s, normotensive, resting fairly comfortably.  Will admit to stepdown.  Barth Kirks. Sedonia Small, MD [place] [email or web address]  Final Clinical Impressions(s) / ED Diagnoses     ICD-10-CM   1. Atrial fibrillation with RVR (HCC)  I48.91     2. Multiple subsegmental pulmonary emboli without acute cor pulmonale (HCC)  I26.94       ED Discharge Orders     None        Discharge Instructions Discussed with and Provided to Patient:   Discharge Instructions   None       Maudie Flakes, MD 05/05/21 0636    Maudie Flakes, MD 05/05/21 0700

## 2021-05-05 NOTE — ED Notes (Signed)
RN assisted pt w/ eating lunch

## 2021-05-05 NOTE — ED Notes (Signed)
Critical troponin result of 240 reported to Dr. Pilar Plate, EDP.

## 2021-05-05 NOTE — Progress Notes (Signed)
ANTICOAGULATION CONSULT NOTE - Initial Consult  Pharmacy Consult for IV heparin  Indication: pulmonary embolus  No Known Allergies  Patient Measurements:   Heparin Dosing Weight: 72 kg  Vital Signs: Temp: 98.7 F (37.1 C) (01/01 0620) Temp Source: Rectal (01/01 0620) BP: 119/64 (01/01 0637) Pulse Rate: 82 (01/01 0637)  Labs: Recent Labs    05/05/21 0045 05/05/21 0419  HGB 10.4*  --   HCT 31.8*  --   PLT 144*  --   APTT 27  --   LABPROT 13.6  --   INR 1.0  --   CREATININE 0.42*  --   TROPONINIHS 43* 240*    CrCl cannot be calculated (Unknown ideal weight.).   Medical History: Past Medical History:  Diagnosis Date   CVA (cerebral vascular accident) ([place])     Medications:  Infusions:   Assessment: 76 yo M with chest discomfort and heart palpitations found to have acute PE with bilateral clot but no evidence of R heart strain/failure. No PTA AC. Pharmacy consulted to start IV heparin.   Goal of Therapy:  Heparin level 0.3-0.7 units/ml Monitor platelets by anticoagulation protocol: Yes   Plan:  Heparin IV bolus 4500 units Heparin IV 1200 units/hr continuous  8 H heparin level Daily CBC and heparin level  Thank you for allowing pharmacy to participate in this patient's care.  Levonne Spiller, PharmD PGY1 Acute Care Resident  05/05/2021,7:23 AM

## 2021-05-05 NOTE — Progress Notes (Signed)
°  Echocardiogram 2D Echocardiogram has been performed.  Darlina Sicilian M 05/05/2021, 12:24 PM

## 2021-05-05 NOTE — Progress Notes (Signed)
Pt being followed by [facility] for Sepsis protocol. 

## 2021-05-05 NOTE — H&P (Addendum)
Date: 05/05/2021               Patient Name:  [name] MRN: [id number]  DOB: 01-28-1946 Age / Sex: 76 y.o., male   PCP: Pcp, No         Medical Service: Internal Medicine Teaching Service         Attending Physician: Dr. 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    Patient reports that his wife was helping change him last night when she rolled him onto his right side and he began to experience sudden and intense heart palpitations. He felt as though something was wrong and called EMS. He denies any chest pain. He has had some shortness of breath since his stroke this past year, but this is at baseline. He has not experienced any dizziness, no cough, no abd pain, N/V/D, and no urinary symptoms, although he reports chronic numbness below the level of the diaphragm 2/2 stroke. He denies fever or chills.  No bladder discomfort. He has not been on Lovenox since November 11th. No hx of blood clots prior to stroke and paraplegia.  While in the ED, he was noted to be in afib with RVR on arrival and  concerns for sepsis secondary to UTI as the cause of his afib. Blood and urine cultures were collected. Patient became more hemodynamically unstable with rates to 150-170s and was tried on diltiazem but became hypotensive and was started on norepi briefly and spontaneously concerned to NSR with rates in the 60-80s. Pressors were able to be discontinued. CTA was ordered to evaluate and showed  bilateral PE and he was started on heparin.   Patient states that his goals for this admission are the be treated for PE and return home to continue working on getting stronger at home. Does not wish to go to SNF even if he qualifies.  Confirmed DNR status with patient.    Review of Systems  Constitutional:  Negative for chills and fever.  HENT:  Positive for congestion.        Negative rhinorrhea   Respiratory:  Positive for sputum production and shortness of breath.   Gastrointestinal:  Negative for diarrhea, nausea and vomiting.  Genitourinary:  Negative for dysuria.       Negative for frequency, constant flow with catheter  Musculoskeletal:  Positive for myalgias (positive shoulder pain).  Neurological:  Positive for weakness.    ED Course:  Meds:  Current Meds  Medication Sig   acetaminophen (TYLENOL) 325 MG tablet Take 2 tablets (650 mg total) by mouth every 4 (four) hours as needed for mild pain (or temp > 37.5 C (99.5 F)).   Ascorbic Acid (VITAMIN C PO) Take 1 tablet by mouth daily.   aspirin EC 81 MG EC tablet Take 1 tablet (81 mg total) by mouth daily. Swallow whole.   bisacodyl (DULCOLAX) 10 MG suppository Place 1 suppository (10 mg total) rectally daily at 8 pm.   Cholecalciferol (VITAMIN D3 PO)  Take 1 tablet by mouth daily.   Cyanocobalamin (VITAMIN B-12 PO) Take 1 drop by mouth daily.   diphenhydrAMINE-APAP, sleep, (TYLENOL PM EXTRA STRENGTH PO) Take 1 tablet by mouth as needed (sleep).   fludrocortisone (FLORINEF) 0.1 MG tablet Take 1 tablet (0.1 mg total) by mouth daily. For orthostatic hypotension/low BP   Multiple Vitamin (MULTIVITAMIN WITH MINERALS) TABS tablet Take 1 tablet by mouth daily.    Past Medical History:  Diagnosis Date   CVA (cerebral vascular accident) [facility])     Past Surgical History:  Procedure Laterality Date   LOOP RECORDER INSERTION N/A 12/13/2020   Procedure: LOOP RECORDER INSERTION;  Surgeon: Lanier Prude, MD;  Location: [facility]  INVASIVE CV LAB;  Service: Cardiovascular;  Laterality: N/A;   TONSILLECTOMY      Social:  Lives With: Wife. Son checks in on him as well. Has another daughter in [place]. 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   OBJECTIVE:   Physical Exam: Blood pressure 119/64, pulse 82, temperature 98.7 F (37.1 C), temperature source Rectal, resp. rate 14, weight 72.6 kg, SpO2 95 %.  Constitutional: chronically ill appearing male laying in bed, in no acute distress HENT: normocephalic atraumatic, mucous membranes moist Eyes: conjunctiva non-erythematous Neck: supple Cardiovascular: regular rate and rhythm, no m/r/g Pulmonary/Chest: normal work of breathing on room air, lungs clear to auscultation bilaterally Abdominal: soft, non-tender, non-distended GU: chronic indwelling foley catheter in place with cloudy amber urine and bag MSK: normal bulk, contracted left upper extremity Neurological: alert & oriented x 3, paraplegic, 3/5 strength right lower leg and right arm, left arm 2-3/5, able to sense pressure from diaphragm down. CN grossly intact.  Skin: warm and dry, chronic wound RLE Psych: mood and affect appropriate.  Labs: CBC    Component Value Date/Time   WBC 6.8 05/05/2021 0045   RBC 3.32 (L) 05/05/2021 0045   HGB 10.4 (L) 05/05/2021 0045   HCT 31.8 (L) 05/05/2021 0045   PLT 144 (L) 05/05/2021 0045   MCV 95.8 05/05/2021 0045   MCH 31.3 05/05/2021 0045   MCHC 32.7 05/05/2021 0045   RDW 14.3 05/05/2021 0045   LYMPHSABS 0.5 (L) 05/05/2021 0045   MONOABS 0.7 05/05/2021 0045   EOSABS 0.1 05/05/2021 0045   BASOSABS 0.0 05/05/2021 0045     CMP     Component Value Date/Time   NA 139 05/05/2021 0045   K 3.3 (L)  05/05/2021 0045   CL 111 05/05/2021 0045   CO2 22 05/05/2021 0045   GLUCOSE 127 (H) 05/05/2021 0045   BUN 13 05/05/2021 0045   CREATININE 0.42 (L) 05/05/2021 0045   CALCIUM 8.4 (L) 05/05/2021 0045   PROT 5.0 (L) 05/05/2021 0045   ALBUMIN 2.6 (L) 05/05/2021 0045   AST 14 (L) 05/05/2021 0045   ALT 8 05/05/2021 0045   ALKPHOS 58 05/05/2021 0045   BILITOT 1.0 05/05/2021 0045   GFRNONAA >60 05/05/2021 0045    Imaging: CT Angio Chest Pulmonary Embolism (PE) W or WO Contrast  Result Date: 05/05/2021 CLINICAL DATA:  Acute nonlocalized abdominal pain. Pulmonary embolism suspected EXAM: CT ANGIOGRAPHY CHEST CT ABDOMEN AND PELVIS WITH CONTRAST TECHNIQUE: Multidetector CT imaging of the chest was performed using the standard protocol during bolus  administration of intravenous contrast. Multiplanar CT image reconstructions and MIPs were obtained to evaluate the vascular anatomy. Multidetector CT imaging of the abdomen and pelvis was performed using the standard protocol during bolus administration of intravenous contrast. CONTRAST:  100mL OMNIPAQUE IOHEXOL 350 MG/ML SOLN COMPARISON:  12/08/2020 FINDINGS: CTA CHEST FINDINGS Cardiovascular: Acute branching pulmonary emboli in the bilateral lungs with lobar and segmental clot seen multifocal. RV to LV ratio is 0.8 when using oblique orthogonal views of the heart. Normal heart size. No pericardial effusion. Atheromatous calcification of the aorta that is mild Mediastinum/Nodes: Negative for mass or adenopathy Lungs/Pleura: Dependent atelectasis. Musculoskeletal: Progressive lytic lesion with adjacent sclerosis at T1, with loss of the posterior cortex. No acute finding or new lesion. Review of the MIP images confirms the above findings. CT ABDOMEN and PELVIS FINDINGS Lower chest:  No contributory findings. Hepatobiliary: No focal liver abnormality.No evidence of biliary obstruction or stone. Pancreas: Unremarkable. Spleen: Unremarkable. Adrenals/Urinary Tract:  Negative adrenals. No hydronephrosis or stone. Limited assessment of the bladder which is collapsed around a Foley catheter. Left renal cyst. Stomach/Bowel: No obstruction. Rectal stool distention with mesorectal fat congestion. The luminal diameter is 6.5 cm. Vascular/Lymphatic: No acute vascular abnormality. Atheromatous calcification. No mass or adenopathy. Reproductive:No pathologic findings. Other: No ascites or pneumoperitoneum. Musculoskeletal: No acute abnormalities. Review of the MIP images confirms the above findings. Critical Value/emergent results were called by telephone at the time of interpretation on 05/05/2021 at 6:59 am to provider [facility] , who verbally acknowledged these results. IMPRESSION: 1. Acute pulmonary emboli with bilateral lobar size clot. No radiographic right heart strain or failure. 2. Known T1 body lesion with progressive lucency since August 2022. No new osseous lesion. 3. Constipated appearance.  No acute intra-abdominal finding. Electronically Signed   By: Tiburcio PeaJonathan  Watts M.D.   On: 05/05/2021 06:59   CT ABDOMEN PELVIS W CONTRAST  Result Date: 05/05/2021 CLINICAL DATA:  Acute nonlocalized abdominal pain. Pulmonary embolism suspected EXAM: CT ANGIOGRAPHY CHEST CT ABDOMEN AND PELVIS WITH CONTRAST TECHNIQUE: Multidetector CT imaging of the chest was performed using the standard protocol during bolus administration of intravenous contrast. Multiplanar CT image reconstructions and MIPs were obtained to evaluate the vascular anatomy. Multidetector CT imaging of the abdomen and pelvis was performed using the standard protocol during bolus administration of intravenous contrast. CONTRAST:  100mL OMNIPAQUE IOHEXOL 350 MG/ML SOLN COMPARISON:  12/08/2020 FINDINGS: CTA CHEST FINDINGS Cardiovascular: Acute branching pulmonary emboli in the bilateral lungs with lobar and segmental clot seen multifocal. RV to LV ratio is 0.8 when using oblique orthogonal views of the heart. Normal heart  size. No pericardial effusion. Atheromatous calcification of the aorta that is mild Mediastinum/Nodes: Negative for mass or adenopathy Lungs/Pleura: Dependent atelectasis. Musculoskeletal: Progressive lytic lesion with adjacent sclerosis at T1, with loss of the posterior cortex. No acute finding or new lesion. Review of the MIP images confirms the above findings. CT ABDOMEN and PELVIS FINDINGS Lower chest:  No contributory findings. Hepatobiliary: No focal liver abnormality.No evidence of biliary obstruction or stone. Pancreas: Unremarkable. Spleen: Unremarkable. Adrenals/Urinary Tract: Negative adrenals. No hydronephrosis or stone. Limited assessment of the bladder which is collapsed around a Foley catheter. Left renal cyst. Stomach/Bowel: No obstruction. Rectal stool distention with mesorectal fat congestion. The luminal diameter is 6.5 cm. Vascular/Lymphatic: No acute vascular abnormality. Atheromatous calcification. No mass or adenopathy. Reproductive:No pathologic findings. Other: No ascites or pneumoperitoneum. Musculoskeletal: No acute abnormalities. Review of the MIP images confirms the above findings. Critical Value/emergent results were called by telephone  at the time of interpretation on 05/05/2021 at 6:59 am to provider [facility] , who verbally acknowledged these results. IMPRESSION: 1. Acute pulmonary emboli with bilateral lobar size clot. No radiographic right heart strain or failure. 2. Known T1 body lesion with progressive lucency since August 2022. No new osseous lesion. 3. Constipated appearance.  No acute intra-abdominal finding. Electronically Signed   By: Tiburcio Pea M.D.   On: 05/05/2021 06:59   DG Chest Port 1 View  Result Date: 05/05/2021 CLINICAL DATA:  Sepsis EXAM: PORTABLE CHEST 1 VIEW COMPARISON:  12/14/2020 implanted loop FINDINGS: Lungs volumes are small, but are symmetric and are clear. No pneumothorax or pleural effusion. Cardiac size within normal limits. Implanted loop  recorder again noted. Pulmonary vascularity is normal. Osseous structures are age-appropriate. No acute bone abnormality. IMPRESSION: No active disease. Electronically Signed   By: Helyn Numbers M.D.   On: 05/05/2021 01:37    EKG: personally reviewed my interpretation is sinus rhythm with left fascicular block and minimal ST depression in anterolateral leads.   ASSESSMENT & PLAN:    Assessment & Plan by Problem: Principal Problem:   Pulmonary embolism (HCC)   [name] is a 76 y.o. 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 No right heart strain seen on CT - ? Paraplegia vs malignancy as reason for hypercoagulable state. - he received heparin in the ED, but will be started on Xarelto - O2 as needed - will follow up stat ECHO to evaluate for possible right heart strain  # Afib with RVR # elevated troponin - initially rates 150-170s, required norepi briefly, spontaneously converted. Afib with RVR resolved, and currently hemodynamically stable - Troponin continues to rise 40 >240 >500's. Patient is not complaining of chest pain. Question whether this is demand ischemia in the setting of afib with RVR and hypotension vs heart strain from PE. - Continue with cardiac monitoring - continue to trend troponin - prior EKG showing left fascicular block, minimal ST depression in anterolateral leads. Will repeat EKG -if troponin continues to rise, progression of ST depression or new elevation on EKG, or concerning findings on ECHO, may need to consult cardiology  #Neurogenic bladder 2/2 stroke with chronic in dwelling foley catheter. Concerns for possible UTI #BPH with elevated PSA - urine cloudy and amber in  appearance, with last foley replacement approximately 1 month ago. Patient is unable to report any urinary symptoms as he is insensate below the level of the diaphragm. Unsure as to whether he has any increase in urinary frequency either as he has chronic foley.  - will replace foley catheter - patient has chronic in-dwelling foley catheter, although he may benefit from suprapubic cath placement. - urine and blood cultures collected for completion of sepsis workup. - patient and wife do not wish to pursue extensive workup for possible prostate cancer at this time.   # paraplegia 2/2 recent spinal cord and cerebral strokes with residual neurogenic bladder ? Underlying malignancy as source of hypercoagulable state as he has a hx of BPH with elevated PSA. No hx of blood clots prior to strokes either. Neurology previously evaluated and did not feel as though possible CA would be advanced enough for thrombogenic state given absence of nightsweats/fevers/weightloss.  - per  wife, patient and wife did not wish to pursue cancer workup due to other comorbidities.  Addendum: Troponin 700's appears to be leveling out. Blood pressure stable. EKG showing no changes from prior. ECHO findings within normal limits.  Suspect this to be related to demand ischemia secondary to his previous afib with RVR and hypotension. - Will continue to trend troponin until flat.  Diet: Normal VTE:  Xarelto IVF: None,None Code: DNR  Prior to Admission Living Arrangement: Home, living with wife Anticipated Discharge Location: Home Barriers to Discharge: pending medical stability  Dispo: Admit patient to Inpatient with expected length of stay greater than 2 midnights.  Signed: Adron BeneGawaluck, Charles Niese, MD Internal Medicine Resident PGY-1 Pager: [phone]  05/05/2021, 11:47 AM

## 2021-05-05 NOTE — ED Notes (Signed)
This RN and Wynona Canes, RN cleaned pt from bowel incontinence & changed pt into clean linens/brief.

## 2021-05-05 NOTE — ED Notes (Signed)
Patient transported to CT 

## 2021-05-06 ENCOUNTER — Other Ambulatory Visit (HOSPITAL_COMMUNITY): Payer: Self-pay

## 2021-05-06 ENCOUNTER — Encounter (HOSPITAL_COMMUNITY): Payer: Self-pay | Admitting: Internal Medicine

## 2021-05-06 DIAGNOSIS — I4891 Unspecified atrial fibrillation: Principal | ICD-10-CM

## 2021-05-06 DIAGNOSIS — I2694 Multiple subsegmental pulmonary emboli without acute cor pulmonale: Secondary | ICD-10-CM | POA: Diagnosis not present

## 2021-05-06 LAB — CBC
HCT: 30.6 % — ABNORMAL LOW (ref 39.0–52.0)
Hemoglobin: 10.2 g/dL — ABNORMAL LOW (ref 13.0–17.0)
MCH: 31.5 pg (ref 26.0–34.0)
MCHC: 33.3 g/dL (ref 30.0–36.0)
MCV: 94.4 fL (ref 80.0–100.0)
Platelets: 158 10*3/uL (ref 150–400)
RBC: 3.24 MIL/uL — ABNORMAL LOW (ref 4.22–5.81)
RDW: 14.4 % (ref 11.5–15.5)
WBC: 4.7 10*3/uL (ref 4.0–10.5)
nRBC: 0 % (ref 0.0–0.2)

## 2021-05-06 LAB — COMPREHENSIVE METABOLIC PANEL
ALT: 10 U/L (ref 0–44)
AST: 12 U/L — ABNORMAL LOW (ref 15–41)
Albumin: 2.5 g/dL — ABNORMAL LOW (ref 3.5–5.0)
Alkaline Phosphatase: 56 U/L (ref 38–126)
Anion gap: 4 — ABNORMAL LOW (ref 5–15)
BUN: 8 mg/dL (ref 8–23)
CO2: 25 mmol/L (ref 22–32)
Calcium: 8.7 mg/dL — ABNORMAL LOW (ref 8.9–10.3)
Chloride: 110 mmol/L (ref 98–111)
Creatinine, Ser: 0.42 mg/dL — ABNORMAL LOW (ref 0.61–1.24)
GFR, Estimated: >60 mL/min (ref >60)
Glucose, Bld: 91 mg/dL (ref 70–99)
Potassium: 3.3 mmol/L — ABNORMAL LOW (ref 3.5–5.1)
Sodium: 139 mmol/L (ref 135–145)
Total Bilirubin: 1.1 mg/dL (ref 0.3–1.2)
Total Protein: 4.9 g/dL — ABNORMAL LOW (ref 6.5–8.1)

## 2021-05-06 LAB — MRSA NEXT GEN BY PCR, NASAL: MRSA by PCR Next Gen: NOT DETECTED

## 2021-05-06 MED ORDER — LACTATED RINGERS IV SOLN: INTRAVENOUS | Status: DC

## 2021-05-06 MED ORDER — POTASSIUM CHLORIDE 20 MEQ PO PACK
40.0000 meq | PACK | Freq: Two times a day (BID) | ORAL | Status: DC
  Administered 2021-05-06: 40 meq via ORAL
  Filled 2021-05-06: qty 2

## 2021-05-06 MED ORDER — RIVAROXABAN (XARELTO) VTE STARTER PACK (15 & 20 MG)
ORAL_TABLET | ORAL | 0 refills | Status: DC
  Filled 2021-05-06: qty 51, 30d supply, fill #0

## 2021-05-06 NOTE — Care Management CC44 (Signed)
Condition Code 44 Documentation Completed  Patient Details  Name: [name] MRN: [id number] Date of Birth: 10/08/1945   Condition Code 44 given:  Yes Patient signature on Condition Code 44 notice:  Yes Documentation of 2 MD's agreement:  Yes Code 44 added to claim:  Yes    Darrold Span, RN 05/06/2021, 11:27 AM

## 2021-05-06 NOTE — Care Management Obs Status (Signed)
MEDICARE OBSERVATION STATUS NOTIFICATION   Patient Details  Name: [name] MRN: [id number] Date of Birth: 07/18/1945   Medicare Observation Status Notification Given:  Yes    Darrold Span, RN 05/06/2021, 11:27 AM

## 2021-05-06 NOTE — TOC Benefit Eligibility Note (Signed)
Patient Product/process development scientist completed.    The patient is currently admitted and upon discharge could be taking Xarelto 20 mg.  The current 30 day co-pay is, $61.85.   The patient is insured through [employer]     Roland Earl, CPhT Pharmacy Patient Advocate Specialist [facility] Patient Advocate Team Direct Number: [phone]  Fax: [phone]

## 2021-05-06 NOTE — Progress Notes (Signed)
OT Cancellation Note  Patient Details Name: [name] MRN: [id number] DOB: 1945-08-29   Cancelled Treatment:    Reason Eval/Treat Not Completed: OT screened, no needs identified, will sign off Initiated OT eval though noted EMS outside of pt room, ready to assist pt onto stretcher for DC home. Unable to complete OT eval at this time. Pt reports having all DME needs met at home with plans to resume HHOT.   Layla Maw 05/06/2021, 11:48 AM

## 2021-05-06 NOTE — Progress Notes (Deleted)
Note written in error.

## 2021-05-06 NOTE — Progress Notes (Signed)
PT Cancellation Note  Patient Details Name: [name] MRN: [id number] DOB: [birth year]   Cancelled Treatment:    Reason Eval/Treat Not Completed: PT screened, no needs identified, will sign off. Upon arrival of PT, the RN was in the process of preparing the pt for d/c with [facility] ready to transport the pt. The pt reports he is receiving HHPT/HHOT and will need these services to continue, but otherwise has all DME and support needed at home. No further acute PT needs at this time.  Vickki Muff, PT, DPT   Acute Rehabilitation Department Pager #: [phone]   Ronnie Derby 05/06/2021, 11:49 AM

## 2021-05-06 NOTE — TOC Transition Note (Signed)
Transition of Care (TOC) - CM/SW Discharge Note Donn Pierini RN, BSN Transitions of Care Unit 4E- RN Case Manager See Treatment Team for direct phone #    Patient Details  Name: [name] MRN: [id number] Date of Birth: Jul 02, 1945  Transition of Care [facility]) CM/SW Contact:  Darrold Span, RN Phone Number: 05/06/2021, 11:43 AM   Clinical Narrative:    Pt stable for transition home today, have spoken with pt and wife regarding transport, pt will transport via EMS- [facility]. Address has been verified and wife at home to receive pt. Wife does request call when transport arrives and pt is on his way. Bedside RN aware. Paperwork completed and GOLD DNR has been signed by MD.   [phone]- [facility] called for transport- per dispatch they should be here within 30 min to pick patient up to transport home. Bedside RN updated.  Code 44 completed at bedside with patient. TOC has delivered Xarelto to bedside- no copay as 30 day coupon used by [facility] pharmacy, Per benefits check copay will be $61.85 with pt's insurance.      Final next level of care: Home/Self Care Barriers to Discharge: No Barriers Identified   Patient Goals and CMS Choice Patient states their goals for this hospitalization and ongoing recovery are:: return home   Choice offered to / list presented to : NA  Discharge Placement               home        Discharge Plan and Services In-house Referral: NA Discharge Planning Services: CM Consult Post Acute Care Choice: NA          DME Arranged: N/A DME Agency: NA       HH Arranged: NA HH Agency: NA        Social Determinants of Health (SDOH) Interventions     Readmission Risk Interventions Readmission Risk Prevention Plan 05/06/2021  Transportation Screening Complete  PCP or Specialist Appt within 5-7 Days Complete  Home Care Screening Complete  Medication Review (RN CM) Complete

## 2021-05-06 NOTE — Progress Notes (Signed)
Order received to discharge patient.  Telemetry monitor removed and CCMD notified.  PIV access x2 removed.  Discharge instructions, follow up, medications and instructions for their use discussed with patient. Patient transported from hospital to home via [facility].

## 2021-05-06 NOTE — Progress Notes (Signed)
Date: 05/06/2021  Patient name: [name]  Medical record number: [id number]  Date of birth: [birth year]   I have seen and evaluated [name] and discussed their care with the Residency Team. 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  CT showed bilateral PE without right heart strain.  TTE showed grade 1 DD without right heart strain.  He was initiated on xarelto and is tolerating.  This morning, he has some brown sediment in his foley bag, but no other bleeding.  Hgb is stable from admission, but mildly lower than previous levels. TnI was initially elevated, peaked at 754 and then trended down.  EKG with tremulous baseline, no obvious ST changes.    Vitals:   05/06/21 0337 05/06/21 0900  BP: (!) 113/56 140/69  Pulse: 62 73  Resp: (!) 22 17  Temp: 97.6 F (36.4 C) 98.2 F (36.8 C)  SpO2: 97% 96%   Gen: Lying in bed, no distress, not requiring oxygen HENT: Neck is supple, MMM CV: RR, NR, no murmur, no pedal edema Pulm: Breathing comfortably on room air, no anterior wheezing Abd: Soft, +BS MSK: He has contractures of the hands/arms 2/2 previous injury and decreased bulk in the lower extremities Psych: Pleasant, requesting to go home, alert and oriented  Assessment and Plan: I have seen and evaluated the patient as outlined above. I agree with the formulated Assessment and Plan as detailed in the residents' note, with the following changes:   1. Provoked clot in the setting of possible malignancy (has opted to not have elevated PSA evaluated in the past) vs. Spinal cord injury and paraplegia - Xarelto started, cost is ~$60/month.  Will need to discuss with family if this is affordable for them - Would likely need lifelong anticoagulation - Hemodynamically stable, can likely discharge home today  2. Lucent bone mass, elevated  PSA - Reviewed previous notes and patient would need possible bone marrow biopsy, biopsy of prostate vs. Biopsy of thoracic spine mass.  Further discussion with family noted desire to focus on QOL.  Would recommend further discussion with PCP outpatient and possible Oncology vs. Urology follow up.   Other issues per daily note from Dr. Howie Ill.   Sid Falcon, MD 1/2/202310:06 AM

## 2021-05-06 NOTE — Discharge Summary (Addendum)
Name: [name] MRN: [id number] DOB: December 11, 1945 76 y.o. PCP: Pcp, No  Date of Admission: 05/05/2021 12:26 AM Date of Discharge: 05/06/2020 Attending Physician: Dr. Gilles Chiquito  Discharge Diagnosis: 1. Bilateral Pulmonary Embolism 2. Atrial Fibrillation with Rapid Ventricular Rate 3. Troponinemia 4. Neurogenic bladder secondary to stroke with chronic in dwelling foley catheter 5. Autonomic dysreflexia 6. 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 Thrombocytopenia   Discharge Medications: Allergies as of 05/06/2021   No Known Allergies      Medication List     STOP taking these medications    aspirin 81 MG EC tablet   citalopram 20 MG tablet Commonly known as: CELEXA       TAKE these medications    acetaminophen 325 MG tablet Commonly known as: TYLENOL Take 2 tablets (650 mg total) by mouth every 4 (four) hours as needed for mild pain (or temp > 37.5 C (99.5 F)).   atorvastatin 80 MG tablet Commonly known as: LIPITOR Take 1 tablet (80 mg total) by mouth daily.   Baclofen 5 MG Tabs Take 5 mg by mouth 3 (three) times daily.   bisacodyl 10 MG suppository Commonly known as: DULCOLAX Place 1 suppository (10 mg total) rectally daily at 8 pm.   fludrocortisone 0.1 MG tablet Commonly known as: FLORINEF Take 1 tablet (0.1 mg total) by mouth daily. For orthostatic hypotension/low BP   lidocaine 5 % Commonly known as: LIDODERM Place 3 patches onto the skin daily. Remove & Discard patch within 12 hours or as directed by MD   multivitamin with minerals Tabs tablet Take 1 tablet by mouth daily.   pantoprazole 40 MG tablet Commonly known as: PROTONIX Take 1 tablet (40 mg total) by mouth daily.   TYLENOL PM EXTRA STRENGTH PO Take 1 tablet by mouth as needed (sleep).   VITAMIN B-12 PO Take 1 drop by mouth daily.   VITAMIN C PO Take 1 tablet by mouth daily.    VITAMIN D3 PO Take 1 tablet by mouth daily.   Xarelto Starter Pack Generic drug: Rivaroxaban Stater Pack (15 mg and 20 mg) Follow package directions: Take one 88m tablet by mouth twice a day. On day 22, switch to one 259mtablet once a day. Take with food.               Discharge Care Instructions  (From admission, onward)           Start     Ordered   05/06/21 0000  Discharge wound care:       Comments: Clean right posterior calf with saline, pat dry. Cover with single layer of xeroform, top with foam. Change dressing every 3 days and PRN soilage.   05/06/21 1108            Disposition and follow-up:   [name]  At the hospital follow up visit please address:  1.  Bilateral Pulmonary embolism: patient presented with palpitations and was found to be in a fib with RVR 2/2 to acute pulmonary emboli with bilateral lobar size clots. TTE with no signs of right heart strain. Clots thought to be secondary to immobility vs possible underlying malignancy.  Patient and family are not interested in pursuing further work-up at this time. Started on Xarelto and will need to remain on this for life.  2. Lucent Bone mass,  elevated PSA: patient could need possible bone marrow biopsy, biopsy of prostate, biopsy of thoracic spine mass.  Patient and family expressed desire to focus on quality of life and not to pursue further work-up with patient's comorbities.  Recommended further discussion with PCP outpatient, then possible oncology vs urology follow-up.   3. Neurogenic bladder secondary to stroke with chronic in dwelling foley catheter: Presentation concerning for possible sepsis as cause for UTI before PE found.  UA with +leukocytes and -nitrites. Blood culture and urine culture done with NGTD.   4.  Labs / imaging needed at time of follow-up: CBC   5.  Pending labs/ test needing follow-up: Blood culture,  urine culture  6. Medication changes:   -Started Xarelto, 15 mg BID, then switch to 20 mg qd on day 22. Life-long anticoagulation  -Discontinue Aspirin, prior CVA thought to be thromboembolic, unlikely from atherosclerosis  Follow-up Appointments: [name] is currently trying to find a PCP that can come to the house to see him.  Hospital Course by problem list: #Bilateral Pulmonary Embolism #Atrial Fibrillation with Rapid Ventricular Rate Patient presented with palpitations and was found to be in afib with RVR with rates of 150-170s, required norepinephrine briefly, then spontaneously converted. TTE with no signs of right heart strain and patient remained hemodynamically stable.  He received heparin in the ED, but was started on Xarelto for PE. Thinking that clots are secondary to immobility vs underlying malignancy.  Patient and family have elected to not pursue further workup for .  He will need lifelong anticoagulation and family is ok with $60/ month cost for Xarelto.  #Troponinemia Troponin initially elevated with no changes from prior EKG and troponin leveled out.  Thinking that elevated troponin were secondary to demand ischemia secondary to previous Afib with RVR and hypotension.  # Neurogenic bladder secondary to stroke with chronic in dwelling foley catheter Initial concern in the ED for sepsis 2/2 to UTI.  Last foley replaced a month ago and was replaced on admission.  Patient did not report symptoms but is insensate below level of the diaphragm. Urinalysis with Leukocytes and no nitires. Urine culture and blood culture drawn, no growth to date.  Unlikely that patient has UTI so antibiotics no initiated, but will follow-up culture results.   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  Patient and family do not wish to pursue cancer workup due to other comorbidities.  # BPH with elevated PSA #Lucent Bone Mass # Thrombocytopenia Concern for possible cancer, however patient does not wish to under further work up at this time. Pathologic fracture seen on MRI cervical spine in 12/2020 at T1. Platelet count of 144 on admission.   Subjective: Patient evaluated at bedside this AM. He stated that he felt better and wanted to go home to see his wife.   Discharge Exam:   BP (!) 129/53 (BP Location: Left Arm)    Pulse 75    Temp 98.3 F (36.8 C) (Oral)    Resp 20    Ht _0  (1.727 m)    Wt 72.6 kg    SpO2 96%    BMI 24.34 kg/m  Discharge exam:  Gen: Chronically ill-appearing, in no acute distress, alert and oriented x3 HENT: NCAT, mucous membranes moist Eyes: no scleral icterus, conjunctiva  clear Heart: RRR, no murmurs Lungs: CTAB, normal pulmonary effort Abd: nondistended, soft, nontender MSK: contracture present in left upper extremity, paraplegic, decreased bulk Skin: warm and dry Psych: normal mood and affect  Pertinent Labs, Studies, and Procedures:  BMP Latest Ref Rng & Units 05/06/2021 05/05/2021 01/11/2021  Glucose 70 - 99 mg/dL 91 127(H) 96  BUN 8 - 23 mg/dL _0 Creatinine 0.61 - 1.24 mg/dL 0.42(L) 0.42(L) 0.64  Sodium 135 - 145 mmol/L 139 139 137  Potassium 3.5 - 5.1 mmol/L 3.3(L) 3.3(L) 3.9  Chloride 98 - 111 mmol/L 110 111 107  CO2 22 - 32 mmol/L _1 Calcium 8.9 - 10.3 mg/dL 8.7(L) 8.4(L) 9.0    CBC Latest Ref Rng & Units 05/06/2021 05/05/2021 01/11/2021  WBC 4.0 - 10.5 K/uL 4.7 6.8 6.5  Hemoglobin 13.0 - 17.0 g/dL 10.2(L) 10.4(L) 12.4(L)  Hematocrit 39.0 - 52.0 % 30.6(L) 31.8(L) 36.5(L)  Platelets 150 - 400 K/uL 158 144(L) 137(L)      CTA PE 05/05/21: IMPRESSION: 1. Acute pulmonary emboli with bilateral lobar size clot. No radiographic right heart strain or failure. 2. Known T1 body lesion with  progressive lucency since August 2022. No new osseous lesion. 3. Constipated appearance.  No acute intra-abdominal finding.   CT Abd/Pelvis 05/05/21: IMPRESSION: 1. Acute pulmonary emboli with bilateral lobar size clot. No radiographic right heart strain or failure. 2. Known T1 body lesion with progressive lucency since August 2022. No new osseous lesion. 3. Constipated appearance.  No acute intra-abdominal finding.      Discharge Instructions: Discharge Instructions     Discharge wound care:   Complete by: As directed    Clean right posterior calf with saline, pat dry. Cover with single layer of xeroform, top with foam. Change dressing every 3 days and PRN soilage.       Signed: Christiana Fuchs, DO 05/06/2021, 1:43 PM   Pager: [phone]

## 2021-05-06 NOTE — Consult Note (Signed)
WOC Nurse Consult Note: Reason for Consult:RLE and sacrum Reported sacral wound to be Stage 2, discussed with NT at bedside.  Foam is appropriate if needed for WOC to assess otherwise she will contact me once she sees patients sacrum  Wound type: Full thickness skin loss; right posterior calf; patient self reports wife has been caring for at home for about 2 weeks. Unclear etiology; patient wears bilateral PRAFO boots for prevention of foot drop, he does not recall injury from the boots. The skin ulcer is vertical in presentation which is not typical for an injury/pressure from the orthotic.  Would expect horizontal injury from straps, but it does have a support along the back of the boot which very well may be causing the issue. Discussed with patient, no family in the room Pressure Injury POA: Yes Measurement: right posterior calf; 15cm x 1.0cm x 0.2cm  Wound bed:100% clean and pink Drainage (amount, consistency, odor) scant, serosanguinous  Periwound: intact, no s/s of infection  Dressing procedure/placement/frequency: Right posterior calf; clean with saline, pat dry, cover with single layer of xeroform, top with foam. Change every 3 days  Continue silicone foam per the skin care order set to the sacrum, turn and reposition frequently.   Discussed POC with patient and bedside nurse.  Re consult if needed, will not follow at this time. Thanks  Kayren Holck [employer], RN,CWOCN, CNS, CWON-AP [phone])

## 2021-05-07 ENCOUNTER — Ambulatory Visit (INDEPENDENT_AMBULATORY_CARE_PROVIDER_SITE_OTHER): Payer: Medicare Other

## 2021-05-07 ENCOUNTER — Telehealth: Payer: Medicare Other | Admitting: Family Medicine

## 2021-05-07 DIAGNOSIS — I6340 Cerebral infarction due to embolism of unspecified cerebral artery: Secondary | ICD-10-CM | POA: Diagnosis not present

## 2021-05-07 LAB — URINE CULTURE: Culture: >=100000 — AB

## 2021-05-07 LAB — CUP PACEART REMOTE DEVICE CHECK
Date Time Interrogation Session: 20230102231427
Implantable Pulse Generator Implant Date: 20220811

## 2021-05-10 LAB — CULTURE, BLOOD (ROUTINE X 2)
Culture: NO GROWTH
Culture: NO GROWTH
Special Requests: ADEQUATE
Special Requests: ADEQUATE

## 2021-05-15 NOTE — Progress Notes (Signed)
Carelink Summary Report / Loop Recorder 

## 2021-05-16 ENCOUNTER — Other Ambulatory Visit (HOSPITAL_COMMUNITY): Payer: Self-pay

## 2021-05-16 ENCOUNTER — Telehealth (HOSPITAL_COMMUNITY): Payer: Self-pay | Admitting: Pharmacy Technician

## 2021-05-16 NOTE — Telephone Encounter (Signed)
Pharmacy Transitions of Care Follow-up Telephone Call  Date of discharge: 05/06/2021  Discharge Diagnosis: PE and Afib  How have you been since you were released from the hospital? Patient reports feeling much better. He has some SOB when sitting up too high and will tilt/recline and do breathing exercises to help. No CP or palpitations. No missed doses. He and his wife were grateful for the f/u call and expressed concerns regarding his transport to doctor's visits as well as support for his continued medical care at home. I advised them to also speak with the social worker regarding these concerns as they are interested in having a home visit from a doctor and education for the wife to be able facilitate his care needs at home.  Medication changes made at discharge: START taking: Xarelto Starter Pack (Rivaroxaban Stater Pack (15 mg and 20 mg))   STOP taking: aspirin 81 MG EC tablet  citalopram 20 MG tablet (CELEXA)   Medication changes verified by the patient?  Yes    Medication Accessibility:  Home Pharmacy: [facility] [place], [place]   Was the patient provided with refills on discharged medications? No    Have all prescriptions been transferred from [facility] to home pharmacy? N/A   Is the patient able to afford medications? Yes Notable copays: Xarelto $61.85/30 days   Medication Review:  RIVAROXABAN (XARELTO)  Rivaroxaban 15 mg BID initiated on 05/06/2021. Will switch to 20 mg daily after 21 days (05/28/2021).   - Discussed importance of taking medication with food and around the same time everyday  - Reviewed potential DDIs with patient  - Advised patient of medications to avoid (NSAIDs, ASA)  - Educated that Tylenol (acetaminophen) will be the preferred analgesic to prevent risk of bleeding  - Emphasized importance of monitoring for signs and symptoms of bleeding (abnormal bruising, prolonged bleeding, nose bleeds, bleeding from gums, discolored urine, black  tarry stools)  - Advised patient to alert all providers of anticoagulation therapy prior to starting a new medication or having a procedure    Follow-up Appointments:  Hospital f/u appt confirmed?  Scheduled to see Danella Sensing on 06/04/2021 @ 11:20 am.   If their condition worsens, is the pt aware to call PCP or go to the Emergency Dept.? Yes  Final Patient Assessment:  -Pt is doing better.  -Pt verbalized understanding of Xarelto.  -Pt has post discharge appointment.  -No refills to transfer.  Parthenia Ames, PharmD

## 2021-05-20 ENCOUNTER — Encounter: Payer: Medicare Other | Admitting: Physical Medicine and Rehabilitation

## 2021-05-31 ENCOUNTER — Telehealth: Payer: Self-pay | Admitting: Physical Medicine and Rehabilitation

## 2021-05-31 NOTE — Telephone Encounter (Signed)
Patients wife is calling about getting a refill on blood thinner for patient.  I went ahead and gave the patient the phone number to cardiologist on d/c paperwork, but not sure who would be prescribing this for the patient.  Please advise.

## 2021-05-31 NOTE — Telephone Encounter (Signed)
He needs to get from PCP- I don't prescribe long term- thank you ML

## 2021-06-03 ENCOUNTER — Telehealth: Payer: Self-pay | Admitting: Cardiology

## 2021-06-03 ENCOUNTER — Other Ambulatory Visit: Payer: Self-pay | Admitting: Internal Medicine

## 2021-06-03 ENCOUNTER — Other Ambulatory Visit (HOSPITAL_COMMUNITY): Payer: Self-pay

## 2021-06-03 MED ORDER — RIVAROXABAN 20 MG PO TABS: 20.0000 mg | ORAL_TABLET | Freq: Every day | ORAL | 0 refills | Status: DC

## 2021-06-03 NOTE — Telephone Encounter (Signed)
Prescription refill request for Xarelto received.  Indication: CVA Last office visit: Pt has appt on 06/27/21 with Keitha Butte, PA Weight: 72.6kg Age: 76 Scr: 0.42 (05/06/21) CrCl: 160.67ml/min  Appropriate dose and refill sent to requested pharmacy until follow-up appt on 06/27/21

## 2021-06-03 NOTE — Telephone Encounter (Signed)
°*  STAT* If patient is at the pharmacy, call can be transferred to refill team.   1. Which medications need to be refilled? (please list name of each medication and dose if known)  RIVAROXABAN (XARELTO) 20 MG  2. Which pharmacy/location (including street and city if local pharmacy) is medication to be sent to? [id number] - [place], [place] - [address]  3. Do they need a 30 day or 90 day supply? 30 with refills   Patient is scheduled to see [name] 06/27/21 but will not have enough medication to last until his appt

## 2021-06-04 ENCOUNTER — Encounter: Payer: Medicare Other | Admitting: Registered Nurse

## 2021-06-04 NOTE — Telephone Encounter (Signed)
Went straight to VM, left message

## 2021-06-05 NOTE — Telephone Encounter (Signed)
Looks like the cardiologist has addressed.

## 2021-06-06 ENCOUNTER — Telehealth: Payer: Self-pay | Admitting: *Deleted

## 2021-06-06 NOTE — Telephone Encounter (Signed)
Deanna Artis with [facility] called to request additional visits next week due to missed visits this week related to patient and his wife being exposed to flu.  Approval given

## 2021-06-10 ENCOUNTER — Inpatient Hospital Stay: Payer: Medicare Other | Admitting: Neurology

## 2021-06-12 ENCOUNTER — Telehealth: Payer: Self-pay

## 2021-06-12 NOTE — Telephone Encounter (Signed)
Marylene Land from [facility] called and said they received the order but patient hasn't responded or reorder since September. FYI

## 2021-06-12 NOTE — Telephone Encounter (Signed)
Can we check into why he hasn't gotten refilled- I'm concerned about him if not responding- thanks- ML

## 2021-06-12 NOTE — Telephone Encounter (Signed)
Pt wife states that [facility] is helping with the supplies.

## 2021-06-24 NOTE — Progress Notes (Unsigned)
Cardiology Office Note Date:  06/24/2021  Patient ID:  [name], [name] 25, 1947, MRN [id number] PCP:  Pcp, No  Electrophysiologist: Dr. Quentin Ore  ***refresh   Chief Complaint: *** post hospital  History of Present Illness: [name] is a 76 y.o. 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   A number of differentials though found to have acute stroke and felt quadriparesis 2/2 spinal coard infarct and suspect embolic event) > loop implant Subsequent neurogenic bladder w/foley, autonomic dysreflexia   He was admitted 05/05/21 with sudden on set of pounding palpitations found in rapid Afib though ultimately found with b/l PE and AFib perhaps 2/2 this this.  Discussion in looking into perhaps underlying malignancy (vs immobilized state), pt/family did not pant to pursue w/u into malignancy noting a finding of a abnormal PSA and bone lesion of the thoracic spine Discharged on Xarelto, life long a/c recommended.  *** bleeding, xarelto *** was it secondary?  Follow burden.  Device information MDT ILR implanted 12/13/2020   Past Medical History:  Diagnosis Date   CVA (cerebral vascular accident) [facility])     Past Surgical History:  Procedure Laterality Date   LOOP RECORDER INSERTION N/A 12/13/2020   Procedure: LOOP RECORDER INSERTION;  Surgeon: [name], MD;  Location: [facility];  Service: Cardiovascular;  Laterality: N/A;   TONSILLECTOMY      Current Outpatient Medications  Medication Sig Dispense Refill   acetaminophen (TYLENOL) 325 MG tablet Take 2 tablets (650 mg total) by mouth every 4 (four) hours as needed for mild pain (or temp > 37.5 C (99.5 F)).     Ascorbic Acid (VITAMIN C PO) Take 1 tablet by mouth daily.     atorvastatin (LIPITOR) 80 MG tablet Take 1 tablet (80 mg total) by mouth daily. 30 tablet 0   Baclofen 5 MG TABS Take 5 mg by mouth 3 (three) times daily.  (Patient not taking: Reported on 05/05/2021) 90 tablet 2   bisacodyl (DULCOLAX) 10 MG suppository Place 1 suppository (10 mg total) rectally daily at 8 pm. 12 suppository 0   Cholecalciferol (VITAMIN D3 PO) Take 1 tablet by mouth daily.     Cyanocobalamin (VITAMIN B-12 PO) Take 1 drop by mouth daily.     diphenhydrAMINE-APAP, sleep, (TYLENOL PM EXTRA STRENGTH PO) Take 1 tablet by mouth as needed (sleep).     fludrocortisone (FLORINEF) 0.1 MG tablet Take 1 tablet (0.1 mg total) by mouth daily. For orthostatic hypotension/low BP 30 tablet 5   lidocaine (LIDODERM) 5 % Place 3 patches onto the skin daily. Remove & Discard patch within 12 hours or as directed by MD (Patient not taking: Reported on 05/05/2021) 60 patch 0   Multiple Vitamin (MULTIVITAMIN WITH MINERALS) TABS tablet Take 1 tablet by mouth daily.     pantoprazole (PROTONIX) 40 MG tablet Take 1 tablet (40 mg total) by mouth daily. 30 tablet 0   rivaroxaban (XARELTO) 20 MG TABS tablet Take 1 tablet (20 mg total) by mouth daily with supper. 30 tablet 0   RIVAROXABAN (XARELTO) VTE STARTER PACK (15 & 20 MG) Follow package directions: Take one 15mg  tablet by mouth twice a day. On day 22, switch to one 20mg  tablet once a day. Take with food. 51 each 0   No current facility-administered medications for this visit.    Allergies:   Patient has no known allergies.   Social History:  The patient  reports that he has never smoked. He has never used smokeless tobacco. He reports that he does not currently use alcohol. He reports that he does not use drugs.   Family History:  The patient's family history includes Cancer in his mother; Clotting disorder in his father; Dementia in his mother; Heart attack in his father.  ROS:  Please see the history of present illness.    All other systems are reviewed and otherwise negative.   PHYSICAL EXAM:  VS:  There were no vitals taken for this visit. BMI: There is no height or weight on file to calculate BMI. Well  nourished, well developed, in no acute distress HEENT: normocephalic, atraumatic Neck: no JVD, carotid bruits or masses Cardiac:  *** RRR; no significant murmurs, no rubs, or gallops Lungs:  *** CTA b/l, no wheezing, rhonchi or rales Abd: soft, nontender MS: no deformity or *** atrophy Ext: *** no edema Skin: warm and dry, no rash Neuro:  No gross deficits appreciated Psych: euthymic mood, full affect  *** ILR site is stable, no tethering or discomfort   EKG:  not done today  Device interrogation done today and reviewed by myself:  ***  05/05/21: TTE IMPRESSIONS   1. Left ventricular ejection fraction, by estimation, is 60 to 65%. The  left ventricle has normal function. The left ventricle has no regional  wall motion abnormalities. There is mild concentric left ventricular  hypertrophy. Left ventricular diastolic  parameters are consistent with Grade I diastolic dysfunction (impaired  relaxation).   2. Right ventricular systolic function is normal. The right ventricular  size is normal. There is normal pulmonary artery systolic pressure.   3. The mitral valve is normal in structure. Mild mitral valve  regurgitation. No evidence of mitral stenosis.   4. The aortic valve is tricuspid. Aortic valve regurgitation is mild to  moderate. No aortic stenosis is present. Aortic regurgitation PHT measures  394 msec.   5. Aortic dilatation noted. There is mild dilatation of the aortic root,  measuring 38 mm.   6. The inferior vena cava is normal in size with greater than 50%  respiratory variability, suggesting right atrial pressure of 3 mmHg.   Recent Labs: 12/09/2020: TSH 1.128 12/13/2020: B Natriuretic Peptide 128.6 05/06/2021: ALT 10; BUN 8; Creatinine, Ser 0.42; Hemoglobin 10.2; Platelets 158; Potassium 3.3; Sodium 139  12/08/2020: Cholesterol 231; HDL 38; LDL Cholesterol 180; Total CHOL/HDL Ratio 6.1; Triglycerides 66; VLDL 13   CrCl cannot be calculated (Patient's most recent lab  result is older than the maximum 21 days allowed.).   Wt Readings from Last 3 Encounters:  05/05/21 160 lb 0.9 oz (72.6 kg)  01/11/21 160 lb (72.6 kg)  12/15/20 161 lb 6 oz (73.2 kg)     Other studies reviewed: Additional studies/records reviewed today include: summarized above  ASSESSMENT AND PLAN:  Paroxysmal Afib Suspect 2/2 acute b/l PE CHA2DS2Vasc is 6, on Xaretlt ***   Disposition: F/u with ***  Current medicines are reviewed at length with the patient today.  The patient did not have any concerns regarding medicines.  Venetia Night, PA-C 06/24/2021 8:10 PM     [address] [place] [address] [phone] (office)  [phone] (fax)

## 2021-06-27 ENCOUNTER — Encounter: Payer: Medicare Other | Admitting: Physician Assistant

## 2021-06-28 ENCOUNTER — Encounter: Payer: Medicare Other | Admitting: Registered Nurse

## 2021-07-15 ENCOUNTER — Ambulatory Visit (INDEPENDENT_AMBULATORY_CARE_PROVIDER_SITE_OTHER): Payer: Medicare Other

## 2021-07-15 DIAGNOSIS — I6340 Cerebral infarction due to embolism of unspecified cerebral artery: Secondary | ICD-10-CM

## 2021-07-15 LAB — CUP PACEART REMOTE DEVICE CHECK
Date Time Interrogation Session: 20230312231220
Implantable Pulse Generator Implant Date: 20220811

## 2021-07-25 NOTE — Progress Notes (Signed)
Carelink Summary Report / Loop Recorder 

## 2021-08-19 ENCOUNTER — Ambulatory Visit (INDEPENDENT_AMBULATORY_CARE_PROVIDER_SITE_OTHER): Payer: Medicare Other

## 2021-08-19 DIAGNOSIS — I6340 Cerebral infarction due to embolism of unspecified cerebral artery: Secondary | ICD-10-CM

## 2021-08-19 LAB — CUP PACEART REMOTE DEVICE CHECK
Date Time Interrogation Session: 20230414230930
Implantable Pulse Generator Implant Date: 20220811

## 2021-09-04 ENCOUNTER — Other Ambulatory Visit: Payer: Self-pay | Admitting: Physical Medicine and Rehabilitation

## 2021-09-04 NOTE — Progress Notes (Signed)
Carelink Summary Report / Loop Recorder 

## 2021-09-09 ENCOUNTER — Emergency Department (HOSPITAL_COMMUNITY): Payer: Medicare Other

## 2021-09-09 ENCOUNTER — Inpatient Hospital Stay (HOSPITAL_COMMUNITY): Payer: Medicare Other

## 2021-09-09 ENCOUNTER — Encounter (HOSPITAL_COMMUNITY): Payer: Self-pay | Admitting: Emergency Medicine

## 2021-09-09 ENCOUNTER — Inpatient Hospital Stay (HOSPITAL_COMMUNITY)
Admission: EM | Admit: 2021-09-09 | Discharge: 2021-09-12 | DRG: 698 | Disposition: A | Discharge status: Home Health | Payer: Medicare Other | Attending: Family Medicine | Admitting: Family Medicine

## 2021-09-09 ENCOUNTER — Other Ambulatory Visit: Payer: Self-pay

## 2021-09-09 DIAGNOSIS — N319 Neuromuscular dysfunction of bladder, unspecified: Secondary | ICD-10-CM | POA: Diagnosis present

## 2021-09-09 DIAGNOSIS — I480 Paroxysmal atrial fibrillation: Secondary | ICD-10-CM | POA: Diagnosis present

## 2021-09-09 DIAGNOSIS — N390 Urinary tract infection, site not specified: Secondary | ICD-10-CM | POA: Diagnosis present

## 2021-09-09 DIAGNOSIS — Z7901 Long term (current) use of anticoagulants: Secondary | ICD-10-CM | POA: Diagnosis not present

## 2021-09-09 DIAGNOSIS — A419 Sepsis, unspecified organism: Secondary | ICD-10-CM | POA: Diagnosis present

## 2021-09-09 DIAGNOSIS — Z86711 Personal history of pulmonary embolism: Secondary | ICD-10-CM | POA: Diagnosis not present

## 2021-09-09 DIAGNOSIS — B952 Enterococcus as the cause of diseases classified elsewhere: Secondary | ICD-10-CM | POA: Diagnosis present

## 2021-09-09 DIAGNOSIS — L89154 Pressure ulcer of sacral region, stage 4: Secondary | ICD-10-CM | POA: Diagnosis present

## 2021-09-09 DIAGNOSIS — L89159 Pressure ulcer of sacral region, unspecified stage: Secondary | ICD-10-CM

## 2021-09-09 DIAGNOSIS — L8990 Pressure ulcer of unspecified site, unspecified stage: Secondary | ICD-10-CM | POA: Insufficient documentation

## 2021-09-09 DIAGNOSIS — Z20822 Contact with and (suspected) exposure to covid-19: Secondary | ICD-10-CM | POA: Diagnosis present

## 2021-09-09 DIAGNOSIS — T83511A Infection and inflammatory reaction due to indwelling urethral catheter, initial encounter: Principal | ICD-10-CM | POA: Diagnosis present

## 2021-09-09 DIAGNOSIS — Z79899 Other long term (current) drug therapy: Secondary | ICD-10-CM

## 2021-09-09 DIAGNOSIS — Y846 Urinary catheterization as the cause of abnormal reaction of the patient, or of later complication, without mention of misadventure at the time of the procedure: Secondary | ICD-10-CM | POA: Diagnosis present

## 2021-09-09 DIAGNOSIS — G904 Autonomic dysreflexia: Secondary | ICD-10-CM | POA: Diagnosis present

## 2021-09-09 DIAGNOSIS — L89899 Pressure ulcer of other site, unspecified stage: Secondary | ICD-10-CM | POA: Diagnosis present

## 2021-09-09 DIAGNOSIS — G8250 Quadriplegia, unspecified: Secondary | ICD-10-CM | POA: Diagnosis present

## 2021-09-09 DIAGNOSIS — Z66 Do not resuscitate: Secondary | ICD-10-CM | POA: Diagnosis present

## 2021-09-09 DIAGNOSIS — Z8673 Personal history of transient ischemic attack (TIA), and cerebral infarction without residual deficits: Secondary | ICD-10-CM | POA: Diagnosis not present

## 2021-09-09 DIAGNOSIS — I96 Gangrene, not elsewhere classified: Secondary | ICD-10-CM | POA: Diagnosis present

## 2021-09-09 LAB — APTT: aPTT: 32 seconds (ref 24–36)

## 2021-09-09 LAB — LACTIC ACID, PLASMA
Lactic Acid, Venous: 1.4 mmol/L (ref 0.5–1.9)
Lactic Acid, Venous: 1.8 mmol/L (ref 0.5–1.9)

## 2021-09-09 LAB — COMPREHENSIVE METABOLIC PANEL
ALT: 12 U/L (ref 0–44)
AST: 16 U/L (ref 15–41)
Albumin: 2.7 g/dL — ABNORMAL LOW (ref 3.5–5.0)
Alkaline Phosphatase: 61 U/L (ref 38–126)
Anion gap: 7 (ref 5–15)
BUN: 17 mg/dL (ref 8–23)
CO2: 22 mmol/L (ref 22–32)
Calcium: 8.7 mg/dL — ABNORMAL LOW (ref 8.9–10.3)
Chloride: 105 mmol/L (ref 98–111)
Creatinine, Ser: 0.42 mg/dL — ABNORMAL LOW (ref 0.61–1.24)
GFR, Estimated: >60 mL/min (ref >60)
Glucose, Bld: 114 mg/dL — ABNORMAL HIGH (ref 70–99)
Potassium: 4.7 mmol/L (ref 3.5–5.1)
Sodium: 134 mmol/L — ABNORMAL LOW (ref 135–145)
Total Bilirubin: 0.7 mg/dL (ref 0.3–1.2)
Total Protein: 5.6 g/dL — ABNORMAL LOW (ref 6.5–8.1)

## 2021-09-09 LAB — CBC WITH DIFFERENTIAL/PLATELET
Abs Immature Granulocytes: 0.11 10*3/uL — ABNORMAL HIGH (ref 0.00–0.07)
Basophils Absolute: 0 10*3/uL (ref 0.0–0.1)
Basophils Relative: 0 %
Eosinophils Absolute: 0 10*3/uL (ref 0.0–0.5)
Eosinophils Relative: 0 %
HCT: 36.5 % — ABNORMAL LOW (ref 39.0–52.0)
Hemoglobin: 12.1 g/dL — ABNORMAL LOW (ref 13.0–17.0)
Immature Granulocytes: 1 %
Lymphocytes Relative: 3 %
Lymphs Abs: 0.4 10*3/uL — ABNORMAL LOW (ref 0.7–4.0)
MCH: 31.3 pg (ref 26.0–34.0)
MCHC: 33.2 g/dL (ref 30.0–36.0)
MCV: 94.6 fL (ref 80.0–100.0)
Monocytes Absolute: 1 10*3/uL (ref 0.1–1.0)
Monocytes Relative: 7 %
Neutro Abs: 12.2 10*3/uL — ABNORMAL HIGH (ref 1.7–7.7)
Neutrophils Relative %: 89 %
Platelets: 178 10*3/uL (ref 150–400)
RBC: 3.86 MIL/uL — ABNORMAL LOW (ref 4.22–5.81)
RDW: 14.5 % (ref 11.5–15.5)
WBC: 13.7 10*3/uL — ABNORMAL HIGH (ref 4.0–10.5)
nRBC: 0 % (ref 0.0–0.2)

## 2021-09-09 LAB — URINALYSIS, ROUTINE W REFLEX MICROSCOPIC
Glucose, UA: NEGATIVE mg/dL
Ketones, ur: NEGATIVE mg/dL
Nitrite: POSITIVE — AB
Protein, ur: >300 mg/dL — AB
Specific Gravity, Urine: 1.025 (ref 1.005–1.030)
pH: 6.5 (ref 5.0–8.0)

## 2021-09-09 LAB — URINALYSIS, MICROSCOPIC (REFLEX): RBC / HPF: >50 RBC/hpf (ref 0–5)

## 2021-09-09 LAB — RESP PANEL BY RT-PCR (FLU A&B, COVID) ARPGX2
Influenza A by PCR: NEGATIVE
Influenza B by PCR: NEGATIVE
SARS Coronavirus 2 by RT PCR: NEGATIVE

## 2021-09-09 LAB — PROTIME-INR
INR: 1.1 (ref 0.8–1.2)
Prothrombin Time: 14.3 seconds (ref 11.4–15.2)

## 2021-09-09 IMAGING — CT CT ABD-PELV W/ CM
2 of 5 series · 16 of 46 positions shown, 18 images · IV contrast (Omni 300)
Comparison: Prior CT of the abdomen and pelvis on [DATE]

CLINICAL DATA: Sepsis and sacral decubitus ulcer with concern for
deep infection.

EXAM:
CT ABDOMEN AND PELVIS WITH CONTRAST
TECHNIQUE: Multidetector CT imaging of the abdomen and pelvis was performed
using the standard protocol following bolus administration of
intravenous contrast.

[Series 3: a/p w/ 5mm · axial · 0.87mm/px · z∈[+743,+1208]mm · 13 of 105 slices shown, 15 images]
[im 6/105  soft-tissue]
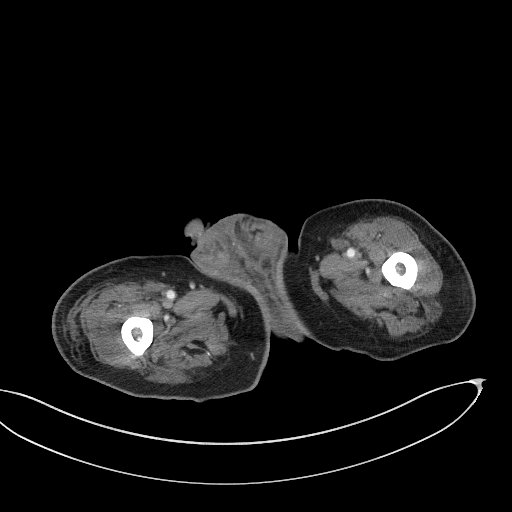
[im 6/105  bone]
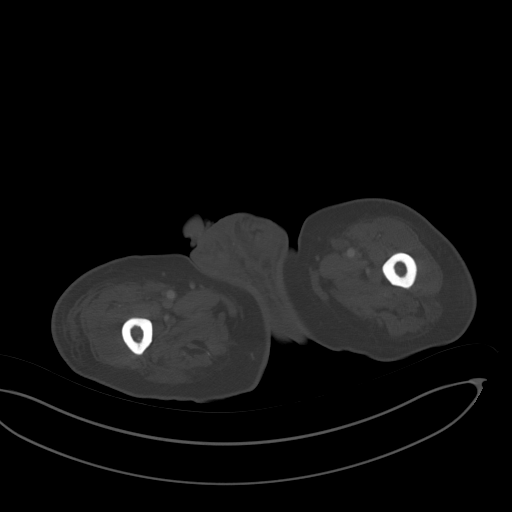
[im 17/105  soft-tissue]
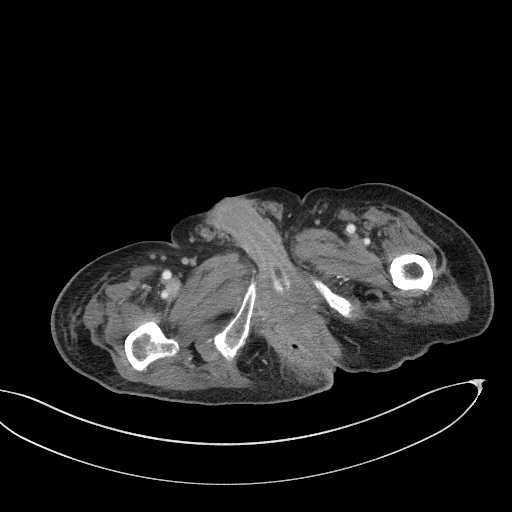
[im 22/105  soft-tissue]
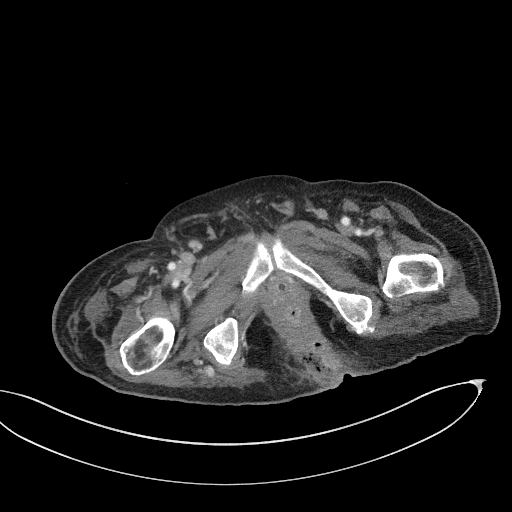
[im 28/105  soft-tissue]
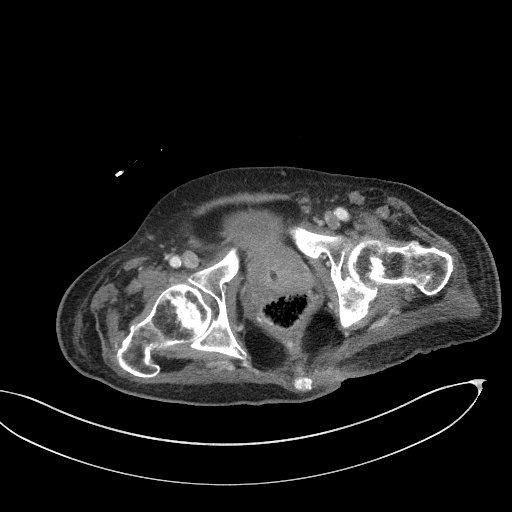
[im 39/105  soft-tissue]
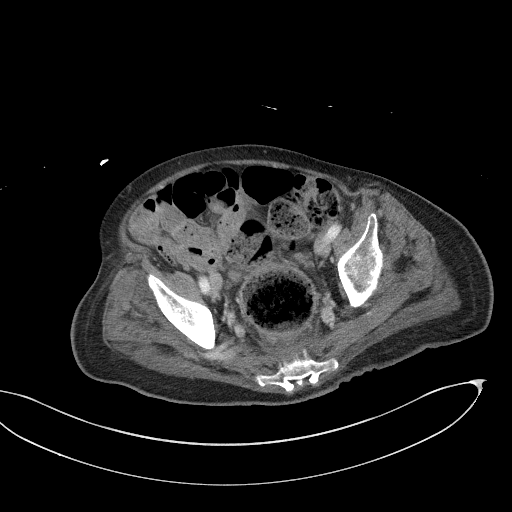
[im 44/105  soft-tissue]
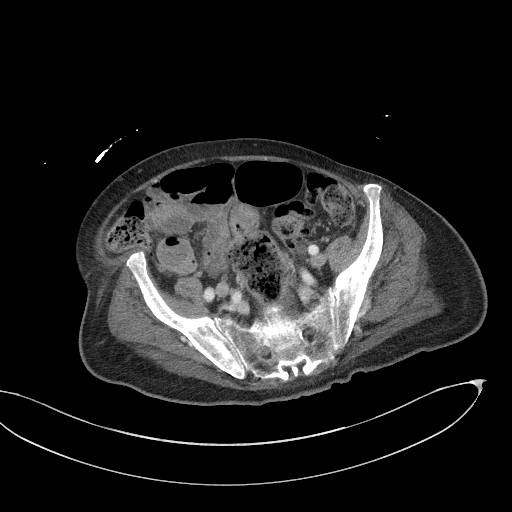
[im 55/105  soft-tissue]
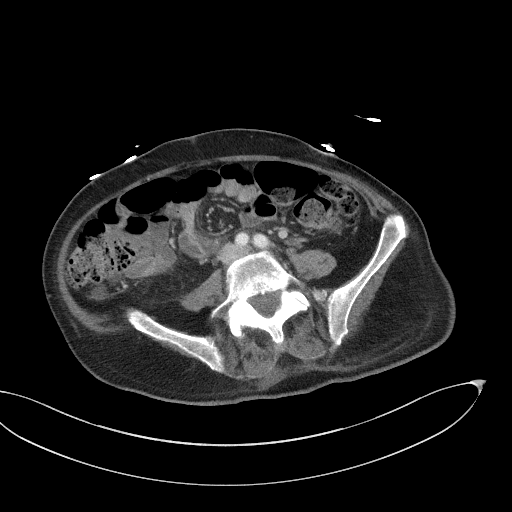
[im 61/105  soft-tissue]
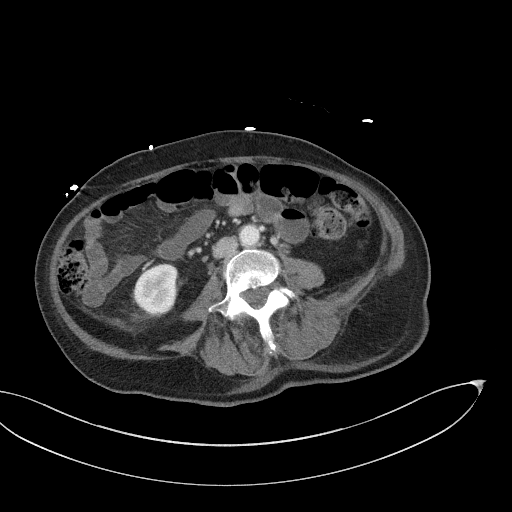
[im 66/105  soft-tissue]
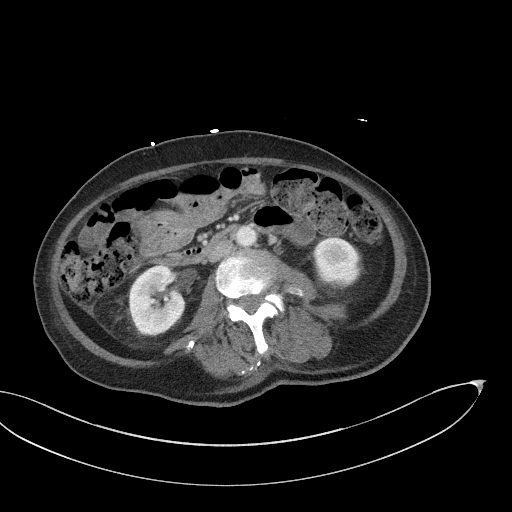
[im 66/105  bone]
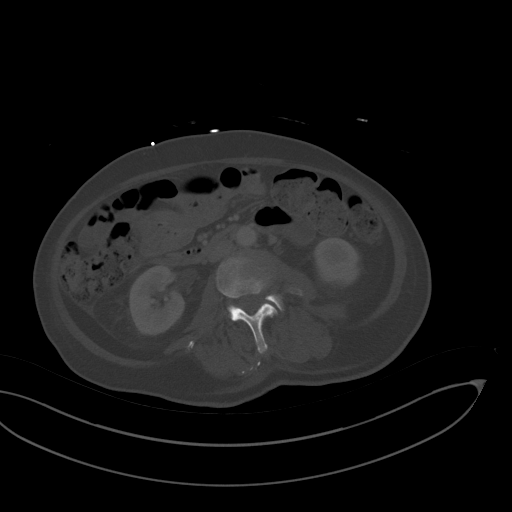
[im 77/105  soft-tissue]
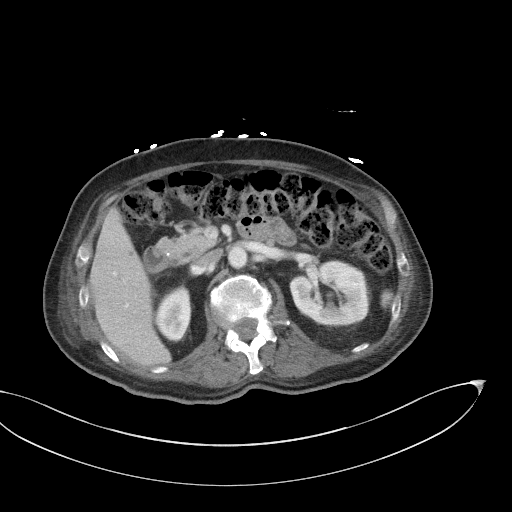
[im 83/105  soft-tissue]
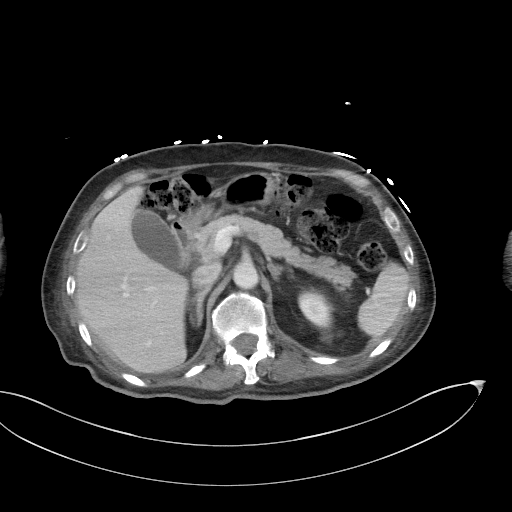
[im 88/105  soft-tissue]
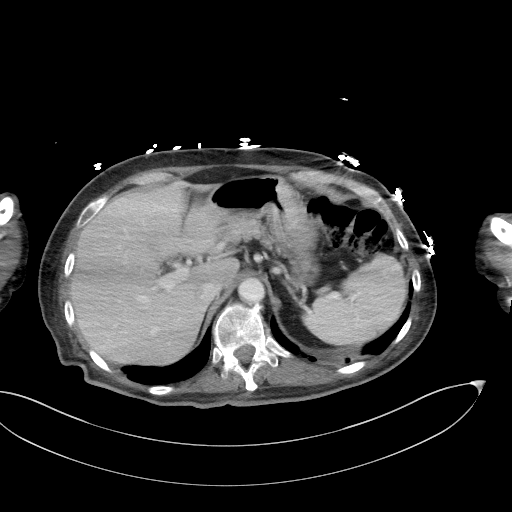
[im 99/105  soft-tissue]
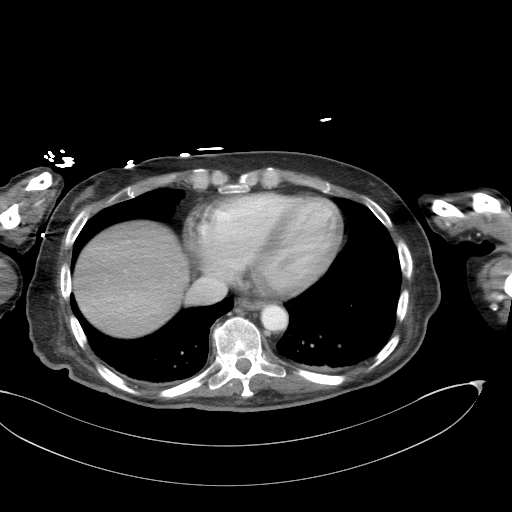

[Series 6: a/p w/ cor · coronal · 0.87mm/px · 3 of 128 slices shown]
[im 43/128  soft-tissue]
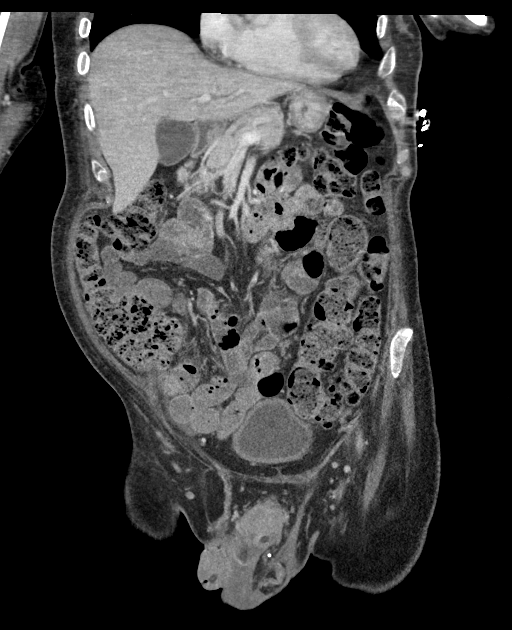
[im 57/128  soft-tissue]
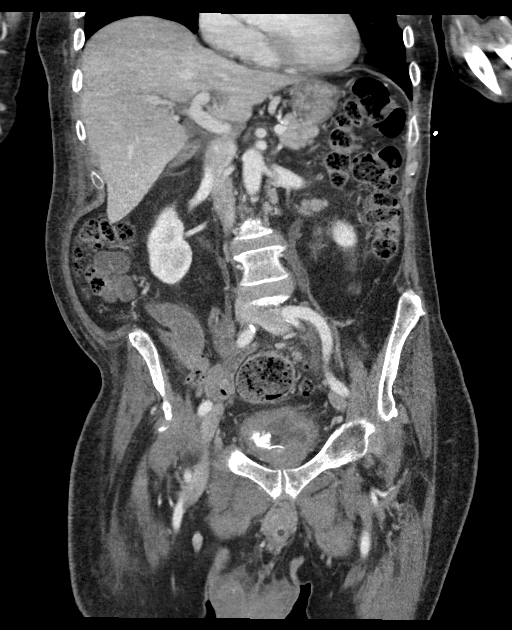
[im 71/128  soft-tissue]
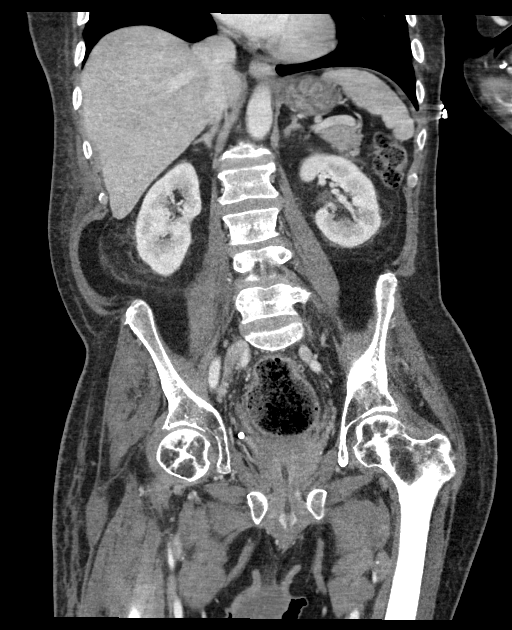

[16 of 46 positions shown; findings below may reference images not displayed]

RADIATION DOSE REDUCTION: This exam was performed according to the
departmental dose-optimization program which includes automated
exposure control, adjustment of the mA and/or kV according to
patient size and/or use of iterative reconstruction technique.

CONTRAST:  100mL OMNIPAQUE IOHEXOL 300 MG/ML  SOLN
FINDINGS: Lower chest: No acute abnormality.

Hepatobiliary: No focal liver abnormality is seen. No gallstones,
gallbladder wall thickening, or biliary dilatation.

Pancreas: No evidence of pancreatitis or mass. Stable mild
prominence of the pancreatic duct at the level of the pancreatic
head

Spleen: Normal in size without focal abnormality.

Adrenals/Urinary Tract: Normal adrenal glands. Stable posterior
upper pole left renal cyst which appears benign and requires no
follow-up. No hydronephrosis. Interval development calculi in the
dependent bladder.

Stomach/Bowel: Moderate fecal material in the colon including more
focal stool ball in the rectum, similar to the prior CT. No evidence
of overt bowel obstruction, free intraperitoneal air or bowel
lesion.

Vascular/Lymphatic: Mild atherosclerosis of the abdominal aorta and
iliac arteries without aneurysm. No lymphadenopathy identified.

Reproductive: Prostate is unremarkable.

Other: New decubitus ulceration at the tip of the coccyx and
extending inferiorly to the level of the perianal region. In this
region there is serpiginous areas of fluid in air consistent with
infection that tracks into the deep perianal region. The skin defect
is to the left of midline and inflammation tracks to the right of
the anal region. A discrete measurable abscess is not present at
this time.

Musculoskeletal: No evidence of osteomyelitis.
IMPRESSION: Decubitus ulcer near the tip of the coccyx with infection tracking
into the perianal region. A discrete abscess is not present at this
time. No evidence of associated overt osteomyelitis.

## 2021-09-09 IMAGING — DX DG CHEST 1V PORT
1 series · 2 of 2 positions shown · non-contrast
Comparison: [DATE] chest radiograph.

CLINICAL DATA: Possible sepsis

EXAM:
PORTABLE CHEST 1 VIEW

[Series 1: chest · 0.14mm/px · 2 of 2 slices shown]
[im 1/2]
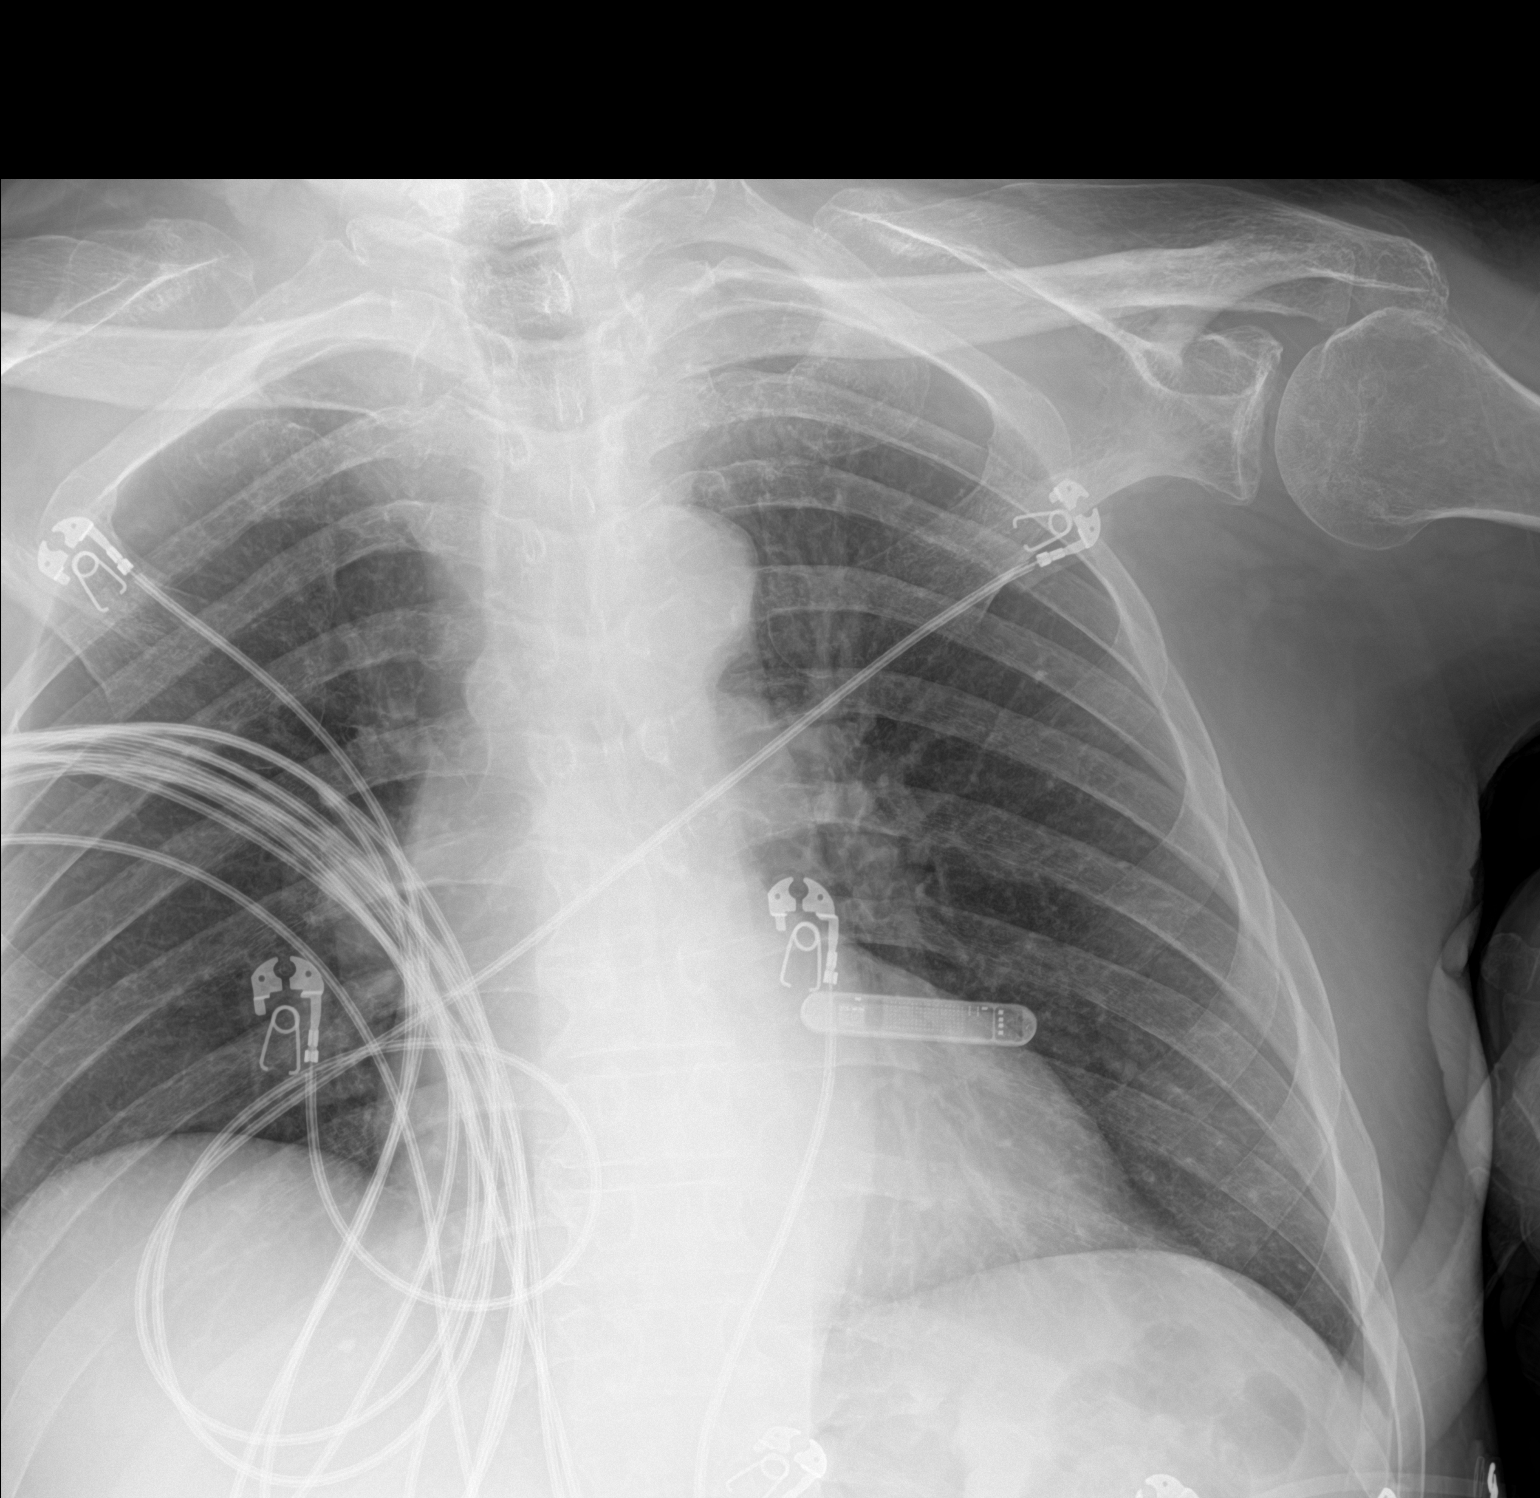
[im 2/2]
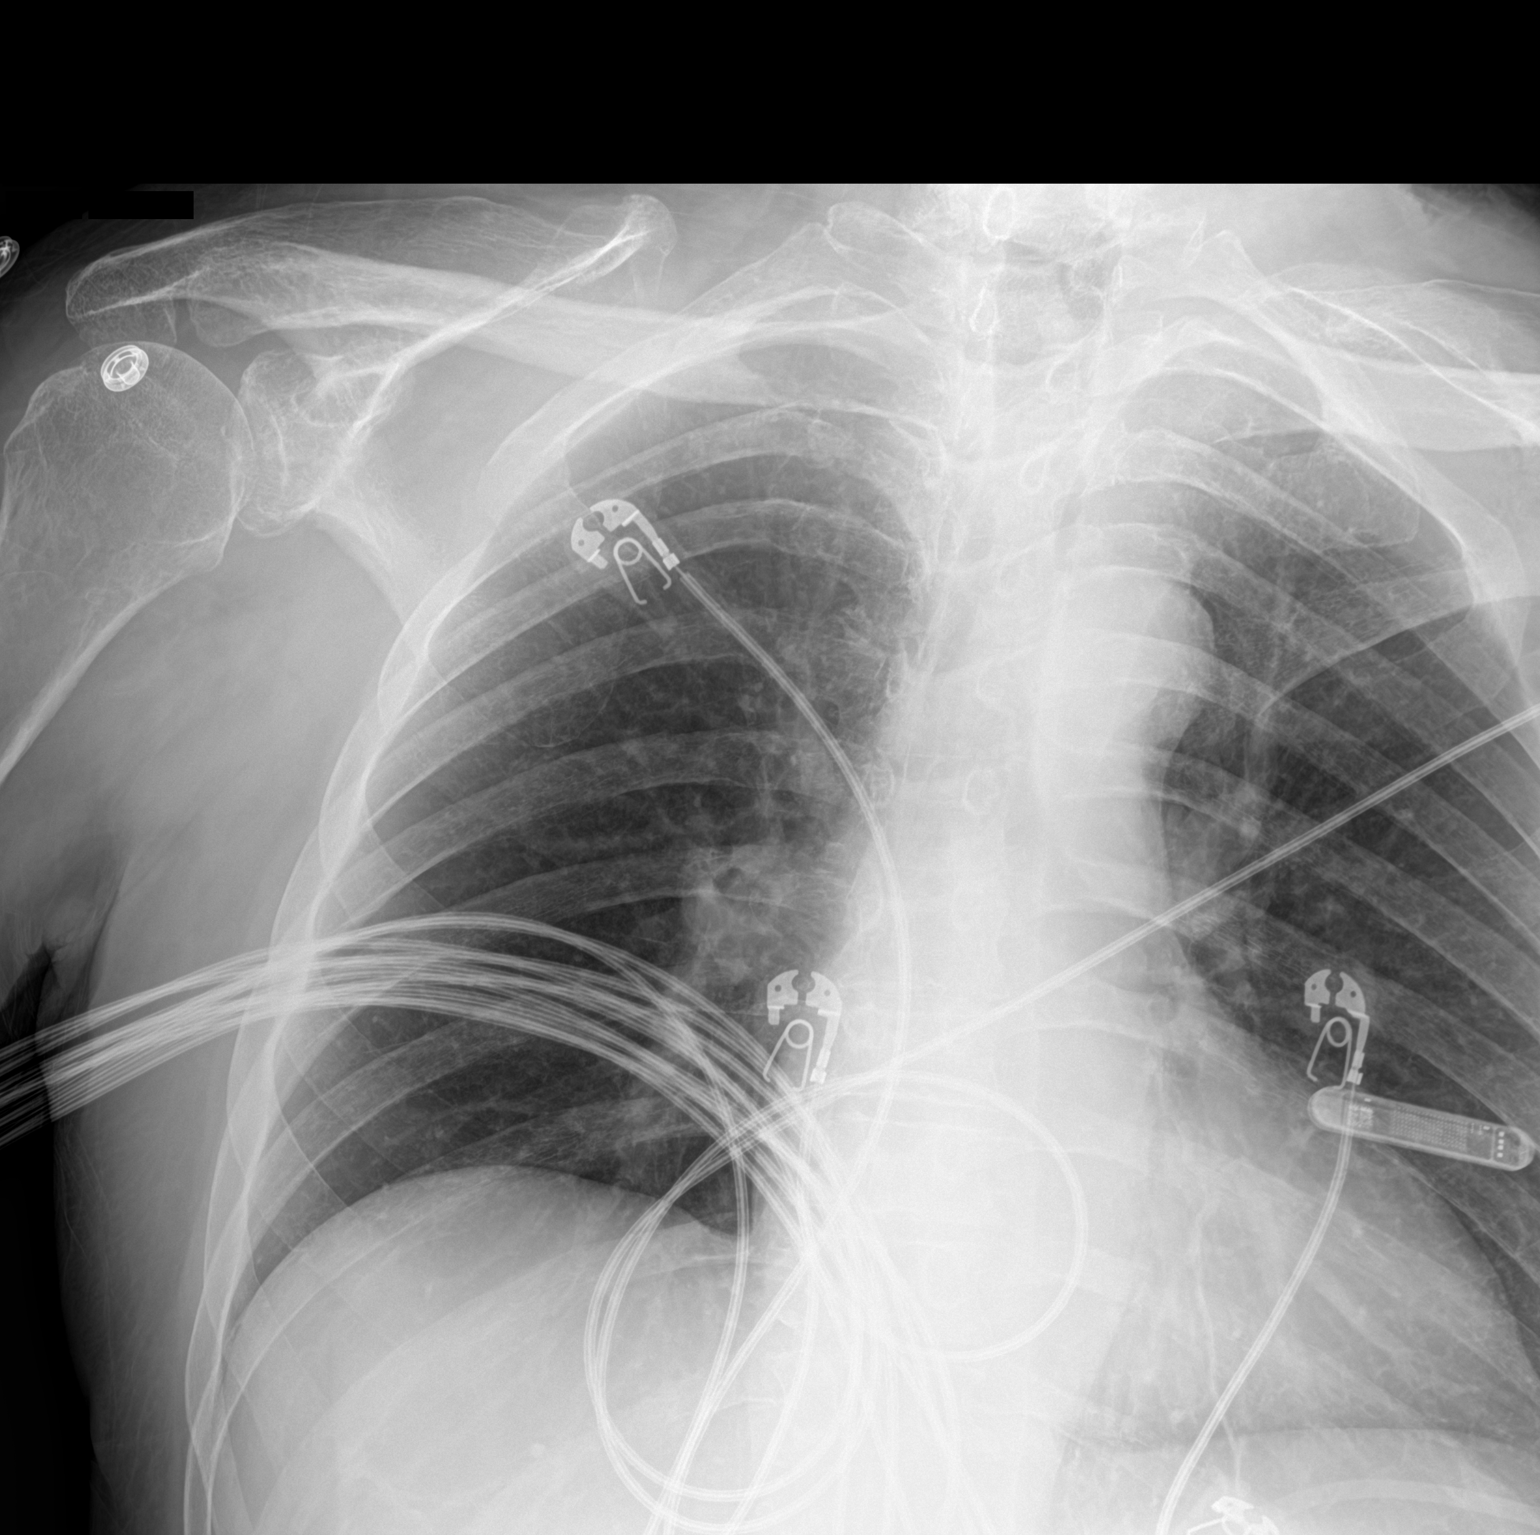

[2 of 2 positions shown; findings below may reference images not displayed]

FINDINGS: Loop recorder overlies the left heart. Stable cardiomediastinal
silhouette with normal heart size. No pneumothorax. No pleural
effusion. Lungs appear clear, with no acute consolidative airspace
disease and no pulmonary edema.
IMPRESSION: No active disease.

## 2021-09-09 MED ORDER — CEFEPIME HCL 2 G IV SOLR
2.0000 g | Freq: Three times a day (TID) | INTRAVENOUS | Status: DC
  Administered 2021-09-09 – 2021-09-11 (×5): 2 g via INTRAVENOUS
  Filled 2021-09-09 (×5): qty 12.5

## 2021-09-09 MED ORDER — ALBUTEROL SULFATE (2.5 MG/3ML) 0.083% IN NEBU: 2.5000 mg | INHALATION_SOLUTION | Freq: Four times a day (QID) | RESPIRATORY_TRACT | Status: DC | PRN

## 2021-09-09 MED ORDER — FLUDROCORTISONE ACETATE 0.1 MG PO TABS
0.1000 mg | ORAL_TABLET | Freq: Every day | ORAL | Status: DC
  Administered 2021-09-09 – 2021-09-12 (×4): 0.1 mg via ORAL
  Filled 2021-09-09 (×4): qty 1

## 2021-09-09 MED ORDER — DIPHENHYDRAMINE HCL 25 MG PO CAPS
25.0000 mg | ORAL_CAPSULE | Freq: Every evening | ORAL | Status: DC | PRN
  Administered 2021-09-09 – 2021-09-11 (×3): 25 mg via ORAL
  Filled 2021-09-09 (×3): qty 1

## 2021-09-09 MED ORDER — ACETAMINOPHEN 650 MG RE SUPP: 650.0000 mg | Freq: Four times a day (QID) | RECTAL | Status: DC | PRN

## 2021-09-09 MED ORDER — SODIUM CHLORIDE 0.9 % IV SOLN
2.0000 g | Freq: Once | INTRAVENOUS | Status: AC
  Administered 2021-09-09: 2 g via INTRAVENOUS
  Filled 2021-09-09: qty 12.5

## 2021-09-09 MED ORDER — SALINE SPRAY 0.65 % NA SOLN
1.0000 | Freq: Every day | NASAL | Status: DC | PRN
  Filled 2021-09-09: qty 44

## 2021-09-09 MED ORDER — RIVAROXABAN 20 MG PO TABS
20.0000 mg | ORAL_TABLET | Freq: Every day | ORAL | Status: DC
  Administered 2021-09-09 – 2021-09-12 (×4): 20 mg via ORAL
  Filled 2021-09-09: qty 2
  Filled 2021-09-09 (×3): qty 1

## 2021-09-09 MED ORDER — ACETAMINOPHEN 325 MG PO TABS
650.0000 mg | ORAL_TABLET | Freq: Four times a day (QID) | ORAL | Status: DC | PRN
  Administered 2021-09-09: 650 mg via ORAL
  Filled 2021-09-09 (×2): qty 2

## 2021-09-09 MED ORDER — LACTATED RINGERS IV BOLUS (SEPSIS)
1000.0000 mL | Freq: Once | INTRAVENOUS | Status: AC
  Administered 2021-09-09: 1000 mL via INTRAVENOUS

## 2021-09-09 MED ORDER — DIPHENHYDRAMINE-APAP (SLEEP) 25-500 MG PO TABS: 1.0000 | ORAL_TABLET | Freq: Every day | ORAL | Status: DC | PRN

## 2021-09-09 MED ORDER — SODIUM CHLORIDE 0.9% FLUSH
3.0000 mL | Freq: Two times a day (BID) | INTRAVENOUS | Status: DC
  Administered 2021-09-09 – 2021-09-12 (×6): 3 mL via INTRAVENOUS

## 2021-09-09 MED ORDER — ACETAMINOPHEN 500 MG PO TABS
500.0000 mg | ORAL_TABLET | Freq: Every evening | ORAL | Status: DC | PRN
  Administered 2021-09-10 – 2021-09-11 (×2): 500 mg via ORAL
  Filled 2021-09-09: qty 1

## 2021-09-09 MED ORDER — LACTATED RINGERS IV SOLN: INTRAVENOUS | Status: AC

## 2021-09-09 MED ORDER — IOHEXOL 300 MG/ML  SOLN
100.0000 mL | Freq: Once | INTRAMUSCULAR | Status: AC | PRN
  Administered 2021-09-09: 100 mL via INTRAVENOUS

## 2021-09-09 NOTE — Progress Notes (Signed)
Pharmacy Antibiotic Note ? ?[name] is a 76 y.o. male admitted on 09/09/2021 with UTI and wound infection.  Pharmacy has been consulted for cefepime dosing. ? ?WBC elevated  ? ?Plan: ?-Cefepime 2 gm IV Q 8 hours ?-Monitor CBC, renal fx, cultures and clinical progress ? ?Height: 5\' 8"  (172.7 cm) ?Weight: 59 kg (130 lb) ?IBW/kg (Calculated) : 68.4 ? ?Temp (24hrs), Avg:101.6 ?F (38.7 ?C), Min:101.6 ?F (38.7 ?C), Max:101.6 ?F (38.7 ?C) ? ?No results for input(s): WBC, CREATININE, LATICACIDVEN, VANCOTROUGH, VANCOPEAK, VANCORANDOM, GENTTROUGH, GENTPEAK, GENTRANDOM, TOBRATROUGH, TOBRAPEAK, TOBRARND, AMIKACINPEAK, AMIKACINTROU, AMIKACIN in the last 168 hours.  ?CrCl cannot be calculated (Patient's most recent lab result is older than the maximum 21 days allowed.).   ? ?No Known Allergies ? ?Antimicrobials this admission: ?Cefepime 5/8 >>  ? ?Dose adjustments this admission: ? ? ?Microbiology results: ?5/8 BCx:  ?5/8 UCx:   ? ?Thank you for allowing pharmacy to be a part of this patient?s care. ? ?Albertina Parr, PharmD., BCCCP ?Clinical Pharmacist ?Please refer to [facility] for unit-specific pharmacist  ? ?

## 2021-09-09 NOTE — H&P (Signed)
?History and Physical  ? ? ?Patient: [name] BWI:[id number] DOB: [birth year] ?DOA: 09/09/2021 ?DOS: the patient was seen and examined on 09/09/2021 ?PCP: Pcp, No  ?Patient coming from: Home ? ?Chief Complaint:  ?Chief Complaint  ?Patient presents with  ? Fever  ? ?HPI: [name] is a 76 y.o. 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  History is obtained from the patient with assistance of his wife present bedside.  Symptoms started 2 days ago with the patient shaking uncontrollably and his wife who is his primary caregiver noted that his fever was elevated up to 101 ?F.  At that time his primary care sent in a prescription for Augmentin which the patient has been taking as prescribed.  He had been having issues with his Foley catheter for the last 3 weeks which there was concern for possible blockage or that the catheter size was incorrect when it was last placed on 4/11.  Recently she had noted a dark discharge coming from his Foley catheter.  The company was scheduled to come back out tomorrow.  His wife also reports that he has a pressure ulcer of the sacrum that she has been cleaning and placing Betadine on regularly.  Usually it drained bright red blood, but it had turned dark in color here recently which is wife was concerned that it was possibly infected.  The patient had developed fever and chills again today for which she called EMS as she feels antibiotics have not helped.. ? ?Patient into the emergency department patient was noted to be febrile up to 101.6 ?F, respirations 20-25, and all other vital signs maintained.  Labs significant for WBC 13.7, hemoglobin 12.1, sodium 134, and lactic acid 1.4.  Urinalysis  revealed large hemoglobin, small leukocytes, positive nitrates, few bacteria, greater than 50 RBCs/hpf.  Patient's Foley catheter was changed out in the ED.  Blood and urine cultures had been obtained. Patient had been given 1 L of lactated Ringer's and started on cefepime. ? ?Review of Systems: As mentioned in the history of present illness. All other systems reviewed and are negative. ?Past Medical History:  ?Diagnosis Date  ? CVA (cerebral vascular accident) [facility])   ? ?Past Surgical History:  ?Procedure Laterality Date  ? LOOP RECORDER INSERTION N/A 12/13/2020  ? Procedure: LOOP RECORDER INSERTION;  Surgeon: [name], MD;  Location: [facility];  Service: Cardiovascular;  Laterality: N/A;  ? TONSILLECTOMY    ? ?Social History:  reports that he has never smoked. He has never used smokeless tobacco. He reports that he does not currently use alcohol. He reports that he does not use drugs. ? ?No Known Allergies ? ?Family History  ?Problem Relation Age of Onset  ? Cancer Mother   ? Dementia Mother   ? Heart attack Father   ? Clotting disorder Father   ? ? ?Prior to Admission medications   ?Medication Sig Start Date End Date Taking? Authorizing Provider  ?acetaminophen (TYLENOL) 325 MG tablet Take 2 tablets (650 mg total) by mouth every 4 (four) hours as needed for mild pain (or temp > 37.5 C (99.5 F)). 01/09/21   Angiulli, Lavon Paganini, PA-C  ?Ascorbic Acid (VITAMIN C PO) Take 1 tablet by mouth daily.    [provider]  ?atorvastatin (LIPITOR) 80 MG  tablet Take 1 tablet (80 mg total) by mouth daily. 01/09/21 02/08/21  Angiulli, Lavon Paganini, PA-C  ?Baclofen 5 MG TABS Take 5 mg by mouth 3 (three) times daily. ?Patient not taking: Reported on 05/05/2021 03/27/21   Marcial Pacas, MD  ?bisacodyl (DULCOLAX) 10 MG suppository Place 1 suppository (10 mg total) rectally daily at 8 pm. 01/09/21   Angiulli, Lavon Paganini, PA-C  ?Cholecalciferol (VITAMIN D3 PO) Take 1 tablet by mouth daily.    [provider]   ?Cyanocobalamin (VITAMIN B-12 PO) Take 1 drop by mouth daily.    [provider]  ?diphenhydrAMINE-APAP, sleep, (TYLENOL PM EXTRA STRENGTH PO) Take 1 tablet by mouth as needed (sleep).    [provider]  ?fludrocortisone (FLORINEF) 0.1 MG tablet Take 1 tablet (0.1 mg total) by mouth daily. For orthostatic hypotension/low BP 03/06/21   Lovorn, Jinny Blossom, MD  ?lidocaine (LIDODERM) 5 % Place 3 patches onto the skin daily. Remove & Discard patch within 12 hours or as directed by MD ?Patient not taking: Reported on 05/05/2021 01/09/21   [place], Lavon Paganini, PA-C  ?Multiple Vitamin (MULTIVITAMIN WITH MINERALS) TABS tablet Take 1 tablet by mouth daily.    [provider]  ?pantoprazole (PROTONIX) 40 MG tablet Take 1 tablet (40 mg total) by mouth daily. 01/09/21 02/08/21  Angiulli, Lavon Paganini, PA-C  ?rivaroxaban (XARELTO) 20 MG TABS tablet Take 1 tablet (20 mg total) by mouth daily with supper. 06/03/21   [name], MD  ?RIVAROXABAN Alveda Reasons) VTE STARTER PACK (15 & 20 MG) Follow package directions: Take one 60m tablet by mouth twice a day. On day 22, switch to one 211mtablet once a day. Take with food. 05/06/21   MuSid FalconMD  ? ? ?Physical Exam: ?Vitals:  ? 09/09/21 1411 09/09/21 1415 09/09/21 1430 09/09/21 1445  ?BP:  (!) 155/133 (!) 152/73 127/78  ?Pulse:  88 97 92  ?Resp:  20 (!) 25 (!) 21  ?Temp:      ?TempSrc:      ?SpO2:  99% 98% 98%  ?Weight: 59 kg     ?Height: _0  (1.727 m)     ? ? ?Constitutional: Elderly male who appears in no acute distress ?Eyes: PERRL, lids and conjunctivae normal ?ENMT: Mucous membranes are dry.  Posterior pharynx clear of any exudate or lesions.  ?Neck: normal, supple, no masses, no thyromegaly ?Respiratory: Clear to auscultation without significant wheezes or rhonchi appreciated. ?Cardiovascular: Regular rate and rhythm, no murmurs / rubs / gallops. No extremity edema.   ?Abdomen: no tenderness.  Bowel sounds present. ?Musculoskeletal: Contractures noted of  the bilateral upper and lower extremities. ?Skin: Pressure ulcer of the sacrum with small hole present at the superior aspect of the wound draining dark material. ? ? ?Neurologic: Paraplegic ?Psychiatric: Normal judgment and insight. Alert and oriented x 3. Normal mood.  ? ?Data Reviewed: ? ?EKG revealed possible atrial fibrillation at 93 bpm.  Reviewed labs, imaging, and pertinent records as noted above in HPI. ? ?Assessment and Plan: ?Sepsis secondary to complicated urinary tract infection neurogenic bladder ?Patient has a chronic indwelling Foley catheter secondary to neurogenic bladder from prior spinal infarction.  Wife had noted discoloration of urine with issues with the Foley catheter over the last couple weeks.  Due to the chills patient had been started on Augmentin.  However the fever and chills persisted despite taking medication for the last 2 days.  Noted to be febrile up to 101.6 ?F and the ED with white blood  cell count elevated at 13.7.  Lactic acid was reassuring at 1.4.  Patient has been started on empiric antibiotics of cefepime and the catheter has been exchanged in the ED. ?-Admit to medical telemetry bed ?-Follow-up blood and urine cultures ?-Continue empiric antibiotics with cefepime ?-Tylenol as needed for fever ? ?Pressure ulcer ?Acute on chronic.  Patient has a pressure ulcer of the sacrum that appears a small opening at the superior aspect of the wound draining dark brown fluid. ?-Low-air-loss mattress replacement ?-Check ESR and CRP ?-Check a CT scan of the abdomen pelvis with contrast ?-Wound care consult ? ?History of PE on chronic anticoagulation ?Paroxysmal atrial fibrillation ?Patient was found to be in atrial fibrillation, but appears to be rate control and had have bilateral pulmonary embolism during hospitalization from 05/2021.  Heart rates are currently controlled. ?-Continue Xarelto ? ?Quadriplegic secondary to history of spinal cord and cerebral strokes ?Patient suffered  multiple embolic strokes in 06/8116 which resulted in patient being quadriplegic with neurogenic bladder. ? ?Autonomic dysreflexia ?DVT prophylaxis: Xarelto ? ?History of lesion on thoracic spine ? ?Advance Care Planning:

## 2021-09-09 NOTE — Progress Notes (Signed)
Elink following code sepsis °

## 2021-09-09 NOTE — ED Notes (Signed)
[name] [name]) called to update that patient got room upstairs and will be transported per patient request ?

## 2021-09-09 NOTE — ED Provider Notes (Signed)
?[facility] EMERGENCY DEPARTMENT ?Provider Note ? ? ?CSN: [id number] ?Arrival date & time: 09/09/21  1357 ? ?  ? ?History ? ?Chief Complaint  ?Patient presents with  ? Fever  ? ? ?[name] is a 76 y.o. male. ? ? Patient as above with significant medical history as below, including prior stroke with paralysis, indwelling foley who presents to the ED with complaint of fever, pos infection. Pt with indwellign foley, last changed 1 month ago. Worsening sediment, reduced uop last week. Fever at home. Tmax 102. Symptom duration around 5 days. Started on abx by outside Dr for possible uti. He has no nausea or vomiting, does report reduce appetite last few days. He has some post nasal drip/congestion. Non-productive cough. No sick contacts or recent travel.  ? ? ? ?Given tyleol 1 g pta ? ? ?Past Medical History: ?No date: CVA (cerebral vascular accident) ([facility]) ? ?Past Surgical History: ?12/13/2020: LOOP RECORDER INSERTION; N/A ?    Comment:  Procedure: LOOP RECORDER INSERTION;  Surgeon: Lalla Brothers,  ?             Rossie Muskrat, MD;  Location: [facility];  Service:  ?             Cardiovascular;  Laterality: N/A; ?No date: TONSILLECTOMY  ? ? ?The history is provided by the patient. No language interpreter was used.  ?Fever ?Associated symptoms: chills, congestion and cough   ?Associated symptoms: no chest pain, no dysuria, no ear pain, no rash, no sore throat and no vomiting   ? ?  ? ?Home Medications ?Prior to Admission medications   ?Medication Sig Start Date End Date Taking? Authorizing Provider  ?acetaminophen (TYLENOL) 325 MG tablet Take 2 tablets (650 mg total) by mouth every 4 (four) hours as needed for mild pain (or temp > 37.5 C (99.5 F)). 01/09/21   Angiulli, Mcarthur Rossetti, PA-C  ?Ascorbic Acid (VITAMIN C PO) Take 1 tablet by mouth daily.    [provider]  ?atorvastatin (LIPITOR) 80 MG tablet Take 1 tablet (80 mg total) by mouth daily. 01/09/21 02/08/21  Angiulli, Mcarthur Rossetti, PA-C  ?Baclofen 5 MG  TABS Take 5 mg by mouth 3 (three) times daily. ?Patient not taking: Reported on 05/05/2021 03/27/21   Levert Feinstein, MD  ?bisacodyl (DULCOLAX) 10 MG suppository Place 1 suppository (10 mg total) rectally daily at 8 pm. 01/09/21   Angiulli, Mcarthur Rossetti, PA-C  ?Cholecalciferol (VITAMIN D3 PO) Take 1 tablet by mouth daily.    [provider]  ?Cyanocobalamin (VITAMIN B-12 PO) Take 1 drop by mouth daily.    [provider]  ?diphenhydrAMINE-APAP, sleep, (TYLENOL PM EXTRA STRENGTH PO) Take 1 tablet by mouth as needed (sleep).    [provider]  ?fludrocortisone (FLORINEF) 0.1 MG tablet Take 1 tablet (0.1 mg total) by mouth daily. For orthostatic hypotension/low BP 03/06/21   Lovorn, Aundra Millet, MD  ?lidocaine (LIDODERM) 5 % Place 3 patches onto the skin daily. Remove & Discard patch within 12 hours or as directed by MD ?Patient not taking: Reported on 05/05/2021 01/09/21   Angiulli, Mcarthur Rossetti, PA-C  ?Multiple Vitamin (MULTIVITAMIN WITH MINERALS) TABS tablet Take 1 tablet by mouth daily.    [provider]  ?pantoprazole (PROTONIX) 40 MG tablet Take 1 tablet (40 mg total) by mouth daily. 01/09/21 02/08/21  Angiulli, Mcarthur Rossetti, PA-C  ?rivaroxaban (XARELTO) 20 MG TABS tablet Take 1 tablet (20 mg total) by mouth daily with supper. 06/03/21   Steffanie Dunn  T, MD  ?RIVAROXABAN (XARELTO) VTE STARTER PACK (15 & 20 MG) Follow package directions: Take one 15mg  tablet by mouth twice a day. On day 22, switch to one 20mg  tablet once a day. Take with food. 05/06/21   Inez CatalinaMullen, Emily B, MD  ?   ? ?Allergies    ?Patient has no known allergies.   ? ?Review of Systems   ?Review of Systems  ?Constitutional:  Positive for chills, fatigue and fever.  ?     Rigors  ?HENT:  Positive for congestion. Negative for ear pain and sore throat.   ?Eyes:  Negative for pain and visual disturbance.  ?Respiratory:  Positive for cough. Negative for shortness of breath.   ?Cardiovascular:  Negative for chest pain and palpitations.   ?Gastrointestinal:  Negative for abdominal pain and vomiting.  ?Genitourinary:  Negative for dysuria and hematuria.  ?Musculoskeletal:  Negative for arthralgias and back pain.  ?Skin:  Negative for color change and rash.  ?Neurological:  Negative for seizures and syncope.  ? ?Physical Exam ?Updated Vital Signs ?BP 127/78   Pulse 92   Temp (!) 101.6 ?F (38.7 ?C) (Oral)   Resp (!) 21   Ht 5\' 8"  (1.727 m)   Wt 59 kg   SpO2 98%   BMI 19.77 kg/m?  ?Physical Exam ?Vitals and nursing note reviewed.  ?Constitutional:   ?   General: He is not in acute distress. ?   Appearance: He is well-developed. He is ill-appearing and diaphoretic.  ?   Comments: Quadraplegia ?rigors  ?HENT:  ?   Head: Normocephalic and atraumatic.  ?   Right Ear: External ear normal.  ?   Left Ear: External ear normal.  ?   Mouth/Throat:  ?   Mouth: Mucous membranes are moist.  ?Eyes:  ?   General: No scleral icterus. ?Cardiovascular:  ?   Rate and Rhythm: Normal rate and regular rhythm.  ?   Pulses: Normal pulses.  ?   Heart sounds: Normal heart sounds.  ?Pulmonary:  ?   Effort: Pulmonary effort is normal. No respiratory distress.  ?   Breath sounds: Normal breath sounds.  ?Abdominal:  ?   General: Abdomen is flat. There is no distension.  ?   Palpations: Abdomen is soft.  ?   Tenderness: There is no abdominal tenderness.  ?Genitourinary: ?   Comments: Foley with thick yellow/white sediment. Dark brown urine in foley bag ?Musculoskeletal:     ?   General: Normal range of motion.  ?   Cervical back: Normal range of motion.  ?   Right lower leg: No edema.  ?   Left lower leg: No edema.  ?Skin: ?   General: Skin is warm.  ?   Capillary Refill: Capillary refill takes less than 2 seconds.  ?Neurological:  ?   Mental Status: He is alert and oriented to person, place, and time.  ?   GCS: GCS eye subscore is 4. GCS verbal subscore is 5. GCS motor subscore is 6.  ?Psychiatric:     ?   Mood and Affect: Mood normal.     ?   Behavior: Behavior normal.   ? ? ?ED Results / Procedures / Treatments   ?Labs ?(all labs ordered are listed, but only abnormal results are displayed) ?Labs Reviewed  ?COMPREHENSIVE METABOLIC PANEL - Abnormal; Notable for the following components:  ?    Result Value  ? Sodium 134 (*)   ? Glucose, Bld 114 (*)   ?  Creatinine, Ser 0.42 (*)   ? Calcium 8.7 (*)   ? Total Protein 5.6 (*)   ? Albumin 2.7 (*)   ? All other components within normal limits  ?CBC WITH DIFFERENTIAL/PLATELET - Abnormal; Notable for the following components:  ? WBC 13.7 (*)   ? RBC 3.86 (*)   ? Hemoglobin 12.1 (*)   ? HCT 36.5 (*)   ? Neutro Abs 12.2 (*)   ? Lymphs Abs 0.4 (*)   ? Abs Immature Granulocytes 0.11 (*)   ? All other components within normal limits  ?URINALYSIS, ROUTINE W REFLEX MICROSCOPIC - Abnormal; Notable for the following components:  ? Color, Urine BROWN (*)   ? APPearance TURBID (*)   ? Hgb urine dipstick LARGE (*)   ? Bilirubin Urine SMALL (*)   ? Protein, ur >300 (*)   ? Nitrite POSITIVE (*)   ? Leukocytes,Ua SMALL (*)   ? All other components within normal limits  ?URINALYSIS, MICROSCOPIC (REFLEX) - Abnormal; Notable for the following components:  ? Bacteria, UA FEW (*)   ? All other components within normal limits  ?CULTURE, BLOOD (ROUTINE X 2)  ?CULTURE, BLOOD (ROUTINE X 2)  ?RESP PANEL BY RT-PCR (FLU A&B, COVID) ARPGX2  ?URINE CULTURE  ?LACTIC ACID, PLASMA  ?PROTIME-INR  ?APTT  ?LACTIC ACID, PLASMA  ? ? ?EKG ?EKG Interpretation ? ?Date/Time:  Monday Sep 09 2021 14:29:29 EDT ?Ventricular Rate:  97 ?PR Interval:    ?QRS Duration: 121 ?QT Interval:  300 ?QTC Calculation: 381 ?R Axis:   -7 ?Text Interpretation: Atrial fibrillation Nonspecific intraventricular conduction delay ST elevation suggests acute pericarditis Artifact in lead(s) I II aVR aVL aVF Interpretation limited secondary to artifact Confirmed by Tanda Rockers ([id number]) on 09/09/2021 2:40:16 PM ? ?Radiology ?DG Chest Portable 1 View ? ?Result Date: 09/09/2021 ?CLINICAL DATA:  Possible sepsis EXAM:  PORTABLE CHEST 1 VIEW COMPARISON:  05/05/2021 chest radiograph. FINDINGS: Loop recorder overlies the left heart. Stable cardiomediastinal silhouette with normal heart size. No pneumothorax. No pleural effusion. Lungs appear cle

## 2021-09-09 NOTE — ED Triage Notes (Signed)
Per [facility] pt coming from home- has home health and cared by wife. Wife called states pt has been getting amoxicillin for UTI. Having discharge in foley. Fever x 1 week. 1 gram tylenol and 1L NS given by EMS. Patient has unstageable sacral pressure ulcer.  ?

## 2021-09-10 ENCOUNTER — Encounter (HOSPITAL_COMMUNITY): Payer: Self-pay | Admitting: Internal Medicine

## 2021-09-10 DIAGNOSIS — N390 Urinary tract infection, site not specified: Secondary | ICD-10-CM | POA: Diagnosis not present

## 2021-09-10 LAB — CBC
HCT: 32.8 % — ABNORMAL LOW (ref 39.0–52.0)
Hemoglobin: 10.9 g/dL — ABNORMAL LOW (ref 13.0–17.0)
MCH: 31.3 pg (ref 26.0–34.0)
MCHC: 33.2 g/dL (ref 30.0–36.0)
MCV: 94.3 fL (ref 80.0–100.0)
Platelets: 159 10*3/uL (ref 150–400)
RBC: 3.48 MIL/uL — ABNORMAL LOW (ref 4.22–5.81)
RDW: 14.6 % (ref 11.5–15.5)
WBC: 15.2 10*3/uL — ABNORMAL HIGH (ref 4.0–10.5)
nRBC: 0 % (ref 0.0–0.2)

## 2021-09-10 LAB — BASIC METABOLIC PANEL
Anion gap: 6 (ref 5–15)
BUN: 13 mg/dL (ref 8–23)
CO2: 22 mmol/L (ref 22–32)
Calcium: 9 mg/dL (ref 8.9–10.3)
Chloride: 109 mmol/L (ref 98–111)
Creatinine, Ser: 0.46 mg/dL — ABNORMAL LOW (ref 0.61–1.24)
GFR, Estimated: >60 mL/min (ref >60)
Glucose, Bld: 98 mg/dL (ref 70–99)
Potassium: 3.8 mmol/L (ref 3.5–5.1)
Sodium: 137 mmol/L (ref 135–145)

## 2021-09-10 LAB — C-REACTIVE PROTEIN: CRP: 14 mg/dL — ABNORMAL HIGH (ref <1.0)

## 2021-09-10 LAB — SEDIMENTATION RATE: Sed Rate: 51 mm/hr — ABNORMAL HIGH (ref 0–16)

## 2021-09-10 LAB — MRSA NEXT GEN BY PCR, NASAL: MRSA by PCR Next Gen: NOT DETECTED

## 2021-09-10 MED ORDER — CHLORHEXIDINE GLUCONATE CLOTH 2 % EX PADS
6.0000 | MEDICATED_PAD | Freq: Every day | CUTANEOUS | Status: DC
  Administered 2021-09-10 – 2021-09-12 (×2): 6 via TOPICAL

## 2021-09-10 NOTE — Progress Notes (Signed)
New Admission Note:  ? ?Arrival Method: stretcher ?Mental Orientation:alert x4 ?Telemetry: box 19 ?Assessment: Completed ?Skin:see flowsheet ?IV:yes ?Pain: none ?Tubes: foley ?Safety Measures: Safety Fall Prevention Plan has been discussed ?Admission: Completed ?[facility] Orientation: Patient has been orientated to the room, unit and staff.  ?Family: none at side ? ?Orders have been reviewed and implemented. Will continue to monitor the patient. Call light has been placed within reach and bed alarm has been activated.  ? ?Artemio Aly BSN, RN ?Phone number: [phone]  ?

## 2021-09-10 NOTE — Consult Note (Addendum)
WOC Nurse Consult Note: ?Reason for Consult: Consult requested for sacrum wound.  ?CT scan indicates a collection of gas and fluid, no osteomyelitis. Pt states his wife performs dressing changes at home and it was previously red, then became brown with odor this week. ?Wound type: Unstageable pressure injury to the sacrum, 30% brown slough, 70% red and moist.  5X4cm with large amt foul-smelling tan pus draining from a narrow opening in the center.  3 cm depth when swab inserted; bone is palpable so wound is now a Stage for instead of Unstageable; 5X4cm. ?Pressure Injury POA: Yes ?Secure chat message sent to the primary team as follows; "This patient has a sacrum wound with a small narrow opening draining a large amt thick tan pus.  He could benefit from a surgical consult to enlarge the opening at the bedside and facilitate drainage and packing.  Please order if desired." ?Dressing procedure/placement/frequency: Air mattress has already been ordered to reduce pressure. Topical treatment orders provided for bedside nurses to perform as follows to provide antimicrobial benefits: Pack sacrum wound Q day with a narrow strip of Aquacel Kellie Simmering # [phone]), using swab to fill, then cover with foam dressing.  (Change foam dressing Q 3 days or PRN soiling.) ?Please re-consult if further assistance is needed.  Thank-you,  ?Julien Girt MSN, RN, [place], Edie, CNS ?[phone]  ?

## 2021-09-10 NOTE — Procedures (Signed)
1.  Excisional vs non-excisional. Excisional ? ?2.  Tool used for debridement (curette, scapel, etc.)  scissors ? ?3.  Frequency of surgical debridement.   once ? ?4.  Measurement of total devitalized tissue (wound surface) before and after surgical debridement.   1x1cm pre and post ? ?5.  Area and depth of devitalized tissue removed from wound.  1x1cm of tissue removed ? ?6.  Blood loss and description of tissue removed.  No EBL.  Tissue removed with liquified necrotic tissue ? ?7.  Evidence of the progress of the wound's response to treatment. ? A.  Current wound volume (current dimensions and depth).  1x1cm opening with approximately 2.5cm of undermining caudal towards his rectum ? B.  Presence (and extent of) of infection.  Yes ? C.  Presence (and extent of) of non viable tissue.  Yes ? D.  Other material in the wound that is expected to inhibit healing.  No  ? ?8.  Was there any viable tissue removed (measurements): No  ? ?The 1x1cm area of necrotic tissue was excised using scissors.  Copious amounts of thick brown purulent drainage was encountered.  I was able to finally get my pinky finger in this cavity to break up any loculations.  I then used a total of about 50cc of NS to irrigate out the cavity.  The opening is too small to determine the quality of tissue lining the cavity, but the overlying skin is healthy and no need to remove this to only make a larger wound for a patient that will have difficultly healing his wounds due to his quadriplegia.  The wound was then packed with a fluffed out 4x4 NS moistened gauze and covered with a dry dressing. ? ? ?[name] ?11:49 AM ?09/10/2021 ?   ?

## 2021-09-10 NOTE — Consult Note (Signed)
? ? ? ?Lorraine Lax ?[birth year]  ?[id number].   ? ?Requesting MD: Dr. Hughie Closs ?Chief Complaint/Reason for Consult: sacral wound ? 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  His wife and son have been taking care of him at home since this time.  He has some gross function of his arms and legs, but mostly right side.  He has a chronic indwelling foley catheter.  He states his wife has been addressing a small wound on his backside for several months now and and cleaning it with betadine.   ? ?He was admitted yesterday secondary to a UTI.  He underwent a CT scan to further evaluate his sacral ulcer as well.  This did reveal a fluid collection, likely abscess deep to this wound.  WOC saw the patient this morning and noted thick, brown, purulent drainage.  We have been asked to see him for further evaluation and management. ? ?ROS: ?ROS: Please see HPI ? ?Family History  ?Problem Relation Age of Onset  ? Cancer Mother   ? Dementia Mother   ? Heart attack Father   ? Clotting disorder Father   ? ? ?Past Medical History:  ?Diagnosis Date  ? CVA (cerebral vascular accident) [facility])   ? ? ?Past Surgical History:  ?Procedure Laterality Date  ? LOOP RECORDER INSERTION N/A 12/13/2020  ? Procedure: LOOP RECORDER INSERTION;  Surgeon: Lanier Prude, MD;  Location: [facility] INVASIVE CV LAB;  Service: Cardiovascular;  Laterality: N/A;  ? TONSILLECTOMY    ? ? ?Social History:  reports that he has never smoked. He has never used smokeless tobacco. He reports that he does not currently use alcohol. He reports that he does not use drugs. ? ?Allergies: No Known Allergies ? ?Medications Prior to Admission  ?Medication Sig Dispense Refill  ? acetaminophen (TYLENOL) 325 MG tablet Take 2 tablets (650 mg total) by mouth every 4 (four) hours as needed for mild pain (or temp > 37.5 C (99.5 F)).    ? amoxicillin-clavulanate (AUGMENTIN) 875-125 MG tablet Take 1 tablet by  mouth 2 (two) times daily.    ? Ascorbic Acid (VITAMIN C PO) Take 1 tablet by mouth daily.    ? bisacodyl (DULCOLAX) 10 MG suppository Place 1 suppository (10 mg total) rectally daily at 8 pm. (Patient taking differently: Place 10 mg rectally daily as needed for moderate constipation.) 12 suppository 0  ? Cholecalciferol (VITAMIN D3 PO) Take 1 tablet by mouth daily.    ? diphenhydrAMINE-APAP, sleep, (TYLENOL PM EXTRA STRENGTH PO) Take 2 tablets by mouth daily as needed (pain).    ? ELDERBERRY PO Take 1 capsule by mouth daily.    ? fludrocortisone (FLORINEF) 0.1 MG tablet Take 1 tablet (0.1 mg total) by mouth daily. For orthostatic hypotension/low BP (Patient taking differently: Take 0.1 mg by mouth daily.) 30 tablet 5  ? liver oil-zinc oxide (DESITIN) 40 % ointment Apply 1 application. topically daily.    ? Misc Natural Product [facility] EYE DROPS #1 OP) Place 1 drop into both eyes daily as needed (dry eyes).    ? rivaroxaban (XARELTO) 20 MG TABS tablet Take 1 tablet (20 mg total) by mouth daily with supper. 30 tablet 0  ? sodium chloride (OCEAN) 0.65 % nasal spray Place 1 spray into the nose daily as needed for congestion.    ? zinc gluconate 50 MG tablet Take 50 mg by mouth daily.    ?  atorvastatin (LIPITOR) 80 MG tablet Take 1 tablet (80 mg total) by mouth daily. (Patient not taking: Reported on 09/09/2021) 30 tablet 0  ? Baclofen 5 MG TABS Take 5 mg by mouth 3 (three) times daily. (Patient not taking: Reported on 05/05/2021) 90 tablet 2  ? lidocaine (LIDODERM) 5 % Place 3 patches onto the skin daily. Remove & Discard patch within 12 hours or as directed by MD (Patient not taking: Reported on 05/05/2021) 60 patch 0  ? pantoprazole (PROTONIX) 40 MG tablet Take 1 tablet (40 mg total) by mouth daily. (Patient not taking: Reported on 09/09/2021) 30 tablet 0  ? ? ? ?Physical Exam: ?Blood pressure 118/68, pulse 71, temperature 98.8 ?F (37.1 ?C), temperature source Oral, resp. rate 18, height 5\' 8"  (1.727 m), weight 59 kg,  SpO2 98 %. ?General: pleasant, WD, WN white male who is laying in bed in NAD ?HEENT: head is normocephalic, atraumatic.  Sclera are noninjected.  PERRL.  Ears and nose without any masses or lesions.  Mouth is pink and moist ?Heart: regular, rate, and rhythm.  Normal s1,s2. No obvious murmurs, gallops, or rubs noted.  Palpable radial and pedal pulses bilaterally ?Lungs: CTAB, no wheezes, rhonchi, or rales noted.  Respiratory effort nonlabored ?MS: atrophy of his UEs with some contracturing more on the left than the right.  Some minimal gross motor function present R>L.  Legs are symmetrical bilaterally. ?Skin: warm and dry with no masses, lesions, or rashes.  He does have a small 1x1cm unstageable sacral wound (see debridement note) with purulent brown drainage.  He does have some stage II breakdown around the wound opening.  This is beefy red and clean. ?Psych: A&Ox3 with an appropriate affect. ? ? ?Results for orders placed or performed during the hospital encounter of 09/09/21 (from the past 48 hour(s))  ?Comprehensive metabolic panel     Status: Abnormal  ? Collection Time: 09/09/21  2:13 PM  ?Result Value Ref Range  ? Sodium 134 (L) 135 - 145 mmol/L  ? Potassium 4.7 3.5 - 5.1 mmol/L  ? Chloride 105 98 - 111 mmol/L  ? CO2 22 22 - 32 mmol/L  ? Glucose, Bld 114 (H) 70 - 99 mg/dL  ?  Comment: Glucose reference range applies only to samples taken after fasting for at least 8 hours.  ? BUN 17 8 - 23 mg/dL  ? Creatinine, Ser 0.42 (L) 0.61 - 1.24 mg/dL  ? Calcium 8.7 (L) 8.9 - 10.3 mg/dL  ? Total Protein 5.6 (L) 6.5 - 8.1 g/dL  ? Albumin 2.7 (L) 3.5 - 5.0 g/dL  ? AST 16 15 - 41 U/L  ? ALT 12 0 - 44 U/L  ? Alkaline Phosphatase 61 38 - 126 U/L  ? Total Bilirubin 0.7 0.3 - 1.2 mg/dL  ? GFR, Estimated >60 >60 mL/min  ?  Comment: (NOTE) ?Calculated using the CKD-EPI Creatinine Equation (2021) ?  ? Anion gap 7 5 - 15  ?  Comment: Performed at [facility], [address] [place], [place] [address]  ?Lactic acid, plasma      Status: None  ? Collection Time: 09/09/21  2:13 PM  ?Result Value Ref Range  ? Lactic Acid, Venous 1.4 0.5 - 1.9 mmol/L  ?  Comment: Performed at [facility], [address] [place], [place] [address]  ?CBC with Differential     Status: Abnormal  ? Collection Time: 09/09/21  2:13 PM  ?Result Value Ref Range  ? WBC 13.7 (  H) 4.0 - 10.5 K/uL  ? RBC 3.86 (L) 4.22 - 5.81 MIL/uL  ? Hemoglobin 12.1 (L) 13.0 - 17.0 g/dL  ? HCT 36.5 (L) 39.0 - 52.0 %  ? MCV 94.6 80.0 - 100.0 fL  ? MCH 31.3 26.0 - 34.0 pg  ? MCHC 33.2 30.0 - 36.0 g/dL  ? RDW 14.5 11.5 - 15.5 %  ? Platelets 178 150 - 400 K/uL  ? nRBC 0.0 0.0 - 0.2 %  ? Neutrophils Relative % 89 %  ? Neutro Abs 12.2 (H) 1.7 - 7.7 K/uL  ? Lymphocytes Relative 3 %  ? Lymphs Abs 0.4 (L) 0.7 - 4.0 K/uL  ? Monocytes Relative 7 %  ? Monocytes Absolute 1.0 0.1 - 1.0 K/uL  ? Eosinophils Relative 0 %  ? Eosinophils Absolute 0.0 0.0 - 0.5 K/uL  ? Basophils Relative 0 %  ? Basophils Absolute 0.0 0.0 - 0.1 K/uL  ? Immature Granulocytes 1 %  ? Abs Immature Granulocytes 0.11 (H) 0.00 - 0.07 K/uL  ?  Comment: Performed at [facility], [address] [place], [place] [address]  ?Protime-INR     Status: None  ? Collection Time: 09/09/21  2:13 PM  ?Result Value Ref Range  ? Prothrombin Time 14.3 11.4 - 15.2 seconds  ? INR 1.1 0.8 - 1.2  ?  Comment: (NOTE) ?INR goal varies based on device and disease states. ?Performed at [facility], [address] [place], [place] ?[address] ?  ?Sedimentation rate     Status: Abnormal  ? Collection Time: 09/09/21  2:13 PM  ?Result Value Ref Range  ? Sed Rate 51 (H) 0 - 16 mm/hr  ?  Comment: Performed at [facility], [address], [place], [place] [address]  ?C-reactive protein     Status: Abnormal  ? Collection Time: 09/09/21  2:13 PM  ?Result Value Ref Range  ? CRP 14.0 (H) <1.0 mg/dL  ?  Comment: Performed at [facility], [address], [place], [place] [address]  ?Culture, blood (Routine x 2)     Status: None  (Preliminary result)  ? Collection Time: 09/09/21  2:18 PM  ? Specimen: BLOOD  ?Result Value Ref Range  ? Specimen Description BLOOD BLOOD RIGHT FOREARM   ? Special Requests    ?  BOTTLES DRAWN AEROBIC AND

## 2021-09-10 NOTE — Progress Notes (Signed)
?PROGRESS NOTE ? ? ? ?[name] [place]  RUE:[id number]:[id number] DOB: 11/16/1945 DOA: 09/09/2021 ?PCP: Pcp, No ? ? ?Brief Narrative:  ?[name] Anzaldo is a 76 y.o. 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 ? ?He was prescribed Augmentin by PCP which the patient has been taking. He had been having issues with his Foley catheter for the last 3 weeks which there was concern for possible blockage or that the catheter size was incorrect when it was last placed on 4/11.  Recently she had noted a dark discharge coming from his Foley catheter.  His wife also reports that he has a pressure ulcer of the sacrum that she has been cleaning and placing Betadine on regularly.  Usually it drained bright red blood, but it had turned dark in color here recently which is wife was concerned that it was possibly infected.  ?  ?In the ED, patient was noted to be febrile up to 101.6 ?F, respirations 20-25, WBC 13.7, hemoglobin 12.1, sodium 134, and lactic acid 1.4.  Urinalysis revealed large hemoglobin, small leukocytes, positive nitrates, few bacteria, greater than 50 RBCs/hpf.  Patient's Foley catheter was changed out in the ED.  Blood and urine cultures had been obtained. Patient had been given 1 L of lactated Ringer's and started on cefepime and admitted under hospitalist service. ?  ?Assessment & Plan: ?  ?Principal Problem: ?  Complicated UTI (urinary tract infection) ?Active Problems: ?  Sepsis (HCC) ?  Pressure ulcer ?  Quadriplegia (HCC) ?  Neurogenic bladder ?  History of pulmonary embolus (PE) ?  History of CVA (cerebrovascular accident) ? ?Sepsis either secondary to UTI or infected decubitus sacral ulcer: Meets criteria based on fever,  tachypnea and leukocytosis.  Lactic acid 1.4.  Per wound care, his wound is draining he has narrow opening and he is draining large amount of thick pus and this needs to be widened by general surgery.  General surgery has been consulted.  Culture is negative so far.  He is afebrile since yesterday.  Continue cefepime.  No evidence of MRSA.  We will check MRSA nares.  Urine culture is now growing Enterococcus faecalis.  Blood culture negative. ? ?History of PE and paroxysmal atrial fibrillation: Rates controlled.  Continue Xarelto.  He is not on any other rate control medications. ?  ?Quadriplegic secondary to history of spinal cord and cerebral strokes: ?Patient suffered multiple embolic strokes in 12/2020 which resulted in patient being quadriplegic with neurogenic bladder. ?  ?Autonomic dysreflexia ?  ?DVT prophylaxis:   Xarelto ?  Code Status: DNR  ?Family Communication:  None present at bedside.  Plan of care discussed with patient in length and he/she verbalized understanding and agreed with it. ? ?Status is: Inpatient ?Remains inpatient appropriate because: Needs IV antibiotics and further intervention by general surgery for sacral decubitus ulcer. ? ? ?Estimated body mass index is 19.77 kg/m? as calculated from the following: ?  Height as of this encounter: 5\' 8"  (1.727 m). ?  Weight as of this encounter: 59 kg. ? ?Pressure Injury 05/05/21 Sacrum Mid Stage 2 -  Partial thickness loss of dermis presenting as a shallow open injury with a red, pink wound bed without slough. (Active)  ?05/05/21 2230  ?Location: Sacrum  ?Location Orientation: Mid  ?Staging: Stage  2 -  Partial thickness loss of dermis presenting as a shallow open injury with a red, pink wound bed without slough.  ?Wound Description (Comments):   ?Present on Admission: Yes  ?   ?Pressure Injury 09/09/21 Sacrum Stage 4 - Full thickness tissue loss with exposed bone, tendon or muscle. (Active)  ?09/09/21 2200  ?Location: Sacrum  ?Location Orientation:    ?Staging: Stage 4 - Full thickness tissue loss with exposed bone, tendon or muscle.  ?Wound Description (Comments):   ?Present on Admission: Yes  ?Dressing Type Foam - Lift dressing to assess site every shift 09/10/21 0438  ?   ?Pressure Injury 09/09/21 Leg Right;Lower;Posterior  red scabbed (Active)  ?09/09/21 2200  ?Location: Leg  ?Location Orientation: Right;Lower;Posterior  ?Staging: -- (healing pressure wound red scabbed over)  ?Wound Description (Comments): red scabbed  ?Present on Admission: Yes  ?Dressing Type None 09/10/21 0438  ? ?Nutritional Assessment: ?Body mass index is 19.77 kg/m?Marland KitchenMarland Kitchen ?Seen by dietician.  I agree with the assessment and plan as outlined below: ?Nutrition Status: ?  ?  ?  ? ?. ?Skin Assessment: ?I have examined the patient's skin and I agree with the wound assessment as performed by the wound care RN as outlined below: ?Pressure Injury 05/05/21 Sacrum Mid Stage 2 -  Partial thickness loss of dermis presenting as a shallow open injury with a red, pink wound bed without slough. (Active)  ?05/05/21 2230  ?Location: Sacrum  ?Location Orientation: Mid  ?Staging: Stage 2 -  Partial thickness loss of dermis presenting as a shallow open injury with a red, pink wound bed without slough.  ?Wound Description (Comments):   ?Present on Admission: Yes  ?   ?Pressure Injury 09/09/21 Sacrum Stage 4 - Full thickness tissue loss with exposed bone, tendon or muscle. (Active)  ?09/09/21 2200  ?Location: Sacrum  ?Location Orientation:   ?Staging: Stage 4 - Full thickness tissue loss with exposed bone, tendon or muscle.  ?Wound Description (Comments):   ?Present on Admission: Yes  ?Dressing Type Foam - Lift dressing to assess site every shift 09/10/21 0438  ?   ?Pressure Injury 09/09/21 Leg Right;Lower;Posterior  red scabbed (Active)  ?09/09/21 2200  ?Location: Leg  ?Location Orientation: Right;Lower;Posterior  ?Staging: -- (healing pressure wound red scabbed over)  ?Wound Description (Comments): red scabbed   ?Present on Admission: Yes  ?Dressing Type None 09/10/21 0438  ? ? ?Consultants:  ?General surgery ? ?Procedures:  ?None ? ?Antimicrobials:  ?Anti-infectives (From admission, onward)  ? ? Start     Dose/Rate Route Frequency Ordered Stop  ? 09/09/21 2230  ceFEPIme (MAXIPIME) 2 g in sodium chloride 0.9 % 100 mL IVPB       ? 2 g ?200 mL/hr over 30 Minutes Intravenous Every 8 hours 09/09/21 1643    ? 09/09/21 1430  ceFEPIme (MAXIPIME) 2 g in sodium chloride 0.9 % 100 mL IVPB       ? 2 g ?200 mL/hr over 30 Minutes Intravenous  Once 09/09/21 1428 09/09/21 1527  ? ?  ?  ? ? ?Subjective: ?Patient seen and examined.  He is fully alert and oriented.  Has no complaints.  Feels better than yesterday. ? ?Objective: ?Vitals:  ? 09/09/21 2211 09/10/21 0223 09/10/21 0539 09/10/21 0940  ?BP: (!) 131/100 120/64 124/69 118/68  ?Pulse: 75 71 68 71  ?Resp: 18 18 18 18   ?Temp: 98.4 ?F (36.9 ?C) 98.6 ?F (37 ?C) 99.6 ?F (37.6 ?C) 98.8 ?F (37.1 ?C)  ?TempSrc: Oral  Oral Oral  ?  SpO2: 98% 98% 97% 98%  ?Weight:      ?Height:      ? ? ?Intake/Output Summary (Last 24 hours) at 09/10/2021 1054 ?Last data filed at 09/10/2021 0900 ?Gross per 24 hour  ?Intake 1804.22 ml  ?Output 2000 ml  ?Net -195.78 ml  ? ?Filed Weights  ? 09/09/21 1411  ?Weight: 59 kg  ? ? ?Examination: ? ?General exam: Appears calm and comfortable  ?Respiratory system: Clear to auscultation. Respiratory effort normal. ?Cardiovascular system: S1 & S2 heard, RRR. No JVD, murmurs, rubs, gallops or clicks. No pedal edema. ?Gastrointestinal system: Abdomen is nondistended, soft and nontender. No organomegaly or masses felt. Normal bowel sounds heard. ?Central nervous system: Alert and oriented.  Known quadriplegic. ?Extremities: Has contractures in bilateral upper extremities. ?Skin: As pictured below. ?Psychiatry: Judgement and insight appear normal. Mood & affect appropriate.  ? ? ? ?Data Reviewed: I have personally reviewed following labs and imaging studies ? ?CBC: ?Recent Labs  ?Lab  09/09/21 ?1413 09/10/21 ?0450  ?WBC 13.7* 15.2*  ?NEUTROABS 12.2*  --   ?HGB 12.1* 10.9*  ?HCT 36.5* 32.8*  ?MCV 94.6 94.3  ?PLT 178 159  ? ?Basic Metabolic Panel: ?Recent Labs  ?Lab 09/09/21 ?1413

## 2021-09-10 NOTE — Progress Notes (Signed)
Educated pt' and wife on the new dressing and wound care orders, wife wrote all instructions down and verbalizes she understood everything.  ?

## 2021-09-11 DIAGNOSIS — N390 Urinary tract infection, site not specified: Secondary | ICD-10-CM | POA: Diagnosis not present

## 2021-09-11 LAB — BASIC METABOLIC PANEL
Anion gap: 6 (ref 5–15)
BUN: 10 mg/dL (ref 8–23)
CO2: 20 mmol/L — ABNORMAL LOW (ref 22–32)
Calcium: 9.1 mg/dL (ref 8.9–10.3)
Chloride: 113 mmol/L — ABNORMAL HIGH (ref 98–111)
Creatinine, Ser: 0.57 mg/dL — ABNORMAL LOW (ref 0.61–1.24)
GFR, Estimated: >60 mL/min (ref >60)
Glucose, Bld: 91 mg/dL (ref 70–99)
Potassium: 3.6 mmol/L (ref 3.5–5.1)
Sodium: 139 mmol/L (ref 135–145)

## 2021-09-11 LAB — CBC WITH DIFFERENTIAL/PLATELET
Abs Immature Granulocytes: 0.23 10*3/uL — ABNORMAL HIGH (ref 0.00–0.07)
Basophils Absolute: 0 10*3/uL (ref 0.0–0.1)
Basophils Relative: 0 %
Eosinophils Absolute: 0.1 10*3/uL (ref 0.0–0.5)
Eosinophils Relative: 1 %
HCT: 31.2 % — ABNORMAL LOW (ref 39.0–52.0)
Hemoglobin: 10.9 g/dL — ABNORMAL LOW (ref 13.0–17.0)
Immature Granulocytes: 2 %
Lymphocytes Relative: 4 %
Lymphs Abs: 0.7 10*3/uL (ref 0.7–4.0)
MCH: 32.5 pg (ref 26.0–34.0)
MCHC: 34.9 g/dL (ref 30.0–36.0)
MCV: 93.1 fL (ref 80.0–100.0)
Monocytes Absolute: 0.9 10*3/uL (ref 0.1–1.0)
Monocytes Relative: 5 %
Neutro Abs: 13.9 10*3/uL — ABNORMAL HIGH (ref 1.7–7.7)
Neutrophils Relative %: 88 %
Platelets: 179 10*3/uL (ref 150–400)
RBC: 3.35 MIL/uL — ABNORMAL LOW (ref 4.22–5.81)
RDW: 14.5 % (ref 11.5–15.5)
WBC: 15.8 10*3/uL — ABNORMAL HIGH (ref 4.0–10.5)
nRBC: 0 % (ref 0.0–0.2)

## 2021-09-11 LAB — URINE CULTURE: Culture: >=100000 — AB

## 2021-09-11 MED ORDER — SODIUM CHLORIDE 0.9 % IV SOLN
3.0000 g | Freq: Four times a day (QID) | INTRAVENOUS | Status: DC
  Administered 2021-09-11 – 2021-09-12 (×5): 3 g via INTRAVENOUS
  Filled 2021-09-11 (×8): qty 8

## 2021-09-11 MED ORDER — BISACODYL 5 MG PO TBEC
5.0000 mg | DELAYED_RELEASE_TABLET | Freq: Every day | ORAL | Status: DC | PRN
  Administered 2021-09-11: 5 mg via ORAL
  Filled 2021-09-11: qty 1

## 2021-09-11 NOTE — Progress Notes (Signed)
?PROGRESS NOTE ? ? ? ?[name]  [id number] DOB: 10/23/1945 DOA: 09/09/2021 ?PCP: Pcp, No ? ? ?Brief Narrative:  ?[name] is a 76 y.o. 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 ? ?He was prescribed Augmentin by PCP which the patient has been taking. He had been having issues with his Foley catheter for the last 3 weeks which there was concern for possible blockage or that the catheter size was incorrect when it was last placed on 4/11.  Recently she had noted a dark discharge coming from his Foley catheter.  His wife also reports that he has a pressure ulcer of the sacrum that she has been cleaning and placing Betadine on regularly.  Usually it drained bright red blood, but it had turned dark in color here recently which is wife was concerned that it was possibly infected.  ?  ?In the ED, patient was noted to be febrile up to 101.6 ?F, respirations 20-25, WBC 13.7, hemoglobin 12.1, sodium 134, and lactic acid 1.4.  Urinalysis revealed large hemoglobin, small leukocytes, positive nitrates, few bacteria, greater than 50 RBCs/hpf.  Patient's Foley catheter was changed out in the ED.  Blood and urine cultures had been obtained. Patient had been given 1 L of lactated Ringer's and started on cefepime and admitted under hospitalist service. ?  ?Assessment & Plan: ?  ?Principal Problem: ?  Complicated UTI (urinary tract infection) ?Active Problems: ?  Sepsis ([place]) ?  Pressure ulcer ?  Quadriplegia ([place]) ?  Neurogenic bladder ?  History of pulmonary embolus (PE) ?  History of CVA (cerebrovascular accident) ? ?Sepsis either secondary to UTI : Meets criteria based on fever, tachypnea and leukocytosis.   Lactic acid 1.4.  Urine is growing enterococci.  We will continue Unasyn.  We will touch base with ID for antibiotic recommendations. ? ?Stage IV decubitus sacral ulcer: Seen by general surgery yesterday and underwent debridement.  Per general surgery, wound did not look infected.  They are not planning to do another debridement, they recommended routine wound care. ? ?History of PE and paroxysmal atrial fibrillation: Rates controlled.  Continue Xarelto.  He is not on any other rate control medications. ?  ?Quadriplegic secondary to history of spinal cord and cerebral strokes: ?Patient suffered multiple embolic strokes in [id number] which resulted in patient being quadriplegic with neurogenic bladder. ?  ?Autonomic dysreflexia ?  ?DVT prophylaxis:   Xarelto ?  Code Status: DNR  ?Family Communication:  None present at bedside.  Plan of care discussed with patient in length and he/she verbalized understanding and agreed with it.  Also informed and updated his wife per his request. ? ?Status is: Inpatient ?Remains inpatient appropriate because: Has worsening white cells today, need to continue IV antibiotics today.  Potential discharge tomorrow. ? ? ?Estimated body mass index is 19.77 kg/m? as calculated from the following: ?  Height as of this encounter: 5\' 8"  (1.727 m). ?  Weight as of this encounter: 59 kg. ? ?Pressure Injury 05/05/21 Sacrum Mid Stage 2 -  Partial thickness loss of dermis presenting as a shallow open injury with a red, pink wound bed without slough. (Active)  ?05/05/21 2230  ?Location: Sacrum  ?Location Orientation: Mid  ?Staging: Stage 2 -  Partial thickness loss of dermis presenting  as a shallow open injury with a red, pink wound bed without slough.  ?Wound Description (Comments):   ?Present on Admission: Yes  ?   ?Pressure Injury 09/09/21 Sacrum Stage 4 - Full thickness tissue loss with exposed bone, tendon or muscle. (Active)  ?09/09/21 2200  ?Location: Sacrum  ?Location Orientation:   ?Staging:  Stage 4 - Full thickness tissue loss with exposed bone, tendon or muscle.  ?Wound Description (Comments):   ?Present on Admission: Yes  ?Dressing Type Foam - Lift dressing to assess site every shift 09/11/21 0800  ?   ?Pressure Injury 09/09/21 Leg Right;Lower;Posterior  red scabbed (Active)  ?09/09/21 2200  ?Location: Leg  ?Location Orientation: Right;Lower;Posterior  ?Staging: -- (healing pressure wound red scabbed over)  ?Wound Description (Comments): red scabbed  ?Present on Admission: Yes  ?Dressing Type None 09/11/21 0800  ? ?Nutritional Assessment: ?Body mass index is 19.77 kg/m?Marland KitchenMarland Kitchen ?Seen by dietician.  I agree with the assessment and plan as outlined below: ?Nutrition Status: ?  ?  ?  ? ?. ?Skin Assessment: ?I have examined the patient's skin and I agree with the wound assessment as performed by the wound care RN as outlined below: ?Pressure Injury 05/05/21 Sacrum Mid Stage 2 -  Partial thickness loss of dermis presenting as a shallow open injury with a red, pink wound bed without slough. (Active)  ?05/05/21 2230  ?Location: Sacrum  ?Location Orientation: Mid  ?Staging: Stage 2 -  Partial thickness loss of dermis presenting as a shallow open injury with a red, pink wound bed without slough.  ?Wound Description (Comments):   ?Present on Admission: Yes  ?   ?Pressure Injury 09/09/21 Sacrum Stage 4 - Full thickness tissue loss with exposed bone, tendon or muscle. (Active)  ?09/09/21 2200  ?Location: Sacrum  ?Location Orientation:   ?Staging: Stage 4 - Full thickness tissue loss with exposed bone, tendon or muscle.  ?Wound Description (Comments):   ?Present on Admission: Yes  ?Dressing Type Foam - Lift dressing to assess site every shift 09/11/21 0800  ?   ?Pressure Injury 09/09/21 Leg Right;Lower;Posterior  red scabbed (Active)  ?09/09/21 2200  ?Location: Leg  ?Location Orientation: Right;Lower;Posterior  ?Staging: -- (healing pressure wound red scabbed over)  ?Wound Description (Comments): red scabbed  ?Present  on Admission: Yes  ?Dressing Type None 09/11/21 0800  ? ? ?Consultants:  ?General surgery ? ?Procedures:  ?Wound debridement of the sacral wound on 09/10/2021. ? ?Antimicrobials:  ?Anti-infectives (From admission, onward)  ? ? Start     Dose/Rate Route Frequency Ordered Stop  ? 09/11/21 1000  Ampicillin-Sulbactam (UNASYN) 3 g in sodium chloride 0.9 % 100 mL IVPB       ? 3 g ?200 mL/hr over 30 Minutes Intravenous Every 6 hours 09/11/21 0756    ? 09/09/21 2230  ceFEPIme (MAXIPIME) 2 g in sodium chloride 0.9 % 100 mL IVPB  Status:  Discontinued       ? 2 g ?200 mL/hr over 30 Minutes Intravenous Every 8 hours 09/09/21 1643 09/11/21 0905  ? 09/09/21 1430  ceFEPIme (MAXIPIME) 2 g in sodium chloride 0.9 % 100 mL IVPB       ? 2 g ?200 mL/hr over 30 Minutes Intravenous  Once 09/09/21 1428 09/09/21 1527  ? ?  ?  ? ? ?Subjective: ?Seen and examined.  He has no complaints.  He is requesting to call his wife for update. ? ?Objective: ?Vitals:  ? 09/10/21 1643 09/10/21 2035 09/11/21 [id number] 09/11/21 0947  ?BP: 122/67 125/73 Marland Kitchen)  113/57 135/65  ?Pulse: 66 67 (!) 58 73  ?Resp: 18 20 15 16   ?Temp: 99 ?F (37.2 ?C) 99.1 ?F (37.3 ?C) 98.5 ?F (36.9 ?C) 97.7 ?F (36.5 ?C)  ?TempSrc: Oral Oral    ?SpO2: 98% 97% 98% 99%  ?Weight:      ?Height:      ? ? ?Intake/Output Summary (Last 24 hours) at 09/11/2021 1140 ?Last data filed at 09/11/2021 0800 ?Gross per 24 hour  ?Intake 880.07 ml  ?Output 1650 ml  ?Net -769.93 ml  ? ? ?Filed Weights  ? 09/09/21 1411  ?Weight: 59 kg  ? ? ?Examination: ? ?General exam: Appears calm and comfortable  ?Respiratory system: Clear to auscultation. Respiratory effort normal. ?Cardiovascular system: S1 & S2 heard, RRR. No JVD, murmurs, rubs, gallops or clicks. No pedal edema. ?Gastrointestinal system: Abdomen is nondistended, soft and nontender. No organomegaly or masses felt. Normal bowel sounds heard. ?Central nervous system: Alert and oriented.  Known quadriplegic ?Extremities: Symmetric 5 x 5 power. ?Skin: Stage IV  sacral wound as below. ?Psychiatry: Judgement and insight appear normal. Mood & affect appropriate.  ? ? ? ? ?Data Reviewed: I have personally reviewed following labs and imaging studies ? ?CBC: ?Recent Labs  ?L

## 2021-09-11 NOTE — Progress Notes (Signed)
Pharmacy Antibiotic Note ? ?[name] is a 76 y.o. male admitted on 09/09/2021 with UTI and sacral wound infection .  Pharmacy has been consulted for transition from cefepime to Unasyn dosing. ? ?Plan: ?Discontinue cefepime ?Start Unasyn 3 grams iv q6h ?Follow micro results, and follow up on length of therapy, narrowing of therapy as more data is available ? ?Height: 5\' 8"  (172.7 cm) ?Weight: 59 kg (130 lb) ?IBW/kg (Calculated) : 68.4 ? ?Temp (24hrs), Avg:98.9 ?F (37.2 ?C), Min:98.5 ?F (36.9 ?C), Max:99.1 ?F (37.3 ?C) ? ?Recent Labs  ?Lab 09/09/21 ?1413 09/09/21 ?2006 09/10/21 ?0450 09/11/21 ?0811  ?WBC 13.7*  --  15.2* 15.8*  ?CREATININE 0.42*  --  0.46* 0.57*  ?LATICACIDVEN 1.4 1.8  --   --   ?  ?Estimated Creatinine Clearance: 68.6 mL/min (A) (by C-G formula based on SCr of 0.57 mg/dL (L)).   ? ?No Known Allergies ? ?Antimicrobials this admission: ?5/6- augmentin >> 5/8 ?5/8- cefepime >> 5/10 ?5/10- unasyn >> ? ?Microbiology results: ?5/8 Bcx- ngtd <24H ?5/8 Ucx- >100K E. Faecalis- susceptibilities to follow ?5/9 MRSA PCR neg ? ?Thank you for allowing pharmacy to be a part of this patient?s care. ? ?Gennaro Lizotte BS, PharmD, BCPS ?Clinical Pharmacist ?09/11/2021 9:08 AM ? ?Contact: [phone] after 3 PM ? ?"Be curious, not judgmental..." -Jamal Maes ? ?

## 2021-09-11 NOTE — TOC Initial Note (Signed)
Transition of Care (TOC) - Initial/Assessment Note  ? ? ?Patient Details  ?Name: [name] ?MRN: [id number] ?Date of Birth: July 14, 1945 ? ?Transition of Care (TOC) CM/SW Contact:    ?Tom-Johnson, Hershal Coria, RN ?Phone Number: ?09/11/2021, 1:57 PM ? ?Clinical Narrative:                 ? ?CM spoke with patient at bedside about needs for post hospital transition. Admitted for Complicated UTI and found to be Sepsis. Has hx of quadriplegia 2/2 to CVA to Spinal Cord, Residual Neurogenic Bladder and Chronic Indwelling Foley, Embolism, Paroxysmal A-Fib and on anticoagulation, Autonomic Dysreflexia, and elevated PSA  ?Wife and son are primary care givers at home. Patient is active with [facility] for Home Health PT/OT/RN disciplines.  Cheryl notified for resumption of care at discharge and for his wound care. Acceptance noted.  ?Has necessary DME's at home.  ?PCP is Pollie Friar, MD and [facility] [place].  ?CM will continue to follow with needs. ? ? ?Expected Discharge Plan: Home w Home Health Services ?Barriers to Discharge: Continued Medical Work up ? ? ?Patient Goals and CMS Choice ?Patient states their goals for this hospitalization and ongoing recovery are:: To return home ?CMS Medicare.gov Compare Post Acute Care list provided to:: Patient ?Choice offered to / list presented to : Patient, Spouse ? ?Expected Discharge Plan and Services ?Expected Discharge Plan: Home w Home Health Services ?  ?Discharge Planning Services: CM Consult ?Post Acute Care Choice: Home Health ?Living arrangements for the past 2 months: Single Family Home ?                ?  ?  ?  ?  ?  ?HH Arranged: PT, OT, RN, Disease Management (Resumption of care.) ?HH Agency: [facility] ?Date HH Agency Contacted: 09/11/21 ?Time HH Agency Contacted: 1350 ?Representative spoke with at [facility] Agency: Elnita Maxwell ? ?Prior Living Arrangements/Services ?Living arrangements for the past 2 months: Single Family Home ?Lives with::  Spouse ?Patient language and need for interpreter reviewed:: Yes ?Do you feel safe going back to the place where you live?: Yes      ?Need for Family Participation in Patient Care: Yes (Comment) ?Care giver support system in place?: Yes (comment) ?  ?Criminal Activity/Legal Involvement Pertinent to Current Situation/Hospitalization: No - Comment as needed ? ?Activities of Daily Living ?Home Assistive Devices/Equipment: Hospital bed, [facility] Lift ?ADL Screening (condition at time of admission) ?Patient's cognitive ability adequate to safely complete daily activities?: No ?Is the patient deaf or have difficulty hearing?: No ?Does the patient have difficulty seeing, even when wearing glasses/contacts?: No ?Does the patient have difficulty concentrating, remembering, or making decisions?: No ?Patient able to express need for assistance with ADLs?: Yes ?Does the patient have difficulty dressing or bathing?: Yes ?Independently performs ADLs?: No ?Communication: Dependent ?Is this a change from baseline?: Pre-admission baseline ?Dressing (OT): Dependent ?Is this a change from baseline?: Pre-admission baseline ?Grooming: Dependent ?Is this a change from baseline?: Pre-admission baseline ?Feeding: Dependent ?Is this a change from baseline?: Pre-admission baseline ?Bathing: Dependent ?Is this a change from baseline?: Pre-admission baseline ?Toileting: Dependent ?Is this a change from baseline?: Pre-admission baseline ?In/Out Bed: Dependent ?Is this a change from baseline?: Pre-admission baseline ?Walks in Home: Dependent ?Is this a change from baseline?: Pre-admission baseline ?Does the patient have difficulty walking or climbing stairs?: Yes ?Weakness of Legs: Both ?Weakness of Arms/Hands: Both ? ?Permission Sought/Granted ?Permission sought to share information with : Case Manager, Chief of Staff  Representative, Family Supports ?Permission granted to share information with : Yes, Verbal Permission Granted ?   ?   ?   ?    ? ?Emotional Assessment ?Appearance:: Appears stated age ?Attitude/Demeanor/Rapport: Engaged, Gracious ?Affect (typically observed): Accepting, Appropriate, Calm, Hopeful ?Orientation: : Oriented to Self, Oriented to Place, Oriented to  Time, Oriented to Situation ?Alcohol / Substance Use: Not Applicable ?Psych Involvement: No (comment) ? ?Admission diagnosis:  Complicated UTI (urinary tract infection) [N39.0] ?Sepsis, due to unspecified organism, unspecified whether acute organ dysfunction present (HCC) [A41.9] ?Patient Active Problem List  ? Diagnosis Date Noted  ? Complicated UTI (urinary tract infection) 09/09/2021  ? Pressure ulcer 09/09/2021  ? Pressure injury of skin 09/09/2021  ? History of pulmonary embolus (PE) 09/09/2021  ? History of CVA (cerebrovascular accident) 09/09/2021  ? Sepsis (HCC) 09/09/2021  ? Atrial fibrillation with RVR (HCC)   ? Pulmonary embolism (HCC) 05/05/2021  ? Labile blood pressure   ? Acute lower UTI   ? Dysphagia 12/14/2020  ? Quadriplegia (HCC) 12/13/2020  ? Neurogenic bowel 12/13/2020  ? Neurogenic bladder 12/13/2020  ? Scrotal swelling 12/13/2020  ? Foley catheter in place on admission 12/13/2020  ? CVA (cerebral vascular accident) (HCC) 12/08/2020  ? Acute CVA (cerebrovascular accident) (HCC) 12/07/2020  ? Abnormal MRI 12/07/2020  ? Sinus bradycardia seen on cardiac monitor 12/07/2020  ? ?PCP:  Pcp, No ?Pharmacy:   ?[id number] [place], [place] - [address] ?[address] ?[place] [address] ?Phone: [phone] Fax: [phone] ? ? ? ? ?Social Determinants of Health (SDOH) Interventions ?  ? ?Readmission Risk Interventions ? ?  05/06/2021  ? 11:43 AM  ?Readmission Risk Prevention Plan  ?Transportation Screening Complete  ?PCP or Specialist Appt within 5-7 Days Complete  ?Home Care Screening Complete  ?Medication Review (RN CM) Complete  ? ? ? ?

## 2021-09-12 ENCOUNTER — Other Ambulatory Visit (HOSPITAL_COMMUNITY): Payer: Self-pay

## 2021-09-12 ENCOUNTER — Encounter: Payer: Medicare Other | Admitting: Cardiology

## 2021-09-12 DIAGNOSIS — L89154 Pressure ulcer of sacral region, stage 4: Secondary | ICD-10-CM

## 2021-09-12 DIAGNOSIS — N390 Urinary tract infection, site not specified: Secondary | ICD-10-CM | POA: Diagnosis not present

## 2021-09-12 LAB — CBC WITH DIFFERENTIAL/PLATELET
Abs Immature Granulocytes: 0.21 10*3/uL — ABNORMAL HIGH (ref 0.00–0.07)
Basophils Absolute: 0 10*3/uL (ref 0.0–0.1)
Basophils Relative: 0 %
Eosinophils Absolute: 0.1 10*3/uL (ref 0.0–0.5)
Eosinophils Relative: 1 %
HCT: 31.8 % — ABNORMAL LOW (ref 39.0–52.0)
Hemoglobin: 11 g/dL — ABNORMAL LOW (ref 13.0–17.0)
Immature Granulocytes: 2 %
Lymphocytes Relative: 6 %
Lymphs Abs: 0.9 10*3/uL (ref 0.7–4.0)
MCH: 31.5 pg (ref 26.0–34.0)
MCHC: 34.6 g/dL (ref 30.0–36.0)
MCV: 91.1 fL (ref 80.0–100.0)
Monocytes Absolute: 0.7 10*3/uL (ref 0.1–1.0)
Monocytes Relative: 5 %
Neutro Abs: 11.7 10*3/uL — ABNORMAL HIGH (ref 1.7–7.7)
Neutrophils Relative %: 86 %
Platelets: 212 10*3/uL (ref 150–400)
RBC: 3.49 MIL/uL — ABNORMAL LOW (ref 4.22–5.81)
RDW: 14.6 % (ref 11.5–15.5)
WBC: 13.7 10*3/uL — ABNORMAL HIGH (ref 4.0–10.5)
nRBC: 0 % (ref 0.0–0.2)

## 2021-09-12 LAB — BASIC METABOLIC PANEL
Anion gap: 6 (ref 5–15)
BUN: 6 mg/dL — ABNORMAL LOW (ref 8–23)
CO2: 23 mmol/L (ref 22–32)
Calcium: 9 mg/dL (ref 8.9–10.3)
Chloride: 110 mmol/L (ref 98–111)
Creatinine, Ser: 0.35 mg/dL — ABNORMAL LOW (ref 0.61–1.24)
GFR, Estimated: >60 mL/min (ref >60)
Glucose, Bld: 94 mg/dL (ref 70–99)
Potassium: 3.3 mmol/L — ABNORMAL LOW (ref 3.5–5.1)
Sodium: 139 mmol/L (ref 135–145)

## 2021-09-12 MED ORDER — POTASSIUM CHLORIDE CRYS ER 20 MEQ PO TBCR
40.0000 meq | EXTENDED_RELEASE_TABLET | Freq: Once | ORAL | Status: AC
  Administered 2021-09-12: 40 meq via ORAL
  Filled 2021-09-12: qty 2

## 2021-09-12 MED ORDER — AMOXICILLIN-POT CLAVULANATE 875-125 MG PO TABS
1.0000 | ORAL_TABLET | Freq: Two times a day (BID) | ORAL | 0 refills | Status: AC
  Filled 2021-09-12: qty 14, 7d supply, fill #0

## 2021-09-12 NOTE — Progress Notes (Signed)
Patient discharge teaching given, including activity, diet, follow-up appoints, and medications. Patient verbalized understanding of all discharge instructions. IV access was d/c'd. Vitals are stable. Skin is intact except as charted in most recent assessments. Pt to be escorted out by [facility], to be driven home by family. DNR paperwork with patient. However, patient is missing Living Will paperwork, may have been sent to Medical Records. Instructions will be left on how to obtain medical records. ?

## 2021-09-12 NOTE — TOC Transition Note (Signed)
Transition of Care (TOC) - CM/SW Discharge Note ? ? ?Patient Details  ?Name: [name] ?MRN: [id number] ?Date of Birth: 1945/12/08 ? ?Transition of Care (TOC) CM/SW Contact:  ?Tom-Johnson, Renea Ee, RN ?Phone Number: ?09/12/2021, 11:48 AM ? ? ?Clinical Narrative:    ? ?Patient is scheduled for discharge today. Active with [facility] for home health PT/OT/RN disciplines. Patient and wife, [name] expressed dissatisfaction with their care and request to change to another agency. CM read over list of agencies from Medicare.gov and [name] chose [facility]. [place] notified and acceptance voiced. Info on AVS. ?[facility] scheduled for transportation at 1 pm. No further TOC needs noted. ? ?Final next level of care: [place] ?Barriers to Discharge: Barriers Resolved ? ? ?Patient Goals and CMS Choice ?Patient states their goals for this hospitalization and ongoing recovery are:: To return home ?CMS Medicare.gov Compare Post Acute Care list provided to:: Patient ?Choice offered to / list presented to : Patient, Spouse ? ?Discharge Placement ?  ?           ?  ?Patient to be transferred to facility by: [facility] ?  ?  ? ?Discharge Plan and Services ?  ?Discharge Planning Services: CM Consult ?Post Acute Care Choice: Home Health          ?  ?  ?  ?  ?  ?HH Arranged: PT, OT, RN, Disease Management (Resumption of care.) ?[place] Agency: [place] ?Date HH Agency Contacted: 09/11/21 ?Time [place]: [id number] spoke with at [place]: Malachy Mood ? ?Social Determinants of Health (SDOH) Interventions ?  ? ? ?Readmission Risk Interventions ? ?  05/06/2021  ? 11:43 AM  ?Readmission Risk Prevention Plan  ?Transportation Screening Complete  ?PCP or Specialist Appt within 5-7 Days Complete  ?Home Care Screening Complete  ?Medication Review (RN CM) Complete  ? ? ? ? ? ?

## 2021-09-12 NOTE — Care Management Important Message (Signed)
Important Message ? ?Patient Details  ?Name: [name] ?MRN: [id number] ?Date of Birth: 1945-06-15 ? ? ?Medicare Important Message Given:  Yes ? ? ? ? ?[name] ?09/12/2021, 3:23 PM ?

## 2021-09-12 NOTE — Progress Notes (Signed)
DISCHARGE NOTE  ?Lorraine Lax to be discharged Home per MD order. Patient verbalized understanding. ? ? PIV catheter discontinued intact. Site without signs and symptoms of complications. Dressing and pressure applied. Pt denies pain at the site currently. No complaints noted. Discharged with foley cathether. All belongings with patient. ? ?Discharge packet assembled. An After Visit Summary (AVS) was printed and given to the EMS personnel. Patient escorted via stretcher and discharged to home via ambulance. dayshift RN called and instructions given to wife; all questions and concerns addressed.  ? ?Milus Banister, RN ?

## 2021-09-12 NOTE — Discharge Instructions (Addendum)
Pack sacrum wound Q day with a narrow strip of Aquacel, using swab to fill, then cover with foam dressing. Change foam dressing Q 3 days or PRN soiling. ?

## 2021-09-12 NOTE — Progress Notes (Signed)
Called wife to let her know patient was picked up by [facility] and they are on their way to home ?

## 2021-09-12 NOTE — Plan of Care (Signed)
?  Problem: Urinary Elimination: ?Goal: Signs and symptoms of infection will decrease ?Outcome: Adequate for Discharge ?  ?Problem: Education: ?Goal: Knowledge of General Education information will improve ?Description: Including pain rating scale, medication(s)/side effects and non-pharmacologic comfort measures ?Outcome: Adequate for Discharge ?  ?Problem: Health Behavior/Discharge Planning: ?Goal: Ability to manage health-related needs will improve ?Outcome: Adequate for Discharge ?  ?Problem: Clinical Measurements: ?Goal: Ability to maintain clinical measurements within normal limits will improve ?Outcome: Adequate for Discharge ?Goal: Will remain free from infection ?Outcome: Adequate for Discharge ?Goal: Diagnostic test results will improve ?Outcome: Adequate for Discharge ?Goal: Respiratory complications will improve ?Outcome: Adequate for Discharge ?Goal: Cardiovascular complication will be avoided ?Outcome: Adequate for Discharge ?  ?Problem: Activity: ?Goal: Risk for activity intolerance will decrease ?Outcome: Adequate for Discharge ?  ?Problem: Nutrition: ?Goal: Adequate nutrition will be maintained ?Outcome: Adequate for Discharge ?  ?Problem: Elimination: ?Goal: Will not experience complications related to bowel motility ?Outcome: Adequate for Discharge ?Goal: Will not experience complications related to urinary retention ?Outcome: Adequate for Discharge ?  ?Problem: Coping: ?Goal: Level of anxiety will decrease ?Outcome: Adequate for Discharge ?  ?Problem: Pain Managment: ?Goal: General experience of comfort will improve ?Outcome: Adequate for Discharge ?  ?Problem: Safety: ?Goal: Ability to remain free from injury will improve ?Outcome: Adequate for Discharge ?  ?Problem: Skin Integrity: ?Goal: Risk for impaired skin integrity will decrease ?Outcome: Adequate for Discharge ?  ?

## 2021-09-12 NOTE — Discharge Summary (Signed)
PatientPhysician Discharge Summary  ?[name] [id number] DOB: 25-Apr-1946 DOA: 09/09/2021 ? ?PCP: Clovia Cuff, MD ? ?Admit date: 09/09/2021 ?Discharge date: 09/12/2021 ?30 Day Unplanned Readmission Risk Score   ? ?Flowsheet Row ED to Hosp-Admission (Current) from 09/09/2021 in [facility]  ?30 Day Unplanned Readmission Risk Score (%) 12.86 Filed at 09/12/2021 0801  ? ?  ? ? This score is the patient's risk of an unplanned readmission within 30 days of being discharged (0 -100%). The score is based on dignosis, age, lab data, medications, orders, and past utilization.   ?Low:  0-14.9   Medium: 15-21.9   High: 22-29.9   Extreme: 30 and above ? ?  ? ?  ? ? ? ?Admitted From: Home ?Disposition: Home ? ?Recommendations for Outpatient Follow-up:  ?Follow up with PCP in 1-2 weeks ?Please obtain BMP/CBC in one week ?Please follow up with your PCP on the following pending results: ?Unresulted Labs (From admission, onward)  ? ?  Start     Ordered  ? 09/12/21 [id number]  Basic metabolic panel  ONCE - URGENT,   URGENT       ? 09/12/21 0809  ? ?  ?  ? ?  ?  ? ? ?Home Health: Yes ?Equipment/Devices: None ? ?Discharge Condition: Stable ?CODE STATUS: DNR ?Diet recommendation: Cardiac ? ?Subjective: Seen and examined.  No complaints other than some concern about scrotal swelling.  He is agreeable to go home.  Per his request, I also called his wife and she is also in agreement. ? ?Brief/Interim Summary: [name] is a 76 y.o. 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 ?  ?He was prescribed Augmentin by PCP which the patient has been taking. He had been having issues with his Foley catheter for the last 3  weeks which there was concern for possible blockage or that the catheter size was incorrect when it was last placed on 4/11.  Recently she had noted a dark discharge coming from his Foley catheter.  His wife also reports that he has a pressure ulcer of the sacrum that she has been cleaning and placing Betadine on regularly.  Usually it drained bright red blood, but it had turned dark in color here recently which is wife was concerned that it was possibly infected.  ?  ?In the ED, patient was noted to be febrile up to 101.6 ?F, respirations 20-25, WBC 13.7, hemoglobin 12.1, sodium 134, and lactic acid 1.4.  Urinalysis revealed large hemoglobin, small leukocytes, positive nitrates, few bacteria, greater than 50 RBCs/hpf.  Patient's Foley catheter was changed out in the ED.  Blood and urine cultures had been obtained. Patient had been given 1 L of lactated Ringer's and started on cefepime and admitted under hospitalist service. ? ?Sepsis either secondary to UTI : Meets criteria based on fever, tachypnea and leukocytosis.  Lactic acid 1.4.  Antibiotics were transitioned to Unasyn.  Urine culture is growing Enterococcus but blood culture negative.  Discussed with ID, they recommended 7 days of oral Augmentin.  Patient has remained afebrile since last 2 days now. ?  ?Stage IV decubitus sacral ulcer: Seen by general surgery and underwent debridement on 09/10/2021.  Per general surgery, wound did not look infected.  They are not planning to do  another debridement, they recommended routine wound care. ?  ?History of PE and paroxysmal atrial fibrillation: Rates controlled.  Continue Xarelto.  He is not on any other rate control medications. ?  ?Quadriplegic secondary to history of spinal cord and cerebral strokes: ?Patient suffered multiple embolic strokes in [id number] which resulted in patient being quadriplegic with neurogenic bladder. ?  ?Autonomic dysreflexia ? ?Discharge plan was discussed with patient and/or family member  and they verbalized understanding and agreed with it.  ?Discharge Diagnoses:  ?Principal Problem: ?  Complicated UTI (urinary tract infection) ?Active Problems: ?  Sepsis ([place]) ?  Quadriplegia ([place]) ?  Neurogenic bladder ?  History of pulmonary embolus ([place]) ?  History of CVA (cerebrovascular accident) ?  Catheter-associated urinary tract infection ([place]) ?  Decubitus ulcer of sacral region, stage 4 ([place]) ? ? ? ?Discharge Instructions ? ? ?Allergies as of 09/12/2021   ?No Known Allergies ?  ? ?  ?Medication List  ?  ? ?STOP taking these medications   ? ?atorvastatin 80 MG tablet ?Commonly known as: LIPITOR ?  ? ?  ? ?TAKE these medications   ? ?acetaminophen 325 MG tablet ?Commonly known as: TYLENOL ?Take 2 tablets (650 mg total) by mouth every 4 (four) hours as needed for mild pain (or temp > 37.5 C (99.5 F)). ?  ?amoxicillin-clavulanate 875-125 MG tablet ?Commonly known as: Augmentin ?Take 1 tablet by mouth 2 (two) times daily for 7 days. ?  ?Baclofen 5 MG Tabs ?Take 5 mg by mouth 3 (three) times daily. ?  ?bisacodyl 10 MG suppository ?Commonly known as: DULCOLAX ?Place 1 suppository (10 mg total) rectally daily at 8 pm. ?What changed:  ?when to take this ?reasons to take this ?  ?ELDERBERRY PO ?Take 1 capsule by mouth daily. ?  ?fludrocortisone 0.1 MG tablet ?Commonly known as: FLORINEF ?Take 1 tablet (0.1 mg total) by mouth daily. For orthostatic hypotension/low BP ?What changed: additional instructions ?  ?lidocaine 5 % ?Commonly known as: LIDODERM ?Place 3 patches onto the skin daily. Remove & Discard patch within 12 hours or as directed by MD ?  ?liver oil-zinc oxide 40 % ointment ?Commonly known as: DESITIN ?Apply 1 application. topically daily. ?  ?pantoprazole 40 MG tablet ?Commonly known as: PROTONIX ?Take 1 tablet (40 mg total) by mouth daily. ?  ?rivaroxaban 20 MG Tabs tablet ?Commonly known as: XARELTO ?Take 1 tablet (20 mg total) by mouth daily with supper. ?  ?[place] #1 OP ?Place 1 drop  into both eyes daily as needed (dry eyes). ?  ?sodium chloride 0.65 % nasal spray ?Commonly known as: OCEAN ?Place 1 spray into the nose daily as needed for congestion. ?  ?TYLENOL PM EXTRA STRENGTH PO ?Take 2 tablets by mouth daily as needed (pain). ?  ?VITAMIN C PO ?Take 1 tablet by mouth daily. ?  ?VITAMIN D3 PO ?Take 1 tablet by mouth daily. ?  ?zinc gluconate 50 MG tablet ?Take 50 mg by mouth daily. ?  ? ?  ? ? Follow-up Information   ? ? Clovia Cuff, MD Follow up in 1 week(s).   ?Specialty: Internal Medicine ?Contact information: ?[address] ?[place] ?[place] [id number] ?[phone] ? ? ?  ?  ? ?  ?  ? ?  ? ?No Known Allergies ? ?Consultations: General surgery ? ? ?Procedures/Studies: ?CT ABDOMEN PELVIS W CONTRAST ? ?Result Date: 09/09/2021 ?CLINICAL DATA:  Sepsis and sacral decubitus ulcer with concern for deep infection. EXAM: CT  ABDOMEN AND PELVIS WITH CONTRAST TECHNIQUE: Multidetector CT imaging of the abdomen and pelvis was performed using the standard protocol following bolus administration of intravenous contrast. RADIATION DOSE REDUCTION: This exam was performed according to the departmental dose-optimization program which includes automated exposure control, adjustment of the mA and/or kV according to patient size and/or use of iterative reconstruction technique. CONTRAST:  154mL OMNIPAQUE IOHEXOL 300 MG/ML  SOLN COMPARISON:  Prior CT of the abdomen and pelvis on 05/05/2021 FINDINGS: Lower chest: No acute abnormality. Hepatobiliary: No focal liver abnormality is seen. No gallstones, gallbladder wall thickening, or biliary dilatation. Pancreas: No evidence of pancreatitis or mass. Stable mild prominence of the pancreatic duct at the level of the pancreatic head Spleen: Normal in size without focal abnormality. Adrenals/Urinary Tract: Normal adrenal glands. Stable posterior upper pole left renal cyst which appears benign and requires no follow-up. No hydronephrosis. Interval  development calculi in the dependent bladder. Stomach/Bowel: Moderate fecal material in the colon including more focal stool ball in the rectum, similar to the prior CT. No evidence of overt bowel obstruction, fre

## 2021-09-14 LAB — CULTURE, BLOOD (ROUTINE X 2)
Culture: NO GROWTH
Culture: NO GROWTH
Special Requests: ADEQUATE
Special Requests: ADEQUATE

## 2021-09-18 LAB — CUP PACEART REMOTE DEVICE CHECK
Date Time Interrogation Session: 20230517231137
Implantable Pulse Generator Implant Date: 20220811

## 2021-09-23 ENCOUNTER — Ambulatory Visit (INDEPENDENT_AMBULATORY_CARE_PROVIDER_SITE_OTHER): Payer: Medicare Other

## 2021-09-23 DIAGNOSIS — I6340 Cerebral infarction due to embolism of unspecified cerebral artery: Secondary | ICD-10-CM

## 2021-10-07 ENCOUNTER — Encounter: Payer: Medicare Other | Attending: Registered Nurse | Admitting: Physical Medicine and Rehabilitation

## 2021-10-09 NOTE — Progress Notes (Signed)
Carelink Summary Report / Loop Recorder 

## 2021-10-28 ENCOUNTER — Ambulatory Visit (INDEPENDENT_AMBULATORY_CARE_PROVIDER_SITE_OTHER): Payer: Medicare Other

## 2021-10-28 DIAGNOSIS — I6340 Cerebral infarction due to embolism of unspecified cerebral artery: Secondary | ICD-10-CM

## 2021-10-28 LAB — CUP PACEART REMOTE DEVICE CHECK
Date Time Interrogation Session: 20230619231052
Implantable Pulse Generator Implant Date: 20220811

## 2021-11-21 NOTE — Progress Notes (Signed)
Carelink Summary Report / Loop Recorder 

## 2021-11-28 LAB — CUP PACEART REMOTE DEVICE CHECK
Date Time Interrogation Session: 20230722231020
Implantable Pulse Generator Implant Date: 20220811

## 2021-12-02 ENCOUNTER — Ambulatory Visit (INDEPENDENT_AMBULATORY_CARE_PROVIDER_SITE_OTHER): Payer: Medicare Other

## 2021-12-02 DIAGNOSIS — I6340 Cerebral infarction due to embolism of unspecified cerebral artery: Secondary | ICD-10-CM

## 2022-01-03 ENCOUNTER — Ambulatory Visit (INDEPENDENT_AMBULATORY_CARE_PROVIDER_SITE_OTHER): Payer: Medicare Other

## 2022-01-03 DIAGNOSIS — I6340 Cerebral infarction due to embolism of unspecified cerebral artery: Secondary | ICD-10-CM | POA: Diagnosis not present

## 2022-01-04 NOTE — Progress Notes (Signed)
Carelink Summary Report / Loop Recorder 

## 2022-01-06 LAB — CUP PACEART REMOTE DEVICE CHECK
Date Time Interrogation Session: 20230830230911
Implantable Pulse Generator Implant Date: 20220811

## 2022-01-10 ENCOUNTER — Other Ambulatory Visit: Payer: Self-pay | Admitting: Physical Medicine and Rehabilitation

## 2022-01-22 NOTE — Progress Notes (Signed)
Carelink Summary Report / Loop Recorder 

## 2022-02-04 LAB — CUP PACEART REMOTE DEVICE CHECK
Date Time Interrogation Session: 20231002231135
Implantable Pulse Generator Implant Date: 20220811

## 2022-02-05 ENCOUNTER — Ambulatory Visit (INDEPENDENT_AMBULATORY_CARE_PROVIDER_SITE_OTHER): Payer: Medicare Other

## 2022-02-05 DIAGNOSIS — I6340 Cerebral infarction due to embolism of unspecified cerebral artery: Secondary | ICD-10-CM

## 2022-02-10 NOTE — Progress Notes (Signed)
Carelink Summary Report / Loop Recorder 

## 2022-03-10 ENCOUNTER — Ambulatory Visit (INDEPENDENT_AMBULATORY_CARE_PROVIDER_SITE_OTHER): Payer: Medicare Other

## 2022-03-10 DIAGNOSIS — I6340 Cerebral infarction due to embolism of unspecified cerebral artery: Secondary | ICD-10-CM | POA: Diagnosis not present

## 2022-03-10 LAB — CUP PACEART REMOTE DEVICE CHECK
Date Time Interrogation Session: 20231105232405
Implantable Pulse Generator Implant Date: 20220811

## 2022-04-08 NOTE — Progress Notes (Signed)
Carelink Summary Report / Loop Recorder 

## 2022-04-14 ENCOUNTER — Ambulatory Visit (INDEPENDENT_AMBULATORY_CARE_PROVIDER_SITE_OTHER): Payer: Medicare Other

## 2022-04-14 DIAGNOSIS — I6340 Cerebral infarction due to embolism of unspecified cerebral artery: Secondary | ICD-10-CM | POA: Diagnosis not present

## 2022-04-14 LAB — CUP PACEART REMOTE DEVICE CHECK
Date Time Interrogation Session: 20231210232237
Implantable Pulse Generator Implant Date: 20220811

## 2022-04-15 ENCOUNTER — Encounter (HOSPITAL_BASED_OUTPATIENT_CLINIC_OR_DEPARTMENT_OTHER): Payer: Medicare Other | Admitting: General Surgery

## 2022-04-16 ENCOUNTER — Encounter (HOSPITAL_BASED_OUTPATIENT_CLINIC_OR_DEPARTMENT_OTHER): Payer: Medicare Other | Attending: General Surgery | Admitting: Internal Medicine

## 2022-04-16 DIAGNOSIS — Z7901 Long term (current) use of anticoagulants: Secondary | ICD-10-CM | POA: Insufficient documentation

## 2022-04-16 DIAGNOSIS — Z8673 Personal history of transient ischemic attack (TIA), and cerebral infarction without residual deficits: Secondary | ICD-10-CM | POA: Diagnosis not present

## 2022-04-16 DIAGNOSIS — L97918 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 with other specified severity: Secondary | ICD-10-CM | POA: Insufficient documentation

## 2022-04-16 DIAGNOSIS — I10 Essential (primary) hypertension: Secondary | ICD-10-CM | POA: Insufficient documentation

## 2022-04-16 DIAGNOSIS — Z86711 Personal history of pulmonary embolism: Secondary | ICD-10-CM | POA: Insufficient documentation

## 2022-04-16 DIAGNOSIS — G8250 Quadriplegia, unspecified: Secondary | ICD-10-CM | POA: Insufficient documentation

## 2022-04-16 DIAGNOSIS — L89154 Pressure ulcer of sacral region, stage 4: Secondary | ICD-10-CM | POA: Diagnosis present

## 2022-04-17 NOTE — Progress Notes (Signed)
KAHNE, HELFAND ([id number]) [id number] Page 1 of 8 Visit Report for 04/16/2022 Chief Complaint Document Details Patient Name: Date of Service: [name], [name] 04/16/2022 9:00 A M Medical Record Number: [id number] Patient Account Number: [id number] Date of Birth/Sex: Treating RN: [birth year] (76 y.o. M) Primary Care Provider: Annita Brod Other Clinician: Referring Provider: Treating Provider/Extender: Manuella Ghazi in Treatment: 0 Information Obtained from: Patient Chief Complaint 04/16/2022; patient is here for a second opinion accompanied by his wife with regards to a sacral pressure ulcer and an area on his right posterior lateral lower leg Electronic Signature(s) Signed: 04/16/2022 5:47:10 PM By: Baltazar Najjar MD Entered By: Baltazar Najjar on 04/16/2022 12:44:48 -------------------------------------------------------------------------------- HPI Details Patient Name: Date of Service: [name] 04/16/2022 9:00 A M Medical Record Number: [id number] Patient Account Number: [id number] Date of Birth/Sex: Treating RN: [birth year] (76 y.o. M) Primary Care Provider: Annita Brod Other Clinician: Referring Provider: Treating Provider/Extender: Manuella Ghazi in Treatment: 0 History of Present Illness HPI Description: ADMISSION 04/16/2022 This is a 76 year old man who arrived accompanied by his wife. 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 During this admission he had pulmonary emboli, atrial fibrillation and he is on chronic anticoagulation. He is cared for at home by home care as well as his wife. He has a suprapubic catheter. His wife states that his wound on his sacrum has been there for about a year. They have a doctor via an in-home care service. He ordered a wound VAC on this sometime in the  mid summer in July but they have not had any improvement per his wife although I do not have any documentation on this. Apparently the home health nurses do not think this is improved either. More recently he has developed an area on his right posterior lower leg. This is likely a pressure area as well. They have been using Santyl He apparently was on antibiotics up until about a week ago but is finished these. His home health agency is [facility]. He had a group 2 mattress but he did not like this so he simply has a gel overlay now. Past medical history includes the after mentioned stroke syndromes, chronic suprapubic catheter, atrial fibrillation, history of PE, autonomic dysreflexia. He is on chronic anticoagulation His ABI on the right was 1.1 Electronic Signature(s) Signed: 04/16/2022 5:47:10 PM By: Baltazar Najjar MD Entered By: Baltazar Najjar on 04/16/2022 12:48:43 Lorraine Lax ([id number]) [id number] Page 2 of 8 -------------------------------------------------------------------------------- Physical Exam Details Patient Name: Date of Service: Jesus, Pacheco 04/16/2022 9:00 A M Medical Record Number: [id number] Patient Account Number: [id number] Date of Birth/Sex: Treating RN: 12/24/1945 (76 y.o. M) Primary Care Provider: Annita Brod Other Clinician: Referring Provider: Treating Provider/Extender: Manuella Ghazi in Treatment: 0 Constitutional Patient is hypertensive.. Pulse regular and within target range for patient.Marland Kitchen Respirations regular, non-labored and within target range.. Temperature is normal and within the target range for the patient.. No change in height.. Cardiovascular Pedal pulses are palpable. Neurological Patient looks somewhat parkinsonian in terms of his face. He also has a tremor in his right arm that looks parkinsonian although his wife states that this came on during the stroke state he is not on a  neuroleptic. 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 This is fairly deep and undermined superiorly with tunneling. There is palpable  bone and including I think most of his coccyx as well as extending into the ischial tuberosity. The wound looks fairly clean there is no debridement done I do not think any debridement is particularly necessary there is no obvious surrounding soft tissue infection 2. On the right posterior lateral leg a oval-shaped wound there is some exposed tendon here. The surface of this wound looks reasonably healthy though again no debridement is necessary Electronic Signature(s) Signed: 04/16/2022 5:47:10 PM By: Baltazar Najjar MD Entered By: Baltazar Najjar on 04/16/2022 12:50:50 -------------------------------------------------------------------------------- Physician Orders Details Patient Name: Date of Service: [name] 04/16/2022 9:00 A M Medical Record Number: [id number] Patient Account Number: [id number] Date of Birth/Sex: Treating RN: [birth year] (76 y.o. Tammy Sours Primary Care Provider: Annita Brod Other Clinician: Referring Provider: Treating Provider/Extender: Manuella Ghazi in Treatment: 0 Verbal / Phone Orders: No Diagnosis Coding ICD-10 Coding Code Description L89.154 Pressure ulcer of sacral region, stage 4 Follow-up Appointments ppointment in 2 weeks. - Dr. Lady Gary Room 1 ***stretcher**** need extra time. Return A Other: - Patient and wife to decide for sacral wound bone biopsy, diagnostic imaging. Wife to pick up Dakin's solution off [employer] or [employer]. Home Health to provide all other supplies. Bathing/ Shower/ Hygiene May shower with protection but do not get wound dressing(s) wet. Negative Presssure Wound Therapy Other: - HOLD WOUND VAC FOR NOW. RAKEEM, COLLEY ([id number]) [id number] Page 3 of 8 Off-Loading A fluidized (Group 3) mattress - will  order through medical modalities for a group 3. ir Turn and reposition every 2 hours Other: - minimal time up in the chair. Only for meals and back to bed. Additional Orders / Instructions Follow Nutritious Diet - increase protein to aid in wound healing. Home Health New wound care orders this week; continue Home Health for wound care. May utilize formulary equivalent dressing for wound treatment orders unless otherwise specified. - see wound care orders below. Other Home Health Orders/Instructions: - [facility] Wound Treatment Wound #1 - Sacrum Cleanser: Wound Cleanser (Home Health) 1 x Per Day/30 Days Discharge Instructions: Cleanse the wound with wound cleanser prior to applying a clean dressing using gauze sponges, not tissue or cotton balls. Peri-Wound Care: Skin Prep (Home Health) 1 x Per Day/30 Days Discharge Instructions: Use skin prep as directed Prim Dressing: Dakin's Solution 0.25%, 16 (oz) (Home Health) 1 x Per Day/30 Days ary Discharge Instructions: Moisten gauze with Dakin's solution Secondary Dressing: Zetuvit Plus Silicone Border Sacrum Dressing, Sm, 7x7 (in/in) (Home Health) 1 x Per Day/30 Days Discharge Instructions: Apply silicone border OR ABD PAD. Wound #2 - Lower Leg Wound Laterality: Right, Posterior Cleanser: Wound Cleanser (Home Health) 3 x Per Week/30 Days Discharge Instructions: Cleanse the wound with wound cleanser prior to applying a clean dressing using gauze sponges, not tissue or cotton balls. Peri-Wound Care: Sween Lotion (Moisturizing lotion) (Home Health) 3 x Per Week/30 Days Discharge Instructions: Apply moisturizing lotion as directed Prim Dressing: Promogran Prisma Matrix, 4.34 (sq in) (silver collagen) (Home Health) 3 x Per Week/30 Days ary Discharge Instructions: Moisten collagen with saline or hydrogel Secondary Dressing: ABD Pad, 5x9 (Home Health) 3 x Per Week/30 Days Discharge Instructions: Apply over primary dressing as  directed. Compression Wrap: Kerlix Roll 4.5x3.1 (in/yd) 3 x Per Week/30 Days Discharge Instructions: Apply Kerlix and Coban compression as directed. Compression Wrap: Coban Self-Adherent Wrap 4x5 (in/yd) 3 x Per Week/30 Days Discharge Instructions: Apply over Kerlix as directed. Electronic Signature(s) Signed: 04/16/2022 5:46:55 PM By:  Shawn Stall RN, BSN Signed: 04/16/2022 5:47:10 PM By: Baltazar Najjar MD Entered By: Shawn Stall on 04/16/2022 10:21:16 -------------------------------------------------------------------------------- Problem List Details Patient Name: Date of Service: [name] 04/16/2022 9:00 A M Medical Record Number: [id number] Patient Account Number: [id number] Date of Birth/Sex: Treating RN: 07/20/1945 (76 y.o. 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 M) Primary Care Provider: Annita Brod Other Clinician: Referring Provider: Treating Provider/Extender: Manuella Ghazi in Treatment: 0 Subjective Chief Complaint Information obtained from Patient 04/16/2022; patient is here for a second opinion accompanied by his wife with regards to a sacral pressure ulcer and an area on his right posterior lateral lower leg History of Present Illness (HPI) ADMISSION 04/16/2022 This is a 76 year old man who arrived accompanied by his wife. 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 During this admission he had pulmonary emboli, atrial fibrillation and he is on chronic anticoagulation. He is cared for at home by home care as well as his wife. He has a suprapubic catheter. His wife states that his wound on his sacrum has been there for about a year. They have a doctor via an in-home care service. He ordered a wound VAC on this sometime in the mid summer in July but they have not had any improvement per his wife although I do not have any documentation on this. Apparently the home health nurses do not think this is improved either. More recently he has developed an area on his right posterior lower leg. This is likely a pressure area as well. They have been using Santyl He apparently was on antibiotics up until about a week ago but is finished these. His home health agency is [facility]. He had a group 2 mattress but he did not like this so he simply has a gel overlay now. Past medical history includes the after mentioned stroke syndromes, chronic suprapubic catheter, atrial fibrillation, history of PE, autonomic dysreflexia. He is on chronic anticoagulation His ABI on the right was 1.1 Patient History Allergies No Known Drug Allergies Family History Cancer - Mother,Father. Social History Never smoker, Marital Status - Married, Alcohol Use - Never, Drug Use - No History. Medical History Cardiovascular KAYSEN, DEAL ([id number])  [id number] Page 5 of 8 Patient has history of Arrhythmia - A.fib Neurologic Patient has history of Quadriplegia - CVA 2022 Hospitalization/Surgery History - sepsis 09/09/2021 pressure ulcer started. - 12/03/2020 CVA. Medical A Surgical History Notes nd Constitutional Symptoms (General Health) CVA 12/03/2020 Respiratory PE Genitourinary neurogenic bladder-foley Review of Systems (ROS) Integumentary (Skin) Complains or has symptoms of Wounds - sacral wound. Objective Constitutional Patient is hypertensive.. Pulse regular and within target range for patient.Marland Kitchen Respirations regular,  non-labored and within target range.. Temperature is normal and within the target range for the patient.. No change in height.. Vitals Time Taken: 9:25 AM, Height: 68 in, Source: Stated, Weight: 150 lbs, Source: Stated, BMI: 22.8, Temperature: 98.8 F, Pulse: 78 bpm, Respiratory Rate: 18 breaths/min, Blood Pressure: 173/86 mmHg. Cardiovascular Pedal pulses are palpable. Neurological Patient looks somewhat parkinsonian in terms of his face. He also has a tremor in his right arm that looks parkinsonian although his wife states that this came on during the stroke state he is not on a neuroleptic. 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 This is fairly deep and undermined superiorly with tunneling. There is palpable bone and including I think most of his coccyx as well as extending into the ischial tuberosity. The wound looks fairly clean there is no debridement done I do not think any debridement is particularly necessary there is no obvious surrounding soft tissue infection 2. On the right posterior lateral leg a oval-shaped wound there is some exposed tendon here. The surface of this wound looks reasonably healthy though again no debridement is necessary Integumentary (Hair, Skin) Wound #1 status is Open. Original cause of wound was Pressure Injury.  The date acquired was: 09/02/2021. The wound is located on the Sacrum. The wound measures 3.3cm length x 2.6cm width x 2.7cm depth; 6.739cm^2 area and 18.195cm^3 volume. There is bone and Fat Layer (Subcutaneous Tissue) exposed. Tunneling has been noted at 12:00 with a maximum distance of 4cm. Undermining begins at 7:00 and ends at 5:00 with a maximum distance of 3.4cm. There is a medium amount of serosanguineous drainage noted. The wound margin is well defined and not attached to the wound base. There is large (67-100%) red, pink granulation within the wound bed. There is a small (1-33%) amount of necrotic tissue within the wound bed including Adherent Slough. The periwound skin appearance did not exhibit: Callus, Crepitus, Excoriation, Induration, Rash, Scarring, Dry/Scaly, Maceration, Atrophie Blanche, Cyanosis, Ecchymosis, Hemosiderin Staining, Mottled, Pallor, Rubor, Erythema. Wound #2 status is Open. Original cause of wound was Gradually Appeared. The date acquired was: 11/02/2021. The wound is located on the Right,Posterior Lower Leg. The wound measures 6.6cm length x 2cm width x 0.5cm depth; 10.367cm^2 area and 5.184cm^3 volume. There is tendon and Fat Layer (Subcutaneous Tissue) exposed. Tunneling has been noted at 12:00 with a maximum distance of 0.5cm. Undermining begins at 6:00 and ends at 8:00 with a maximum distance of 0.4cm. There is a medium amount of serosanguineous drainage noted. The wound margin is epibole. There is medium (34-66%) red, pink granulation within the wound bed. There is a medium (34-66%) amount of necrotic tissue within the wound bed including Adherent Slough. The periwound skin appearance did not exhibit: Callus, Crepitus, Excoriation, Induration, Rash, Scarring, Dry/Scaly, Maceration, Atrophie Blanche, Cyanosis, Ecchymosis, Hemosiderin Staining, Mottled, Pallor, Rubor, Erythema. 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 - Dr. Lady Gary Room 1 ***stretcher**** need extra time. Other: - Patient and wife to decide for sacral wound bone biopsy, diagnostic imaging. Wife to pick up Dakin's solution off [employer] or [employer]. NECHEMIA, CHIAPPETTA ([id number]) [id number] Page 6 of 8 Home Health to provide all other supplies. Bathing/ Shower/ Hygiene: May shower with protection but do not get wound dressing(s) wet. Negative Presssure Wound Therapy: Other: - HOLD WOUND  VAC FOR NOW. Off-Loading: Air fluidized (Group 3) mattress - will order through medical modalities for a group 3. Turn and reposition every 2 hours Other: - minimal time up in the chair. Only for meals and back to bed. Additional Orders / Instructions: Follow Nutritious Diet - increase protein to aid in wound healing. Home Health: New wound care orders this week; continue Home Health for wound care. May utilize formulary equivalent dressing for wound treatment orders unless otherwise specified. - see wound care orders below. Other Home Health Orders/Instructions: - [facility] WOUND #1: - Sacrum Wound Laterality: Cleanser: Wound Cleanser (Home Health) 1 x Per Day/30 Days Discharge Instructions: Cleanse the wound with wound cleanser prior to applying a clean dressing using gauze sponges, not tissue or cotton balls. Peri-Wound Care: Skin Prep (Home Health) 1 x Per Day/30 Days Discharge Instructions: Use skin prep as directed Prim Dressing: Dakin's Solution 0.25%, 16 (oz) (Home Health) 1 x Per Day/30 Days ary Discharge Instructions: Moisten gauze with Dakin's solution Secondary Dressing: Zetuvit Plus Silicone Border Sacrum Dressing, Sm, 7x7 (in/in) (Home Health) 1 x Per Day/30 Days Discharge Instructions: Apply silicone border OR ABD PAD. WOUND #2: -  Lower Leg Wound Laterality: Right, Posterior Cleanser: Wound Cleanser (Home Health) 3 x Per Week/30 Days Discharge Instructions: Cleanse the wound with wound cleanser prior to applying a clean dressing using gauze sponges, not tissue or cotton balls. Peri-Wound Care: Sween Lotion (Moisturizing lotion) (Home Health) 3 x Per Week/30 Days Discharge Instructions: Apply moisturizing lotion as directed Prim Dressing: Promogran Prisma Matrix, 4.34 (sq in) (silver collagen) (Home Health) 3 x Per Week/30 Days ary Discharge Instructions: Moisten collagen with saline or hydrogel Secondary Dressing: ABD Pad, 5x9 (Home Health) 3 x Per Week/30 Days Discharge Instructions: Apply over primary dressing as directed. Com pression Wrap: Kerlix Roll 4.5x3.1 (in/yd) 3 x Per Week/30 Days Discharge Instructions: Apply Kerlix and Coban compression as directed. Com pression Wrap: Coban Self-Adherent Wrap 4x5 (in/yd) 3 x Per Week/30 Days Discharge Instructions: Apply over Kerlix as directed. 1. Stage IV sacral wound. I do not disagree with an attempt at a wound VAC but if it is true that there is been no improvement in this wound in 5 months I think we have to start thinking beyond a wound VAC at this point both in terms of diagnostics and therapeutics. His wife states she only recently stopped putting him in a wheelchair which may be a large part of the problem. For now I think he will likely need plain x-rays at least possibly imaging studies such as a CT scan [he apparently has some form of loop recorder which may preclude an MRI]. 2. It would be easy enough to get a piece of bone for pathology and culture here. The possibility of underlying osteomyelitis is really quite high. 3. For now we are going to simply use a Dakin's wet to dry until we see him next time. 4. The right leg wound is probably a pressure ulcer as well there is exposed tendon here with a reasonably clean surface. I think Melburn PopperSantyl is probably done a  good job in this area we use silver collagen as the primary dressing there did not seem to be any vascular issue that was obvious. 5. We talked about this in a lot of detail with the patient and his wife. When they come back in 2 weeks if they want to proceed aggressively they will likely need an imaging study to involve the lower sacrum coccyx  and at least the right ischium. As mentioned a bone biopsy for pathology and culture I do not think would be that hard to do Electronic Signature(s) Signed: 04/16/2022 5:47:10 PM By: Baltazar Najjar MD Entered By: Baltazar Najjar on 04/16/2022 12:53:41 -------------------------------------------------------------------------------- HxROS Details Patient Name: Date of Service: [name] 04/16/2022 9:00 A M Medical Record Number: [id number] Patient Account Number: [id number] Date of Birth/Sex: Treating RN: 1946-03-23 (76 y.o. Tammy Sours Primary Care Provider: Annita Brod Other Clinician: Referring Provider: Treating Provider/Extender: Manuella Ghazi in Treatment: 0 Integumentary (Skin) Complaints and Symptoms: Positive for: Wounds - sacral wound Constitutional Symptoms (General Health) Medical History: Past Medical History Notes: CVA 12/03/2020 JOELL, USMAN ([id number]) [id number] Page 7 of 8 Respiratory Medical History: Past Medical History Notes: PE Cardiovascular Medical History: Positive for: Arrhythmia - A.fib Genitourinary Medical History: Past Medical History Notes: neurogenic bladder-foley Neurologic Medical History: Positive for: Quadriplegia - CVA 2022 Immunizations Pneumococcal Vaccine: Received Pneumococcal Vaccination: Yes Received Pneumococcal Vaccination On or After 60th Birthday: Yes Implantable Devices No devices added Hospitalization / Surgery History Type of Hospitalization/Surgery sepsis 09/09/2021 pressure ulcer started 12/03/2020 CVA Family and  Social History Cancer: Yes - Mother,Father; Never smoker; Marital Status - Married; Alcohol Use: Never; Drug Use: No History; Financial Concerns: No; Food, Clothing or Shelter Needs: No; Support System Lacking: No; Transportation Concerns: No Psychologist, prison and probation services) Signed: 04/16/2022 5:46:55 PM By: Shawn Stall RN, BSN Signed: 04/16/2022 5:47:10 PM By: Baltazar Najjar MD Entered By: Shawn Stall on 04/16/2022 09:43:49 -------------------------------------------------------------------------------- SuperBill Details Patient Name: Date of Service: MALIKAH, LAKEY 04/16/2022 Medical Record Number: [id number] Patient Account Number: [id number] Date of Birth/Sex: Treating RN: [birth year] (76 y.o. 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, complete Facility Procedures : TAVARIOUS, FREEL [employer] ([id number]) [id number] Description: [phone] - WOUND CARE VISIT-LEV 5 EST PT Modifier: [id number] Quantity: 27.pdf Page 8 of 8 1 Physician Procedures : CPT4 Code Description Modifier [id number] [phone] - WC PHYS LEVEL 4 - NEW PT ICD-10 Diagnosis Description L89.154 Pressure ulcer of sacral region, stage 4 L97.918 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

## 2022-04-17 NOTE — Progress Notes (Signed)
GIAVONNI, FONDER ([id number]) [id number] Page 1 of 12 Visit Report for 04/16/2022 Allergy List Details Patient Name: Date of Service: DAKWON, WENBERG 04/16/2022 9:00 A M Medical Record Number: [id number] Patient Account Number: [id number] Date of Birth/Sex: Treating RN: 1945/12/11 (76 y.o. Tammy Sours Primary Care Aisia Correira: Annita Brod Other Clinician: Referring Lasha Echeverria: Treating Naman Spychalski/Extender: Manuella Ghazi in Treatment: 0 Allergies Active Allergies No Known Drug Allergies Allergy Notes Electronic Signature(s) Signed: 04/16/2022 5:46:55 PM By: Shawn Stall RN, BSN Entered By: Shawn Stall on 04/16/2022 09:25:26 -------------------------------------------------------------------------------- Arrival Information Details Patient Name: Date of Service: [name] 04/16/2022 9:00 A M Medical Record Number: [id number] Patient Account Number: [id number] Date of Birth/Sex: Treating RN: Feb 10, 1946 (76 y.o. Tammy Sours Primary Care Wade Asebedo: Annita Brod Other Clinician: Referring Jeanluc Wegman: Treating Masayoshi Couzens/Extender: Manuella Ghazi in Treatment: 0 Visit Information Patient Arrived: Stretcher Arrival Time: 09:20 Accompanied By: stretcher Transfer Assistance: Manual Patient Identification Verified: Yes Secondary Verification Process Completed: Yes Patient Requires Transmission-Based Precautions: No Patient Has Alerts: Yes Patient Alerts: Patient on Blood Thinner Electronic Signature(s) Signed: 04/16/2022 5:46:55 PM By: Shawn Stall RN, BSN Entered By: Shawn Stall on 04/16/2022 09:25:03 -------------------------------------------------------------------------------- Clinic Level of Care Assessment Details Patient Name: Date of Service: MASARU, CHAMBERLIN 04/16/2022 9:00 A Marjean Donna, Kelby Fam ([id number]) [id number].pdf Page 2 of 12 Medical Record Number: [id number] Patient  Account Number: [id number] Date of Birth/Sex: Treating RN: [birth year] (76 y.o. Tammy Sours Primary Care Talullah Abate: Annita Brod Other Clinician: Referring Miyanna Wiersma: Treating Jiyaan Steinhauser/Extender: Manuella Ghazi in Treatment: 0 Clinic Level of Care Assessment Items TOOL 2 Quantity Score X- 1 0 Use when only an EandM is performed on the INITIAL visit ASSESSMENTS - Nursing Assessment / Reassessment X- 1 20 General Physical Exam (combine w/ comprehensive assessment (listed just below) when performed on new pt. evals) X- 1 25 Comprehensive Assessment (HX, ROS, Risk Assessments, Wounds Hx, etc.) ASSESSMENTS - Wound and Skin A ssessment / Reassessment  - 0 Simple Wound Assessment / Reassessment - one wound X- 2 5 Complex Wound Assessment / Reassessment - multiple wounds X- 1 10 Dermatologic / Skin Assessment (not related to wound area) ASSESSMENTS - Ostomy and/or Continence Assessment and Care  - 0 Incontinence Assessment and Management  - 0 Ostomy Care Assessment and Management (repouching, etc.) PROCESS - Coordination of Care  - 0 Simple Patient / Family Education for ongoing care X- 1 20 Complex (extensive) Patient / Family Education for ongoing care X- 1 10 Staff obtains Chiropractor, Records, T Results / Process Orders est X- 1 10 Staff telephones HHA, Nursing Homes / Clarify orders / etc  - 0 Routine Transfer to another Facility (non-emergent condition)  - 0 Routine Hospital Admission (non-emergent condition)  - 0 New Admissions / Manufacturing engineer / Ordering NPWT Apligraf, etc. ,  - 0 Emergency Hospital Admission (emergent condition)  - 0 Simple Discharge Coordination X- 1 15 Complex (extensive) Discharge Coordination PROCESS - Special Needs  - 0 Pediatric / Minor Patient Management  - 0 Isolation Patient Management  - 0 Hearing / Language / Visual special needs  - 0 Assessment of Community assistance  (transportation, D/C planning, etc.)  - 0 Additional assistance / Altered mentation X- 1 15 Support Surface(s) Assessment (bed, cushion, seat, etc.) INTERVENTIONS - Wound Cleansing / Measurement X- 1 5 Wound Imaging (photographs - any number of wounds)  - 0 Wound Tracing (instead of photographs)  - 0 Simple Wound Measurement - one wound X- 2 5 Complex  Wound Measurement - multiple wounds []  - 0 Simple Wound Cleansing - one wound X- 2 5 Complex Wound Cleansing - multiple wounds INTERVENTIONS - Wound Dressings []  - 0 Small Wound Dressing one or multiple wounds X- 1 15 Medium Wound Dressing one or multiple wounds X- 1 20 Large Wound Dressing one or multiple wounds ZUHAIR, LARICCIA ( ) [phone].pdf Page 3 of 12 []  - 0 Application of Medications - injection INTERVENTIONS - Miscellaneous []  - 0 External ear exam []  - 0 Specimen Collection (cultures, biopsies, blood, body fluids, etc.) []  - 0 Specimen(s) / Culture(s) sent or taken to Lab for analysis []  - 0 Patient Transfer (multiple staff / [id number] Lift / Similar devices) []  - 0 Simple Staple / Suture removal (25 or less) []  - 0 Complex Staple / Suture removal (26 or more) []  - 0 Hypo / Hyperglycemic Management (close monitor of Blood Glucose) X- 1 15 Ankle / Brachial Index (ABI) - do not check if billed separately Has the patient been seen at the hospital within the last three years: Yes Total Score: 210 Level Of Care: New/Established - Level 5 Electronic Signature(s) Signed: 04/16/2022 5:46:55 PM By: RN, BSN Entered By: on 04/16/2022 10:23:06 -------------------------------------------------------------------------------- Encounter Discharge Information Details Patient Name: Date of Service: 04/16/2022 9:00 A M Medical Record Number: Michiel Sites Patient Account Number: Date of Birth/Sex: Treating RN: Mar 24, 1946 (76 y.o. 04/18/2022 Primary Care Jamiesha Victoria: Shawn Stall Other Clinician: Referring Sherrey North: Treating Lanae Federer/Extender: Shawn Stall in Treatment: 0 Encounter Discharge Information Items Discharge Condition: Stable Ambulatory Status: Stretcher Discharge Destination: Home Transportation: Private Auto Accompanied By: wife and EMS Schedule Follow-up Appointment: Yes Clinical Summary of Care: Electronic Signature(s) Signed: 04/16/2022 5:46:55 PM By: [name] RN, BSN Entered By: 04/18/2022 on 04/16/2022 10:24:23 -------------------------------------------------------------------------------- Lower Extremity Assessment Details Patient Name: Date of Service: JHAN, CONERY 04/16/2022 9:00 A M Medical Record Number: [id number] Patient Account Number: Tammy Sours Date of Birth/Sex: Treating RN: 1945/11/06 (76 y.o. 04/18/2022 Primary Care Elyza Whitt: Shawn Stall Other Clinician: Referring Rasa Degrazia: Treating Shalandra Leu/Extender: Shawn Stall in Treatment: 0 Edema Assessment Assessed: 04/18/2022: No] [name]: Yes] B[LeftGUSTABO, GORDILLO [id number] [[id number].pdf Page 4 of 12] Edema: [Left: N] [Right: o] Calf Left: Right: Point of Measurement: From Medial Instep 32.2 cm Ankle Left: Right: Point of Measurement: From Medial Instep 18 cm Vascular Assessment Pulses: Dorsalis Pedis Palpable: [Right:Yes] Posterior Tibial Doppler Audible: [Right:Yes] Blood Pressure: Brachial: [Right:173] Ankle: [Right:Dorsalis Pedis: 190 1.10] Electronic Signature(s) Signed: 04/16/2022 5:46:55 PM By: 66 RN, BSN Entered By: Tammy Sours on 04/16/2022 09:54:04 -------------------------------------------------------------------------------- Multi Wound Chart Details Patient Name: Date of Service: Manuella Ghazi 04/16/2022 9:00 A M Medical Record Number: Franne Forts Patient Account Number: Rhodia Albright Date of Birth/Sex:  Treating RN: [birth year] (76 y.o. M) Primary Care Ady Heimann: 04/18/2022 Other Clinician: Referring Merilyn Pagan: Treating Colon Rueth/Extender: Shawn Stall in Treatment: 0 Vital Signs Height(in): 68 Pulse(bpm): 78 Weight(lbs): 150 Blood Pressure(mmHg): 173/86 Body Mass Index(BMI): 22.8 Temperature(F): 98.8 Respiratory Rate(breaths/min): 18 [1:Photos:] [N/A:N/A] Sacrum Right, Posterior Lower Leg N/A Wound Location: Pressure Injury Gradually Appeared N/A Wounding Event: Pressure Ulcer Pressure Ulcer N/A Primary Etiology: Arrhythmia, Quadriplegia Arrhythmia, Quadriplegia N/A Comorbid History: 09/02/2021 11/02/2021 N/A Date Acquired: 0 0 N/A Weeks of Treatment: Open Open N/A Wound Status: No No N/A Wound Recurrence: 3.3x2.6x2.7 6.6x2x0.5 N/A Measurements L x W x D (cm) 6.739 10.367 N/A A (cm) : rea 18.195 5.184 N/A Volume (cm) :  12 12 Position 1 (o'clock): 4 0.5 Maximum Distance 1 (cm): DONOVYN, GUIDICE ([id number]) [phone].pdf Page 5 of 12 7 6  Starting Position 1 (o'clock): 5 8 Ending Position 1 (o'clock): 3.4 0.4 Maximum Distance 1 (cm): Yes Yes N/A Tunneling: Yes Yes N/A Undermining: Category/Stage IV Category/Stage IV N/A Classification: Medium Medium N/A Exudate A mount: Serosanguineous Serosanguineous N/A Exudate Type: red, brown red, brown N/A Exudate Color: Well defined, not attached Epibole N/A Wound Margin: Large (67-100%) Medium (34-66%) N/A Granulation A mount: Red, Pink Red, Pink N/A Granulation Quality: Small (1-33%) Medium (34-66%) N/A Necrotic A mount: Fat Layer (Subcutaneous Tissue): Yes Fat Layer (Subcutaneous Tissue): Yes N/A Exposed Structures: Bone: Yes Tendon: Yes Fascia: No Fascia: No Tendon: No Muscle: No Muscle: No Joint: No Joint: No Bone: No None None N/A Epithelialization: Excoriation: No Excoriation: No N/A Periwound Skin Texture: Induration: No Induration:  No Callus: No Callus: No Crepitus: No Crepitus: No Rash: No Rash: No Scarring: No Scarring: No Maceration: No Maceration: No N/A Periwound Skin Moisture: Dry/Scaly: No Dry/Scaly: No Atrophie Blanche: No Atrophie Blanche: No N/A Periwound Skin Color: Cyanosis: No Cyanosis: No Ecchymosis: No Ecchymosis: No Erythema: No Erythema: No Hemosiderin Staining: No Hemosiderin Staining: No Mottled: No Mottled: No Pallor: No Pallor: No Rubor: No Rubor: No Treatment Notes Wound #1 (Sacrum) Cleanser Wound Cleanser Discharge Instruction: Cleanse the wound with wound cleanser prior to applying a clean dressing using gauze sponges, not tissue or cotton balls. Peri-Wound Care Skin Prep Discharge Instruction: Use skin prep as directed Topical Primary Dressing Dakin's Solution 0.25%, 16 (oz) Discharge Instruction: Moisten gauze with Dakin's solution Secondary Dressing Zetuvit Plus Silicone Border Sacrum Dressing, Sm, 7x7 (in/in) Discharge Instruction: Apply silicone border OR ABD PAD. Secured With Compression Wrap Compression Stockings Add-Ons Wound #2 (Lower Leg) Wound Laterality: Right, Posterior Cleanser Wound Cleanser Discharge Instruction: Cleanse the wound with wound cleanser prior to applying a clean dressing using gauze sponges, not tissue or cotton balls. Peri-Wound Care Sween Lotion (Moisturizing lotion) Discharge Instruction: Apply moisturizing lotion as directed Topical Primary Dressing Promogran Prisma Matrix, 4.34 (sq in) (silver collagen) JAHEIM, CANINO (Lorraine Lax) [phone].pdf Page 6 of 12 Discharge Instruction: Moisten collagen with saline or hydrogel Secondary Dressing ABD Pad, 5x9 Discharge Instruction: Apply over primary dressing as directed. Secured With Compression Wrap Kerlix Roll 4.5x3.1 (in/yd) Discharge Instruction: Apply Kerlix and Coban compression as directed. Coban Self-Adherent Wrap 4x5 (in/yd) Discharge  Instruction: Apply over Kerlix as directed. Compression Stockings Add-Ons Electronic Signature(s) Signed: 04/16/2022 5:47:10 PM By: 04/18/2022 MD Entered By: Baltazar Najjar on 04/16/2022 12:44:18 -------------------------------------------------------------------------------- Multi-Disciplinary Care Plan Details Patient Name: Date of Service: [place], [place] NUEL 04/16/2022 9:00 A M Medical Record Number: 04/18/2022 Patient Account Number: [id number] Date of Birth/Sex: Treating RN: 01-30-1946 (76 y.o. 66 Primary Care Esmeralda Blanford: Tammy Sours Other Clinician: Referring Amiel Sharrow: Treating Romonda Parker/Extender: Annita Brod in Treatment: 0 Active Inactive Orientation to the Wound Care Program Nursing Diagnoses: Knowledge deficit related to the wound healing center program Goals: Patient/caregiver will verbalize understanding of the Wound Healing Center Program Date Initiated: 04/16/2022 Target Resolution Date: 05/08/2022 Goal Status: Active Interventions: Provide education on orientation to the wound center Notes: Osteomyelitis Nursing Diagnoses: Potential for infection: osteomyelitis Goals: Diagnostic evaluation for osteomyelitis completed as ordered Date Initiated: 04/16/2022 Target Resolution Date: 05/09/2022 Goal Status: Active Signs and symptoms for osteomyelitis will be recognized and promptly addressed Date Initiated: 04/16/2022 Target Resolution Date: 05/08/2022 Goal Status: Active Interventions: Assess for signs and symptoms of osteomyelitis resolution every visit Provide education  on osteomyelitis LABRADFORD, SCHNITKER ([id number]) [id number] Page 7 of 12 Treatment Activities: Bone culture : 04/16/2022 T ordered outside of clinic : 04/16/2022 est Notes: Pain, Acute or Chronic Nursing Diagnoses: Potential alteration in comfort, pain Goals: Patient will verbalize adequate pain control and receive pain control  interventions during procedures as needed Date Initiated: 04/16/2022 Target Resolution Date: 05/09/2022 Goal Status: Active Interventions: Encourage patient to take pain medications as prescribed Provide education on pain management Reposition patient for comfort Treatment Activities: Administer pain control measures as ordered : 04/16/2022 Notes: Wound/Skin Impairment Nursing Diagnoses: Knowledge deficit related to ulceration/compromised skin integrity Goals: Patient/caregiver will verbalize understanding of skin care regimen Date Initiated: 04/16/2022 Target Resolution Date: 05/09/2022 Goal Status: Active Interventions: Assess patient/caregiver ability to perform ulcer/skin care regimen upon admission and as needed Assess ulceration(s) every visit Provide education on ulcer and skin care Treatment Activities: Skin care regimen initiated : 04/16/2022 Topical wound management initiated : 04/16/2022 Notes: Electronic Signature(s) Signed: 04/16/2022 5:46:55 PM By: Shawn Stall RN, BSN Entered By: Shawn Stall on 04/16/2022 10:09:28 -------------------------------------------------------------------------------- Pain Assessment Details Patient Name: Date of Service: [name] 04/16/2022 9:00 A M Medical Record Number: [id number] Patient Account Number: [id number] Date of Birth/Sex: Treating RN: 1946-02-04 (76 y.o. Tammy Sours Primary Care Kemet Nijjar: Annita Brod Other Clinician: Referring Reannon Candella: Treating Keyon Liller/Extender: Manuella Ghazi in Treatment: 0 Active Problems Location of Pain Severity and Description of Pain Patient Has Paino No Site Locations [place], [place] ([id number]) [id number] Page 8 of 12 Pain Management and Medication Current Pain Management: Electronic Signature(s) Signed: 04/16/2022 5:46:55 PM By: Shawn Stall RN, BSN Entered By: Shawn Stall on 04/16/2022  09:43:31 -------------------------------------------------------------------------------- Patient/Caregiver Education Details Patient Name: Date of Service: [name] 12/13/2023andnbsp9:00 A M Medical Record Number: [id number] Patient Account Number: [id number] Date of Birth/Gender: Treating RN: 1945-06-17 (76 y.o. Tammy Sours Primary Care Physician: Annita Brod Other Clinician: Referring Physician: Treating Physician/Extender: Manuella Ghazi in Treatment: 0 Education Assessment Education Provided To: Patient and Caregiver wife Education Topics Provided Infection: Handouts: CDC antimicrobial patient education_English, Infection Prevention and Management Methods: Explain/Verbal Responses: Reinforcements needed Welcome T [facility]: o Handouts: Welcome T [facility] o Methods: Explain/Verbal Responses: Reinforcements needed Electronic Signature(s) Signed: 04/16/2022 5:46:55 PM By: Shawn Stall RN, BSN Entered By: Shawn Stall on 04/16/2022 10:09:44 Lorraine Lax ([id number]) [id number] Page 9 of 12 -------------------------------------------------------------------------------- Wound Assessment Details Patient Name: Date of Service: JARIUS, DIEUDONNE 04/16/2022 9:00 A M Medical Record Number: [id number] Patient Account Number: [id number] Date of Birth/Sex: Treating RN: 04-Oct-1945 (76 y.o. Harlon Flor, Millard.Loa Primary Care Preet Perrier: Annita Brod Other Clinician: Referring Antasia Haider: Treating Sharanda Shinault/Extender: Manuella Ghazi in Treatment: 0 Wound Status Wound Number: 1 Primary Etiology: Pressure Ulcer Wound Location: Sacrum Wound Status: Open Wounding Event: Pressure Injury Comorbid History: Arrhythmia, Quadriplegia Date Acquired: 09/02/2021 Weeks Of Treatment: 0 Clustered Wound: No Photos Wound Measurements Length: (cm) 3.3 Width: (cm) 2.6 Depth: (cm) 2.7 Area: (cm)  6.739 Volume: (cm) 18.195 % Reduction in Area: % Reduction in Volume: Epithelialization: None Tunneling: Yes Position (o'clock): 12 Maximum Distance: (cm) 4 Undermining: Yes Starting Position (o'clock): 7 Ending Position (o'clock): 5 Maximum Distance: (cm) 3.4 Wound Description Classification: Category/Stage IV Wound Margin: Well defined, not attached Exudate Amount: Medium Exudate Type: Serosanguineous Exudate Color: red, brown Foul Odor After Cleansing: No Slough/Fibrino Yes Wound Bed Granulation Amount: Large (67-100%) Exposed Structure Granulation Quality: Red, Pink Fascia Exposed: No Necrotic Amount: Small (1-33%) Fat Layer (Subcutaneous Tissue)  Exposed: Yes Necrotic Quality: Adherent Slough Tendon Exposed: No Muscle Exposed: No Joint Exposed: No Bone Exposed: Yes Periwound Skin Texture Texture Color No Abnormalities Noted: No No Abnormalities Noted: No Callus: No Atrophie Blanche: No Crepitus: No Cyanosis: No Excoriation: No Ecchymosis: No Induration: No Erythema: No Rash: No Hemosiderin Staining: No Scarring: No Mottled: No Pallor: No Moisture Rubor: No No Abnormalities Noted: No Dry / Scaly: No Maceration: No NAYAN, PROCH ([id number]) [id number].pdf Page 10 of 12 Treatment Notes Wound #1 (Sacrum) Cleanser Wound Cleanser Discharge Instruction: Cleanse the wound with wound cleanser prior to applying a clean dressing using gauze sponges, not tissue or cotton balls. Peri-Wound Care Skin Prep Discharge Instruction: Use skin prep as directed Topical Primary Dressing Dakin's Solution 0.25%, 16 (oz) Discharge Instruction: Moisten gauze with Dakin's solution Secondary Dressing Zetuvit Plus Silicone Border Sacrum Dressing, Sm, 7x7 (in/in) Discharge Instruction: Apply silicone border OR ABD PAD. Secured With Compression Wrap Compression Stockings Facilities manager) Signed: 04/16/2022 5:46:55 PM By: Shawn Stall RN, BSN Entered By: Shawn Stall on 04/16/2022 09:51:50 -------------------------------------------------------------------------------- Wound Assessment Details Patient Name: Date of Service: JESSUP, OGAS 04/16/2022 9:00 A M Medical Record Number: [id number] Patient Account Number: [id number] Date of Birth/Sex: Treating RN: January 17, 1946 (76 y.o. Tammy Sours Primary Care Jaspal Pultz: Annita Brod Other Clinician: Referring Aivah Putman: Treating Maaliyah Adolph/Extender: Manuella Ghazi in Treatment: 0 Wound Status Wound Number: 2 Primary Etiology: Pressure Ulcer Wound Location: Right, Posterior Lower Leg Wound Status: Open Wounding Event: Gradually Appeared Comorbid History: Arrhythmia, Quadriplegia Date Acquired: 11/02/2021 Weeks Of Treatment: 0 Clustered Wound: No Photos Wound Measurements Pusch, Kaydence ([id number]) Length: (cm) 6.6 Width: (cm) 2 Depth: (cm) 0.5 Area: (cm) 10.367 Volume: (cm) 5.184 [id number] Page 11 of 12 % Reduction in Area: % Reduction in Volume: Epithelialization: None Tunneling: Yes Position (o'clock): 12 Maximum Distance: (cm) 0.5 Undermining: Yes Starting Position (o'clock): 6 Ending Position (o'clock): 8 Maximum Distance: (cm) 0.4 Wound Description Classification: Category/Stage IV Wound Margin: Epibole Exudate Amount: Medium Exudate Type: Serosanguineous Exudate Color: red, brown Foul Odor After Cleansing: No Slough/Fibrino Yes Wound Bed Granulation Amount: Medium (34-66%) Exposed Structure Granulation Quality: Red, Pink Fascia Exposed: No Necrotic Amount: Medium (34-66%) Fat Layer (Subcutaneous Tissue) Exposed: Yes Necrotic Quality: Adherent Slough Tendon Exposed: Yes Muscle Exposed: No Joint Exposed: No Bone Exposed: No Periwound Skin Texture Texture Color No Abnormalities Noted: No No Abnormalities Noted: No Callus: No Atrophie Blanche: No Crepitus: No Cyanosis:  No Excoriation: No Ecchymosis: No Induration: No Erythema: No Rash: No Hemosiderin Staining: No Scarring: No Mottled: No Pallor: No Moisture Rubor: No No Abnormalities Noted: No Dry / Scaly: No Maceration: No Treatment Notes Wound #2 (Lower Leg) Wound Laterality: Right, Posterior Cleanser Wound Cleanser Discharge Instruction: Cleanse the wound with wound cleanser prior to applying a clean dressing using gauze sponges, not tissue or cotton balls. Peri-Wound Care Sween Lotion (Moisturizing lotion) Discharge Instruction: Apply moisturizing lotion as directed Topical Primary Dressing Promogran Prisma Matrix, 4.34 (sq in) (silver collagen) Discharge Instruction: Moisten collagen with saline or hydrogel Secondary Dressing ABD Pad, 5x9 Discharge Instruction: Apply over primary dressing as directed. Secured With Compression Wrap Kerlix Roll 4.5x3.1 (in/yd) Discharge Instruction: Apply Kerlix and Coban compression as directed. Coban Self-Adherent Wrap 4x5 (in/yd) Discharge Instruction: Apply over Kerlix as directed. Compression Stockings [place], Kelby Fam ([id number]) [id number].pdf Page 12 of 12 Add-Ons Electronic Signature(s) Signed: 04/16/2022 5:46:55 PM By: Shawn Stall RN, BSN Entered By: Shawn Stall on 04/16/2022 09:52:35 -------------------------------------------------------------------------------- Vitals Details Patient Name: Date of Service: Smitty Cords, [place] NUEL  04/16/2022 9:00 A M Medical Record Number: [id number] Patient Account Number: [id number] Date of Birth/Sex: Treating RN: 14-Aug-1945 (76 y.o. Harlon FlorM) Deaton, Yvonne KendallBobbi Primary Care Crystal Ellwood: Annita BrodAsenso, Philip Other Clinician: Referring Aliceson Dolbow: Treating Wandy Bossler/Extender: Manuella Ghaziobson, Michael Asenso, Philip Weeks in Treatment: 0 Vital Signs Time Taken: 09:25 Temperature (F): 98.8 Height (in): 68 Pulse (bpm): 78 Source: Stated Respiratory Rate (breaths/min): 18 Weight (lbs): 150 Blood Pressure  (mmHg): 173/86 Source: Stated Reference Range: 80 - 120 mg / dl Body Mass Index (BMI): 22.8 Electronic Signature(s) Signed: 04/16/2022 5:46:55 PM By: Shawn Stalleaton, Bobbi RN, BSN Entered By: Shawn Stalleaton, Bobbi on 04/16/2022 09:30:10

## 2022-04-17 NOTE — Progress Notes (Signed)
JAKYRON, FABRO ([id number]) [id number] Page 1 of 4 Visit Report for 04/16/2022 Abuse Risk Screen Details Patient Name: Date of Service: [name], [name] 04/16/2022 9:00 A M Medical Record Number: [id number] Patient Account Number: [id number] Date of Birth/Sex: Treating RN: June 23, 1945 (76 y.o. Tammy Sours Primary Care Mostafa Yuan: Annita Brod Other Clinician: Referring Burris Matherne: Treating Wendee Hata/Extender: Manuella Ghazi in Treatment: 0 Abuse Risk Screen Items Answer ABUSE RISK SCREEN: Has anyone close to you tried to hurt or harm you recentlyo No Do you feel uncomfortable with anyone in your familyo No Has anyone forced you do things that you didnt want to doo No Electronic Signature(s) Signed: 04/16/2022 5:46:55 PM By: Shawn Stall RN, BSN Entered By: Shawn Stall on 04/16/2022 09:25:35 -------------------------------------------------------------------------------- Activities of Daily Living Details Patient Name: Date of Service: [name], [name] 04/16/2022 9:00 A M Medical Record Number: [id number] Patient Account Number: [id number] Date of Birth/Sex: Treating RN: 06/15/1945 (76 y.o. Tammy Sours Primary Care Babyboy Loya: Annita Brod Other Clinician: Referring Willett Lefeber: Treating Caly Pellum/Extender: Manuella Ghazi in Treatment: 0 Activities of Daily Living Items Answer Activities of Daily Living (Please select one for each item) Drive Automobile Not Able T Medications ake Completely Able Use T elephone Not Able Care for Appearance Not Able Use T oilet Not Able Mady Haagensen / Shower Not Able Dress Self Not Able Feed Self Not Able Walk Not Able Get In / Out Bed Not Able Housework Not Able Prepare Meals Not Able Handle Money Not Able Shop for Self Not Able Electronic Signature(s) Signed: 04/16/2022 5:46:55 PM By: Shawn Stall RN, BSN Entered By: Shawn Stall on 04/16/2022 09:26:32 Lorraine Lax ([id number]) [id number] Page 2 of 4 -------------------------------------------------------------------------------- Education Screening Details Patient Name: Date of Service: KRYSTLE, OBERMAN 04/16/2022 9:00 A M Medical Record Number: [id number] Patient Account Number: [id number] Date of Birth/Sex: Treating RN: [birth year] (76 y.o. Tammy Sours Primary Care Marigene Erler: Annita Brod Other Clinician: Referring Lakeith Careaga: Treating Armaan Pond/Extender: Manuella Ghazi in Treatment: 0 Primary Learner Assessed: Patient Learning Preferences/Education Level/Primary Language Learning Preference: Explanation, Demonstration, Printed Material Highest Education Level: College or Above Preferred Language: English Cognitive Barrier Language Barrier: No Translator Needed: No Memory Deficit: No Emotional Barrier: No Cultural/Religious Beliefs Affecting Medical Care: No Physical Barrier Impaired Vision: No Impaired Hearing: No Decreased Hand dexterity: No Knowledge/Comprehension Knowledge Level: High Comprehension Level: High Ability to understand written instructions: High Ability to understand verbal instructions: High Motivation Anxiety Level: Calm Cooperation: Cooperative Education Importance: Acknowledges Need Interest in Health Problems: Asks Questions Perception: Coherent Willingness to Engage in Self-Management High Activities: Readiness to Engage in Self-Management High Activities: Notes patient currently contracted from [facility] 12/03/2020. Wife caregiver. Electronic Signature(s) Signed: 04/16/2022 5:46:55 PM By: Shawn Stall RN, BSN Entered By: Shawn Stall on 04/16/2022 09:27:41 -------------------------------------------------------------------------------- Fall Risk Assessment Details Patient Name: Date of Service: [name], [name] 04/16/2022 9:00 A M Medical Record Number: [id number] Patient Account Number:  [id number] Date of Birth/Sex: Treating RN: July 01, 1945 (76 y.o. Tammy Sours Primary Care Aleksandar Duve: Annita Brod Other Clinician: Referring Netasha Wehrli: Treating Gloristine Turrubiates/Extender: Manuella Ghazi in Treatment: 0 [place], Kelby Fam ([id number]) [id number] Page 3 of 4 Fall Risk Assessment Items Have you had 2 or more falls in the last 12 monthso 0 No Have you had any fall that resulted in injury in the last 12 monthso 0 No FALLS RISK SCREEN History of falling - immediate or within 3 months 0 No Secondary diagnosis (Do you have 2 or more  medical diagnoseso) 0 No Ambulatory aid None/bed rest/wheelchair/nurse 0 No Crutches/cane/walker 0 No Furniture 0 No Intravenous therapy Access/Saline/Heparin Lock 0 No Gait/Transferring Normal/ bed rest/ wheelchair 0 No Weak (short steps with or without shuffle, stooped but able to lift head while walking, may seek 0 No support from furniture) Impaired (short steps with shuffle, may have difficulty arising from chair, head down, impaired 0 No balance) Mental Status Oriented to own ability 0 No Electronic Signature(s) Signed: 04/16/2022 5:46:55 PM By: Shawn Stall RN, BSN Entered By: Shawn Stall on 04/16/2022 09:28:26 -------------------------------------------------------------------------------- Foot Assessment Details Patient Name: Date of Service: [name] 04/16/2022 9:00 A M Medical Record Number: [id number] Patient Account Number: [id number] Date of Birth/Sex: Treating RN: [birth year] (76 y.o. Tammy Sours Primary Care Matas Burrows: Annita Brod Other Clinician: Referring Arshi Duarte: Treating Rochelle Larue/Extender: Manuella Ghazi in Treatment: 0 Foot Assessment Items Site Locations + = Sensation present, - = Sensation absent, C = Callus, U = Ulcer R = Redness, W = Warmth, M = Maceration, PU = Pre-ulcerative lesion F = Fissure, S = Swelling, D =  Dryness Assessment Right: Left: Other Deformity: No No Prior Foot Ulcer: No No Prior Amputation: No No Charcot Joint: No No [name], [name] ([id number]) [phone] [id number] Page 4 of 4 Ambulatory Status: Gait: Notes nonambulatory. Electronic Signature(s) Signed: 04/16/2022 5:46:55 PM By: Shawn Stall RN, BSN Entered By: Shawn Stall on 04/16/2022 09:29:53 -------------------------------------------------------------------------------- Nutrition Risk Screening Details Patient Name: Date of Service: [name], [name] 04/16/2022 9:00 A M Medical Record Number: [id number] Patient Account Number: [id number] Date of Birth/Sex: Treating RN: March 26, 1946 (76 y.o. Tammy Sours Primary Care Tekela Garguilo: Annita Brod Other Clinician: Referring Rheba Diamond: Treating Jo-Anne Kluth/Extender: Manuella Ghazi in Treatment: 0 Height (in): Weight (lbs): Body Mass Index (BMI): Nutrition Risk Screening Items Score Screening NUTRITION RISK SCREEN: I have an illness or condition that made me change the kind and/or amount of food I eat 2 Yes I eat fewer than two meals per day 0 No I eat few fruits and vegetables, or milk products 0 No I have three or more drinks of beer, liquor or wine almost every day 0 No I have tooth or mouth problems that make it hard for me to eat 0 No I don't always have enough money to buy the food I need 0 No I eat alone most of the time 0 No I take three or more different prescribed or over-the-counter drugs a day 1 Yes Without wanting to, I have lost or gained 10 pounds in the last six months 0 No I am not always physically able to shop, cook and/or feed myself 2 Yes Nutrition Protocols Good Risk Protocol Moderate Risk Protocol 0 Provide education on nutrition High Risk Proctocol Risk Level: Moderate Risk Score: 5 Electronic Signature(s) Signed: 04/16/2022 5:46:55 PM By: Shawn Stall RN, BSN Entered By: Shawn Stall on  04/16/2022 09:29:36

## 2022-05-07 ENCOUNTER — Other Ambulatory Visit (HOSPITAL_BASED_OUTPATIENT_CLINIC_OR_DEPARTMENT_OTHER): Payer: Self-pay | Admitting: General Surgery

## 2022-05-07 ENCOUNTER — Encounter (HOSPITAL_BASED_OUTPATIENT_CLINIC_OR_DEPARTMENT_OTHER): Payer: Medicare Other | Attending: General Surgery | Admitting: General Surgery

## 2022-05-07 DIAGNOSIS — L89154 Pressure ulcer of sacral region, stage 4: Secondary | ICD-10-CM | POA: Insufficient documentation

## 2022-05-07 DIAGNOSIS — Z86711 Personal history of pulmonary embolism: Secondary | ICD-10-CM | POA: Diagnosis not present

## 2022-05-07 DIAGNOSIS — G8221 Paraplegia, complete: Secondary | ICD-10-CM | POA: Insufficient documentation

## 2022-05-07 DIAGNOSIS — Z8673 Personal history of transient ischemic attack (TIA), and cerebral infarction without residual deficits: Secondary | ICD-10-CM | POA: Insufficient documentation

## 2022-05-07 DIAGNOSIS — L97918 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 with other specified severity: Secondary | ICD-10-CM | POA: Diagnosis not present

## 2022-05-07 DIAGNOSIS — Z7901 Long term (current) use of anticoagulants: Secondary | ICD-10-CM | POA: Insufficient documentation

## 2022-05-07 DIAGNOSIS — I4891 Unspecified atrial fibrillation: Secondary | ICD-10-CM | POA: Diagnosis not present

## 2022-05-07 NOTE — Progress Notes (Addendum)
[name], [name] ([id number]) [id number] Page 1 of 8 Visit Report for 05/07/2022 Arrival Information Details Patient Name: Date of Service: [name], [name] [place] 05/07/2022 10:45 A M Medical Record Number: [id number] Patient Account Number: [id number] Date of Birth/Sex: Treating RN: [birth year] (77 y.o. [name] Primary Care Senovia Gauer: Annita Brod Other Clinician: Referring Kester Stimpson: Treating Mynor Witkop/Extender: Satira Sark in Treatment: 3 Visit Information History Since Last Visit Added or deleted any medications: No Patient Arrived: Stretcher Any new allergies or adverse reactions: No Arrival Time: 10:52 Had a fall or experienced change in No Accompanied By: wife activities of daily living that may affect Transfer Assistance: Stretcher risk of falls: Patient Identification Verified: Yes Signs or symptoms of abuse/neglect since last visito No Secondary Verification Process Completed: Yes Hospitalized since last visit: No Patient Requires Transmission-Based Precautions: No Implantable device outside of the clinic excluding No Patient Has Alerts: Yes cellular tissue based products placed in the center Patient Alerts: Patient on Blood Thinner since last visit: Has Dressing in Place as Prescribed: Yes Pain Present Now: No Electronic Signature(s) Signed: 05/07/2022 4:35:26 PM By: Samuella Bruin Entered By: Samuella Bruin on 05/07/2022 10:55:43 -------------------------------------------------------------------------------- Encounter Discharge Information Details Patient Name: Date of Service: [name] 05/07/2022 10:45 A M Medical Record Number: [id number] Patient Account Number: [id number] Date of Birth/Sex: Treating RN: Jan 30, 1946 (77 y.o. [name] Primary Care Sloane Palmer: Annita Brod Other Clinician: Referring Maycen Degregory: Treating Montrice Montuori/Extender: Satira Sark in Treatment:  3 Encounter Discharge Information Items Post Procedure Vitals Discharge Condition: Stable Temperature (F): 97.8 Ambulatory Status: Stretcher Pulse (bpm): 56 Discharge Destination: Home Respiratory Rate (breaths/min): 18 Transportation: Private Auto Blood Pressure (mmHg): 148/72 Accompanied By: wife Schedule Follow-up Appointment: Yes Clinical Summary of Care: Patient Declined Electronic Signature(s) Signed: 05/07/2022 4:35:26 PM By: Samuella Bruin Entered By: Samuella Bruin on 05/07/2022 11:59:12 [name] ([id number]) [id number] Page 2 of 8 -------------------------------------------------------------------------------- Lower Extremity Assessment Details Patient Name: Date of Service: [name], [name] 05/07/2022 10:45 A M Medical Record Number: [id number] Patient Account Number: [id number] Date of Birth/Sex: Treating RN: 1945-07-31 (77 y.o. [name] Primary Care Celeste Candelas: Annita Brod Other Clinician: Referring Aveer Bartow: Treating Alecea Trego/Extender: Satira Sark in Treatment: 3 Edema Assessment Assessed: Kyra Searles: No] [Right: No] Edema: [Left: N] [Right: o] Calf Left: Right: Point of Measurement: From Medial Instep 32.2 cm Ankle Left: Right: Point of Measurement: From Medial Instep 18 cm Electronic Signature(s) Signed: 05/07/2022 4:35:26 PM By: Samuella Bruin Entered By: Samuella Bruin on 05/07/2022 10:55:58 -------------------------------------------------------------------------------- Multi Wound Chart Details Patient Name: Date of Service: [name] 05/07/2022 10:45 A M Medical Record Number: [id number] Patient Account Number: [id number] Date of Birth/Sex: Treating RN: August 29, 1945 (77 y.o. M) Primary Care Victoria Euceda: Annita Brod Other Clinician: Referring Suvan Stcyr: Treating Hassani Sliney/Extender: Satira Sark in Treatment: 3 Vital Signs Height(in): 68 Pulse(bpm):  56 Weight(lbs): 150 Blood Pressure(mmHg): 148/72 Body Mass Index(BMI): 22.8 Temperature(F): 97.8 Respiratory Rate(breaths/min): 18 [1:Photos:] [N/A:N/A] Sacrum Right, Posterior Lower Leg N/A Wound Location: Pressure Injury Gradually Appeared N/A Wounding Event: Pressure Ulcer Pressure Ulcer N/A Primary Etiology: Arrhythmia, Quadriplegia Arrhythmia, Quadriplegia N/A Comorbid History: 09/02/2021 11/02/2021 N/A Date Acquired: 3 3 N/A Tania Ade of TreatmentLorraine Pacheco ([id number]) [id number] Page 3 of 8 Open Open N/A Wound Status: No No N/A Wound Recurrence: 3.5x2.6x1.7 5.5x1.1x0.2 N/A Measurements L x W x D (cm) 7.147 4.752 N/A A (cm) : rea 12.15 0.95 N/A Volume (cm) : -6.10% 54.20% N/A % Reduction in A rea: 33.20% 81.70% N/A % Reduction in  Volume: 12 Starting Position 1 (o'clock): 10 Ending Position 1 (o'clock): 4.6 Maximum Distance 1 (cm): Yes No N/A Undermining: Category/Stage IV Category/Stage IV N/A Classification: Medium Medium N/A Exudate A mount: Serosanguineous Serosanguineous N/A Exudate Type: red, brown red, brown N/A Exudate Color: Well defined, not attached Epibole N/A Wound Margin: Large (67-100%) Large (67-100%) N/A Granulation A mount: Red, Pink Red, Pink N/A Granulation Quality: Small (1-33%) Small (1-33%) N/A Necrotic A mount: Fat Layer (Subcutaneous Tissue): Yes Fat Layer (Subcutaneous Tissue): Yes N/A Exposed Structures: Bone: Yes Tendon: Yes Fascia: No Fascia: No Tendon: No Muscle: No Muscle: No Joint: No Joint: No Bone: No None None N/A Epithelialization: N/A Debridement - Selective/Open Wound N/A Debridement: Pre-procedure Verification/Time Out N/A 11:17 N/A Taken: N/A Slough N/A Tissue Debrided: N/A Non-Viable Tissue N/A Level: N/A 6.05 N/A Debridement A (sq cm): rea N/A Curette N/A Instrument: N/A Minimum N/A Bleeding: N/A Pressure N/A Hemostasis A chieved: N/A Procedure was tolerated  well N/A Debridement Treatment Response: N/A 5.5x1.1x0.2 N/A Post Debridement Measurements L x W x D (cm) N/A 0.95 N/A Post Debridement Volume: (cm) N/A Category/Stage IV N/A Post Debridement Stage: Excoriation: No Scarring: Yes N/A Periwound Skin Texture: Induration: No Excoriation: No Callus: No Induration: No Crepitus: No Callus: No Rash: No Crepitus: No Scarring: No Rash: No Maceration: No Maceration: No N/A Periwound Skin Moisture: Dry/Scaly: No Dry/Scaly: No Atrophie Blanche: No Atrophie Blanche: No N/A Periwound Skin Color: Cyanosis: No Cyanosis: No Ecchymosis: No Ecchymosis: No Erythema: No Erythema: No Hemosiderin Staining: No Hemosiderin Staining: No Mottled: No Mottled: No Pallor: No Pallor: No Rubor: No Rubor: No Biopsy Debridement N/A Procedures Performed: Treatment Notes Wound #1 (Sacrum) Cleanser Wound Cleanser Discharge Instruction: Cleanse the wound with wound cleanser prior to applying a clean dressing using gauze sponges, not tissue or cotton balls. Peri-Wound Care Skin Prep Discharge Instruction: Use skin prep as directed Topical Primary Dressing Dakin's Solution 0.25%, 16 (oz) Discharge Instruction: Moisten gauze with Dakin's solution Secondary Dressing Zetuvit Plus Silicone Border Sacrum Dressing, Sm, 7x7 (in/in) Discharge Instruction: Apply silicone border OR ABD PAD. Secured With [employer], 4.5x3.1 (in/yd) Discharge Instruction: Secure with Kerlix as directed. [name], [name] ([id number]) [id number] Page 4 of 8 Compression Wrap Compression Stockings Add-Ons Wound #2 (Lower Leg) Wound Laterality: Right, Posterior Cleanser Wound Cleanser Discharge Instruction: Cleanse the wound with wound cleanser prior to applying a clean dressing using gauze sponges, not tissue or cotton balls. Peri-Wound Care Sween Lotion (Moisturizing lotion) Discharge Instruction: Apply moisturizing lotion as  directed Topical Primary Dressing Promogran Prisma Matrix, 4.34 (sq in) (silver collagen) Discharge Instruction: Moisten collagen with saline or hydrogel Secondary Dressing ABD Pad, 5x9 Discharge Instruction: Apply over primary dressing as directed. Secured With Compression Wrap Kerlix Roll 4.5x3.1 (in/yd) Discharge Instruction: Apply Kerlix and Coban compression as directed. Coban Self-Adherent Wrap 4x5 (in/yd) Discharge Instruction: Apply over Kerlix as directed. Compression Stockings Add-Ons Electronic Signature(s) Signed: 05/07/2022 12:25:12 PM By: Duanne Guess MD FACS Entered By: Duanne Guess on 05/07/2022 12:25:12 -------------------------------------------------------------------------------- Multi-Disciplinary Care Plan Details Patient Name: Date of Service: [name], [name] 05/07/2022 10:45 A M Medical Record Number: [id number] Patient Account Number: [id number] Date of Birth/Sex: Treating RN: 23-Jul-1945 (77 y.o. [name] Primary Care Markiesha Delia: Annita Brod Other Clinician: Referring Milah Recht: Treating Rollin Kotowski/Extender: Satira Sark in Treatment: 3 Active Inactive Electronic Signature(s) Signed: 11/04/2022 3:03:48 PM By: Samuella Bruin Previous Signature: 05/07/2022 4:35:26 PM Version By: Samuella Bruin Entered By: Samuella Bruin on 06/16/2022 11:34:11 [name] ([id number]) [id number] Page 5 of 8 --------------------------------------------------------------------------------  Pain Assessment Details Patient Name: Date of Service: [name], [name] 05/07/2022 10:45 A M Medical Record Number: [id number] Patient Account Number: [id number] Date of Birth/Sex: Treating RN: December 30, 1945 (77 y.o. [name] Primary Care Rigley Niess: Annita Brod Other Clinician: Referring Chellsie Gomer: Treating Joanny Dupree/Extender: Satira Sark in Treatment: 3 Active Problems Location  of Pain Severity and Description of Pain Patient Has Paino No Site Locations Rate the pain. Current Pain Level: 0 Pain Management and Medication Current Pain Management: Electronic Signature(s) Signed: 05/07/2022 4:35:26 PM By: Samuella Bruin Entered By: Samuella Bruin on 05/07/2022 10:55:54 -------------------------------------------------------------------------------- Patient/Caregiver Education Details Patient Name: Date of Service: [name] 1/3/2024andnbsp10:45 A M Medical Record Number: [id number] Patient Account Number: [id number] Date of Birth/Gender: Treating RN: [birth year] (77 y.o. [name] Primary Care Physician: Annita Brod Other Clinician: Referring Physician: Treating Physician/Extender: Satira Sark in Treatment: 3 Education Assessment Education Provided To: Patient Education Topics Provided Wound Debridement: Methods: Explain/Verbal Responses: Reinforcements needed, State content correctly Electronic Signature(s) [name], [name] ([id number]) [id number] Page 6 of 8 Signed: 05/07/2022 4:35:26 PM By: Gelene Mink By: Samuella Bruin on 05/07/2022 11:12:48 -------------------------------------------------------------------------------- Wound Assessment Details Patient Name: Date of Service: [name], [name] 05/07/2022 10:45 A M Medical Record Number: [id number] Patient Account Number: [id number] Date of Birth/Sex: Treating RN: 02-Jun-1945 (77 y.o. [name] Primary Care Demiah Gullickson: Annita Brod Other Clinician: Referring Vasilios Ottaway: Treating Arvilla Salada/Extender: Satira Sark in Treatment: 3 Wound Status Wound Number: 1 Primary Etiology: Pressure Ulcer Wound Location: Sacrum Wound Status: Open Wounding Event: Pressure Injury Comorbid History: Arrhythmia, Quadriplegia Date Acquired: 09/02/2021 Weeks Of Treatment: 3 Clustered Wound:  No Photos Wound Measurements Length: (cm) 3.5 Width: (cm) 2.6 Depth: (cm) 1.7 Area: (cm) 7.147 Volume: (cm) 12.15 % Reduction in Area: -6.1% % Reduction in Volume: 33.2% Epithelialization: None Tunneling: No Undermining: Yes Starting Position (o'clock): 12 Ending Position (o'clock): 10 Maximum Distance: (cm) 4.6 Wound Description Classification: Category/Stage IV Wound Margin: Well defined, not attached Exudate Amount: Medium Exudate Type: Serosanguineous Exudate Color: red, brown Foul Odor After Cleansing: No Slough/Fibrino Yes Wound Bed Granulation Amount: Large (67-100%) Exposed Structure Granulation Quality: Red, Pink Fascia Exposed: No Necrotic Amount: Small (1-33%) Fat Layer (Subcutaneous Tissue) Exposed: Yes Necrotic Quality: Adherent Slough Tendon Exposed: No Muscle Exposed: No Joint Exposed: No Bone Exposed: Yes Periwound Skin Texture Texture Color No Abnormalities Noted: No No Abnormalities Noted: No Callus: No Atrophie Blanche: No Crepitus: No Cyanosis: No [name], [name] ([id number]) [id number] Page 7 of 8 Excoriation: No Ecchymosis: No Induration: No Erythema: No Rash: No Hemosiderin Staining: No Scarring: No Mottled: No Pallor: No Moisture Rubor: No No Abnormalities Noted: No Dry / Scaly: No Maceration: No Electronic Signature(s) Signed: 05/07/2022 4:35:26 PM By: Samuella Bruin Entered By: Samuella Bruin on 05/07/2022 11:08:11 -------------------------------------------------------------------------------- Wound Assessment Details Patient Name: Date of Service: [name], [name] 05/07/2022 10:45 A M Medical Record Number: [id number] Patient Account Number: [id number] Date of Birth/Sex: Treating RN: March 24, 1946 (77 y.o. [name] Primary Care Tayten Bergdoll: Annita Brod Other Clinician: Referring Ginamarie Banfield: Treating Jailin Manocchio/Extender: Satira Sark in Treatment: 3 Wound  Status Wound Number: 2 Primary Etiology: Pressure Ulcer Wound Location: Right, Posterior Lower Leg Wound Status: Open Wounding Event: Gradually Appeared Comorbid History: Arrhythmia, Quadriplegia Date Acquired: 11/02/2021 Weeks Of Treatment: 3 Clustered Wound: No Photos Wound Measurements Length: (cm) 5.5 Width: (cm) 1.1 Depth: (cm) 0.2 Area: (cm) 4.752 Volume: (cm) 0.95 % Reduction in Area: 54.2% % Reduction in Volume: 81.7% Epithelialization: None Tunneling: No  Undermining: No Wound Description Classification: Category/Stage IV Wound Margin: Epibole Exudate Amount: Medium Exudate Type: Serosanguineous Exudate Color: red, brown Foul Odor After Cleansing: No Slough/Fibrino Yes Wound Bed Granulation Amount: Large (67-100%) Exposed Structure Granulation Quality: Red, Pink Fascia Exposed: No Necrotic Amount: Small (1-33%) Fat Layer (Subcutaneous Tissue) Exposed: Yes Necrotic Quality: Adherent Slough Tendon Exposed: Yes Muscle Exposed: No Joint Exposed: No [place], [name] ([id number]) [id number] Page 8 of 8 Bone Exposed: No Periwound Skin Texture Texture Color No Abnormalities Noted: No No Abnormalities Noted: No Callus: No Atrophie Blanche: No Crepitus: No Cyanosis: No Excoriation: No Ecchymosis: No Induration: No Erythema: No Rash: No Hemosiderin Staining: No Scarring: Yes Mottled: No Pallor: No Moisture Rubor: No No Abnormalities Noted: No Dry / Scaly: No Maceration: No Electronic Signature(s) Signed: 05/07/2022 4:35:26 PM By: Samuella Bruin Entered By: Samuella Bruin on 05/07/2022 11:07:50 -------------------------------------------------------------------------------- Vitals Details Patient Name: Date of Service: [name] 05/07/2022 10:45 A M Medical Record Number: [id number] Patient Account Number: [id number] Date of Birth/Sex: Treating RN: [birth year] (77 y.o. [name] Primary Care Mosella Kasa: Annita Brod Other Clinician: Referring Tumeka Chimenti: Treating Nayvie Lips/Extender: Satira Sark in Treatment: 3 Vital Signs Time Taken: 10:58 Temperature (F): 97.8 Height (in): 68 Pulse (bpm): 56 Weight (lbs): 150 Respiratory Rate (breaths/min): 18 Body Mass Index (BMI): 22.8 Blood Pressure (mmHg): 148/72 Reference Range: 80 - 120 mg / dl Electronic Signature(s) Signed: 05/07/2022 4:35:26 PM By: Samuella Bruin Entered By: Samuella Bruin on 05/07/2022 10:58:46

## 2022-05-07 NOTE — Progress Notes (Addendum)
AEON, CAULLEY ([id number]) [id number] Page 1 of 11 Visit Report for 05/07/2022 Biopsy Details Patient Name: Date of Service: [name], [name] 05/07/2022 10:45 A M Medical Record Number: [id number] Patient Account Number: [id number] Date of Birth/Sex: Treating RN: [birth year] (77 y.o. Janyth Contes Primary Care Provider: Clovia Cuff Other Clinician: Referring Provider: Treating Provider/Extender: Milagros Evener in Treatment: 3 Biopsy Performed for: Wound #1 Sacrum Location(s): Other: bone Performed By: Physician Fredirick Maudlin, MD Tissue Punch: No Number of Specimens T aken: 2 Specimen Sent T Pathology: o Yes Level of Consciousness (Pre-procedure): Awake and Alert Pre-procedure Verification/Time-Out Taken: Yes - 11:20 Instrument: Rongeur Bleeding: Moderate Hemostasis Achieved: Other Topical Anticoagulant bone wax Procedural Pain: 0 Post Procedural Pain: 0 Response to Treatment: Procedure was tolerated well Level of Consciousness (Post-procedure): Awake and Alert Post Procedure Diagnosis Same as Pre-procedure Notes scribed for Dr. Celine Ahr by Adline Peals, RN Electronic Signature(s) Signed: 05/07/2022 12:38:13 PM By: Fredirick Maudlin MD FACS Signed: 05/07/2022 4:35:26 PM By: Adline Peals Entered By: Adline Peals on 05/07/2022 11:24:24 -------------------------------------------------------------------------------- Chief Complaint Document Details Patient Name: Date of Service: [name], [name] 05/07/2022 10:45 A M Medical Record Number: [id number] Patient Account Number: [id number] Date of Birth/Sex: Treating RN: 06/29/1945 (77 y.o. M) Primary Care Provider: Clovia Cuff Other Clinician: Referring Provider: Treating Provider/Extender: Milagros Evener in Treatment: 3 Information Obtained from: Patient Chief Complaint 04/16/2022; patient is here for a second opinion accompanied by his  wife with regards to a sacral pressure ulcer and an area on his right posterior lateral lower leg Electronic Signature(s) Signed: 05/07/2022 12:25:47 PM By: Fredirick Maudlin MD FACS Entered By: Fredirick Maudlin on 05/07/2022 12:25:46 Rande Brunt ([id number] Page 2 of 11 -------------------------------------------------------------------------------- Debridement Details Patient Name: Date of Service: [name], [name] 05/07/2022 10:45 A M Medical Record Number: [id number] Patient Account Number: [id number] Date of Birth/Sex: Treating RN: Mar 20, 1946 (77 y.o. Janyth Contes Primary Care Provider: Clovia Cuff Other Clinician: Referring Provider: Treating Provider/Extender: Milagros Evener in Treatment: 3 Debridement Performed for Assessment: Wound #2 Right,Posterior Lower Leg Performed By: Physician Fredirick Maudlin, MD Debridement Type: Debridement Level of Consciousness (Pre-procedure): Awake and Alert Pre-procedure Verification/Time Out Yes - 11:17 Taken: Start Time: 11:17 T Area Debrided (L x W): otal 5.5 (cm) x 1.1 (cm) = 6.05 (cm) Tissue and other material debrided: Non-Viable, Slough, Slough Level: Non-Viable Tissue Debridement Description: Selective/Open Wound Instrument: Curette Bleeding: Minimum Hemostasis Achieved: Pressure Response to Treatment: Procedure was tolerated well Level of Consciousness (Post- Awake and Alert procedure): Post Debridement Measurements of Total Wound Length: (cm) 5.5 Stage: Category/Stage IV Width: (cm) 1.1 Depth: (cm) 0.2 Volume: (cm) 0.95 Character of Wound/Ulcer Post Debridement: Improved Post Procedure Diagnosis Same as Pre-procedure Notes scribed for Dr. Celine Ahr by Adline Peals, RN Electronic Signature(s) Signed: 05/07/2022 12:38:13 PM By: Fredirick Maudlin MD FACS Signed: 05/07/2022 4:35:26 PM By: Adline Peals Entered By: Adline Peals on 05/07/2022  11:19:48 -------------------------------------------------------------------------------- Debridement Details Patient Name: Date of Service: [name] 05/07/2022 10:45 A M Medical Record Number: [id number] Patient Account Number: [id number] Date of Birth/Sex: Treating RN: 26-Dec-1945 (77 y.o. M) Primary Care Provider: Clovia Cuff Other Clinician: Referring Provider: Treating Provider/Extender: Milagros Evener in Treatment: 3 Debridement Performed for Assessment: Wound #1 Sacrum Performed By: Physician Fredirick Maudlin, MD Debridement Type: Debridement Level of Consciousness (Pre-procedure): Awake and Alert Pre-procedure Verification/Time Out Yes - 11:17 Taken: Start Time: 11:17 Pain Control: Lidocaine 5% topical ointment [name], [name] ([id number]) [id number] Page 3 of 11  T Area Debrided (L x W): otal 0.5 (cm) x 0.5 (cm) = 0.25 (cm) Tissue and other material debrided: Non-Viable, Bone Level: Skin/Subcutaneous Tissue/Muscle/Bone Debridement Description: Excisional Instrument: Rongeur Specimen: Tissue Culture Number of Specimens T aken: 1 Bleeding: Minimum Hemostasis Achieved: Other Topical Anticoagulant : Response to Treatment: Procedure was tolerated well Level of Consciousness (Post- Awake and Alert procedure): Post Debridement Measurements of Total Wound Length: (cm) 3.5 Stage: Category/Stage IV Width: (cm) 2.6 Depth: (cm) 1.7 Volume: (cm) 12.15 Character of Wound/Ulcer Post Debridement: Requires Further Debridement Post Procedure Diagnosis Same as Pre-procedure Electronic Signature(s) Signed: 05/12/2022 8:09:05 AM By: Fredirick Maudlin MD FACS Previous Signature: 05/09/2022 8:12:04 AM Version By: Fredirick Maudlin MD FACS Entered By: Fredirick Maudlin on 05/12/2022 08:09:04 -------------------------------------------------------------------------------- HPI Details Patient Name: Date of Service: [name]  05/07/2022 10:45 A M Medical Record Number: [id number] Patient Account Number: [id number] Date of Birth/Sex: Treating RN: [birth year] (77 y.o. M) Primary Care Provider: Clovia Cuff Other Clinician: Referring Provider: Treating Provider/Extender: Milagros Evener in Treatment: 3 History of Present Illness HPI Description: ADMISSION 04/16/2022 This is a 77 year old man who arrived accompanied by his wife. 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 During this admission he had pulmonary emboli, atrial fibrillation and he is on chronic anticoagulation. He is cared for at home by home care as well as his wife. He has a suprapubic catheter. His wife states that his wound on his sacrum has been there for about a year. They have a doctor via an in-home care service. He ordered a wound VAC on this sometime in the mid summer in July but they have not had any improvement per his wife although I do not have any documentation on this. Apparently the home health nurses do not think this is improved either. More recently he has developed an area on his right posterior lower leg. This is likely a pressure area as well. They have been using Santyl He apparently was on antibiotics up until about a week ago but is finished these. His home health agency is [facility]. He had a group 2 mattress but he did not like this so he simply has a gel overlay now. Past medical history includes the after mentioned stroke syndromes, chronic suprapubic catheter, atrial fibrillation, history of PE, autonomic dysreflexia. He is on chronic anticoagulation His ABI on the right was 1.1 05/07/2022: The patient is new to me, having seen Dr. Dellia Nims at his initial visit. The wound on his lateral leg no longer has exposed tendon. There is slough overlying a bit of granulation tissue. According to the patient's  wife, there has been no significant change to the sacral ulcer. A Level 3 mattress was ordered and apparently they do not care for it. Electronic Signature(s) Signed: 05/07/2022 12:27:01 PM By: Fredirick Maudlin MD FACS Entered By: Fredirick Maudlin on 05/07/2022 12:27:01 Rande Brunt ([id number]) [id number] Page 4 of 11 -------------------------------------------------------------------------------- Physical Exam Details Patient Name: Date of Service: [name], [name] 05/07/2022 10:45 A M Medical Record Number: [id number] Patient Account Number: [id number] Date of Birth/Sex: Treating RN: 11-Jan-1946 (77 y.o. M) Primary Care Provider: Clovia Cuff Other Clinician: Referring Provider: Treating Provider/Extender: Milagros Evener in Treatment: 3 Constitutional Slightly hypertensive. Slightly bradycardic, asymptomatic. . . No acute distress. Respiratory Normal work of breathing on room air. Notes 05/07/2022: The wound on his lateral leg no longer  has exposed tendon. There is slough overlying a bed of granulation tissue. According to the patient's wife, there has been no significant change to the sacral ulcer. Electronic Signature(s) Signed: 05/07/2022 12:28:07 PM By: Fredirick Maudlin MD FACS Entered By: Fredirick Maudlin on 05/07/2022 12:28:07 -------------------------------------------------------------------------------- Physician Orders Details Patient Name: Date of Service: [name], [name] 05/07/2022 10:45 A M Medical Record Number: [id number] Patient Account Number: [id number] Date of Birth/Sex: Treating RN: 1945-07-03 (77 y.o. 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 - Dr. Celine Ahr Room 1 ***stretcher**** need extra time. Return A Bathing/ Shower/ Hygiene May shower and wash wound with soap and water. Off-Loading A fluidized (Group 3) mattress - will order through medical modalities for a group 3. ir Turn and reposition every 2 hours Other: - minimal time up in the chair. Only for meals and back to bed. Additional Orders / Instructions Follow Nutritious Diet - increase protein to aid in wound healing. Home Health No change in wound care orders this week; continue Home Health for wound care. May utilize formulary equivalent dressing for wound treatment orders unless otherwise specified. Other Home Health Orders/Instructions: - [facility] GURLEY, SCHUYLER ([id number]) [id number] Page 5 of 11 Wound Treatment Wound #1 - Sacrum Cleanser: Wound Cleanser (Home Health) 1 x Per Day/30 Days Discharge Instructions: Cleanse the wound with wound cleanser prior to applying a clean dressing using gauze sponges, not tissue or cotton balls. Peri-Wound Care: Skin Prep (Home Health) 1 x Per Day/30 Days Discharge Instructions: Use skin prep as directed Prim Dressing: Dakin's Solution 0.25%, 16 (oz) (Home Health) 1 x Per Day/30 Days ary Discharge Instructions: Moisten gauze with Dakin's solution Secondary Dressing: Zetuvit Plus Silicone Border Sacrum Dressing, Sm, 7x7 (in/in) (Home Health) 1 x Per Day/30 Days Discharge Instructions: Apply silicone border OR ABD PAD. Secured With: [employer], 4.5x3.1 (in/yd) 1 x Per Day/30 Days Discharge Instructions: Secure with Kerlix as directed. Wound #2 - Lower Leg Wound Laterality: Right, Posterior Cleanser: Wound Cleanser (Home Health) 3 x Per Week/30 Days Discharge Instructions: Cleanse the wound with wound cleanser  prior to applying a clean dressing using gauze sponges, not tissue or cotton balls. Peri-Wound Care: Sween Lotion (Moisturizing lotion) (Home Health) 3 x Per Week/30 Days Discharge Instructions: Apply moisturizing lotion as directed Prim Dressing: Promogran Prisma Matrix, 4.34 (sq in) (silver collagen) (Home Health) 3 x Per Week/30 Days ary Discharge Instructions: Moisten collagen with saline or hydrogel Secondary Dressing: ABD Pad, 5x9 (Home Health) 3 x Per Week/30 Days Discharge Instructions: Apply over primary dressing as directed. Compression Wrap: Kerlix Roll 4.5x3.1 (in/yd) 3 x Per Week/30 Days Discharge Instructions: Apply Kerlix and Coban compression as directed. Compression Wrap: Coban Self-Adherent Wrap 4x5 (in/yd) 3 x Per Week/30 Days Discharge Instructions: Apply over Kerlix as directed. 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 - Pressure ulcer of sacral region, stage 4) Laboratory Bacteria identified in Tissue by Biopsy culture (MICRO) - Bone biopsy of nonhealing wound to sacrum LOINC CodeQI:[id number] Convenience Name: Biopsy specimen culture naerobe culture (MICRO) - PCR of nonhealing wound to sacrum Bacteria  identified in Unspecified specimen by A LOINC Code: [id number] Convenience Name: Anaerobic culture Electronic Signature(s) Signed: 05/09/2022 1:30:14 PM By: Fredirick Maudlin MD FACS Signed: 05/09/2022 3:48:53 PM By: Adline Peals Previous Signature: 05/07/2022 12:38:13 PM Version By: Fredirick Maudlin MD FACS Entered By: Adline Peals on 05/09/2022 10:58:36 Prescription 05/07/2022 -------------------------------------------------------------------------------- Dennard Nip MD Patient Name: Provider: 1945/07/17 [id number] Date of Birth: NPI#: [id number] Sex: DEA #: [phone] [id number] Phone #: License #: Rande Brunt ([id number])  [id number] Page 6 of [place] Patient Address: [address] [place], [place] [address] [place], [place] [address] [phone] Allergies No Known Drug Allergies Provider's Orders Bacteria identified in Tissue by Biopsy culture - Bone biopsy of nonhealing wound to sacrum LOINC Code: 20474-3 Convenience Name: Biopsy specimen culture Hand Signature: Date(s): Prescription 05/07/2022 Dennard Nip MD Patient Name: Provider: 22-Sep-1945 [id number] Date of Birth: NPI#: Jerilynn Mages [id number] Sex: DEA #: [phone] [id number] Phone #: License #: [place] Patient Address: [address] [place], [place] [address] [place], [place] [address] [phone] Allergies No Known Drug Allergies Provider's Orders Infectious Disease - ICD10: L89.154 - 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 and positive culture (results attached) Hand Signature: Date(s): Electronic Signature(s) Signed: 05/09/2022 1:30:14 PM By: Fredirick Maudlin MD FACS Signed: 05/09/2022 3:48:53 PM By: Adline Peals Previous Signature: 05/07/2022 12:37:17 PM Version By: Fredirick Maudlin MD FACS Entered By: Adline Peals on 05/09/2022 10:58:37 -------------------------------------------------------------------------------- Problem List Details Patient Name: Date of Service: [name], [name] 05/07/2022 10:45 A M Medical Record Number: [id number] Patient Account Number: [id number] Date of Birth/Sex: Treating RN: 1946-02-11 (77 y.o. 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 M) Primary Care Provider: Clovia Cuff Other Clinician: Referring Provider: Treating Provider/Extender: Milagros Evener in Treatment: 3 Subjective Chief Complaint Information obtained from Patient 04/16/2022; patient is here for a second opinion accompanied by his wife with regards to a sacral pressure ulcer and an area on his right posterior lateral lower leg History of Present Illness (HPI) ADMISSION 04/16/2022 This is a 77 year old man who arrived accompanied by his wife. 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 During this admission he had pulmonary emboli, atrial fibrillation and he is on chronic anticoagulation. He is cared for at home by home care as well as his wife. He has a suprapubic catheter. His wife states that his wound on his sacrum has been there for about a year. They have a doctor via an in-home care service. He ordered a wound VAC on this sometime in  the mid summer in July but they have not had any improvement per his wife although I do not have any documentation on this. Apparently the home health nurses do not think  this is improved either. More recently he has developed an area on his right posterior lower leg. This is likely a pressure area as well. They have been using Santyl He apparently was on antibiotics up until about a week ago but is finished these. His home health agency is [facility]. He had a group 2 mattress but he did not like this so he simply has a gel overlay now. Past medical history includes the after mentioned stroke syndromes, chronic suprapubic catheter, atrial fibrillation, history of PE, autonomic dysreflexia. He is on chronic anticoagulation His ABI on the right was 1.1 05/07/2022: The patient is new to me, having seen Dr. Dellia Nims at his initial visit. The wound on his lateral leg no longer has exposed tendon. There is slough overlying a bit of granulation tissue. According to the patient's wife, there has been no significant change to the sacral ulcer. A Level 3 mattress was ordered and apparently they do not care for it. Patient History Family History Cancer - Mother,Father. Social History Never smoker, Marital Status - Married, Alcohol Use - Never, Drug Use - No History. Medical History Cardiovascular Patient has history of Arrhythmia - A.fib Neurologic Patient has history of Quadriplegia - CVA 2022 MARKE, JAFARI ([id number]) [id number] Page 8 of 11 Hospitalization/Surgery History - sepsis 09/09/2021 pressure ulcer started. - 12/03/2020 CVA. Medical A Surgical History Notes nd Constitutional Symptoms (General Health) CVA 12/03/2020 Respiratory PE Genitourinary neurogenic bladder-foley Objective Constitutional Slightly hypertensive. Slightly bradycardic, asymptomatic. No acute distress. Vitals Time Taken: 10:58 AM, Height: 68 in, Weight: 150 lbs, BMI: 22.8, Temperature: 97.8 F, Pulse: 56 bpm, Respiratory Rate: 18 breaths/min, Blood Pressure: 148/72 mmHg. Respiratory Normal work of breathing on room air. General Notes: 05/07/2022: The wound on  his lateral leg no longer has exposed tendon. There is slough overlying a bed of granulation tissue. According to the patient's wife, there has been no significant change to the sacral ulcer. Integumentary (Hair, Skin) Wound #1 status is Open. Original cause of wound was Pressure Injury. The date acquired was: 09/02/2021. The wound has been in treatment 3 weeks. The wound is located on the Sacrum. The wound measures 3.5cm length x 2.6cm width x 1.7cm depth; 7.147cm^2 area and 12.15cm^3 volume. There is bone and Fat Layer (Subcutaneous Tissue) exposed. There is no tunneling noted, however, there is undermining starting at 12:00 and ending at 10:00 with a maximum distance of 4.6cm. There is a medium amount of serosanguineous drainage noted. The wound margin is well defined and not attached to the wound base. There is large (67-100%) red, pink granulation within the wound bed. There is a small (1-33%) amount of necrotic tissue within the wound bed including Adherent Slough. The periwound skin appearance did not exhibit: Callus, Crepitus, Excoriation, Induration, Rash, Scarring, Dry/Scaly, Maceration, Atrophie Blanche, Cyanosis, Ecchymosis, Hemosiderin Staining, Mottled, Pallor, Rubor, Erythema. Wound #2 status is Open. Original cause of wound was Gradually Appeared. The date acquired was: 11/02/2021. The wound has been in treatment 3 weeks. The wound is located on the Right,Posterior Lower Leg. The wound measures 5.5cm length x 1.1cm width x 0.2cm depth; 4.752cm^2 area and 0.95cm^3 volume. There is tendon and Fat Layer (Subcutaneous Tissue) exposed. There is no tunneling or undermining noted. There is a medium amount of serosanguineous drainage noted. The wound margin is epibole. There is  large (67-100%) red, pink granulation within the wound bed. There is a small (1-33%) amount of necrotic tissue within the wound bed including Adherent Slough. The periwound skin appearance exhibited: Scarring. The periwound  skin appearance did not exhibit: Callus, Crepitus, Excoriation, Induration, Rash, Dry/Scaly, Maceration, Atrophie Blanche, Cyanosis, Ecchymosis, Hemosiderin Staining, Mottled, Pallor, Rubor, Erythema. 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 There was a Excisional Skin/Subcutaneous Tissue/Muscle/Bone Debridement with a total area of 0.25 sq cm performed by Fredirick Maudlin, MD. With the following instrument(s): Rongeur to remove Non-Viable tissue/material. Material removed includes Bone after achieving pain control using Lidocaine 5% topical ointment. 1 specimen was taken by a Tissue Culture and sent to the lab per facility protocol. A time out was conducted at 11:17, prior to the start of the procedure. A Minimum amount of bleeding was controlled with Other Topical Anticoagulant. The procedure was tolerated well. Post Debridement Measurements: 3.5cm length x 2.6cm width x 1.7cm depth; 12.15cm^3 volume. Post debridement Stage noted as Category/Stage IV. Character of Wound/Ulcer Post Debridement requires further debridement. Post procedure Diagnosis Wound #1: Same as Pre-Procedure Pre-procedure diagnosis of Wound #1 is a Pressure Ulcer located on the Sacrum . There was a biopsy performed by Fredirick Maudlin, MD. There was a biopsy performed on bone. The skin was cleansed and prepped with anti-septic. Tissue was removed at its base with the following instrument(s): Rongeur and sent to pathology. A Moderate amount of bleeding was controlled with Other T opical Anticoagulant. A time out was conducted at 11:20, prior to the start of the procedure. The procedure was tolerated well with a pain level of 0 throughout and a pain level of 0 following the  procedure. Post procedure Diagnosis Wound #1: Same as Pre-Procedure [name], [name] ([id number]) [id number].pdf Page 9 of 11 General Notes: scribed for Dr. Celine Ahr by Adline Peals, RN. Wound #2 Pre-procedure diagnosis of Wound #2 is a Pressure Ulcer located on the Right,Posterior Lower Leg . There was a Selective/Open Wound Non-Viable Tissue Debridement with a total area of 6.05 sq cm performed by Fredirick Maudlin, MD. With the following instrument(s): Curette to remove Non-Viable tissue/material. Material removed includes [facility]. No specimens were taken. A time out was conducted at 11:17, prior to the start of the procedure. A Minimum amount of bleeding was controlled with Pressure. The procedure was tolerated well. Post Debridement Measurements: 5.5cm length x 1.1cm width x 0.2cm depth; 0.95cm^3 volume. Post debridement Stage noted as Category/Stage IV. Character of Wound/Ulcer Post Debridement is improved. Post procedure Diagnosis Wound #2: Same as Pre-Procedure General Notes: scribed for Dr. Celine Ahr by Adline Peals, RN. Plan Follow-up Appointments: Return Appointment in 2 weeks. - Dr. Celine Ahr Room 1 ***stretcher**** need extra time. Bathing/ Shower/ Hygiene: May shower and wash wound with soap and water. Off-Loading: Air fluidized (Group 3) mattress - will order through medical modalities for a group 3. Turn and reposition every 2 hours Other: - minimal time up in the chair. Only for meals and back to bed. Additional Orders / Instructions: Follow Nutritious Diet - increase protein to aid in wound healing. Home Health: No change in wound care orders this week; continue Home Health for wound care. May utilize formulary equivalent dressing for wound treatment orders unless otherwise specified. Other Home Health Orders/Instructions: - [facility] Laboratory ordered  were: Biopsy specimen culture - Bone biopsy of nonhealing wound to sacrum, Anaerobic  culture - PCR of nonhealing wound to sacrum Consults ordered were: Infectious Disease - 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 and positive culture (results attached) WOUND #1: - Sacrum Wound Laterality: Cleanser: Wound Cleanser (Home Health) 1 x Per Day/30 Days Discharge Instructions: Cleanse the wound with wound cleanser prior to applying a clean dressing using gauze sponges, not tissue or cotton balls. Peri-Wound Care: Skin Prep (Home Health) 1 x Per Day/30 Days Discharge Instructions: Use skin prep as directed Prim Dressing: Dakin's Solution 0.25%, 16 (oz) (Home Health) 1 x Per Day/30 Days ary Discharge Instructions: Moisten gauze with Dakin's solution Secondary Dressing: Zetuvit Plus Silicone Border Sacrum Dressing, Sm, 7x7 (in/in) (Home Health) 1 x Per Day/30 Days Discharge Instructions: Apply silicone border OR ABD PAD. Secured With: [employer], 4.5x3.1 (in/yd) 1 x Per Day/30 Days Discharge Instructions: Secure with Kerlix as directed. WOUND #2: - Lower Leg Wound Laterality: Right, Posterior Cleanser: Wound Cleanser (Home Health) 3 x Per Week/30 Days Discharge Instructions: Cleanse the wound with wound cleanser prior to applying a clean dressing using gauze sponges, not tissue or cotton balls. Peri-Wound Care: Sween Lotion (Moisturizing lotion) (Home Health) 3 x Per Week/30 Days Discharge Instructions: Apply moisturizing lotion as directed Prim Dressing: Promogran Prisma Matrix, 4.34 (sq in) (silver collagen) (Home Health) 3 x Per Week/30 Days ary Discharge Instructions: Moisten collagen with saline or hydrogel Secondary Dressing: ABD Pad, 5x9 (Home Health) 3 x Per Week/30 Days Discharge Instructions: Apply over primary dressing as directed. Com pression Wrap: Kerlix Roll 4.5x3.1 (in/yd) 3 x Per Week/30 Days Discharge Instructions: Apply Kerlix and Coban compression as directed. Com pression Wrap: Coban Self-Adherent Wrap 4x5 (in/yd) 3 x Per  Week/30 Days Discharge Instructions: Apply over Kerlix as directed. 05/07/2022: The wound on his lateral leg no longer has exposed tendon. There is slough overlying a bed of granulation tissue. According to the patient's wife, there has been no significant change to the sacral ulcer. I used a curette to debride slough off of the lateral leg wound. We will continue Prisma silver collagen here. I took a bone biopsy as well as a bone culture, using a rongeur, of the exposed coccyx. For now, we will continue to pack the wound with Dakin's moistened Kerlix. Once biopsy and culture data return, he may need to see infectious disease for a course of IV antibiotics. Follow-up in 2 weeks. Electronic Signature(s) Signed: 05/12/2022 8:09:50 AM By: Fredirick Maudlin MD FACS Previous Signature: 05/09/2022 8:12:23 AM Version By: Fredirick Maudlin MD FACS Previous Signature: 05/07/2022 12:36:33 PM Version By: Fredirick Maudlin MD FACS Entered By: Fredirick Maudlin on 05/12/2022 08:09:50 Rande Brunt ([id number] Page 10 of 11 -------------------------------------------------------------------------------- HxROS Details Patient Name: Date of Service: [name], [name] 05/07/2022 10:45 A M Medical Record Number: [id number] Patient Account Number: [id number] Date of Birth/Sex: Treating RN: May 22, 1945 (77 y.o. 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 Respiratory Medical History: Past Medical History Notes: PE Cardiovascular Medical History: Positive for: Arrhythmia - A.fib Genitourinary Medical History: Past Medical History Notes: neurogenic bladder-foley Neurologic Medical History: Positive for: Quadriplegia - CVA 2022 Immunizations Pneumococcal  Vaccine: Received Pneumococcal Vaccination: Yes Received Pneumococcal Vaccination On or After 60th Birthday: Yes Implantable Devices No devices added Hospitalization / Surgery History Type of Hospitalization/Surgery sepsis  09/09/2021 pressure ulcer started 12/03/2020 CVA Family and Social History Cancer: Yes - Mother,Father; Never smoker; Marital Status - Married; Alcohol Use: Never; Drug Use: No History; Financial Concerns: No; Food, Clothing or Shelter Needs: No; Support System Lacking: No; Transportation Concerns: No Electronic Signature(s) Signed: 05/07/2022 12:38:13 PM By: Fredirick Maudlin MD FACS Entered By: Fredirick Maudlin on 05/07/2022 12:27:07 -------------------------------------------------------------------------------- SuperBill Details Patient Name: Date of Service: [name], [name] 05/07/2022 Medical Record Number: [id number] Patient Account Number: [id number] Date of Birth/Sex: Treating RN: 06-28-1945 (77 y.o. 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 with other speci Modifier: fied severity Quantity: 1 : CPT4 Code: [id number] Description: [id number] - DEB BONE 20 SQ CM/< ICD-10 Diagnosis Description L89.154 Pressure ulcer of sacral region, stage 4 Modifier: Quantity: 1 Physician Procedures : CPT4: Description Modifier Code [id number] - WC PHYS LEVEL 4 -  EST PT 25 ICD-10 Diagnosis Description L89.154 Pressure ulcer of sacral region, stage 4 L97.918 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 with other specified  severity 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, complete Quantity: 1 : CPT4: [id number] - WC PHYS DEBR WO ANESTH 20 SQ CM ICD-10 Diagnosis Description L97.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

## 2022-05-19 ENCOUNTER — Ambulatory Visit (INDEPENDENT_AMBULATORY_CARE_PROVIDER_SITE_OTHER): Payer: Medicare Other

## 2022-05-19 DIAGNOSIS — I6340 Cerebral infarction due to embolism of unspecified cerebral artery: Secondary | ICD-10-CM

## 2022-05-19 NOTE — Progress Notes (Signed)
[facility] Summary Report / Loop Recorder

## 2022-05-20 ENCOUNTER — Telehealth: Payer: Self-pay

## 2022-05-20 ENCOUNTER — Other Ambulatory Visit: Payer: Self-pay

## 2022-05-20 ENCOUNTER — Encounter: Payer: Self-pay | Admitting: Internal Medicine

## 2022-05-20 ENCOUNTER — Ambulatory Visit (INDEPENDENT_AMBULATORY_CARE_PROVIDER_SITE_OTHER): Payer: Medicare Other | Admitting: Internal Medicine

## 2022-05-20 VITALS — BP 107/67 | HR 62 | Temp 98.0°F

## 2022-05-20 DIAGNOSIS — M4628 Osteomyelitis of vertebra, sacral and sacrococcygeal region: Secondary | ICD-10-CM

## 2022-05-20 LAB — CBC WITH DIFFERENTIAL/PLATELET
Absolute Monocytes: 570 cells/uL (ref 200–950)
Basophils Absolute: 47 cells/uL (ref 0–200)
Basophils Relative: 0.7 %
Eosinophils Absolute: 141 cells/uL (ref 15–500)
Eosinophils Relative: 2.1 %
HCT: 35.2 % — ABNORMAL LOW (ref 38.5–50.0)
Hemoglobin: 11.6 g/dL — ABNORMAL LOW (ref 13.2–17.1)
Lymphs Abs: 824 cells/uL — ABNORMAL LOW (ref 850–3900)
MCH: 29.1 pg (ref 27.0–33.0)
MCHC: 33 g/dL (ref 32.0–36.0)
MCV: 88.4 fL (ref 80.0–100.0)
MPV: 11 fL (ref 7.5–12.5)
Monocytes Relative: 8.5 %
Neutro Abs: 5119 cells/uL (ref 1500–7800)
Neutrophils Relative %: 76.4 %
Platelets: 244 10*3/uL (ref 140–400)
RBC: 3.98 10*6/uL — ABNORMAL LOW (ref 4.20–5.80)
RDW: 14.5 % (ref 11.0–15.0)
Total Lymphocyte: 12.3 %
WBC: 6.7 10*3/uL (ref 3.8–10.8)

## 2022-05-20 LAB — COMPREHENSIVE METABOLIC PANEL
AG Ratio: 1.3 (calc) (ref 1.0–2.5)
ALT: 8 U/L — ABNORMAL LOW (ref 9–46)
AST: 11 U/L (ref 10–35)
Albumin: 3.6 g/dL (ref 3.6–5.1)
Alkaline phosphatase (APISO): 104 U/L (ref 35–144)
BUN/Creatinine Ratio: 60 (calc) — ABNORMAL HIGH (ref 6–22)
BUN: 18 mg/dL (ref 7–25)
CO2: 26 mmol/L (ref 20–32)
Calcium: 9.2 mg/dL (ref 8.6–10.3)
Chloride: 106 mmol/L (ref 98–110)
Creat: 0.3 mg/dL — ABNORMAL LOW (ref 0.70–1.28)
Globulin: 2.8 g/dL (calc) (ref 1.9–3.7)
Glucose, Bld: 98 mg/dL (ref 65–99)
Potassium: 4.3 mmol/L (ref 3.5–5.3)
Sodium: 138 mmol/L (ref 135–146)
Total Bilirubin: 0.5 mg/dL (ref 0.2–1.2)
Total Protein: 6.4 g/dL (ref 6.1–8.1)

## 2022-05-20 LAB — CUP PACEART REMOTE DEVICE CHECK
Date Time Interrogation Session: 20240112231324
Implantable Pulse Generator Implant Date: 20220811

## 2022-05-20 MED ORDER — LINEZOLID 600 MG PO TABS: 600.0000 mg | ORAL_TABLET | Freq: Two times a day (BID) | ORAL | 0 refills | Status: DC

## 2022-05-20 NOTE — Telephone Encounter (Signed)
Per MD patient needs labs every 2 weeks for 6 weeks. Spoke to [place] at [employer] [phone]) and faxed order to [phone]

## 2022-05-20 NOTE — Progress Notes (Addendum)
[facility] for [facility]ious [facility]      Reason for Consult: sacral osteomyelitis    Referring Physician: Dr. Celine Ahr    Patient ID: [name], male    DOB: 1946/04/29, 77 y.o.   MRN: [id number]  HPI:   [name] is here for evaluation of osteomyelitis of the sacral region.  He has a history of a CVA and is now bedbound and developed a sacral wound stemming from that time.  He is here with his wife who gives most of the history.  He is followed by wound care, Dr. Celine Ahr and had a bone biopsy positive for Enterococcus and Finegoldia.  He has not started any antibiotics.  His wound has been poorly healing overall.  He does have some soiling with stool and urine but is not a good candidate for a diverting colostomy.  The wife has been trying to get the appropriate bed for offloading but has not been able to get one through medicare.  He is getting protein supplementation daily.     Past Medical History:  Diagnosis Date   CVA (cerebral vascular accident) [facility])     Prior to Admission medications   Medication Sig Start Date End Date Taking? Authorizing Provider  acetaminophen (TYLENOL) 325 MG tablet Take 2 tablets (650 mg total) by mouth every 4 (four) hours as needed for mild pain (or temp > 37.5 C (99.5 F)). 01/09/21  Yes Angiulli, Lavon Paganini, PA-C  Ascorbic Acid (VITAMIN C PO) Take 1 tablet by mouth daily.   Yes [provider]  Cholecalciferol (VITAMIN D3 PO) Take 1 tablet by mouth daily.   Yes [provider]  linezolid (ZYVOX) 600 MG tablet Take 1 tablet (600 mg total) by mouth 2 (two) times daily. 05/20/22  Yes Preesha Benjamin, Okey Regal, MD  rivaroxaban (XARELTO) 20 MG TABS tablet Take 1 tablet (20 mg total) by mouth daily with supper. 06/03/21  Yes [name], MD  zinc gluconate 50 MG tablet Take 50 mg by mouth daily.   Yes [provider]  Baclofen 5 MG TABS Take 5 mg by mouth 3 (three) times daily. Patient not taking: Reported on 05/05/2021 03/27/21   Marcial Pacas, MD  bisacodyl (DULCOLAX) 10 MG suppository Place 1 suppository (10 mg total) rectally daily at 8 pm. Patient not taking: Reported on 05/20/2022 01/09/21   Angiulli, Lavon Paganini, PA-C  diphenhydrAMINE-APAP, sleep, (TYLENOL PM EXTRA STRENGTH PO) Take 2 tablets by mouth daily as needed (pain). Patient not taking: Reported on 05/20/2022    [provider]  ELDERBERRY PO Take 1 capsule by mouth daily. Patient not taking: Reported on 05/20/2022    [provider]  fludrocortisone (FLORINEF) 0.1 MG tablet Take 1 tablet (0.1 mg total) by mouth daily. For orthostatic hypotension/low BP Patient not taking: Reported on 05/20/2022 03/06/21   Lovorn, Jinny Blossom, MD  lidocaine (LIDODERM) 5 % Place 3 patches onto the skin daily. Remove & Discard patch within 12 hours or as directed by MD Patient not taking: Reported on 05/05/2021 01/09/21   Angiulli, Lavon Paganini, PA-C  liver oil-zinc oxide (DESITIN) 40 % ointment Apply 1 application. topically daily. Patient not taking: Reported on 05/20/2022    [provider]  Misc Natural Product ([place] #1 OP) Place 1 drop into both eyes daily as needed (dry eyes). Patient not taking: Reported on 05/20/2022    [provider]  pantoprazole (PROTONIX) 40 MG tablet Take 1 tablet (40 mg total) by mouth daily.  Patient not taking: Reported on 09/09/2021 01/09/21 02/08/21  Angiulli, Lavon Paganini, PA-C  sodium chloride (OCEAN) 0.65 % nasal spray Place 1 spray into the nose daily as needed for congestion. Patient not taking: Reported on 05/20/2022    [provider]    No Known Allergies  Social History   Tobacco Use   Smoking status: Never   Smokeless tobacco: Never  Vaping Use   Vaping Use: Never used  Substance Use Topics   Alcohol use: Not Currently   Drug use: Never    Family History  Problem Relation Age of Onset   Cancer Mother    Dementia Mother    Heart attack Father    Clotting disorder Father     Review of Systems   Constitutional: negative for fevers and chills All other systems reviewed and are negative    Constitutional: in no apparent distress  Vitals:   05/20/22 0928  BP: 107/67  Pulse: 62  Temp: 98 F (36.7 C)  SpO2: 97%   EYES: anicteric ENMT: no thrush Respiratory: normal respiratory effort Musculoskeletal: contracted upper extremities bilateral Skin: no rash  Labs: Lab Results  Component Value Date   WBC 13.7 (H) 09/12/2021   HGB 11.0 (L) 09/12/2021   HCT 31.8 (L) 09/12/2021   MCV 91.1 09/12/2021   PLT 212 09/12/2021    Lab Results  Component Value Date   CREATININE 0.35 (L) 09/12/2021   BUN 6 (L) 09/12/2021   NA 139 09/12/2021   K 3.3 (L) 09/12/2021   CL 110 09/12/2021   CO2 23 09/12/2021    Lab Results  Component Value Date   ALT 12 09/09/2021   AST 16 09/09/2021   ALKPHOS 61 09/09/2021   BILITOT 0.7 09/09/2021   INR 1.1 09/09/2021     Assessment: chronic sacral osteomyelitis - he has an open wound probing to bone, a positive bone biopsy with bacterial growth and bedbound c/w osteomyelitis.  I discussed the necessary elements to healing osteomyelitis associated with a sacral ulcer including offloading, nutrition support and avoidance of soiling.  He is not a great candidate for a diverting colostomy but otherwise has a reasonable chance of healing. Therefore will have him start linezolid based on cultures from the bone biopsy (VRE and Finegoldia) and follow.  Plan: 1)  linezolid 600 mg twice a day for 6 weeks 2) cbc and CMP today and every 2 weeks via home health 3) follow up in 4-5 weeks via video visit to assess progress  Having concerns with linezolid so will start daptomycin 8mg /kg IV x 42 days  Diagnosis: Sacral osteomyelitis  Culture Result: VRE and finegoldia  No Known Allergies  OPAT Orders Discharge antibiotics to be given via PICC line Discharge antibiotics: daptomycin 8 mg/kg IV daily Per pharmacy protocol yes Duration: 42 days  [facility]  Per Protocol: yes  Home health RN for IV administration and teaching; PICC line care and labs.    Labs weekly while on IV antibiotics: _x_ CBC with differential __ BMP _x_ CMP _x_ CRP _x_ ESR __ Vancomycin trough _x_ CK  _x_ Please pull PIC at completion of IV antibiotics __ Please leave PIC in place until doctor has seen patient or been notified  Fax weekly labs to [phone]

## 2022-05-22 ENCOUNTER — Telehealth: Payer: Self-pay

## 2022-05-22 ENCOUNTER — Ambulatory Visit (HOSPITAL_BASED_OUTPATIENT_CLINIC_OR_DEPARTMENT_OTHER): Payer: Medicare Other | Admitting: General Surgery

## 2022-05-22 NOTE — Telephone Encounter (Signed)
Received call from patient's occupational therapist, [name] with [facility]. She reports that [name] just took his first dose of linezolid about 40 minutes ago and is now experiencing shortness of breath.   [name] denies any tongue or lip swelling or tightening of his airway. OT was able to get an SpO2 of 97% on room air with pulse of 77.   Spoke with patient's wife, advised her to stop patient's antibiotic until Dr. Linus Pacheco can review note and make further decision.   She states she is concerned and can tell that something is wrong, he is breathing shallowly. Encouraged her to call 911 as he is paraplegic and she is not able to transport him to the hospital herself. She verbalizes agreement with this plan. Will route to provider.   Beryle Flock, RN

## 2022-05-23 NOTE — Telephone Encounter (Signed)
Spoke with [name], she would like to try the PICC line and IV antibiotics. Will coordinate with IR and [facility] on Monday.   [name] has their home health nurse, Hinton Dyer from [facility] there and reports that [name] has been "bleeding profusely" through the dressings on his sacrum and right leg. Advised her that if the bleeding is not able to be controlled that [place] will need to go to the ED. She is in agreement with this.   She now says that they changed the dressing an hour ago and that there has not been any blood seeping through.   Discussed with Dr. Linus Salmons, agrees with ED evaluation if bleeding does not stop.   Beryle Flock, RN

## 2022-05-23 NOTE — Addendum Note (Signed)
Addended by: Thayer Headings on: 05/23/2022 12:18 PM   Modules accepted: Orders

## 2022-05-23 NOTE — Telephone Encounter (Signed)
Spoke with patient's wife, [name]. She says that [name] is much better and that the "crisis is over."  She did not end up taking him to the ED yesterday as he got better on his own.   We discussed that Dr. Linus Salmons does not think his shortness of breath was caused by the linezolid. [name] can agree with this, but says that [name] has a "nervous component" and gets anxious. She is afraid that if she tries to give him the linezolid again he will have a similar reaction.  Reports he has tolerated other antibiotics well in the past and this was not a normal reaction for him. She does not want to risk giving him another dose at this time. Will notify provider.   Beryle Flock, RN

## 2022-05-26 ENCOUNTER — Ambulatory Visit (HOSPITAL_BASED_OUTPATIENT_CLINIC_OR_DEPARTMENT_OTHER): Payer: Medicare Other | Admitting: General Surgery

## 2022-05-26 ENCOUNTER — Telehealth: Payer: Self-pay

## 2022-05-26 NOTE — Telephone Encounter (Signed)
New OPAT orders per Dr. Linus Salmons, orders shared with Carolynn Sayers, RN at [facility] and [facility] pharmacy staff.   IR appointment: 1/30 at 9:45 AM - appointment time and location provided to both patient and [facility].   First dose: Short stay, [facility] aware. Will fax orders to [facility] once insurance approves.   Beryle Flock, RN

## 2022-05-26 NOTE — Telephone Encounter (Signed)
IR appointment time changed to 10:45, notified [name] and Carolynn Sayers.   Beryle Flock, RN

## 2022-05-28 ENCOUNTER — Other Ambulatory Visit: Payer: Self-pay

## 2022-05-28 ENCOUNTER — Encounter (HOSPITAL_COMMUNITY): Payer: Self-pay

## 2022-05-28 ENCOUNTER — Inpatient Hospital Stay (HOSPITAL_COMMUNITY)
Admission: EM | Admit: 2022-05-28 | Discharge: 2022-05-30 | DRG: 539 | Disposition: A | Discharge status: Home Health | Payer: Medicare Other | Attending: Family Medicine | Admitting: Family Medicine

## 2022-05-28 ENCOUNTER — Emergency Department: Payer: Self-pay

## 2022-05-28 ENCOUNTER — Emergency Department (HOSPITAL_COMMUNITY): Payer: Medicare Other

## 2022-05-28 DIAGNOSIS — Z7401 Bed confinement status: Secondary | ICD-10-CM

## 2022-05-28 DIAGNOSIS — Z66 Do not resuscitate: Secondary | ICD-10-CM | POA: Diagnosis present

## 2022-05-28 DIAGNOSIS — I69365 Other paralytic syndrome following cerebral infarction, bilateral: Secondary | ICD-10-CM

## 2022-05-28 DIAGNOSIS — M869 Osteomyelitis, unspecified: Secondary | ICD-10-CM | POA: Diagnosis not present

## 2022-05-28 DIAGNOSIS — I2699 Other pulmonary embolism without acute cor pulmonale: Secondary | ICD-10-CM

## 2022-05-28 DIAGNOSIS — Z8249 Family history of ischemic heart disease and other diseases of the circulatory system: Secondary | ICD-10-CM

## 2022-05-28 DIAGNOSIS — M4628 Osteomyelitis of vertebra, sacral and sacrococcygeal region: Principal | ICD-10-CM | POA: Diagnosis present

## 2022-05-28 DIAGNOSIS — Z7901 Long term (current) use of anticoagulants: Secondary | ICD-10-CM

## 2022-05-28 DIAGNOSIS — G8250 Quadriplegia, unspecified: Secondary | ICD-10-CM | POA: Diagnosis present

## 2022-05-28 DIAGNOSIS — Z1152 Encounter for screening for COVID-19: Secondary | ICD-10-CM

## 2022-05-28 DIAGNOSIS — L89154 Pressure ulcer of sacral region, stage 4: Secondary | ICD-10-CM | POA: Diagnosis present

## 2022-05-28 DIAGNOSIS — R0602 Shortness of breath: Secondary | ICD-10-CM | POA: Diagnosis present

## 2022-05-28 DIAGNOSIS — Z86711 Personal history of pulmonary embolism: Secondary | ICD-10-CM

## 2022-05-28 DIAGNOSIS — Z79899 Other long term (current) drug therapy: Secondary | ICD-10-CM

## 2022-05-28 LAB — CBC WITH DIFFERENTIAL/PLATELET
Abs Immature Granulocytes: 0.02 K/uL (ref 0.00–0.07)
Basophils Absolute: 0 K/uL (ref 0.0–0.1)
Basophils Relative: 1 %
Eosinophils Absolute: 0.1 K/uL (ref 0.0–0.5)
Eosinophils Relative: 1 %
HCT: 36.4 % — ABNORMAL LOW (ref 39.0–52.0)
Hemoglobin: 12.1 g/dL — ABNORMAL LOW (ref 13.0–17.0)
Immature Granulocytes: 0 %
Lymphocytes Relative: 14 %
Lymphs Abs: 0.7 K/uL (ref 0.7–4.0)
MCH: 29.2 pg (ref 26.0–34.0)
MCHC: 33.2 g/dL (ref 30.0–36.0)
MCV: 87.9 fL (ref 80.0–100.0)
Monocytes Absolute: 0.5 K/uL (ref 0.1–1.0)
Monocytes Relative: 10 %
Neutro Abs: 3.4 K/uL (ref 1.7–7.7)
Neutrophils Relative %: 74 %
Platelets: 234 K/uL (ref 150–400)
RBC: 4.14 MIL/uL — ABNORMAL LOW (ref 4.22–5.81)
RDW: 15.6 % — ABNORMAL HIGH (ref 11.5–15.5)
WBC: 4.7 K/uL (ref 4.0–10.5)
nRBC: 0 % (ref 0.0–0.2)

## 2022-05-28 LAB — URINALYSIS, ROUTINE W REFLEX MICROSCOPIC
Bilirubin Urine: NEGATIVE
Glucose, UA: NEGATIVE mg/dL
Ketones, ur: NEGATIVE mg/dL
Nitrite: POSITIVE — AB
Protein, ur: 30 mg/dL — AB
RBC / HPF: >50 RBC/hpf — ABNORMAL HIGH (ref 0–5)
Specific Gravity, Urine: 1.024 (ref 1.005–1.030)
pH: 8 (ref 5.0–8.0)

## 2022-05-28 LAB — TROPONIN I (HIGH SENSITIVITY)
Troponin I (High Sensitivity): 5 ng/L
Troponin I (High Sensitivity): 5 ng/L

## 2022-05-28 LAB — CK: Total CK: 60 U/L (ref 49–397)

## 2022-05-28 LAB — RESP PANEL BY RT-PCR (RSV, FLU A&B, COVID)  RVPGX2
Influenza A by PCR: NEGATIVE
Influenza B by PCR: NEGATIVE
Resp Syncytial Virus by PCR: NEGATIVE
SARS Coronavirus 2 by RT PCR: NEGATIVE

## 2022-05-28 LAB — COMPREHENSIVE METABOLIC PANEL WITH GFR
ALT: 11 U/L (ref 0–44)
AST: 16 U/L (ref 15–41)
Albumin: 3.2 g/dL — ABNORMAL LOW (ref 3.5–5.0)
Alkaline Phosphatase: 98 U/L (ref 38–126)
Anion gap: 8 (ref 5–15)
BUN: 14 mg/dL (ref 8–23)
CO2: 23 mmol/L (ref 22–32)
Calcium: 9.3 mg/dL (ref 8.9–10.3)
Chloride: 105 mmol/L (ref 98–111)
Creatinine, Ser: 0.34 mg/dL — ABNORMAL LOW (ref 0.61–1.24)
GFR, Estimated: >60 mL/min
Glucose, Bld: 110 mg/dL — ABNORMAL HIGH (ref 70–99)
Potassium: 3.9 mmol/L (ref 3.5–5.1)
Sodium: 136 mmol/L (ref 135–145)
Total Bilirubin: 1.1 mg/dL (ref 0.3–1.2)
Total Protein: 6.5 g/dL (ref 6.5–8.1)

## 2022-05-28 LAB — LACTIC ACID, PLASMA
Lactic Acid, Venous: 0.8 mmol/L (ref 0.5–1.9)
Lactic Acid, Venous: 1.1 mmol/L (ref 0.5–1.9)

## 2022-05-28 LAB — BRAIN NATRIURETIC PEPTIDE: B Natriuretic Peptide: 30.2 pg/mL (ref 0.0–100.0)

## 2022-05-28 MED ORDER — POLYETHYLENE GLYCOL 3350 17 G PO PACK: 17.0000 g | PACK | Freq: Every day | ORAL | Status: DC | PRN

## 2022-05-28 MED ORDER — ACETAMINOPHEN 650 MG RE SUPP: 650.0000 mg | Freq: Four times a day (QID) | RECTAL | Status: DC | PRN

## 2022-05-28 MED ORDER — ACETAMINOPHEN 325 MG PO TABS: 650.0000 mg | ORAL_TABLET | Freq: Four times a day (QID) | ORAL | Status: DC | PRN

## 2022-05-28 MED ORDER — SODIUM CHLORIDE 0.9 % IV SOLN
700.0000 mg | Freq: Every day | INTRAVENOUS | Status: DC
  Administered 2022-05-28: 700 mg via INTRAVENOUS
  Filled 2022-05-28 (×2): qty 14

## 2022-05-28 MED ORDER — RIVAROXABAN 10 MG PO TABS
20.0000 mg | ORAL_TABLET | Freq: Every day | ORAL | Status: DC
  Administered 2022-05-28: 20 mg via ORAL
  Filled 2022-05-28: qty 2

## 2022-05-28 MED ORDER — SALINE SPRAY 0.65 % NA SOLN
1.0000 | NASAL | Status: DC | PRN
  Administered 2022-05-28 – 2022-05-29 (×2): 1 via NASAL
  Filled 2022-05-28: qty 44

## 2022-05-28 MED ORDER — ONDANSETRON HCL 4 MG PO TABS: 4.0000 mg | ORAL_TABLET | Freq: Four times a day (QID) | ORAL | Status: DC | PRN

## 2022-05-28 MED ORDER — ONDANSETRON HCL 4 MG/2ML IJ SOLN: 4.0000 mg | Freq: Four times a day (QID) | INTRAMUSCULAR | Status: DC | PRN

## 2022-05-28 MED ORDER — SODIUM CHLORIDE 0.9 % IV BOLUS
1000.0000 mL | Freq: Once | INTRAVENOUS | Status: AC
  Administered 2022-05-28: 1000 mL via INTRAVENOUS

## 2022-05-28 NOTE — ED Triage Notes (Signed)
Shortness of breath and weakness. Per EMS patient has new onset A. Flutter on EKG. Recent diagnosis of bone infection 1 month ago and has untreatable pressure sore. Started on antibiotic and had an allergic reaction to this. Scheduled to place PICC line in the future. Hx of UTIs. Has chronic foley catheter.

## 2022-05-28 NOTE — ED Notes (Signed)
Foley catheter exchanged for new catheter

## 2022-05-28 NOTE — H&P (Signed)
History and Physical    [name] OEH:[id number] DOB: November 22, 1945 DOA: 05/28/2022  PCP: Clovia Cuff, MD  Patient coming from: home  I have personally briefly reviewed patient's old medical records in [place]  Chief Complaint: shortness of breath  HPI: [name] is [name] 77 y.o. 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  Wife notes chronic SOB often positionally dependent since his stroke.  Usually lasts around 30 minutes or so.  This morning was longer so he was sent to hospital.  No other symptoms.  No chest pain.  Symptoms have resolved at this time.  They'd planned on PICC and IV antibiotics for osteo and have requested this be done while he's here given complexity associated with transporting him with his quadriplegia.  ED Course: Labs, imaging, ID c/s.  Request for admission for PICC.  Review of Systems: As per HPI otherwise all other systems reviewed and are negative.  Past Medical History:  Diagnosis Date   CVA (cerebral vascular accident) [facility])     Past Surgical History:  Procedure Laterality Date   LOOP RECORDER INSERTION N/[name] 12/13/2020   Procedure: LOOP RECORDER INSERTION;  Surgeon: [name], MD;  Location: [place] [facility];  Service: Cardiovascular;  Laterality: N/Azeneth Carbonell;   TONSILLECTOMY      Social History  reports that he has never smoked. He has never used smokeless tobacco. He reports that he does not currently use alcohol. He reports that he does not use drugs.  No Known Allergies  Family History  Problem Relation Age of Onset   Cancer Mother    Dementia Mother    Heart attack Father    Clotting disorder Father    Prior to Admission medications   Medication Sig Start Date End Date Taking? Authorizing Provider  acetaminophen (TYLENOL) 325 MG tablet Take 2  tablets (650 mg total) by mouth every 4 (four) hours as needed for mild pain (or temp > 37.5 C (99.5 F)). 01/09/21   Angiulli, Lavon Paganini, PA-C  Ascorbic Acid (VITAMIN C PO) Take 1 tablet by mouth daily.    [provider]  Baclofen 5 MG TABS Take 5 mg by mouth 3 (three) times daily. Patient not taking: Reported on 05/05/2021 03/27/21   Marcial Pacas, MD  bisacodyl (DULCOLAX) 10 MG suppository Place 1 suppository (10 mg total) rectally daily at 8 pm. Patient not taking: Reported on 05/20/2022 01/09/21   Angiulli, Lavon Paganini, PA-C  Cholecalciferol (VITAMIN D3 PO) Take 1 tablet by mouth daily.    [provider]  diphenhydrAMINE-APAP, sleep, (TYLENOL PM EXTRA STRENGTH PO) Take 2 tablets by mouth daily as needed (pain). Patient not taking: Reported on 05/20/2022    [provider]  ELDERBERRY PO Take 1 capsule by mouth daily. Patient not taking: Reported on 05/20/2022    [provider]  fludrocortisone (FLORINEF) 0.1 MG tablet Take 1 tablet (0.1 mg total) by mouth daily. For orthostatic hypotension/low BP Patient not taking: Reported on 05/20/2022 03/06/21   Lovorn, Jinny Blossom, MD  lidocaine (LIDODERM) 5 % Place 3 patches onto the skin daily. Remove & Discard patch within 12 hours or as directed by MD Patient not taking: Reported on 05/05/2021 01/09/21   Angiulli, Lavon Paganini, PA-C  linezolid (ZYVOX) 600 MG tablet Take 1 tablet (600 mg total) by mouth 2 (two) times daily. 05/20/22  Gardiner Barefoot, MD  liver oil-zinc oxide (DESITIN) 40 % ointment Apply 1 application. topically daily. Patient not taking: Reported on 05/20/2022    [provider]  Misc Natural Product [facility] EYE DROPS #1 OP) Place 1 drop into both eyes daily as needed (dry eyes). Patient not taking: Reported on 05/20/2022    [provider]  pantoprazole (PROTONIX) 40 MG tablet Take 1 tablet (40 mg total) by mouth daily. Patient not taking: Reported on 09/09/2021 01/09/21 02/08/21  Angiulli, Mcarthur Rossetti, PA-C   rivaroxaban (XARELTO) 20 MG TABS tablet Take 1 tablet (20 mg total) by mouth daily with supper. 06/03/21   Lanier Prude, MD  sodium chloride (OCEAN) 0.65 % nasal spray Place 1 spray into the nose daily as needed for congestion. Patient not taking: Reported on 05/20/2022    [provider]  zinc gluconate 50 MG tablet Take 50 mg by mouth daily.    [provider]    Physical Exam: Vitals:   05/28/22 1333 05/28/22 1530 05/28/22 1715  BP: (!) 148/80 129/66 126/65  Pulse: 71 (!) 56 64  Resp: 18 (!) 8 (!) 8  Temp: 97.8 F (36.6 C)  98 F (36.7 C)  TempSrc: Oral    SpO2: 98% 98% 98%    Constitutional: NAD, calm, comfortable Vitals:   05/28/22 1333 05/28/22 1530 05/28/22 1715  BP: (!) 148/80 129/66 126/65  Pulse: 71 (!) 56 64  Resp: 18 (!) 8 (!) 8  Temp: 97.8 F (36.6 C)  98 F (36.7 C)  TempSrc: Oral    SpO2: 98% 98% 98%   Eyes: PERRL ENMT: Mucous membranes are moist.  Neck: normal, supple Respiratory: unlabored Cardiovascular: RRR Abdomen: distended, nontender Musculoskeletal: contractures noted  Skin: decubitus exam deferred Neurologic: quadriplegic, L>R sided weakness, chronic contractures Psychiatric: Normal judgment and insight. Alert. Normal mood.   Labs on Admission: I have personally reviewed following labs and imaging studies  CBC: Recent Labs  Lab 05/28/22 1346  WBC 4.7  NEUTROABS 3.4  HGB 12.1*  HCT 36.4*  MCV 87.9  PLT 234    Basic Metabolic Panel: Recent Labs  Lab 05/28/22 1346  NA 136  K 3.9  CL 105  CO2 23  GLUCOSE 110*  BUN 14  CREATININE 0.34*  CALCIUM 9.3    GFR: CrCl cannot be calculated (Unknown ideal weight.).  Liver Function Tests: Recent Labs  Lab 05/28/22 1346  AST 16  ALT 11  ALKPHOS 98  BILITOT 1.1  PROT 6.5  ALBUMIN 3.2*    Urine analysis:    Component Value Date/Time   COLORURINE AMBER (Elizzie Westergard) 05/28/2022 1529   APPEARANCEUR CLOUDY (Mikiala Fugett) 05/28/2022 1529   LABSPEC 1.024 05/28/2022 1529    PHURINE 8.0 05/28/2022 1529   GLUCOSEU NEGATIVE 05/28/2022 1529   HGBUR SMALL (Deontaye Civello) 05/28/2022 1529   BILIRUBINUR NEGATIVE 05/28/2022 1529   KETONESUR NEGATIVE 05/28/2022 1529   PROTEINUR 30 (Osmani Kersten) 05/28/2022 1529   NITRITE POSITIVE (Gennavieve Huq) 05/28/2022 1529   LEUKOCYTESUR LARGE (Amran Malter) 05/28/2022 1529    Radiological Exams on Admission: [place] EKG SITE RITE  Result Date: 05/28/2022 If Site Rite image not attached, placement could not be confirmed due to current cardiac rhythm.  DG Chest Port 1 View  Result Date: 05/28/2022 CLINICAL DATA:  Shortness of breath and weakness EXAM: PORTABLE CHEST 1 VIEW COMPARISON:  Portable exam 1352 hours compared to 09/09/2021 FINDINGS: Loop recorder projects over chest. Upper normal heart size. Mediastinal contours and pulmonary vascularity normal. Subsegmental atelectasis RIGHT base. Lungs  otherwise clear. No pulmonary infiltrate, pleural effusion, or pneumothorax. Osseous structures unremarkable. IMPRESSION: Subsegmental atelectasis RIGHT base. Electronically Signed   By: Lavonia Dana M.D.   On: 05/28/2022 14:10    EKG: Independently reviewed. Poor baseline, suspect due to tremor,   Assessment/Plan Principal Problem:   Osteomyelitis (HCC) Active Problems:   Sacral osteomyelitis (HCC)   Assessment and Plan:  Shortness of Breath Some chronicity to this since his stroke, but was worse today, lasted longer. Now resolved.  Workup to this point unremarkable with negative troponin x2, BNP wnl. CXR with subsegmental atelectasis R base At this point will continue to monitor, no additional w/u unless recurrent.   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 Planning for PICC and then d/c on IV abx per ID --- culture with VRE and finegoldia per ID notes UA with pyuria, no clear symptoms of UTI, will not follow culture unless other concerns - foley exchanged Wound care c/s Lives at home  with wife, has caregiver - plan for back home after picc placement  History PE on xarelto  History Atrial Fibrillation Last dose 1/23     DVT prophylaxis: xarelto  Code Status:   DNR, had golden DNR at bedside - confirmed with patient and wife Family Communication:  wife  Disposition Plan:   Patient is from:  home  Anticipated DC to:    Anticipated DC date:  1/25  Anticipated DC barriers: Pending PICC  Consults called:  ID, IR  Admission status:  obs   Severity of Illness: The appropriate patient status for this patient is OBSERVATION. Observation status is judged to be reasonable and necessary in order to provide the required intensity of service to ensure the patient's safety. The patient's presenting symptoms, physical exam findings, and initial radiographic and laboratory data in the context of their medical condition is felt to place them at decreased risk for further clinical deterioration. Furthermore, it is anticipated that the patient will be medically stable for discharge from the hospital within 2 midnights of admission.     Fayrene Helper MD [facility]  How to contact the [facility] Attending or Consulting provider [place] or covering provider during after hours [place], for this patient?   Check the care team in [facility] and look for Fillmore Bynum) attending/consulting [facility] provider listed and b) the [facility] team listed Log into [email or web address] and use [place]'s universal password to access. If you do not have the password, please contact the hospital operator. Locate the [facility] provider you are looking for under [facility] and page to Russie Gulledge number that you can be directly reached. If you still have difficulty reaching the provider, please page the [facility] (Director on Call) for the Hospitalists listed on amion for assistance.  05/28/2022, 7:50 PM

## 2022-05-28 NOTE — Progress Notes (Signed)
Pharmacy Antibiotic Note  [name] is a 77 y.o. male admitted on 05/28/2022 with  chronic sacral decub osteo . Admitted to ED with acute dyspnea. Has been on daptomycin IV for 6 weeks seen at [facility]. ID following while admitted. Pharmacy has been consulted for daptomycin dosing.  Ok to discharge from ID standpoint. Will need to f/u with PICC placement prior to discharge. Will receive daptomycin via OPAT orders at discharge.   Plan: Continue daptomycin 10 mg/kg IV q24h  F/u plan to receive PICC while inpatient   Monitor cultures and renal function and adjust regimen as appropriate      Temp (24hrs), Avg:97.9 F (36.6 C), Min:97.8 F (36.6 C), Max:98 F (36.7 C)  Recent Labs  Lab 05/28/22 1346  WBC 4.7  CREATININE 0.34*  LATICACIDVEN 0.8    CrCl cannot be calculated (Unknown ideal weight.).    No Known Allergies  Antimicrobials this admission: daptomycin 1/16 >> 2/27   Dose adjustments this admission: N/a  Microbiology results: 1/24 BCx: pending 1/24 Resp panel: negative    Thank you for allowing pharmacy to be a part of this patient's care.  Gena Fray, PharmD PGY1 Pharmacy Resident   05/28/2022 6:47 PM

## 2022-05-28 NOTE — ED Provider Notes (Signed)
[facility] Provider Note   CSN: [id number] Arrival date & time: 05/28/22  1326     History  Chief Complaint  Patient presents with   Weakness    [name] is a 77 y.o. male.  77 year old male with prior medical history as detailed below presents for evaluation.  Patient with known sacral osteomyelitis related to bedsore.  Patient is bedbound at baseline.  Patient reports increased shortness of breath and weakness.  Patient reports increasing symptoms of weakness over the last 2 to 3 days.  Decreased p.o. intake reported by the patient's wife.  Patient known to Dr. Linus Salmons with ID.  Patient is scheduled for PICC line placement on January 30.  Patient to receive a total of 42 days of daptomycin IV for treatment of sacral osteomyelitis.  Patient apparently failed p.o. linezolid secondary to reactions to same.  Patient has not been on antibiotics for the last week for treatment of apparent sacral osteomyelitis.  The history is provided by the patient and medical records.       Home Medications Prior to Admission medications   Medication Sig Start Date End Date Taking? Authorizing Provider  acetaminophen (TYLENOL) 325 MG tablet Take 2 tablets (650 mg total) by mouth every 4 (four) hours as needed for mild pain (or temp > 37.5 C (99.5 F)). 01/09/21   Angiulli, Lavon Paganini, PA-C  Ascorbic Acid (VITAMIN C PO) Take 1 tablet by mouth daily.    [provider]  Baclofen 5 MG TABS Take 5 mg by mouth 3 (three) times daily. Patient not taking: Reported on 05/05/2021 03/27/21   Marcial Pacas, MD  bisacodyl (DULCOLAX) 10 MG suppository Place 1 suppository (10 mg total) rectally daily at 8 pm. Patient not taking: Reported on 05/20/2022 01/09/21   Angiulli, Lavon Paganini, PA-C  Cholecalciferol (VITAMIN D3 PO) Take 1 tablet by mouth daily.    [provider]  diphenhydrAMINE-APAP, sleep, (TYLENOL PM EXTRA STRENGTH PO) Take 2 tablets by mouth daily as  needed (pain). Patient not taking: Reported on 05/20/2022    [provider]  ELDERBERRY PO Take 1 capsule by mouth daily. Patient not taking: Reported on 05/20/2022    [provider]  fludrocortisone (FLORINEF) 0.1 MG tablet Take 1 tablet (0.1 mg total) by mouth daily. For orthostatic hypotension/low BP Patient not taking: Reported on 05/20/2022 03/06/21   Lovorn, Jinny Blossom, MD  lidocaine (LIDODERM) 5 % Place 3 patches onto the skin daily. Remove & Discard patch within 12 hours or as directed by MD Patient not taking: Reported on 05/05/2021 01/09/21   Angiulli, Lavon Paganini, PA-C  linezolid (ZYVOX) 600 MG tablet Take 1 tablet (600 mg total) by mouth 2 (two) times daily. 05/20/22   Thayer Headings, MD  liver oil-zinc oxide (DESITIN) 40 % ointment Apply 1 application. topically daily. Patient not taking: Reported on 05/20/2022    [provider]  Misc Natural Product ([place] #1 OP) Place 1 drop into both eyes daily as needed (dry eyes). Patient not taking: Reported on 05/20/2022    [provider]  pantoprazole (PROTONIX) 40 MG tablet Take 1 tablet (40 mg total) by mouth daily. Patient not taking: Reported on 09/09/2021 01/09/21 02/08/21  Angiulli, Lavon Paganini, PA-C  rivaroxaban (XARELTO) 20 MG TABS tablet Take 1 tablet (20 mg total) by mouth daily with supper. 06/03/21   [name], MD  sodium chloride (OCEAN) 0.65 % nasal spray Place 1 spray into the nose  daily as needed for congestion. Patient not taking: Reported on 05/20/2022    [provider]  zinc gluconate 50 MG tablet Take 50 mg by mouth daily.    [provider]      Allergies    Patient has no known allergies.    Review of Systems   Review of Systems  All other systems reviewed and are negative.   Physical Exam Updated Vital Signs BP (!) 148/80   Pulse 71   Temp 97.8 F (36.6 C) (Oral)   Resp 18   SpO2 98%  Physical Exam Vitals and nursing note reviewed.  Constitutional:       General: He is not in acute distress.    Appearance: Normal appearance. He is well-developed.  HENT:     Head: Normocephalic and atraumatic.  Eyes:     Conjunctiva/sclera: Conjunctivae normal.     Pupils: Pupils are equal, round, and reactive to light.  Cardiovascular:     Rate and Rhythm: Normal rate and regular rhythm.     Heart sounds: Normal heart sounds.  Pulmonary:     Effort: Pulmonary effort is normal. No respiratory distress.     Breath sounds: Normal breath sounds.  Abdominal:     General: There is no distension.     Palpations: Abdomen is soft.     Tenderness: There is no abdominal tenderness.  Genitourinary:    Comments: Chronic Foley catheter in place. Musculoskeletal:        General: No deformity. Normal range of motion.     Cervical back: Normal range of motion and neck supple.  Skin:    General: Skin is warm and dry.     Comments: 3 x 3 cm decubitus ulcer to the sacrum.  Packing in place.  Minimal surrounding erythema.  No significant drainage appreciated.  Neurological:     Mental Status: He is alert and oriented to person, place, and time.     Comments: Bedbound, chronic left sided deficits secondary to prior stroke.     ED Results / Procedures / Treatments   Labs (all labs ordered are listed, but only abnormal results are displayed) Labs Reviewed  CULTURE, BLOOD (ROUTINE X 2)  CULTURE, BLOOD (ROUTINE X 2)  RESP PANEL BY RT-PCR (RSV, FLU A&B, COVID)  [id number]  CBC WITH DIFFERENTIAL/PLATELET  COMPREHENSIVE METABOLIC PANEL  LACTIC ACID, PLASMA  LACTIC ACID, PLASMA  URINALYSIS, ROUTINE W REFLEX MICROSCOPIC  BRAIN NATRIURETIC PEPTIDE  TROPONIN I (HIGH SENSITIVITY)    EKG None  Radiology No results found.  Procedures Procedures    Medications Ordered in ED Medications - No data to display  ED Course/ Medical Decision Making/ A&P Clinical Course as of 05/31/22 1256  Wed May 28, 2022  1701 Spoke with Dr Gale Journey (ID), recommends initiating PICC  line and OPAT (dapto) for his sacral osteomyelitis, recommends exchanging foley but not sending urine culture. Once patient has PICC and OPAT is established should be able to be discharged and f/u with ID in o/p setting. D/w family and pt at bedside, agreeable to plan. Will contact hospitalist for admission to facilitate above.  [SG]    Clinical Course User Index [SG] Jeanell Sparrow, DO                             Medical Decision Making Amount and/or Complexity of Data Reviewed Labs: ordered. Radiology: ordered.  Risk Decision regarding hospitalization.    Medical Screen  Complete  This patient presented to the ED with complaint of weakness, fatigue, decreased p.o. intake.  This complaint involves an extensive number of treatment options. The initial differential diagnosis includes, but is not limited to, infection, metabolic abnormality, etc.  This presentation is: Acute, Chronic, Self-Limited, Previously Undiagnosed, Uncertain Prognosis, Complicated, Systemic Symptoms, and Threat to Life/Bodily Function  Patient is presenting with 2 to 3 days increased weakness, decreased p.o. intake.  Patient was diagnosed with sacral osteomyelitis earlier this month.  Patient has not yet started IV daptomycin as per ordered by Dr. Luciana Axe with ID.  Patient is scheduled for PICC line placement on January 30.  Case discussed with on-call ID - Dr. Renold Don -who agrees with Dr. Ephriam Knuckles initial plan.  Patient would benefit from admission for further workup, PICC line placement, initiation of antibiotics.  Case discussed with oncoming EDP Dr. Wallace Cullens.   Additional history obtained: External records from outside sources obtained and reviewed including prior ED visits and prior Inpatient records.    Lab Tests:  I ordered and personally interpreted labs.  The pertinent results include: CBC, CMP, UA, lactic acid, blood cultures   Imaging Studies ordered:  I ordered imaging studies including chest  x-ray I independently visualized and interpreted obtained imaging which showed NAD I agree with the radiologist interpretation.   Problem List / ED Course:  Weakness, concern for sacral osteomyelitis Disposition:  After consideration of the diagnostic results and the patients response to treatment, I feel that the patent would benefit from completion of ED evaluation and admission.          Final Clinical Impression(s) / ED Diagnoses Final diagnoses:  Sacral osteomyelitis [facility])    Rx / DC Orders ED Discharge Orders     None         Wynetta Fines, MD 05/31/22 1257

## 2022-05-28 NOTE — Progress Notes (Signed)
Patient arrived from the ER. A&Ox4. Dual skin assessment complete with Mia RN. Touch call light ordered for the patient. No concerns at this time.

## 2022-05-28 NOTE — Consult Note (Signed)
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 W/u nonspecific xray suggestion atelectasis -- no sign pna or chf. On xarelto for    No acute changes in his sacral soft tissue process in terms of cellulitis/soft tissue infection since id clinic visit several days prior to this admission  Foley has increased sediment and probably could benefit from change but doesn't appear to be having uti  No other sign of sepsis here. Query if dyspnea related to brief afib/flutter with rvr   Patient lives with wife who said if he was to go to ir appointment next week for picc then he needs to have another van transport/ambulance and would like to avoid that   Cx from bone bx per dr Comer's note vre and finegoldia  Plan: Picc Start daptomycin 10 mg/kg 6 weeks per dr Comer's recommendation. Might have to use higher dose at 10 mg/kg Consider changing out foley if issue with flow/increased sediment Would avoid testing urine as he doesn't seem to have a uti syndrome at this time Pending dyspnea w/u completion, ok to discharge from id standpoint He has a phone appointment with dr Luciana Axe 2/27; he could call clinic and schedule an appointment sooner as needed if he has other concern Discussed with dr Wallace Cullens and his nursing staff     OPAT Orders Discharge antibiotics to be given via PICC line Discharge antibiotics: OPAT Order Details             .Outpatient Parenteral Antibiotic Therapy Consult  Until discontinued       Comments:  Dapto 10 mg/kg for 700 mg  Provider:  (Not yet assigned)  Question Answer Comment  Antibiotic Daptomycin (Cubicin) IVPB   Indications for use vre osteomyelitis   End Date 07/09/2022   Approving ID Provider's Name Rutha Bouchard                 Duration: 6 weeks  End Date: 07/09/22  [facility] Care Per Protocol:  Home health RN for IV administration and teaching; PICC line care and labs.    Labs weekly while on IV antibiotics: _x_ CBC with differential __ BMP _x_ CMP _x_ CRP __ ESR __ Vancomycin trough _x_ CK  __ Please pull PIC at completion of IV antibiotics _x_ Please leave PIC in place until doctor has seen patient or been notified  Fax weekly labs to [phone]   I spent 75 minute reviewing data/chart, and coordinating care and >50% direct face to face time providing counseling/discussing diagnostics/treatment plan with patient     ------------------------------------------------ Active Problems:   * No active hospital problems. *    HPI: [name] is a 77 y.o. 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 He tried linezolid but has adverse effect to it. He has outpatient picc order next week but would need ambulance/transport van to get there  He has acute dyspnea that didn't go away this morning and so came to ed.   Currently he feels well again and is hungry  Afebrile; no leukocytosis; normal lactate Bcx cooking Cxr atelectasis Cr 0.3; normal lft Normal trop Normal bnp   Family History  Problem Relation Age of Onset   Cancer Mother    Dementia Mother    Heart attack Father    Clotting disorder Father      Social History   Tobacco Use   Smoking status: Never   Smokeless tobacco: Never  Vaping Use   Vaping Use: Never used  Substance Use Topics   Alcohol use: Not Currently   Drug use: Never    No Known Allergies  Review of Systems: ROS All Other ROS was negative, except mentioned above   Past Medical History:  Diagnosis Date   CVA (cerebral vascular accident) ([place])        Scheduled Meds: Continuous Infusions: PRN Meds:.   OBJECTIVE: Blood pressure 129/66, pulse (!) 56, temperature 97.8 F (36.6 C), temperature source Oral, resp. rate (!) 8, SpO2 98 %.  Physical Exam  General/constitutional: no distress, pleasant HEENT: Normocephalic, PER, Conj Clear, EOMI, Oropharynx clear Neck supple CV: rrr no mrg Lungs: clear to auscultation, normal respiratory effort Abd: Soft, Nontender Ext: no edema Skin: No Rash Neuro: quadriplegic Gu: foley in place MSK: no peripheral joint swelling/tenderness/warmth    Lab Results Lab Results  Component Value Date   WBC 4.7 05/28/2022   HGB 12.1 (L) 05/28/2022   HCT 36.4 (L) 05/28/2022   MCV 87.9 05/28/2022   PLT 234 05/28/2022    Lab Results  Component Value Date   CREATININE 0.34 (L) 05/28/2022   BUN 14 05/28/2022   NA 136 05/28/2022   K 3.9 05/28/2022   CL 105 05/28/2022   CO2 23 05/28/2022    Lab Results  Component Value Date   ALT 11 05/28/2022   AST 16 05/28/2022   ALKPHOS 98 05/28/2022   BILITOT 1.1 05/28/2022      Microbiology: Recent Results (from the past 240 hour(s))  Culture, blood (routine x 2)     Status: None (Preliminary result)   Collection Time: 05/28/22  2:09 PM   Specimen: BLOOD RIGHT FOREARM  Result Value Ref Range Status   Specimen Description BLOOD RIGHT FOREARM  Final   Special Requests   Final    BOTTLES DRAWN AEROBIC AND ANAEROBIC Blood Culture adequate volume Performed at [facility], [address], [place], [place] [address]    Culture PENDING  Incomplete   Report  Status PENDING  Incomplete  Resp panel by RT-PCR (RSV, Flu A&B, Covid) Anterior Nasal Swab     Status: None   Collection Time: 05/28/22  3:20 PM   Specimen: Anterior Nasal Swab  Result Value Ref Range Status   SARS Coronavirus 2 by RT PCR NEGATIVE NEGATIVE Final    Comment: (NOTE) SARS-CoV-2 target nucleic acids are NOT DETECTED.  The SARS-CoV-2 RNA is generally detectable in upper respiratory specimens during the acute phase of infection. The lowest concentration of SARS-CoV-2 viral copies this assay can detect is  138 copies/mL. A negative result does not preclude SARS-Cov-2 infection and should not be used as the sole basis for treatment or other patient management decisions. A negative result may occur with  improper specimen collection/handling, submission of specimen other than nasopharyngeal swab, presence of viral mutation(s) within the areas targeted by this assay, and inadequate number of viral copies(<138 copies/mL). A negative result must be combined with clinical observations, patient history, and epidemiological information. The expected result is Negative.  Fact Sheet for Patients:  EntrepreneurPulse.com.au  Fact Sheet for Healthcare Providers:  IncredibleEmployment.be  This test is no t yet approved or cleared by the [place] FDA and  has been authorized for detection and/or diagnosis of SARS-CoV-2 by FDA under an Emergency Use Authorization (EUA). This EUA will remain  in effect (meaning this test can be used) for the duration of the COVID-19 declaration under Section 564(b)(1) of the Act, 21 U.S.C.section 360bbb-3(b)(1), unless the authorization is terminated  or revoked sooner.       Influenza A by PCR NEGATIVE NEGATIVE Final   Influenza B by PCR NEGATIVE NEGATIVE Final    Comment: (NOTE) The Xpert Xpress SARS-CoV-2/FLU/RSV plus assay is intended as an aid in the diagnosis of influenza from Nasopharyngeal swab  specimens and should not be used as a sole basis for treatment. Nasal washings and aspirates are unacceptable for Xpert Xpress SARS-CoV-2/FLU/RSV testing.  Fact Sheet for Patients: EntrepreneurPulse.com.au  Fact Sheet for Healthcare Providers: IncredibleEmployment.be  This test is not yet approved or cleared by the [place] FDA and has been authorized for detection and/or diagnosis of SARS-CoV-2 by FDA under an Emergency Use Authorization (EUA). This EUA will remain in effect (meaning this test can be used) for the duration of the COVID-19 declaration under Section 564(b)(1) of the Act, 21 U.S.C. section 360bbb-3(b)(1), unless the authorization is terminated or revoked.     Resp Syncytial Virus by PCR NEGATIVE NEGATIVE Final    Comment: (NOTE) Fact Sheet for Patients: EntrepreneurPulse.com.au  Fact Sheet for Healthcare Providers: IncredibleEmployment.be  This test is not yet approved or cleared by the [place] FDA and has been authorized for detection and/or diagnosis of SARS-CoV-2 by FDA under an Emergency Use Authorization (EUA). This EUA will remain in effect (meaning this test can be used) for the duration of the COVID-19 declaration under Section 564(b)(1) of the Act, 21 U.S.C. section 360bbb-3(b)(1), unless the authorization is terminated or revoked.  Performed at [facility], [place] [address] [place], [place] [address]      Serology:    Imaging: If present, new imagings (plain films, ct scans, and mri) have been personally visualized and interpreted; radiology reports have been reviewed. Decision making incorporated into the Impression / Recommendations.  1/24 cxr FINDINGS: Loop recorder projects over chest.   Upper normal heart size.   Mediastinal contours and pulmonary vascularity normal.   Subsegmental atelectasis RIGHT base.   Lungs otherwise clear.   No  pulmonary infiltrate, pleural effusion, or pneumothorax.   Osseous structures unremarkable.   IMPRESSION: Subsegmental atelectasis RIGHT base.  Jabier Mutton, [facility] [phone] pager    05/28/2022, 4:42 PM

## 2022-05-28 NOTE — ED Notes (Signed)
ED TO INPATIENT HANDOFF REPORT  ED Nurse Name and Phone #: Richardson Landry [phone]  S Name/Age/Gender [name] 77 y.o. male Room/Bed: 026C/026C  Code Status   Code Status: Prior  Home/SNF/Other Home Patient oriented to: self, place, time, and situation Is this baseline? Yes   Triage Complete: Triage complete  Chief Complaint Osteomyelitis [facility]) [M86.9]  Triage Note Shortness of breath and weakness. Per EMS patient has new onset A. Flutter on EKG. Recent diagnosis of bone infection 1 month ago and has untreatable pressure sore. Started on antibiotic and had an allergic reaction to this. Scheduled to place PICC line in the future. Hx of UTIs. Has chronic foley catheter.    Allergies No Known Allergies  Level of Care/Admitting Diagnosis ED Disposition     ED Disposition  Admit   Condition  --   Comment  Hospital Area: [place] [[id number]]  Level of Care: Med-Surg [16]  May place patient in observation at [facility] or [facility] if equivalent level of care is available:: No  Covid Evaluation: Asymptomatic - no recent exposure (last 10 days) testing not required  Diagnosis: Osteomyelitis [facility]) [696789]  Admitting Physician: [name] [phone]  Attending Physician: Cephus Slater, Lamonte Sakai [phone]          B Medical/Surgery History Past Medical History:  Diagnosis Date   CVA (cerebral vascular accident) [facility])    Past Surgical History:  Procedure Laterality Date   LOOP RECORDER INSERTION N/A 12/13/2020   Procedure: LOOP RECORDER INSERTION;  Surgeon: [name], MD;  Location: [place] CV LAB;  Service: Cardiovascular;  Laterality: N/A;   TONSILLECTOMY       A IV Location/Drains/Wounds Patient Lines/Drains/Airways Status     Active Line/Drains/Airways     Name Placement date Placement time Site Days   Peripheral IV 05/28/22 18 G Anterior;Right Forearm 05/28/22  1310  Forearm  less than 1   Peripheral IV 05/28/22 20 G  Anterior;Distal;Right;Upper Arm 05/28/22  1519  Arm  less than 1   Urethral Catheter Katie RN Latex 16 Fr. 09/09/21  1427  Latex  261   Urethral Catheter Lavera Vandermeer, RN Latex 16 Fr. 05/28/22  1844  Latex  less than 1   Pressure Injury 05/05/21 Sacrum Mid Stage 2 -  Partial thickness loss of dermis presenting as a shallow open injury with a red, pink wound bed without slough. 05/05/21  2230  -- 388   Pressure Injury 09/09/21 Sacrum Stage 4 - Full thickness tissue loss with exposed bone, tendon or muscle. 09/09/21  2200  -- 261   Pressure Injury 09/09/21 Leg Right;Lower;Posterior  red scabbed 09/09/21  2200  -- 261   Wound / Incision (Open or Dehisced) 05/05/21 Incision - Open Tibial Posterior;Right 05/05/21  2230  Tibial  388            Intake/Output Last 24 hours No intake or output data in the 24 hours ending 05/28/22 1846  Labs/Imaging Results for orders placed or performed during the hospital encounter of 05/28/22 (from the past 48 hour(s))  Troponin I (High Sensitivity)     Status: None   Collection Time: 05/28/22  1:46 PM  Result Value Ref Range   Troponin I (High Sensitivity) 5 <18 ng/L    Comment: (NOTE) Elevated high sensitivity troponin I (hsTnI) values and significant  changes across serial measurements may suggest ACS but many other  chronic and acute conditions are known to elevate hsTnI results.  Refer to  the "Links" section for chest pain algorithms and additional  guidance. Performed at [facility], [place] [address], [place], [place] [address]   CBC with Differential     Status: Abnormal   Collection Time: 05/28/22  1:46 PM  Result Value Ref Range   WBC 4.7 4.0 - 10.5 K/uL   RBC 4.14 (L) 4.22 - 5.81 MIL/uL   Hemoglobin 12.1 (L) 13.0 - 17.0 g/dL   HCT 36.4 (L) 39.0 - 52.0 %   MCV 87.9 80.0 - 100.0 fL   MCH 29.2 26.0 - 34.0 pg   MCHC 33.2 30.0 - 36.0 g/dL   RDW 15.6 (H) 11.5 - 15.5 %   Platelets 234 150 - 400 K/uL   nRBC 0.0 0.0 - 0.2 %    Neutrophils Relative % 74 %   Neutro Abs 3.4 1.7 - 7.7 K/uL   Lymphocytes Relative 14 %   Lymphs Abs 0.7 0.7 - 4.0 K/uL   Monocytes Relative 10 %   Monocytes Absolute 0.5 0.1 - 1.0 K/uL   Eosinophils Relative 1 %   Eosinophils Absolute 0.1 0.0 - 0.5 K/uL   Basophils Relative 1 %   Basophils Absolute 0.0 0.0 - 0.1 K/uL   Immature Granulocytes 0 %   Abs Immature Granulocytes 0.02 0.00 - 0.07 K/uL    Comment: Performed at [facility], [address] [place], [place] [address]  Comprehensive metabolic panel     Status: Abnormal   Collection Time: 05/28/22  1:46 PM  Result Value Ref Range   Sodium 136 135 - 145 mmol/L   Potassium 3.9 3.5 - 5.1 mmol/L   Chloride 105 98 - 111 mmol/L   CO2 23 22 - 32 mmol/L   Glucose, Bld 110 (H) 70 - 99 mg/dL    Comment: Glucose reference range applies only to samples taken after fasting for at least 8 hours.   BUN 14 8 - 23 mg/dL   Creatinine, Ser 0.34 (L) 0.61 - 1.24 mg/dL   Calcium 9.3 8.9 - 10.3 mg/dL   Total Protein 6.5 6.5 - 8.1 g/dL   Albumin 3.2 (L) 3.5 - 5.0 g/dL   AST 16 15 - 41 U/L   ALT 11 0 - 44 U/L   Alkaline Phosphatase 98 38 - 126 U/L   Total Bilirubin 1.1 0.3 - 1.2 mg/dL   GFR, Estimated >60 >60 mL/min    Comment: (NOTE) Calculated using the CKD-EPI Creatinine Equation (2021)    Anion gap 8 5 - 15    Comment: Performed at [place] [address] [place], [place] [address]  Lactic acid, plasma     Status: None   Collection Time: 05/28/22  1:46 PM  Result Value Ref Range   Lactic Acid, Venous 0.8 0.5 - 1.9 mmol/L    Comment: Performed at [place] [address] [place], [place] [address]  Culture, blood (routine x 2)     Status: None (Preliminary result)   Collection Time: 05/28/22  2:09 PM   Specimen: BLOOD RIGHT FOREARM  Result Value Ref Range   Specimen Description BLOOD RIGHT FOREARM    Special Requests      BOTTLES DRAWN AEROBIC AND ANAEROBIC Blood Culture adequate volume Performed at [facility], [place] [address], [place], [place] [address]    Culture PENDING    Report Status PENDING   Brain natriuretic peptide     Status: None   Collection Time: 05/28/22  2:09 PM  Result Value Ref Range   B Natriuretic Peptide 30.2 0.0 - 100.0 pg/mL    Comment: Performed at [facility], [address], [place], [place] [address]  Resp panel by RT-PCR (RSV, Flu A&B, Covid) Anterior Nasal Swab     Status: None   Collection Time: 05/28/22  3:20 PM   Specimen: Anterior Nasal Swab  Result Value Ref Range   SARS Coronavirus 2 by RT PCR NEGATIVE NEGATIVE    Comment: (NOTE) SARS-CoV-2 target nucleic acids are NOT DETECTED.  The SARS-CoV-2 RNA is generally detectable in upper respiratory specimens during the acute phase of infection. The lowest concentration of SARS-CoV-2 viral copies this assay can detect is 138 copies/mL. A negative result does not preclude SARS-Cov-2 infection and should not be used as the sole basis for treatment or other patient management decisions. A negative result may occur with  improper specimen collection/handling, submission of specimen other than nasopharyngeal swab, presence of viral mutation(s) within the areas targeted by this assay, and inadequate number of viral copies(<138 copies/mL). A negative result must be combined with clinical observations, patient history, and epidemiological information. The expected result is Negative.  Fact Sheet for Patients:  BloggerCourse.com  Fact Sheet for Healthcare Providers:  SeriousBroker.it  This test is no t yet approved or cleared by the [place] FDA and  has been authorized for detection and/or diagnosis of SARS-CoV-2 by FDA under an Emergency Use Authorization (EUA). This EUA will remain  in effect (meaning this test can be used) for the duration of the COVID-19 declaration under Section 564(b)(1) of the Act, 21 U.S.C.section 360bbb-3(b)(1),  unless the authorization is terminated  or revoked sooner.       Influenza A by PCR NEGATIVE NEGATIVE   Influenza B by PCR NEGATIVE NEGATIVE    Comment: (NOTE) The Xpert Xpress SARS-CoV-2/FLU/RSV plus assay is intended as an aid in the diagnosis of influenza from Nasopharyngeal swab specimens and should not be used as a sole basis for treatment. Nasal washings and aspirates are unacceptable for Xpert Xpress SARS-CoV-2/FLU/RSV testing.  Fact Sheet for Patients: BloggerCourse.com  Fact Sheet for Healthcare Providers: SeriousBroker.it  This test is not yet approved or cleared by the [place] FDA and has been authorized for detection and/or diagnosis of SARS-CoV-2 by FDA under an Emergency Use Authorization (EUA). This EUA will remain in effect (meaning this test can be used) for the duration of the COVID-19 declaration under Section 564(b)(1) of the Act, 21 U.S.C. section 360bbb-3(b)(1), unless the authorization is terminated or revoked.     Resp Syncytial Virus by PCR NEGATIVE NEGATIVE    Comment: (NOTE) Fact Sheet for Patients: BloggerCourse.com  Fact Sheet for Healthcare Providers: SeriousBroker.it  This test is not yet approved or cleared by the [place] FDA and has been authorized for detection and/or diagnosis of SARS-CoV-2 by FDA under an Emergency Use Authorization (EUA). This EUA will remain in effect (meaning this test can be used) for the duration of the COVID-19 declaration under Section 564(b)(1) of the Act, 21 U.S.C. section 360bbb-3(b)(1), unless the authorization is terminated or revoked.  Performed at [facility], [address] [place], [place] [address]   Urinalysis, Routine w reflex microscopic -Urine, Clean Catch     Status: Abnormal   Collection Time: 05/28/22  3:29 PM  Result Value Ref Range   Color, Urine AMBER (A) YELLOW     Comment: BIOCHEMICALS MAY BE AFFECTED BY COLOR   APPearance CLOUDY (A)  CLEAR   Specific Gravity, Urine 1.024 1.005 - 1.030   pH 8.0 5.0 - 8.0   Glucose, UA NEGATIVE NEGATIVE mg/dL   Hgb urine dipstick SMALL (A) NEGATIVE   Bilirubin Urine NEGATIVE NEGATIVE   Ketones, ur NEGATIVE NEGATIVE mg/dL   Protein, ur 30 (A) NEGATIVE mg/dL   Nitrite POSITIVE (A) NEGATIVE   Leukocytes,Ua LARGE (A) NEGATIVE   RBC / HPF >50 (H) 0 - 5 RBC/hpf   WBC, UA 21-50 0 - 5 WBC/hpf   Bacteria, UA FEW (A) NONE SEEN   Squamous Epithelial / HPF 0-5 0 - 5 /HPF   Mucus PRESENT    Triple Phosphate Crystal PRESENT     Comment: Performed at [facility], [address], [place], [place] [address]  Troponin I (High Sensitivity)     Status: None   Collection Time: 05/28/22  4:14 PM  Result Value Ref Range   Troponin I (High Sensitivity) 5 <18 ng/L    Comment: (NOTE) Elevated high sensitivity troponin I (hsTnI) values and significant  changes across serial measurements may suggest ACS but many other  chronic and acute conditions are known to elevate hsTnI results.  Refer to the "Links" section for chest pain algorithms and additional  guidance. Performed at [facility], [address] [place], [place] [address]    [place] EKG SITE RITE  Result Date: 05/28/2022 If [facility] image not attached, placement could not be confirmed due to current cardiac rhythm.  DG Chest Port 1 View  Result Date: 05/28/2022 CLINICAL DATA:  Shortness of breath and weakness EXAM: PORTABLE CHEST 1 VIEW COMPARISON:  Portable exam 1352 hours compared to 09/09/2021 FINDINGS: Loop recorder projects over chest. Upper normal heart size. Mediastinal contours and pulmonary vascularity normal. Subsegmental atelectasis RIGHT base. Lungs otherwise clear. No pulmonary infiltrate, pleural effusion, or pneumothorax. Osseous structures unremarkable. IMPRESSION: Subsegmental atelectasis RIGHT base. Electronically Signed   By: Ulyses Southward M.D.    On: 05/28/2022 14:10    Pending Labs Unresulted Labs (From admission, onward)     Start     Ordered   05/28/22 1659  CK  Once,   URGENT        05/28/22 1658   05/28/22 1346  Lactic acid, plasma  Now then every 2 hours,   R (with STAT occurrences)      05/28/22 1346   05/28/22 1346  Culture, blood (routine x 2)  BLOOD CULTURE X 2,   R (with STAT occurrences)      05/28/22 1346            Vitals/Pain Today's Vitals   05/28/22 1333 05/28/22 1344 05/28/22 1530 05/28/22 1715  BP: (!) 148/80  129/66 126/65  Pulse: 71  (!) 56 64  Resp: 18  (!) 8 (!) 8  Temp: 97.8 F (36.6 C)   98 F (36.7 C)  TempSrc: Oral     SpO2: 98%  98% 98%  PainSc:  0-No pain      Isolation Precautions No active isolations  Medications Medications  DAPTOmycin (CUBICIN) 700 mg in sodium chloride 0.9 % IVPB (700 mg Intravenous New Bag/Given 05/28/22 1758)  sodium chloride 0.9 % bolus 1,000 mL (1,000 mLs Intravenous New Bag/Given 05/28/22 1522)    Mobility non-ambulatory     Focused Assessments Renal Assessment Handoff:  Chronic foley, Cloudy urine with sediment   R Recommendations: See Admitting Provider Note  Report given to:   Additional Notes: Packed wound on sacrum covered with  dressing

## 2022-05-28 NOTE — Progress Notes (Incomplete)
PHARMACY CONSULT NOTE FOR:  OUTPATIENT  PARENTERAL ANTIBIOTIC THERAPY (OPAT)  Indication:  Regimen:  End date:   IV antibiotic discharge orders are pended. To discharging provider:  please sign these orders via discharge navigator,  Select New Orders & click on the button choice - Manage This Unsigned Work.     Thank you for allowing pharmacy to be a part of this patient's care.  Lawson Radar 05/28/2022, 3:19 PM

## 2022-05-28 NOTE — Progress Notes (Signed)
PHARMACY CONSULT NOTE FOR:  OUTPATIENT  PARENTERAL ANTIBIOTIC THERAPY (OPAT)  Indication: chronic sacral osteomyelitis  Regimen: daptomycin 10 mg/kg IV q24h  End date: 07/01/22  IV antibiotic discharge orders are pended. To discharging provider:  please sign these orders via discharge navigator,  Select New Orders & click on the button choice - Manage This Unsigned Work.     Thank you for allowing pharmacy to be a part of this patient's care.  Gena Fray, PharmD PGY1 Pharmacy Resident   05/28/2022 8:42 PM

## 2022-05-28 NOTE — ED Provider Notes (Signed)
  Provider Note MRN:  [id number]  Arrival date & time: 05/29/22    ED Course and Medical Decision Making  Assumed care from [facility] at shift change.  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 Once patient has PICC and OPAT is established should be able to be discharged and f/u with ID in o/p setting. D/w family and pt at bedside, agreeable to plan. Will contact hospitalist for admission to facilitate above.  [SG]    Clinical Course User Index [SG] Wynona Dove A, DO   Urine nitrite/leuk +tive with rare bacteria, no additional tx per ID  Admit to [facility] for the above  Procedures  Final Clinical Impressions(s) / ED Diagnoses     ICD-10-CM   1. Sacral osteomyelitis ([place])  M46.28       ED Discharge Orders          Ordered    Home infusion instructions - [facility]        Pending    Method of administration may be changed at the discretion of home infusion pharmacist based upon assessment of the patient and/or caregiver's ability to self-administer the medication ordered        Pending    Anaphylaxis Kit: Provided to treat any anaphylactic reaction to the medication being provided to the patient if First Dose or when requested by physician        Pending    Change dressing on IV access line weekly and PRN        Pending    Flush IV access with Sodium Chloride 0.9% and Heparin 10 units/ml  or 100 units/ml        Pending    [facility] to provide Cath Flo 2mg        Comments: Administer for PICC line occlusion and as ordered by physician for other access device issues.   Pending    [facility] pharmacist to adjust dose for Vancomycin, Aminoglycosides and other anti-infective therapies as requested by physician.        Pending    daptomycin (CUBICIN) IVPB  Every 24 hours        Pending            Discharge Instructions   None        Jeanell Sparrow, DO 05/29/22 0031

## 2022-05-29 ENCOUNTER — Observation Stay (HOSPITAL_COMMUNITY): Payer: Medicare Other

## 2022-05-29 DIAGNOSIS — Z8249 Family history of ischemic heart disease and other diseases of the circulatory system: Secondary | ICD-10-CM | POA: Diagnosis not present

## 2022-05-29 DIAGNOSIS — Z7401 Bed confinement status: Secondary | ICD-10-CM | POA: Diagnosis not present

## 2022-05-29 DIAGNOSIS — G8250 Quadriplegia, unspecified: Secondary | ICD-10-CM | POA: Diagnosis present

## 2022-05-29 DIAGNOSIS — R0602 Shortness of breath: Secondary | ICD-10-CM | POA: Diagnosis not present

## 2022-05-29 DIAGNOSIS — Z1152 Encounter for screening for COVID-19: Secondary | ICD-10-CM | POA: Diagnosis not present

## 2022-05-29 DIAGNOSIS — I2699 Other pulmonary embolism without acute cor pulmonale: Secondary | ICD-10-CM | POA: Diagnosis not present

## 2022-05-29 DIAGNOSIS — Z7901 Long term (current) use of anticoagulants: Secondary | ICD-10-CM | POA: Diagnosis not present

## 2022-05-29 DIAGNOSIS — L89154 Pressure ulcer of sacral region, stage 4: Secondary | ICD-10-CM | POA: Diagnosis present

## 2022-05-29 DIAGNOSIS — M4628 Osteomyelitis of vertebra, sacral and sacrococcygeal region: Secondary | ICD-10-CM | POA: Diagnosis present

## 2022-05-29 DIAGNOSIS — I69365 Other paralytic syndrome following cerebral infarction, bilateral: Secondary | ICD-10-CM | POA: Diagnosis not present

## 2022-05-29 DIAGNOSIS — M869 Osteomyelitis, unspecified: Secondary | ICD-10-CM | POA: Diagnosis present

## 2022-05-29 DIAGNOSIS — Z79899 Other long term (current) drug therapy: Secondary | ICD-10-CM | POA: Diagnosis not present

## 2022-05-29 DIAGNOSIS — Z66 Do not resuscitate: Secondary | ICD-10-CM | POA: Diagnosis present

## 2022-05-29 DIAGNOSIS — Z86711 Personal history of pulmonary embolism: Secondary | ICD-10-CM | POA: Diagnosis not present

## 2022-05-29 HISTORY — PX: IR FLUORO GUIDE CV LINE RIGHT: IMG2283

## 2022-05-29 HISTORY — PX: IR US GUIDE VASC ACCESS RIGHT: IMG2390

## 2022-05-29 LAB — BASIC METABOLIC PANEL
Anion gap: 10 (ref 5–15)
BUN: 12 mg/dL (ref 8–23)
CO2: 23 mmol/L (ref 22–32)
Calcium: 9.2 mg/dL (ref 8.9–10.3)
Chloride: 106 mmol/L (ref 98–111)
Creatinine, Ser: 0.38 mg/dL — ABNORMAL LOW (ref 0.61–1.24)
GFR, Estimated: >60 mL/min (ref >60)
Glucose, Bld: 85 mg/dL (ref 70–99)
Potassium: 3.8 mmol/L (ref 3.5–5.1)
Sodium: 139 mmol/L (ref 135–145)

## 2022-05-29 LAB — CBC
HCT: 37.3 % — ABNORMAL LOW (ref 39.0–52.0)
Hemoglobin: 12 g/dL — ABNORMAL LOW (ref 13.0–17.0)
MCH: 28.6 pg (ref 26.0–34.0)
MCHC: 32.2 g/dL (ref 30.0–36.0)
MCV: 89 fL (ref 80.0–100.0)
Platelets: 228 10*3/uL (ref 150–400)
RBC: 4.19 MIL/uL — ABNORMAL LOW (ref 4.22–5.81)
RDW: 15.9 % — ABNORMAL HIGH (ref 11.5–15.5)
WBC: 5.5 10*3/uL (ref 4.0–10.5)
nRBC: 0 % (ref 0.0–0.2)

## 2022-05-29 MED ORDER — CHLORHEXIDINE GLUCONATE CLOTH 2 % EX PADS
6.0000 | MEDICATED_PAD | Freq: Every day | CUTANEOUS | Status: DC
  Administered 2022-05-29 – 2022-05-30 (×2): 6 via TOPICAL

## 2022-05-29 MED ORDER — DAPTOMYCIN IV (FOR PTA / DISCHARGE USE ONLY): 700.0000 mg | INTRAVENOUS | 0 refills | Status: DC

## 2022-05-29 MED ORDER — LIDOCAINE HCL 1 % IJ SOLN
INTRAMUSCULAR | Status: AC
  Administered 2022-05-29: 20 mL
  Filled 2022-05-29: qty 20

## 2022-05-29 MED ORDER — IOHEXOL 350 MG/ML SOLN
75.0000 mL | Freq: Once | INTRAVENOUS | Status: AC | PRN
  Administered 2022-05-29: 75 mL via INTRAVENOUS

## 2022-05-29 MED ORDER — SODIUM CHLORIDE 0.9 % IV SOLN
700.0000 mg | Freq: Every day | INTRAVENOUS | Status: DC
  Administered 2022-05-29 – 2022-05-30 (×2): 700 mg via INTRAVENOUS
  Filled 2022-05-29 (×2): qty 14

## 2022-05-29 MED ORDER — HEPARIN SOD (PORK) LOCK FLUSH 100 UNIT/ML IV SOLN
INTRAVENOUS | Status: AC
  Filled 2022-05-29: qty 5

## 2022-05-29 MED ORDER — ENOXAPARIN SODIUM 80 MG/0.8ML IJ SOSY
65.0000 mg | PREFILLED_SYRINGE | Freq: Two times a day (BID) | INTRAMUSCULAR | Status: DC
  Administered 2022-05-29 – 2022-05-30 (×3): 65 mg via SUBCUTANEOUS
  Filled 2022-05-29 (×3): qty 0.8

## 2022-05-29 NOTE — Progress Notes (Addendum)
   Patient Status: [facility] - In-pt  Assessment and Plan: Patient in need of venous access.  Patient with history of spinal stroke in 2022, now with quadriplegia, sacral decubitus with osteomyelitis.  He is in need of central line for home antibiotics.    Patient to unit.  He is requesting chest CVC due to his contractures and fear of line malfunction in his arms.  Discussed with Dr. Pascal Lux who is agreeable.   Risks and benefits discussed with the patient including, but not limited to bleeding, infection, vascular injury, pneumothorax which may require chest tube placement, air embolism or even death  All of the patient's questions were answered, patient is agreeable to proceed. Consent signed and in chart.  ______________________________________________________________________   History of Present Illness: [name] is a 77 y.o. male with history of spinal cord stroke in 2022.  Now with osteomyelitis and chronic sacral decub.  PLanning to d/c home on abx. In need of central line.   Allergies and medications reviewed.   Review of Systems: A 12 point ROS discussed and pertinent positives are indicated in the HPI above.  All other systems are negative.  Review of Systems  Constitutional:  Negative for appetite change, chills and fever.  Respiratory:  Negative for cough and choking.   Cardiovascular:  Negative for chest pain.  Gastrointestinal:  Negative for abdominal pain and nausea.  Genitourinary:  Negative for dysuria.  Musculoskeletal:  Negative for back pain.  Psychiatric/Behavioral:  Negative for behavioral problems and confusion.     Vital Signs: BP (!) 94/59 (BP Location: Right Arm)   Pulse 75   Temp 97.9 F (36.6 C) (Oral)   Resp 20   Ht 5\' 8"  (1.727 m) Comment: per pt  Wt 147 lb 4.3 oz (66.8 kg)   SpO2 98%   BMI 22.39 kg/m   Physical Exam Vitals and nursing note reviewed.  Constitutional:      General: He is not in acute distress.    Appearance: Normal  appearance. He is not ill-appearing.  Cardiovascular:     Rate and Rhythm: Normal rate and regular rhythm.  Pulmonary:     Effort: Pulmonary effort is normal.     Breath sounds: Normal breath sounds.  Musculoskeletal:     Comments: Upper extremity contractures  Neurological:     General: No focal deficit present.     Mental Status: He is alert and oriented to person, place, and time.      Imaging reviewed.   Labs:  COAGS: Recent Labs    09/09/21 1413 09/09/21 1437  INR 1.1  --   APTT  --  32    BMP: Recent Labs    09/11/21 0811 09/12/21 0817 05/20/22 1008 05/28/22 1346 05/29/22 0635  NA 139 139 138 136 139  K 3.6 3.3* 4.3 3.9 3.8  CL 113* 110 106 105 106  CO2 20* 23 26 23 23   GLUCOSE 91 94 98 110* 85  BUN 10 6* 18 14 12   CALCIUM 9.1 9.0 9.2 9.3 9.2  CREATININE 0.57* 0.35* 0.3* 0.34* 0.38*  GFRNONAA >60 >60  --  >60 >60       Electronically Signed: Docia Barrier, PA 05/29/2022, 8:46 AM   I spent a total of 15 minutes in face to face in clinical consultation, greater than 50% of which was counseling/coordinating care for venous access.

## 2022-05-29 NOTE — Consult Note (Addendum)
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 Celine Ahr at [place] [facility] POA: Yes Measurement: 4 cms x 2.5 cms x 3.2 cms with 1 cm undermining from 12 to 2 o'clock Wound bed: red moist  Drainage (amount, consistency, odor) minimal serosanguinous  Periwound: Has the appearance of Chronic Tissue Damage  Dressing procedure/placement/frequency: Clean wound with NS, pack with saline moistened gauze using swab to fill daily, cover with foam dressing.  Change foam dressing q3 days and prn soiling.   Patient to continue plan of care as outpatient with wound center.    WOC will not follow at this time.  Re-consult if further needs arise.   Thank you,    Jese Comella MSN, RN-BC, [employer]

## 2022-05-29 NOTE — TOC Initial Note (Signed)
Transition of Care [facility]) - Initial/Assessment Note    Patient Details  Name: [name] MRN: [id number] Date of Birth: [birth year]  Transition of [facility] CM/SW Contact:    Verdell Carmine, RN Phone Number: 05/29/2022, 10:01 AM  Clinical Narrative:                  Met with patient at bedside. Patient received central line today, will go home to DC per MD. Roel Cluck has OPAT orders and set up antibiotics. Communicated with Carolynn Sayers RN.  Called patient wife per his request and  She has lined up appointments for herself today, Cannot come in to get education, however the RN from home health is coming tomorrow and can teach her about central line. She shared that she is not able to leave when he is home, so she made appts while in the hospital. Relayed this  information to Carolynn Sayers who agreed tha this will work out fine Patient will need to go via PTAR this afternoon and receive his dose of daptomycin today.     Expected Discharge Plan: [place] Barriers to Discharge: No Barriers Identified   Patient Goals and CMS Choice     Choice offered to / list presented to : Patient      Expected Discharge Plan and Services   Discharge Planning Services: CM Consult Post Acute Care Choice: [place] arrangements for the past 2 months: [place]: RN [place] Agency: [facility] Date [place]: 05/29/22 Time HH Agency Contacted: 1000 Representative spoke with at [place]: Reinaldo Raddle  Prior Living Arrangements/Services Living arrangements for the past 2 months: [place] with:: Spouse Patient language and need for interpreter reviewed:: Yes Do you feel safe going back to the place where you live?: Yes      Need for Family Participation in Patient Care: Yes (Comment) Care giver support system in place?: Yes (comment)   Criminal Activity/Legal Involvement Pertinent to  Current Situation/Hospitalization: No - Comment as needed  Activities of Daily Living Home Assistive Devices/Equipment: Eyeglasses, [facility] [facility] bed, Wheelchair ADL Screening (condition at time of admission) Patient's cognitive ability adequate to safely complete daily activities?: Yes Is the patient deaf or have difficulty hearing?: No Does the patient have difficulty seeing, even when wearing glasses/contacts?: No Does the patient have difficulty concentrating, remembering, or making decisions?: No Patient able to express need for assistance with ADLs?: Yes Does the patient have difficulty dressing or bathing?: Yes Independently performs ADLs?: No Communication: Independent Dressing (OT): Dependent Does the patient have difficulty walking or climbing stairs?: Yes Weakness of Legs: Both Weakness of Arms/Hands: Both  Permission Sought/Granted                  Emotional Assessment   Attitude/Demeanor/Rapport: Gracious Affect (typically observed): Accepting Orientation: : Oriented to Self, Oriented to Place, Oriented to  Time, Oriented to Situation Alcohol / Substance Use: Not Applicable Psych Involvement: No (comment)  Admission diagnosis:  Osteomyelitis ([place]) [M86.9] Sacral osteomyelitis ([place]) [M46.28] Patient Active Problem List   Diagnosis Date Noted   Osteomyelitis ([place]) 05/28/2022   Sacral osteomyelitis ([place]) 05/20/2022   Catheter-associated urinary tract infection ([place]) 09/12/2021   Decubitus ulcer of sacral region, stage 4 ([place]) 03/50/0938   Complicated UTI (urinary tract infection)  09/09/2021   Pressure ulcer 09/09/2021   Pressure injury of skin 09/09/2021   History of pulmonary embolus (PE) 09/09/2021   History of CVA (cerebrovascular accident) 09/09/2021   Sepsis ([place]) 09/09/2021   Atrial fibrillation with RVR ([place])    Pulmonary embolism ([place]) 05/05/2021   Labile blood pressure    Dysphagia 12/14/2020   Quadriplegia ([place]) 12/13/2020   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 [place] [address] Phone: [phone] Fax: [id number] - [place],  - [address] AT [place] [address] [place] [address] Phone: [phone] Fax: [phone]     Social Determinants of Health (SDOH) Social History: SDOH Screenings   Food Insecurity: No Food Insecurity (05/28/2022)  Housing: Low Risk  (05/28/2022)  Transportation Needs: No Transportation Needs (05/28/2022)  Utilities: Not At Risk (05/28/2022)  Depression (PHQ2-9): Low Risk  (05/20/2022)  Tobacco Use: Low Risk  (05/28/2022)   SDOH Interventions:     Readmission Risk Interventions    05/06/2021   11:43 AM  Readmission Risk Prevention Plan  Transportation Screening Complete  PCP or Specialist Appt within 5-7 Days Complete  Home Care Screening Complete  Medication Review (RN CM) Complete

## 2022-05-29 NOTE — Progress Notes (Signed)
ANTICOAGULATION CONSULT NOTE - Follow Up Consult  Pharmacy Consult for Xarelto to Lovenox Indication: pulmonary embolus  No Known Allergies  Patient Measurements: Height: 5\' 8"  (172.7 cm) (per pt) Weight: 66.8 kg (147 lb 4.3 oz) IBW/kg (Calculated) : 68.4  Vital Signs: Temp: 97.9 F (36.6 C) (01/25 0750) Temp Source: Oral (01/25 0750) BP: 94/59 (01/25 0750) Pulse Rate: 75 (01/25 0750)  Labs: Recent Labs    05/28/22 1346 05/28/22 1614 05/28/22 2040 05/29/22 0635  HGB 12.1*  --   --  12.0*  HCT 36.4*  --   --  37.3*  PLT 234  --   --  228  CREATININE 0.34*  --   --  0.38*  CKTOTAL  --   --  60  --   TROPONINIHS 5 5  --   --     Estimated Creatinine Clearance: 76.5 mL/min (A) (by C-G formula based on SCr of 0.38 mg/dL (L)).  Assessment: Converting Xarelto to Lovenox for ongoing PE Last dose of Xarelto 1/24  Goal of Therapy:  Anti-Xa level 0.6-1 units/ml 4hrs after LMWH dose given Monitor platelets by anticoagulation protocol: Yes   Plan:  Lovenox 65 mg sq every12 hours Follow CBC  Thank you Anette Guarneri, PharmD  05/29/2022,3:09 PM

## 2022-05-29 NOTE — Care Management (Addendum)
Spoke to wife, PTAR called for 1700 1440 after discussion with MD< the patient will likely be going home tomorrow. PTAR cancelled

## 2022-05-29 NOTE — Discharge Summary (Addendum)
Physician Discharge Summary  [name] YQI:[id number] DOB: 03-09-1946 DOA: 05/28/2022  PCP: Annita Brod, MD  Admit date: 05/28/2022 Discharge date: 05/29/2022  Time spent: 40 minutes  Recommendations for Outpatient Follow-up:  Follow outpatient CBC/CMP  Follow wound care per wound clinic Follow shortness of breath outpatient Abx for osteo per ID   Discharge Diagnoses:  Principal Problem:   Osteomyelitis [facility]) Active Problems:   Sacral osteomyelitis (HCC)   Discharge Condition: stable  Diet recommendation: heart healthy  Filed Weights   05/28/22 2213 05/29/22 0500  Weight: 66.8 kg 66.8 kg    History of present illness:  [name] is [name] 77 y.o. 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   Wife notes chronic SOB often positionally dependent since his stroke.  Usually lasts around 30 minutes or so.  This morning was longer so he was sent to hospital.  No other symptoms.  No chest pain.  Symptoms have resolved at this time.  They'd planned on PICC and IV antibiotics for osteo and have requested this be done while he's here given complexity associated with transporting him with his quadriplegia.  S/p tunneled central line 1/25 by IR.  Plan for discharge today.  Discharged delayed due to recurrent SOB, but CT scan only showed reduction of previously seen pulmonary emboli.  Plan to continue on xarelto.  Can investigate persistent SOB outpatient additionally as needed.   Addendum - I reviewed imaging with radiology given continued presence of PE after Sofia Jaquith year of therapy.  Per discussion, findings appear more consistent with acute PE rather than chronic residual thrombus related to the previous CT.  Appears to represent xarelto failure.  Will plan to discharge with lovenox (will discuss first with wife).     Hospital Course:  Assessment and Plan: Shortness of Breath Some chronicity to this since his stroke, but was worse today, lasted longer. Now resolved.  Workup to this point unremarkable with negative troponin x2, BNP wnl. CXR with subsegmental atelectasis R base CT scan with significant interval reduction of previously seen pulmonary emboli, small residual thrombus involving lobar and segmental branches of the right upper lobe, no residual right lower lobe or left sided pulmonary emboli.  RV to LV ratio elevated at 1.4, due to underlying LVH rather than acute right heart strain. At this point will continue to monitor, no additional w/u unless recurrent.    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 S/p tunneled central line per IR 1/25, daptomycin 10 mg/kg x 6 weeks per infectious disease --- culture with VRE and finegoldia per ID notes UA with pyuria, no clear symptoms of UTI, will not follow culture unless other concerns - foley exchanged Continue follow up with wound clinic, continue wound care Lives at home with wife, has caregiver - plan for back home after picc placement   History PE on xarelto  History Atrial Fibrillation Last dose 1/23     Procedures: Tunneled line placement by IR  Consultations: ID IR  Discharge Exam: Vitals:   05/29/22 0750 05/29/22 0750  BP:  (!) 94/59  Pulse:  75  Resp:    Temp: 97.9 F (36.6 C)   SpO2:  98%   No complaints [name] little more tired than normal, otherwise, SOB remains improved  General: No acute distress.  Cardiovascular: RRR Lungs: Clear to auscultation bilaterally with good air movement. No rales, rhonchi or wheezes. Abdomen: Soft, nontender, nondistended Neurological: quadriplegic, alert Skin: sacral decubitus packed with gauze, no surrounding evidence of infection Extremities: RLE wound, has flap of tissue from wound  Discharge  Instructions   Discharge Instructions     [facility] pharmacist to adjust dose for Vancomycin, Aminoglycosides and other anti-infective therapies as requested by physician.   Complete by: As directed    [facility] to provide Cath Flo 2mg    Complete by: As directed    Administer for PICC line occlusion and as ordered by physician for other access device issues.   Anaphylaxis Kit: Provided to treat any anaphylactic reaction to the medication being provided to the patient if First Dose or when requested by physician   Complete by: As directed    Epinephrine 1mg /ml vial / amp: Administer 0.3mg  (0.30ml) subcutaneously once for moderate to severe anaphylaxis, nurse to call physician and pharmacy when reaction occurs and call 911 if needed for immediate care   Diphenhydramine 50mg /ml IV vial: Administer 25-50mg  IV/IM PRN for first dose reaction, rash, itching, mild reaction, nurse to call physician and pharmacy when reaction occurs   Sodium Chloride 0.9% NS IV: Administer if needed for hypovolemic blood pressure drop or as ordered by physician after call to physician with anaphylactic reaction   Call MD for:  difficulty breathing, headache or visual disturbances   Complete by: As directed    Call MD for:  extreme fatigue   Complete by: As directed    Call MD for:  hives   Complete by: As directed    Call MD for:  persistant dizziness or light-headedness   Complete by: As directed    Call MD for:  persistant nausea and vomiting   Complete by: As directed    Call MD for:  redness, tenderness, or signs of infection (pain, swelling, redness, odor or green/yellow discharge around incision site)   Complete by: As directed    Call MD for:  severe uncontrolled pain   Complete by: As directed    Call MD for:  temperature >100.4   Complete by: As directed    Change dressing on IV access line weekly and PRN   Complete by: As directed    Diet - low sodium heart healthy    Complete by: As directed    Discharge instructions   Complete by: As directed    You were seen for shortness of breath.  The cause was unclear, but your workup was reassuring and your symptoms are currently resolved.  If this returns, you may need additional workup including Sharmila Wrobleski possible CT scan, but given the relative chronicity and resolution of your symptoms, I think this is ok to follow outpatient.  We placed Westyn Driggers PICC line for antibiotics as ordered by infectious disease.  Follow up with infectious disease outpatient as planned.  Continue wound care per the wound clinic.  Return for new, recurrent, or worsening symptoms.  Please ask your PCP to request records from this hospitalization so they know what was done and what the next steps will be.   Discharge wound care:   Complete by: As directed    Continue wound care per wound clinic instructions   Flush IV access with Sodium Chloride 0.9% and Heparin 10 units/ml or 100 units/ml   Complete by: As directed    Home infusion instructions - [facility]   Complete by: As  directed    Instructions: Flush IV access with Sodium Chloride 0.9% and Heparin 10units/ml or 100units/ml   Change dressing on IV access line: Weekly and PRN   Instructions Cath Flo 2mg : Administer for PICC Line occlusion and as ordered by physician for other access device   [facility] pharmacist to adjust dose for: Vancomycin, Aminoglycosides and other anti-infective therapies as requested by physician   Increase activity slowly   Complete by: As directed    Method of administration may be changed at the discretion of home infusion pharmacist based upon assessment of the patient and/or caregiver's ability to self-administer the medication ordered   Complete by: As directed       Allergies as of 05/29/2022   No Known Allergies      Medication List     STOP taking these medications    acetaminophen 325 MG tablet Commonly known as: TYLENOL    Baclofen 5 MG Tabs   bisacodyl 10 MG suppository Commonly known as: DULCOLAX   fludrocortisone 0.1 MG tablet Commonly known as: FLORINEF   lidocaine 5 % Commonly known as: LIDODERM   linezolid 600 MG tablet Commonly known as: ZYVOX   pantoprazole 40 MG tablet Commonly known as: PROTONIX   VITAMIN C PO   VITAMIN D3 PO       TAKE these medications    daptomycin  IVPB Commonly known as: CUBICIN Inject 700 mg into the vein daily. Indication: chronic osteomyelitis First Dose: Yes Last Day of Therapy:  07/09/22 Labs - Once weekly:  CBC/D, BMP, and CPK Labs - Every other week:  ESR and CRP Method of administration: IV Push Pull PICC line at the completion of IV antibiotics Method of administration may be changed at the discretion of home infusion pharmacist based upon assessment of the patient and/or caregiver's ability to self-administer the medication ordered.   rivaroxaban 20 MG Tabs tablet Commonly known as: XARELTO Take 1 tablet (20 mg total) by mouth daily with supper.   sodium chloride 0.65 % nasal spray Commonly known as: OCEAN Place 1 spray into the nose daily as needed for congestion.               Discharge Care Instructions  (From admission, onward)           Start     Ordered   05/29/22 0000  Change dressing on IV access line weekly and PRN  (Home infusion instructions - [facility] )        05/29/22 1026   05/29/22 0000  Discharge wound care:       Comments: Continue wound care per wound clinic instructions   05/29/22 1026           No Known Allergies    The results of significant diagnostics from this hospitalization (including imaging, microbiology, ancillary and laboratory) are listed below for reference.    Significant Diagnostic Studies: [place] EKG SITE RITE  Result Date: 05/28/2022 If Site Rite image not attached, placement could not be confirmed due to current cardiac rhythm.  DG Chest Port 1 View  Result Date:  05/28/2022 CLINICAL DATA:  Shortness of breath and weakness EXAM: PORTABLE CHEST 1 VIEW COMPARISON:  Portable exam 1352 hours compared to 09/09/2021 FINDINGS: Loop recorder projects over chest. Upper normal heart size. Mediastinal contours and pulmonary vascularity normal. Subsegmental atelectasis RIGHT base. Lungs otherwise clear. No pulmonary infiltrate, pleural effusion, or pneumothorax. Osseous structures unremarkable. IMPRESSION: Subsegmental atelectasis RIGHT base. Electronically Signed   By:  Ulyses Southward M.D.   On: 05/28/2022 14:10   CUP PACEART REMOTE DEVICE CHECK  Result Date: 05/20/2022 ILR summary report received. Battery status OK. Normal device function. No new symptom, tachy, brady, or pause episodes. No new AF episodes. Monthly summary reports and ROV/PRN LA   Microbiology: Recent Results (from the past 240 hour(s))  Culture, blood (routine x 2)     Status: None (Preliminary result)   Collection Time: 05/28/22  2:09 PM   Specimen: BLOOD RIGHT FOREARM  Result Value Ref Range Status   Specimen Description BLOOD RIGHT FOREARM  Final   Special Requests   Final    BOTTLES DRAWN AEROBIC AND ANAEROBIC Blood Culture adequate volume   Culture   Final    NO GROWTH < 24 HOURS Performed at [facility], [address] [place], [place] [address]    Report Status PENDING  Incomplete  Culture, blood (routine x 2)     Status: None (Preliminary result)   Collection Time: 05/28/22  3:20 PM   Specimen: BLOOD  Result Value Ref Range Status   Specimen Description BLOOD RIGHT ANTECUBITAL  Final   Special Requests   Final    BOTTLES DRAWN AEROBIC AND ANAEROBIC Blood Culture adequate volume   Culture   Final    NO GROWTH < 24 HOURS Performed at [facility], [address] [place], [place] [address]    Report Status PENDING  Incomplete  Resp panel by RT-PCR (RSV, Flu Akai Dollard&B, Covid) Anterior Nasal Swab     Status: None   Collection Time: 05/28/22  3:20 PM   Specimen: Anterior Nasal  Swab  Result Value Ref Range Status   SARS Coronavirus 2 by RT PCR NEGATIVE NEGATIVE Final    Comment: (NOTE) SARS-CoV-2 target nucleic acids are NOT DETECTED.  The SARS-CoV-2 RNA is generally detectable in upper respiratory specimens during the acute phase of infection. The lowest concentration of SARS-CoV-2 viral copies this assay can detect is 138 copies/mL. Jahzara Slattery negative result does not preclude SARS-Cov-2 infection and should not be used as the sole basis for treatment or other patient management decisions. Shonna Deiter negative result may occur with  improper specimen collection/handling, submission of specimen other than nasopharyngeal swab, presence of viral mutation(s) within the areas targeted by this assay, and inadequate number of viral copies(<138 copies/mL). Kisha Messman negative result must be combined with clinical observations, patient history, and epidemiological information. The expected result is Negative.  Fact Sheet for Patients:  BloggerCourse.com  Fact Sheet for Healthcare Providers:  SeriousBroker.it  This test is no t yet approved or cleared by the [place] FDA and  has been authorized for detection and/or diagnosis of SARS-CoV-2 by FDA under an Emergency Use Authorization (EUA). This EUA will remain  in effect (meaning this test can be used) for the duration of the COVID-19 declaration under Section 564(b)(1) of the Act, 21 U.S.C.section 360bbb-3(b)(1), unless the authorization is terminated  or revoked sooner.       Influenza Sanyiah Kanzler by PCR NEGATIVE NEGATIVE Final   Influenza B by PCR NEGATIVE NEGATIVE Final    Comment: (NOTE) The Xpert Xpress SARS-CoV-2/FLU/RSV plus assay is intended as an aid in the diagnosis of influenza from Nasopharyngeal swab specimens and should not be used as Aigner Horseman sole basis for treatment. Nasal washings and aspirates are unacceptable for Xpert Xpress SARS-CoV-2/FLU/RSV testing.  Fact Sheet for  Patients: BloggerCourse.com  Fact Sheet for Healthcare Providers: SeriousBroker.it  This test is not yet approved or cleared  by the [place] and has been authorized for detection and/or diagnosis of SARS-CoV-2 by FDA under an Emergency Use Authorization (EUA). This EUA will remain in effect (meaning this test can be used) for the duration of the COVID-19 declaration under Section 564(b)(1) of the Act, 21 U.S.C. section 360bbb-3(b)(1), unless the authorization is terminated or revoked.     Resp Syncytial Virus by PCR NEGATIVE NEGATIVE Final    Comment: (NOTE) Fact Sheet for Patients: EntrepreneurPulse.com.au  Fact Sheet for Healthcare Providers: IncredibleEmployment.be  This test is not yet approved or cleared by the [place] FDA and has been authorized for detection and/or diagnosis of SARS-CoV-2 by FDA under an Emergency Use Authorization (EUA). This EUA will remain in effect (meaning this test can be used) for the duration of the COVID-19 declaration under Section 564(b)(1) of the Act, 21 U.S.C. section 360bbb-3(b)(1), unless the authorization is terminated or revoked.  Performed at [facility], [place] [address], [place], [place] [address]      Labs: Basic Metabolic Panel: Recent Labs  Lab 05/28/22 1346 05/29/22 0635  NA 136 139  K 3.9 3.8  CL 105 106  CO2 23 23  GLUCOSE 110* 85  BUN 14 12  CREATININE 0.34* 0.38*  CALCIUM 9.3 9.2   Liver Function Tests: Recent Labs  Lab 05/28/22 1346  AST 16  ALT 11  ALKPHOS 98  BILITOT 1.1  PROT 6.5  ALBUMIN 3.2*   No results for input(s): "LIPASE", "AMYLASE" in the last 168 hours. No results for input(s): "AMMONIA" in the last 168 hours. CBC: Recent Labs  Lab 05/28/22 1346 05/29/22 0635  WBC 4.7 5.5  NEUTROABS 3.4  --   HGB 12.1* 12.0*  HCT 36.4* 37.3*  MCV 87.9 89.0  PLT 234 228   Cardiac  Enzymes: Recent Labs  Lab 05/28/22 2040  CKTOTAL 60   BNP: BNP (last 3 results) Recent Labs    05/28/22 1409  BNP 30.2    ProBNP (last 3 results) No results for input(s): "PROBNP" in the last 8760 hours.  CBG: No results for input(s): "GLUCAP" in the last 168 hours.     Signed:  Fayrene Helper MD.  [facility] 05/29/2022, 10:55 AM

## 2022-05-29 NOTE — Progress Notes (Signed)
PHARMACY CONSULT NOTE FOR:  OUTPATIENT  PARENTERAL ANTIBIOTIC THERAPY (OPAT)  NOTE: Addended end date from OPAT written 05/28/22   Indication: chronic sacral osteomyelitis  Regimen: daptomycin 10 mg/kg IV q24h  End date: 07/09/22  IV antibiotic discharge orders are pended. To discharging provider:  please sign these orders via discharge navigator,  Select New Orders & click on the button choice - Manage This Unsigned Work.     Adria Dill, PharmD PGY-2 Infectious Diseases Resident  05/29/2022 7:11 AM

## 2022-05-29 NOTE — Telephone Encounter (Signed)
Notified IR to cancel PICC placement and short stay appointment as patient was admitted and started on dapto via CVC. Carolynn Sayers with [employer] aware.   Beryle Flock, RN

## 2022-05-29 NOTE — Procedures (Signed)
Pre procedural Diagnosis: Poor venous access Post Procedural Diagnosis: Same  Successful placement of right IJ approach tunneled dual lumen CVC with tip at the superior caval-atrial junction.    EBL: Trace No immediate post procedural complication.  The CVC is ready for immediate use.  Ronny Bacon, MD Pager #: [phone]

## 2022-05-30 ENCOUNTER — Inpatient Hospital Stay (HOSPITAL_COMMUNITY): Payer: Medicare Other

## 2022-05-30 ENCOUNTER — Other Ambulatory Visit (HOSPITAL_COMMUNITY): Payer: Self-pay

## 2022-05-30 DIAGNOSIS — R0602 Shortness of breath: Secondary | ICD-10-CM

## 2022-05-30 DIAGNOSIS — M869 Osteomyelitis, unspecified: Secondary | ICD-10-CM | POA: Diagnosis not present

## 2022-05-30 DIAGNOSIS — G8250 Quadriplegia, unspecified: Secondary | ICD-10-CM

## 2022-05-30 DIAGNOSIS — Z86711 Personal history of pulmonary embolism: Secondary | ICD-10-CM | POA: Diagnosis not present

## 2022-05-30 DIAGNOSIS — I2699 Other pulmonary embolism without acute cor pulmonale: Secondary | ICD-10-CM

## 2022-05-30 LAB — COMPREHENSIVE METABOLIC PANEL
ALT: 10 U/L (ref 0–44)
AST: 14 U/L — ABNORMAL LOW (ref 15–41)
Albumin: 3 g/dL — ABNORMAL LOW (ref 3.5–5.0)
Alkaline Phosphatase: 97 U/L (ref 38–126)
Anion gap: 9 (ref 5–15)
BUN: 11 mg/dL (ref 8–23)
CO2: 24 mmol/L (ref 22–32)
Calcium: 9.3 mg/dL (ref 8.9–10.3)
Chloride: 106 mmol/L (ref 98–111)
Creatinine, Ser: 0.32 mg/dL — ABNORMAL LOW (ref 0.61–1.24)
GFR, Estimated: >60 mL/min (ref >60)
Glucose, Bld: 100 mg/dL — ABNORMAL HIGH (ref 70–99)
Potassium: 3.5 mmol/L (ref 3.5–5.1)
Sodium: 139 mmol/L (ref 135–145)
Total Bilirubin: 1 mg/dL (ref 0.3–1.2)
Total Protein: 6 g/dL — ABNORMAL LOW (ref 6.5–8.1)

## 2022-05-30 LAB — CBC WITH DIFFERENTIAL/PLATELET
Abs Immature Granulocytes: 0.01 10*3/uL (ref 0.00–0.07)
Basophils Absolute: 0 10*3/uL (ref 0.0–0.1)
Basophils Relative: 1 %
Eosinophils Absolute: 0.1 10*3/uL (ref 0.0–0.5)
Eosinophils Relative: 1 %
HCT: 34.9 % — ABNORMAL LOW (ref 39.0–52.0)
Hemoglobin: 11.6 g/dL — ABNORMAL LOW (ref 13.0–17.0)
Immature Granulocytes: 0 %
Lymphocytes Relative: 22 %
Lymphs Abs: 1.2 10*3/uL (ref 0.7–4.0)
MCH: 29.1 pg (ref 26.0–34.0)
MCHC: 33.2 g/dL (ref 30.0–36.0)
MCV: 87.5 fL (ref 80.0–100.0)
Monocytes Absolute: 0.6 10*3/uL (ref 0.1–1.0)
Monocytes Relative: 11 %
Neutro Abs: 3.5 10*3/uL (ref 1.7–7.7)
Neutrophils Relative %: 65 %
Platelets: 211 10*3/uL (ref 150–400)
RBC: 3.99 MIL/uL — ABNORMAL LOW (ref 4.22–5.81)
RDW: 15.9 % — ABNORMAL HIGH (ref 11.5–15.5)
WBC: 5.3 10*3/uL (ref 4.0–10.5)
nRBC: 0 % (ref 0.0–0.2)

## 2022-05-30 LAB — ECHOCARDIOGRAM COMPLETE
Height: 68 in
S' Lateral: 3.1 cm
Weight: 2356.28 oz

## 2022-05-30 LAB — MAGNESIUM: Magnesium: 2.4 mg/dL (ref 1.7–2.4)

## 2022-05-30 LAB — PHOSPHORUS: Phosphorus: 3.4 mg/dL (ref 2.5–4.6)

## 2022-05-30 MED ORDER — ENOXAPARIN SODIUM 100 MG/ML IJ SOSY
1.5000 mg/kg | PREFILLED_SYRINGE | INTRAMUSCULAR | 1 refills | Status: AC
  Filled 2022-05-30: qty 30, 30d supply, fill #0

## 2022-05-30 NOTE — CM Medical Necessity Determination (Signed)
[facility] Physician Certication Statement   SECTION I - GENERAL INFORMATION   Patient's Name: [name] Date of Birth: [birth year] Medicare #: [id number]  Transport Date: 05-30-2022  (PCS is valid for round trips on this date and for all repetitive trips in the 60-day range as noted below)  Origin: [facility]  Destination: [address] [place],    [place] [address] (Is the pt's stay covered under Medicare Part A (PPS/DRG): yes  Closest appropriate facility?: yes  If No, why is transport to more distant facility required?   (If hosp-hosp transfer, describe services needed at 2nd facility not available at 1st facility)   If hospice pt, is this transport related to pt's terminal illness? . N/A  . If NO Describe:   [place]   Ambulance Transportation is medically necessary only if other means of transport are contraindicated or would be potentially harmful to  the patient. To meet this requirement, the patient must be either "bed confined" or suffer from a condition such that transport by means  other than ambulance is contraindicated by the patient's condition The following questions must be answered by the medical  professional signing below for this form to be valid:   1) Describe the MEDICAL CONDITION (physical and/or mental) of this patient AT [place] that requires  the patient to be transported in an ambulance and why transport by other means is contraindicated by the patient's condition: Quadraplegic and unable to sit in chair.  2) Is this patient "bed confined" as defined below? Yes.   To be "bed confined" the patient must satisfy all three of the following conditions: (1) unable to get up from bed without Assistance; AND (2) unable to ambulate; AND (3) unable to sit in a chair or wheelchair   3)  Can this patient safely be transported by car or wheelchair van  (i.e., seated during transport, without a medical attendant or monitoring?) No. .  4) In addition to completing questions 1-3 above, please check any of the following conditions that apply*: Unable to tolerate seated position for time needed to transport  *Note: supporting documentation for any boxes checked must be maintained in the patient's medical records   .  Marland Kitchen   SECTION III - SIGNATURE OF PHYSICIAN OR HEALTHCARE PROFESSIONAL   I certify that the above information is true and correct based on my evaluation of this patient, and represent that the patient requires  transport by ambulance and that other forms of transport are contraindicated. I understand that this information will be used by the  Centers for Medicare and Medicaid Services (CMS) to support the determination of medical necessity for ambulance services, and I  represent that I have personal knowledge of the patient's condition at the time of transport.   .  If "YES" is selected: Yes.  , I also certify that the patient is physically or mentally incapable of signing the ambulance service's claim and that the institution with which I am affiliated has furnished care, services or assistance to the patient. My signature below is made on behalf of the patient pursuant to 42 CFR 424.36(b)(4). In accordance with 42 CFR 424.37, the specific reason(s) that the patient is physically or  mentally incapable of signing the claim form is as follows: Patient is Quadraplegic.    Physician* or Healthcare Professional:  Juventino Slovak, RN   Signature of Physician or Healthcare Professional: Juventino Slovak, RN_______________________  Date Signed: 05-30-2022 (For scheduled repetitive transport, this form is not valid for  transports performed more than 60 days after this date).   Printed Name and Credentials of Physician or Neurosurgeon (MD, DO, RN, etc.)  Juventino Slovak, RN *Form must be signed only by patient's attending physician  for scheduled, repetitive transports. For non-repetitive, unscheduled ambulance  transports, if unable to obtain the signature of the attending physician, any of the following may sign (please check appropriate box below): Registered Nurse  .   [facility], [address], [place], [place], [place]

## 2022-05-30 NOTE — TOC Benefit Eligibility Note (Signed)
Patient Teacher, English as a foreign language completed.    The patient is currently admitted and upon discharge could be taking enoxaparin (Lovenox) 80 mg/0.8 ml.  The current 30 day co-pay is $5.00.   The patient is insured through [employer], [place] Patient Advocate Specialist [place] Patient Advocate Team Direct Number: [phone]  Fax: [phone]

## 2022-05-30 NOTE — Discharge Summary (Signed)
Physician Discharge Summary  Braylon Grenda CWC:[id number] DOB: 1945-06-12 DOA: 05/28/2022  PCP: Clovia Cuff, MD  Admit date: 05/28/2022 Discharge date: 05/30/2022  Time spent: 40 minutes  Recommendations for Outpatient Follow-up:  Follow outpatient CBC/CMP  Follow wound care per wound clinic Transitioned to lovenox, follow outpatient - referring to heme outpatient given xarelto failure Follow shortness of breath outpatient Abx for osteo per ID   Discharge Diagnoses:  Principal Problem:   Osteomyelitis ([place]) Active Problems:   Sacral osteomyelitis ([place])   Shortness of breath   Discharge Condition: stable  Diet recommendation: heart healthy  Filed Weights   05/28/22 2213 05/29/22 0500  Weight: 66.8 kg 66.8 kg    History of present illness:  Terrin Meddaugh is Traniya Prichett 77 y.o. 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   Wife notes chronic SOB often positionally dependent since his stroke.  Usually lasts around 30 minutes or so.  This morning was longer so he was sent to hospital.  No other symptoms.  No chest pain.  Symptoms have resolved at this time.  They'd planned on PICC and IV antibiotics for osteo and have requested this be done while he's here given complexity associated with transporting him with his quadriplegia.  S/p tunneled central line 1/25 by IR.  Plan for discharge today.  Discharged delayed due to recurrent SOB.  CT scan showed acute PE.  Transitioned to lovenox after discussion with oncology.  Stable for discharge 1/26.  Hospital Course:  Assessment and Plan: Shortness of Breath Some chronicity to this since his stroke, but was worse today, lasted longer. Now resolved.  Workup to this point unremarkable with negative troponin x2, BNP wnl. CXR with subsegmental atelectasis R  base Discussed CT scan with rads who noted that this PE looked acute.  Concern in that case for xarelto failure.  LE [place] negative for DVT.  Echo with mildly reduced RVSF, EF 60-65%.  Will discharge on lovenox.     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 Left ventricular ejection fraction, by estimation, is 60 to 65%. The  left ventricle has normal function. The left ventricle has no regional  wall motion abnormalities. Left ventricular diastolic parameters were  normal.   2. Right ventricular systolic function is mildly reduced. The right  ventricular size is mildly enlarged. There is normal pulmonary artery  systolic pressure.   3. The mitral valve is normal in structure. No evidence of mitral valve  regurgitation. No evidence of mitral stenosis.   4. The aortic valve is tricuspid. There is mild calcification of the  aortic valve. There is mild thickening of the aortic valve. Aortic valve  regurgitation is mild. Aortic valve sclerosis is present, with no evidence  of aortic valve stenosis.   5. The inferior vena cava is normal in size with greater than 50%  respiratory variability, suggesting right atrial pressure of 3 mmHg.   LE [place] Summary:  BILATERAL:  - No evidence of deep vein thrombosis seen in the lower extremities,  bilaterally.  -No evidence of popliteal  cyst, bilaterally.   Consultations: ID IR  Discharge Exam: Vitals:   05/30/22 0951 05/30/22 1524  BP: (!) 104/59 100/61  Pulse: 76 76  Resp: 18 17  Temp: 97.7 F (36.5 C) (!) 97.5 F (36.4 C)  SpO2: 98% 97%   No complaints Discussed with wife over phone  General: No acute distress. Cardiovascular: RRR Lungs: unlabored Abdomen: Soft, nontender, nondistended Neurological: Alert , quadriplegia Extremities: No clubbing or cyanosis. No edema. contractures  Discharge Instructions   Discharge Instructions     [facility] pharmacist to adjust dose for Vancomycin, Aminoglycosides and other anti-infective therapies as requested by physician.   Complete by: As directed    [facility] to provide Cath Flo 2mg    Complete by: As directed    Administer for PICC line occlusion and as ordered by physician for other access device issues.   Ambulatory referral to Hematology / Oncology   Complete by: As directed    Anaphylaxis Kit: Provided to treat any anaphylactic reaction to the medication being provided to the patient if First Dose or when requested by physician   Complete by: As directed    Epinephrine 1mg /ml vial / amp: Administer 0.3mg  (0.31ml) subcutaneously once for moderate to severe anaphylaxis, nurse to call physician and pharmacy when reaction occurs and call 911 if needed for immediate care   Diphenhydramine 50mg /ml IV vial: Administer 25-50mg  IV/IM PRN for first dose reaction, rash, itching, mild reaction, nurse to call physician and pharmacy when reaction occurs   Sodium Chloride 0.9% NS IV: Administer if needed for hypovolemic blood pressure drop or as ordered by physician after call to physician with anaphylactic reaction   Call MD for:  difficulty breathing, headache or visual disturbances   Complete by: As directed    Call MD for:  extreme fatigue   Complete by: As directed    Call MD for:  hives   Complete by: As directed    Call MD for:   persistant dizziness or light-headedness   Complete by: As directed    Call MD for:  persistant nausea and vomiting   Complete by: As directed    Call MD for:  redness, tenderness, or signs of infection (pain, swelling, redness, odor or green/yellow discharge around incision site)   Complete by: As directed    Call MD for:  severe uncontrolled pain   Complete by: As directed    Call MD for:  temperature >100.4   Complete by: As directed    Change dressing on IV access line weekly and PRN   Complete by: As directed    Diet - low sodium heart healthy   Complete by: As directed    Diet - low sodium heart healthy   Complete by: As directed    Discharge instructions   Complete by: As directed    You were seen for shortness of breath.  You were found to have Amaryah Mallen new pulmonary embolism.  Because this occurred while you were on xarelto, we're going to switch you to lovenox and consider this Embry Huss "xarelto failure".  Your ultrasound of your legs did not show any blood clots.  The  ultrasound of your heart is currently pending.  I'll update you based on the results.  I'm going to refer you to hematology as an outpatient to discuss the overall plan for your blood thinner given the need to transition to lovenox with xarelto failure.  We placed Annaliese Saez PICC line for antibiotics as ordered by infectious disease.  Follow up with infectious disease outpatient as planned.  Continue wound care per the wound clinic.  Return for new, recurrent, or worsening symptoms.  Please ask your PCP to request records from this hospitalization so they know what was done and what the next steps will be.   Discharge wound care:   Complete by: As directed    Continue wound care per wound clinic instructions   Discharge wound care:   Complete by: As directed    Per wound clinic   Flush IV access with Sodium Chloride 0.9% and Heparin 10 units/ml or 100 units/ml   Complete by: As directed    Home infusion instructions - [facility]   Complete by: As directed    Instructions: Flush IV access with Sodium Chloride 0.9% and Heparin 10units/ml or 100units/ml   Change dressing on IV access line: Weekly and PRN   Instructions Cath Flo 2mg : Administer for PICC Line occlusion and as ordered by physician for other access device   [facility] pharmacist to adjust dose for: Vancomycin, Aminoglycosides and other anti-infective therapies as requested by physician   Increase activity slowly   Complete by: As directed    Increase activity slowly   Complete by: As directed    Method of administration may be changed at the discretion of home infusion pharmacist based upon assessment of the patient and/or caregiver's ability to self-administer the medication ordered   Complete by: As directed       Allergies as of 05/30/2022   No Known Allergies      Medication List     STOP taking these medications    acetaminophen 325 MG tablet Commonly known as: TYLENOL   Baclofen 5 MG Tabs   bisacodyl 10 MG suppository Commonly known as: DULCOLAX   fludrocortisone 0.1 MG tablet Commonly known as: FLORINEF   lidocaine 5 % Commonly known as: LIDODERM   linezolid 600 MG tablet Commonly known as: ZYVOX   pantoprazole 40 MG tablet Commonly known as: PROTONIX   rivaroxaban 20 MG Tabs tablet Commonly known as: XARELTO   VITAMIN C PO   VITAMIN D3 PO       TAKE these medications    daptomycin  IVPB Commonly known as: CUBICIN Inject 700 mg into the vein daily. Indication: chronic osteomyelitis First Dose: Yes Last Day of Therapy:  07/09/22 Labs - Once weekly:  CBC/D, BMP, and CPK Labs - Every other week:  ESR and CRP Method of administration: IV Push Pull PICC line at the completion of IV antibiotics Method of administration may be changed at the discretion of home infusion pharmacist based upon assessment of the patient and/or caregiver's ability to self-administer the medication ordered.    enoxaparin 100 MG/ML injection Commonly known as: LOVENOX Inject 1 mL (100 mg total) into the skin daily. Start taking on: May 31, 2022   sodium chloride 0.65 % nasal spray Commonly known as: OCEAN Place 1 spray into the nose daily as needed for congestion.               Discharge Care Instructions  (From admission, onward)  Start     Ordered   05/30/22 0000  Discharge wound care:       Comments: Per wound clinic   05/30/22 1609   05/29/22 0000  Change dressing on IV access line weekly and PRN  (Home infusion instructions - [facility] )        05/29/22 1026   05/29/22 0000  Discharge wound care:       Comments: Continue wound care per wound clinic instructions   05/29/22 1026           No Known Allergies    The results of significant diagnostics from this hospitalization (including imaging, microbiology, ancillary and laboratory) are listed below for reference.    Significant Diagnostic Studies: ECHOCARDIOGRAM COMPLETE  Result Date: 05/30/2022    ECHOCARDIOGRAM REPORT   Patient Name:   JADIEL SCHMIEDER Date of Exam: 05/30/2022 Medical Rec #:  [id number]    Height:       68.0 in Accession #:    [id number]   Weight:       147.3 lb Date of Birth:  August 29, 1945    BSA:          1.794 m Patient Age:    76 years     BP:           104/59 mmHg Patient Gender: M            HR:           75 bpm. Exam Location:  Inpatient Procedure: 2D Echo Indications:    pulmonary embolus  History:        Patient has prior history of Echocardiogram examinations, most                 recent 05/05/2021. Arrythmias:Atrial Fibrillation and Bradycardia;                 Signs/Symptoms:Dyspnea.  Sonographer:    Delcie Roch RDCS Referring Phys: [phone] Daysy Santini CALDWELL POWELL JR  Sonographer Comments: Restricted left arm. IMPRESSIONS  1. Left ventricular ejection fraction, by estimation, is 60 to 65%. The left ventricle has normal function. The left ventricle has no regional wall motion  abnormalities. Left ventricular diastolic parameters were normal.  2. Right ventricular systolic function is mildly reduced. The right ventricular size is mildly enlarged. There is normal pulmonary artery systolic pressure.  3. The mitral valve is normal in structure. No evidence of mitral valve regurgitation. No evidence of mitral stenosis.  4. The aortic valve is tricuspid. There is mild calcification of the aortic valve. There is mild thickening of the aortic valve. Aortic valve regurgitation is mild. Aortic valve sclerosis is present, with no evidence of aortic valve stenosis.  5. The inferior vena cava is normal in size with greater than 50% respiratory variability, suggesting right atrial pressure of 3 mmHg. FINDINGS  Left Ventricle: Left ventricular ejection fraction, by estimation, is 60 to 65%. The left ventricle has normal function. The left ventricle has no regional wall motion abnormalities. The left ventricular internal cavity size was normal in size. There is  no left ventricular hypertrophy. Left ventricular diastolic parameters were normal. Right Ventricle: The right ventricular size is mildly enlarged. No increase in right ventricular wall thickness. Right ventricular systolic function is mildly reduced. There is normal pulmonary artery systolic pressure. The tricuspid regurgitant velocity  is 2.40 m/s, and with an assumed right atrial pressure of 3 mmHg, the estimated right ventricular systolic pressure is 26.0 mmHg. Left Atrium: Left atrial size  was normal in size. Right Atrium: Right atrial size was normal in size. Pericardium: There is no evidence of pericardial effusion. Mitral Valve: The mitral valve is normal in structure. No evidence of mitral valve regurgitation. No evidence of mitral valve stenosis. Tricuspid Valve: The tricuspid valve is normal in structure. Tricuspid valve regurgitation is mild . No evidence of tricuspid stenosis. Aortic Valve: The aortic valve is tricuspid. There is  mild calcification of the aortic valve. There is mild thickening of the aortic valve. Aortic valve regurgitation is mild. Aortic valve sclerosis is present, with no evidence of aortic valve stenosis. Pulmonic Valve: The pulmonic valve was normal in structure. Pulmonic valve regurgitation is not visualized. No evidence of pulmonic stenosis. Aorta: The aortic root is normal in size and structure. Venous: The inferior vena cava is normal in size with greater than 50% respiratory variability, suggesting right atrial pressure of 3 mmHg. IAS/Shunts: No atrial level shunt detected by color flow Doppler.  LEFT VENTRICLE PLAX 2D LVIDd:         4.40 cm   Diastology LVIDs:         3.10 cm   LV e' medial:    7.30 cm/s LV PW:         1.10 cm   LV E/e' medial:  7.0 LV IVS:        1.10 cm   LV e' lateral:   6.22 cm/s LVOT diam:     2.10 cm   LV E/e' lateral: 8.3 LV SV:         54 LV SV Index:   30 LVOT Area:     3.46 cm  RIGHT VENTRICLE RV S prime:     15.70 cm/s TAPSE (M-mode): 1.5 cm LEFT ATRIUM             Index        RIGHT ATRIUM          Index LA diam:        3.10 cm 1.73 cm/m   RA Area:     9.59 cm LA Vol (A2C):   23.4 ml 13.04 ml/m  RA Volume:   16.10 ml 8.97 ml/m LA Vol (A4C):   19.6 ml 10.92 ml/m LA Biplane Vol: 21.3 ml 11.87 ml/m  AORTIC VALVE LVOT Vmax:   91.80 cm/s LVOT Vmean:  58.800 cm/s LVOT VTI:    0.157 m  AORTA Ao Root diam: 3.20 cm Ao Asc diam:  3.40 cm MV E velocity: 51.40 cm/s  TRICUSPID VALVE MV Franciscojavier Wronski velocity: 71.80 cm/s  TR Peak grad:   23.0 mmHg MV E/Deonte Otting ratio:  0.72        TR Vmax:        240.00 cm/s                             SHUNTS                            Systemic VTI:  0.16 m                            Systemic Diam: 2.10 cm Charlton Haws MD Electronically signed by Charlton Haws MD Signature Date/Time: 05/30/2022/4:13:38 PM    Final    VAS [place] LOWER EXTREMITY VENOUS (DVT)  Result Date: 05/30/2022  Lower Venous DVT Study Patient Name:  TAEJON IRANI  Date of Exam:   05/30/2022 Medical Rec #:  [id number]     Accession #:    [id number] Date of Birth: November 29, 1945     Patient Gender: M Patient Age:   24 years Exam Location:  [facility] Procedure:      VAS [place] LOWER EXTREMITY VENOUS (DVT) Referring Phys: Nayeliz Hipp POWELL JR --------------------------------------------------------------------------------  Indications: Quadriplegia, history of PE and Afib.  Risk Factors: CVA. Anticoagulation: Xarelto. Comparison Study: 12/08/20 - Negative DVT Performing Technologist: [place] Sink Sturdivant RDMS, RVT  Examination Guidelines: Lonny Eisen complete evaluation includes B-mode imaging, spectral Doppler, color Doppler, and power Doppler as needed of all accessible portions of each vessel. Bilateral testing is considered an integral part of Valery Amedee complete examination. Limited examinations for reoccurring indications may be performed as noted. The reflux portion of the exam is performed with the patient in reverse Trendelenburg.  +---------+---------------+---------+-----------+----------+--------------+ RIGHT    CompressibilityPhasicitySpontaneityPropertiesThrombus Aging +---------+---------------+---------+-----------+----------+--------------+ CFV      Full           Yes      Yes                                 +---------+---------------+---------+-----------+----------+--------------+ SFJ      Full                                                        +---------+---------------+---------+-----------+----------+--------------+ FV Prox  Full                                                        +---------+---------------+---------+-----------+----------+--------------+ FV Mid   Full                                                        +---------+---------------+---------+-----------+----------+--------------+ FV DistalFull                                                        +---------+---------------+---------+-----------+----------+--------------+ PFV      Full                                                         +---------+---------------+---------+-----------+----------+--------------+ POP      Full           Yes      Yes                                 +---------+---------------+---------+-----------+----------+--------------+ PTV      Full                                                        +---------+---------------+---------+-----------+----------+--------------+  PERO     Full                                                        +---------+---------------+---------+-----------+----------+--------------+   +---------+---------------+---------+-----------+----------+--------------+ LEFT     CompressibilityPhasicitySpontaneityPropertiesThrombus Aging +---------+---------------+---------+-----------+----------+--------------+ CFV      Full           Yes      Yes                                 +---------+---------------+---------+-----------+----------+--------------+ SFJ      Full                                                        +---------+---------------+---------+-----------+----------+--------------+ FV Prox  Full                                                        +---------+---------------+---------+-----------+----------+--------------+ FV Mid   Full                                                        +---------+---------------+---------+-----------+----------+--------------+ FV DistalFull                                                        +---------+---------------+---------+-----------+----------+--------------+ PFV      Full                                                        +---------+---------------+---------+-----------+----------+--------------+ POP      Full           Yes      Yes                                 +---------+---------------+---------+-----------+----------+--------------+ PTV      Full                                                         +---------+---------------+---------+-----------+----------+--------------+ PERO     Full                                                        +---------+---------------+---------+-----------+----------+--------------+  Summary: BILATERAL: - No evidence of deep vein thrombosis seen in the lower extremities, bilaterally. -No evidence of popliteal cyst, bilaterally.   *See table(s) above for measurements and observations. Electronically signed by Lemar Livings MD on 05/30/2022 at 1:49:50 PM.    Final    CT Angio Chest Pulmonary Embolism (PE) W or WO Contrast  Addendum Date: 05/29/2022   ADDENDUM REPORT: 05/29/2022 15:05 ADDENDUM: The findings of the case were discussed with Dr. Lowell Guitar by telephone at 2:45 p.m. on 05/29/2022 by Dr. Linden Dolin. Following discussion, the above described findings of pulmonary embolism are favored to represent acute pulmonary emboli involving the lobar and segmental branches of the right upper lobe rather than chronic residual thrombus related to the previous CT. ADDENDED IMPRESSION: 1. Examination is positive for acute pulmonary embolism involving the lobar and segmental branches of the right upper lobe. 2. The RV to LV ratio is elevated at 1.4, favored to be secondary to underlying left ventricular hypertrophy rather than acute right heart strain, however correlation with echocardiography may be helpful to better assess. 3. Mild bibasilar atelectasis. Lungs are otherwise clear. 4. Aortic and coronary artery atherosclerosis (ICD10-I70.0). Electronically Signed   By: Duanne Guess D.O.   On: 05/29/2022 15:05   Result Date: 05/29/2022 CLINICAL DATA:  Dyspnea, chronic, chest wall or pleura disease suspected. Known PE EXAM: CT ANGIOGRAPHY CHEST WITH CONTRAST TECHNIQUE: Multidetector CT imaging of the chest was performed using the standard protocol during bolus administration of intravenous contrast. Multiplanar CT image reconstructions and MIPs were obtained to evaluate the  vascular anatomy. RADIATION DOSE REDUCTION: This exam was performed according to the departmental dose-optimization program which includes automated exposure control, adjustment of the mA and/or kV according to patient size and/or use of iterative reconstruction technique. CONTRAST:  75mL OMNIPAQUE IOHEXOL 350 MG/ML SOLN COMPARISON:  CT 05/05/2021 FINDINGS: Cardiovascular: Satisfactory opacification of the pulmonary arteries. Significant interval reduction of the previously seen pulmonary emboli. Small residual thrombus involving lobar and segmental branches of the right upper lobe (series 6, images 91-103). No residual right lower lobe or left-sided pulmonary emboli are seen. The RV to LV ratio is elevated at 1.4, favored to be secondary to underlying left ventricular hypertrophy rather than acute right heart strain. Thoracic aorta is nonaneurysmal. Atherosclerotic calcifications of the aorta and coronary arteries. No pericardial effusion. Right IJ central line terminates within the right atrium. Mediastinum/Nodes: No enlarged mediastinal, hilar, or axillary lymph nodes. Thyroid gland, trachea, and esophagus demonstrate no significant findings. Lungs/Pleura: Mild bibasilar subsegmental atelectasis. Lungs are otherwise clear. No pleural effusion or pneumothorax. Upper Abdomen: No new or acute findings. Musculoskeletal: No new or acute findings. Prominent gynecomastia. Left chest wall loop recorder. Review of the MIP images confirms the above findings. IMPRESSION: 1. Significant interval reduction of the previously seen pulmonary emboli. Small residual thrombus involving lobar and segmental branches of the right upper lobe. No residual right lower lobe or left-sided pulmonary emboli are seen. 2. The RV to LV ratio is elevated at 1.4, favored to be secondary to underlying left ventricular hypertrophy rather than acute right heart strain. 3. Mild bibasilar atelectasis.  Lungs are otherwise clear. 4. Aortic and coronary  artery atherosclerosis (ICD10-I70.0). Electronically Signed: By: Duanne Guess D.O. On: 05/29/2022 13:33   IR Fluoro Guide CV Line Right  Result Date: 05/29/2022 INDICATION: Poor venous access. In need of durable intravenous access for medication administration and blood draws. Patient with history severe left upper extremity contracture relying on his right upper extremity for mobility and  transfer purposes. As such, request made for placement of Vernadine Coombs tunneled jugular approach central venous catheter for durable intravenous access needs. EXAM: ULTRASOUND AND FLUOROSCOPIC GUIDED PLACEMENT OF TUNNELED CENTRAL VENOUS CATHETER MEDICATIONS: None ANESTHESIA/SEDATION: None FLUOROSCOPY TIME:  4 mGy COMPLICATIONS: None immediate. PROCEDURE: Informed written consent was obtained from the patient after Monserratt Knezevic discussion of the risks, benefits, and alternatives to treatment. Questions regarding the procedure were encouraged and answered. The right neck and chest were prepped with chlorhexidine in Tyquisha Sharps sterile fashion, and Dupree Givler sterile drape was applied covering the operative field. Maximum barrier sterile technique with sterile gowns and gloves were used for the procedure. Hilary Milks timeout was performed prior to the initiation of the procedure. After the overlying soft tissues were anesthetized, Cung Masterson small venotomy incision was created and Hildy Nicholl micropuncture kit was utilized to access the internal jugular vein. Real-time ultrasound guidance was utilized for vascular access including the acquisition of Omaya Nieland permanent ultrasound image documenting patency of the accessed vessel. The microwire was utilized to measure appropriate catheter length. The micropuncture sheath was exchanged for Makyle Eslick peel-away sheath over Alora Gorey guidewire. Red Mandt 5 [place] dual lumen tunneled central venous catheter measuring 25 cm was tunneled in Rosary Filosa retrograde fashion from the anterior chest wall to the venotomy incision. The catheter was then placed through the peel-away sheath with tip  ultimately positioned at the superior caval-atrial junction. Final catheter positioning was confirmed and documented with Harlie Ragle spot radiographic image. The catheter aspirates and flushes normally. The catheter was flushed with appropriate volume heparin dwells. The catheter exit site was secured with Kamyia Thomason 0-Prolene retention suture. The venotomy incision was closed with Dermabond and Steri-strips. Dressings were applied. The patient tolerated the procedure well without immediate post procedural complication. FINDINGS: After catheter placement, the tip lies within the superior cavoatrial junction. The catheter aspirates and flushes normally and is ready for immediate use. IMPRESSION: Successful placement of 25 cm dual lumen tunneled central venous catheter via the right internal jugular vein with tip terminating at the superior caval atrial junction. The catheter is ready for immediate use. Electronically Signed   By: Simonne Come M.D.   On: 05/29/2022 12:18   IR US Guide Vasc Access Right  Result Date: 05/29/2022 INDICATION: Poor venous access. In need of durable intravenous access for medication administration and blood draws. Patient with history severe left upper extremity contracture relying on his right upper extremity for mobility and transfer purposes. As such, request made for placement of Emma Schupp tunneled jugular approach central venous catheter for durable intravenous access needs. EXAM: ULTRASOUND AND FLUOROSCOPIC GUIDED PLACEMENT OF TUNNELED CENTRAL VENOUS CATHETER MEDICATIONS: None ANESTHESIA/SEDATION: None FLUOROSCOPY TIME:  4 mGy COMPLICATIONS: None immediate. PROCEDURE: Informed written consent was obtained from the patient after Keric Zehren discussion of the risks, benefits, and alternatives to treatment. Questions regarding the procedure were encouraged and answered. The right neck and chest were prepped with chlorhexidine in Hilja Kintzel sterile fashion, and Hildur Bayer sterile drape was applied covering the operative field. Maximum  barrier sterile technique with sterile gowns and gloves were used for the procedure. Larhonda Dettloff timeout was performed prior to the initiation of the procedure. After the overlying soft tissues were anesthetized, Tyia Binford small venotomy incision was created and Racquelle Hyser micropuncture kit was utilized to access the internal jugular vein. Real-time ultrasound guidance was utilized for vascular access including the acquisition of [place] Cantero permanent ultrasound image documenting patency of the accessed vessel. The microwire was utilized to measure appropriate catheter length. The micropuncture sheath was exchanged for Nickholas Goldston peel-away sheath over Lydiah Pong  guidewire. Bynum Mccullars 5 [place] dual lumen tunneled central venous catheter measuring 25 cm was tunneled in Navid Lenzen retrograde fashion from the anterior chest wall to the venotomy incision. The catheter was then placed through the peel-away sheath with tip ultimately positioned at the superior caval-atrial junction. Final catheter positioning was confirmed and documented with Kenly Xiao spot radiographic image. The catheter aspirates and flushes normally. The catheter was flushed with appropriate volume heparin dwells. The catheter exit site was secured with [place] 0-Prolene retention suture. The venotomy incision was closed with Dermabond and Steri-strips. Dressings were applied. The patient tolerated the procedure well without immediate post procedural complication. FINDINGS: After catheter placement, the tip lies within the superior cavoatrial junction. The catheter aspirates and flushes normally and is ready for immediate use. IMPRESSION: Successful placement of 25 cm dual lumen tunneled central venous catheter via the right internal jugular vein with tip terminating at the superior caval atrial junction. The catheter is ready for immediate use. Electronically Signed   By: Sandi Mariscal M.D.   On: 05/29/2022 12:18   [place] EKG SITE RITE  Result Date: 05/28/2022 If Site Rite image not attached, placement could not be confirmed due to  current cardiac rhythm.  DG Chest Port 1 View  Result Date: 05/28/2022 CLINICAL DATA:  Shortness of breath and weakness EXAM: PORTABLE CHEST 1 VIEW COMPARISON:  Portable exam 1352 hours compared to 09/09/2021 FINDINGS: Loop recorder projects over chest. Upper normal heart size. Mediastinal contours and pulmonary vascularity normal. Subsegmental atelectasis RIGHT base. Lungs otherwise clear. No pulmonary infiltrate, pleural effusion, or pneumothorax. Osseous structures unremarkable. IMPRESSION: Subsegmental atelectasis RIGHT base. Electronically Signed   By: Lavonia Dana M.D.   On: 05/28/2022 14:10   CUP PACEART REMOTE DEVICE CHECK  Result Date: 05/20/2022 ILR summary report received. Battery status OK. Normal device function. No new symptom, tachy, brady, or pause episodes. No new AF episodes. Monthly summary reports and ROV/PRN LA   Microbiology: Recent Results (from the past 240 hour(s))  Culture, blood (routine x 2)     Status: None (Preliminary result)   Collection Time: 05/28/22  2:09 PM   Specimen: BLOOD RIGHT FOREARM  Result Value Ref Range Status   Specimen Description BLOOD RIGHT FOREARM  Final   Special Requests   Final    BOTTLES DRAWN AEROBIC AND ANAEROBIC Blood Culture adequate volume   Culture   Final    NO GROWTH 2 DAYS Performed at [facility], [address], [place], [place] [address]    Report Status PENDING  Incomplete  Culture, blood (routine x 2)     Status: None (Preliminary result)   Collection Time: 05/28/22  3:20 PM   Specimen: BLOOD  Result Value Ref Range Status   Specimen Description BLOOD RIGHT ANTECUBITAL  Final   Special Requests   Final    BOTTLES DRAWN AEROBIC AND ANAEROBIC Blood Culture adequate volume   Culture   Final    NO GROWTH 2 DAYS Performed at [facility], [place] [address], [place], [place] [address]    Report Status PENDING  Incomplete  Resp panel by RT-PCR (RSV, Flu Rashi Granier&B, Covid) Anterior Nasal Swab     Status: None    Collection Time: 05/28/22  3:20 PM   Specimen: Anterior Nasal Swab  Result Value Ref Range Status   SARS Coronavirus 2 by RT PCR NEGATIVE NEGATIVE Final    Comment: (NOTE) SARS-CoV-2 target nucleic acids are NOT DETECTED.  The SARS-CoV-2 RNA is generally detectable in upper  respiratory specimens during the acute phase of infection. The lowest concentration of SARS-CoV-2 viral copies this assay can detect is 138 copies/mL. Shanyiah Conde negative result does not preclude SARS-Cov-2 infection and should not be used as the sole basis for treatment or other patient management decisions. Yuta Cipollone negative result may occur with  improper specimen collection/handling, submission of specimen other than nasopharyngeal swab, presence of viral mutation(s) within the areas targeted by this assay, and inadequate number of viral copies(<138 copies/mL). Omelia Marquart negative result must be combined with clinical observations, patient history, and epidemiological information. The expected result is Negative.  Fact Sheet for Patients:  BloggerCourse.comhttps://www.fda.gov/media/152166/download  Fact Sheet for Healthcare Providers:  SeriousBroker.ithttps://www.fda.gov/media/152162/download  This test is no t yet approved or cleared by the [place] FDA and  has been authorized for detection and/or diagnosis of SARS-CoV-2 by FDA under an Emergency Use Authorization (EUA). This EUA will remain  in effect (meaning this test can be used) for the duration of the COVID-19 declaration under Section 564(b)(1) of the Act, 21 U.S.C.section 360bbb-3(b)(1), unless the authorization is terminated  or revoked sooner.       Influenza Delayne Sanzo by PCR NEGATIVE NEGATIVE Final   Influenza B by PCR NEGATIVE NEGATIVE Final    Comment: (NOTE) The Xpert Xpress SARS-CoV-2/FLU/RSV plus assay is intended as an aid in the diagnosis of influenza from Nasopharyngeal swab specimens and should not be used as Jveon Pound sole basis for treatment. Nasal washings and aspirates are unacceptable for  Xpert Xpress SARS-CoV-2/FLU/RSV testing.  Fact Sheet for Patients: BloggerCourse.comhttps://www.fda.gov/media/152166/download  Fact Sheet for Healthcare Providers: SeriousBroker.ithttps://www.fda.gov/media/152162/download  This test is not yet approved or cleared by the [place] FDA and has been authorized for detection and/or diagnosis of SARS-CoV-2 by FDA under an Emergency Use Authorization (EUA). This EUA will remain in effect (meaning this test can be used) for the duration of the COVID-19 declaration under Section 564(b)(1) of the Act, 21 U.S.C. section 360bbb-3(b)(1), unless the authorization is terminated or revoked.     Resp Syncytial Virus by PCR NEGATIVE NEGATIVE Final    Comment: (NOTE) Fact Sheet for Patients: BloggerCourse.comhttps://www.fda.gov/media/152166/download  Fact Sheet for Healthcare Providers: SeriousBroker.ithttps://www.fda.gov/media/152162/download  This test is not yet approved or cleared by the [place] FDA and has been authorized for detection and/or diagnosis of SARS-CoV-2 by FDA under an Emergency Use Authorization (EUA). This EUA will remain in effect (meaning this test can be used) for the duration of the COVID-19 declaration under Section 564(b)(1) of the Act, 21 U.S.C. section 360bbb-3(b)(1), unless the authorization is terminated or revoked.  Performed at [facility], [address] [place], [place] [address]      Labs: Basic Metabolic Panel: Recent Labs  Lab 05/28/22 1346 05/29/22 0635 05/30/22 0637  NA 136 139 139  K 3.9 3.8 3.5  CL 105 106 106  CO2 23 23 24   GLUCOSE 110* 85 100*  BUN 14 12 11   CREATININE 0.34* 0.38* 0.32*  CALCIUM 9.3 9.2 9.3  MG  --   --  2.4  PHOS  --   --  3.4   Liver Function Tests: Recent Labs  Lab 05/28/22 1346 05/30/22 0637  AST 16 14*  ALT 11 10  ALKPHOS 98 97  BILITOT 1.1 1.0  PROT 6.5 6.0*  ALBUMIN 3.2* 3.0*   No results for input(s): "LIPASE", "AMYLASE" in the last 168 hours. No results for input(s): "AMMONIA" in the last 168  hours. CBC: Recent Labs  Lab 05/28/22 1346 05/29/22 0635 05/30/22 0637  WBC  4.7 5.5 5.3  NEUTROABS 3.4  --  3.5  HGB 12.1* 12.0* 11.6*  HCT 36.4* 37.3* 34.9*  MCV 87.9 89.0 87.5  PLT 234 228 211   Cardiac Enzymes: Recent Labs  Lab 05/28/22 2040  CKTOTAL 60   BNP: BNP (last 3 results) Recent Labs    05/28/22 1409  BNP 30.2    ProBNP (last 3 results) No results for input(s): "PROBNP" in the last 8760 hours.  CBG: No results for input(s): "GLUCAP" in the last 168 hours.     Signed:  Lacretia Nicks MD.  [facility] 05/30/2022, 4:14 PM

## 2022-05-30 NOTE — TOC Transition Note (Signed)
Transition of Care [facility]) - CM/SW Discharge Note   Patient Details  Name: [name] MRN: [id number] Date of Birth: January 03, 1946  [facility] CM/SW Contact:  Ninfa Meeker, RN Phone Number: 05/30/2022, 12:40 PM   Clinical Narrative:     Case manager manager arranged [facility] transportation home for patient. Transport time requested for 2:30pm. RN updated. Family will be updated.   Final next level of care: [place] Barriers to Discharge: No Barriers Identified   Patient Goals and CMS Choice   Choice offered to / list presented to : Patient  Discharge Placement                         Discharge Plan and Services Additional resources added to the After Visit Summary for     Discharge Planning Services: CM Consult Post Acute Care Choice: Home Health                    HH Arranged: RN [facility] Agency: [facility] Date [place]: 05/29/22 Time HH Agency Contacted: 1000 Representative spoke with at [place]: [place] ([place]) Interventions SDOH Screenings   Food Insecurity: No Food Insecurity (05/28/2022)  Housing: Low Risk  (05/28/2022)  Transportation Needs: No Transportation Needs (05/28/2022)  Utilities: Not At Risk (05/28/2022)  Depression (PHQ2-9): Low Risk  (05/20/2022)  Tobacco Use: Low Risk  (05/29/2022)     Readmission Risk Interventions    05/06/2021   11:43 AM  Readmission Risk Prevention Plan  Transportation Screening Complete  PCP or Specialist Appt within 5-7 Days Complete  Home Care Screening Complete  Medication Review (RN CM) Complete

## 2022-05-30 NOTE — Progress Notes (Signed)
  Echocardiogram 2D Echocardiogram has been performed.  [name] 05/30/2022, 4:03 PM

## 2022-05-31 ENCOUNTER — Other Ambulatory Visit: Payer: Self-pay

## 2022-05-31 ENCOUNTER — Emergency Department (HOSPITAL_COMMUNITY): Payer: Medicare Other

## 2022-05-31 ENCOUNTER — Emergency Department (HOSPITAL_COMMUNITY)
Admission: EM | Admit: 2022-05-31 | Discharge: 2022-05-31 | Disposition: A | Discharge status: Home / Self Care | Payer: Medicare Other | Attending: Emergency Medicine | Admitting: Emergency Medicine

## 2022-05-31 ENCOUNTER — Encounter (HOSPITAL_COMMUNITY): Payer: Self-pay

## 2022-05-31 DIAGNOSIS — R251 Tremor, unspecified: Secondary | ICD-10-CM | POA: Diagnosis not present

## 2022-05-31 DIAGNOSIS — R0602 Shortness of breath: Secondary | ICD-10-CM | POA: Insufficient documentation

## 2022-05-31 DIAGNOSIS — R531 Weakness: Secondary | ICD-10-CM | POA: Insufficient documentation

## 2022-05-31 DIAGNOSIS — Z8673 Personal history of transient ischemic attack (TIA), and cerebral infarction without residual deficits: Secondary | ICD-10-CM | POA: Insufficient documentation

## 2022-05-31 LAB — COMPREHENSIVE METABOLIC PANEL
ALT: 11 U/L (ref 0–44)
AST: 15 U/L (ref 15–41)
Albumin: 3 g/dL — ABNORMAL LOW (ref 3.5–5.0)
Alkaline Phosphatase: 92 U/L (ref 38–126)
Anion gap: 8 (ref 5–15)
BUN: 10 mg/dL (ref 8–23)
CO2: 23 mmol/L (ref 22–32)
Calcium: 8.9 mg/dL (ref 8.9–10.3)
Chloride: 106 mmol/L (ref 98–111)
Creatinine, Ser: 0.31 mg/dL — ABNORMAL LOW (ref 0.61–1.24)
GFR, Estimated: >60 mL/min (ref >60)
Glucose, Bld: 96 mg/dL (ref 70–99)
Potassium: 3.6 mmol/L (ref 3.5–5.1)
Sodium: 137 mmol/L (ref 135–145)
Total Bilirubin: 1.2 mg/dL (ref 0.3–1.2)
Total Protein: 5.7 g/dL — ABNORMAL LOW (ref 6.5–8.1)

## 2022-05-31 LAB — CBC WITH DIFFERENTIAL/PLATELET
Abs Immature Granulocytes: 0.02 10*3/uL (ref 0.00–0.07)
Basophils Absolute: 0 10*3/uL (ref 0.0–0.1)
Basophils Relative: 1 %
Eosinophils Absolute: 0.1 10*3/uL (ref 0.0–0.5)
Eosinophils Relative: 1 %
HCT: 34.3 % — ABNORMAL LOW (ref 39.0–52.0)
Hemoglobin: 11.3 g/dL — ABNORMAL LOW (ref 13.0–17.0)
Immature Granulocytes: 0 %
Lymphocytes Relative: 13 %
Lymphs Abs: 0.6 10*3/uL — ABNORMAL LOW (ref 0.7–4.0)
MCH: 29.3 pg (ref 26.0–34.0)
MCHC: 32.9 g/dL (ref 30.0–36.0)
MCV: 88.9 fL (ref 80.0–100.0)
Monocytes Absolute: 0.6 10*3/uL (ref 0.1–1.0)
Monocytes Relative: 11 %
Neutro Abs: 3.8 10*3/uL (ref 1.7–7.7)
Neutrophils Relative %: 74 %
Platelets: 212 10*3/uL (ref 150–400)
RBC: 3.86 MIL/uL — ABNORMAL LOW (ref 4.22–5.81)
RDW: 15.7 % — ABNORMAL HIGH (ref 11.5–15.5)
WBC: 5.1 10*3/uL (ref 4.0–10.5)
nRBC: 0 % (ref 0.0–0.2)

## 2022-05-31 LAB — APTT: aPTT: 32 seconds (ref 24–36)

## 2022-05-31 LAB — LACTIC ACID, PLASMA: Lactic Acid, Venous: 0.8 mmol/L (ref 0.5–1.9)

## 2022-05-31 LAB — PROTIME-INR
INR: 1.1 (ref 0.8–1.2)
Prothrombin Time: 13.6 seconds (ref 11.4–15.2)

## 2022-05-31 LAB — CK: Total CK: 77 U/L (ref 49–397)

## 2022-05-31 LAB — MAGNESIUM: Magnesium: 2.2 mg/dL (ref 1.7–2.4)

## 2022-05-31 MED ORDER — ACETAMINOPHEN 500 MG PO TABS: 1000.0000 mg | ORAL_TABLET | Freq: Once | ORAL | Status: DC

## 2022-05-31 NOTE — ED Notes (Signed)
This RN along with the NT cleaned pt up. Pt had a bowel movement. Pt's brief was changed and extra linens removed from underneath the pt. Will continue to monitor.

## 2022-05-31 NOTE — Discharge Instructions (Signed)
Please follow-up with your doctor in the office.  Please return for worsening or persistent symptoms. 

## 2022-05-31 NOTE — ED Provider Notes (Signed)
[facility] Provider Note   CSN: [id number] Arrival date & time: 05/31/22  0857     History  Chief Complaint  Patient presents with   Tremors   Weakness    [name] is a 77 y.o. male.  77 yo M with a cc of tremors and weakness.  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  Felt like he was having trouble moving the foot at all.  Also had developed a diffuse tremor.  He is currently on IV daptomycin through a PICC line for osteomyelitis of the sacrum.  Was just discharged from the hospital yesterday.  He is short of breath at times but much less than he was when he was hospitalized.  He was also noted to have a PE at his last visit and was started on Lovenox.   Weakness      Home Medications Prior to Admission medications   Medication Sig Start Date End Date Taking? Authorizing Provider  daptomycin (CUBICIN) IVPB Inject 700 mg into the vein daily. Indication: chronic osteomyelitis First Dose: Yes Last Day of Therapy:  07/09/22 Labs - Once weekly:  CBC/D, BMP, and CPK Labs - Every other week:  ESR and CRP Method of administration: IV Push Pull PICC line at the completion of IV antibiotics Method of administration may be changed at the discretion of home infusion pharmacist based upon assessment of the patient and/or caregiver's ability to self-administer the medication ordered. 05/29/22 07/09/22  [name]., MD  enoxaparin (LOVENOX) 100 MG/ML injection Inject 1 mL (100 mg total) into the skin daily. 05/31/22 07/30/22  [name]., MD  sodium chloride (OCEAN) 0.65 % nasal spray Place 1 spray into the nose daily as needed for congestion. Patient not taking: Reported on 05/20/2022    [provider]      Allergies    Patient has no  known allergies.    Review of Systems   Review of Systems  Neurological:  Positive for weakness.    Physical Exam Updated Vital Signs BP 126/74   Pulse 68   Temp 98.6 F (37 C) (Rectal)   Resp 19   Ht 5\' 8"  (1.727 m)   Wt 68 kg   SpO2 95%   BMI 22.81 kg/m  Physical Exam Vitals and nursing note reviewed.  Constitutional:      Appearance: He is well-developed.  HENT:     Head: Normocephalic and atraumatic.  Eyes:     Pupils: Pupils are equal, round, and reactive to light.  Neck:     Vascular: No JVD.  Cardiovascular:     Rate and Rhythm: Normal rate and regular rhythm.     Heart sounds: No murmur heard.    No friction rub. No gallop.  Pulmonary:     Effort: No respiratory distress.     Breath sounds: No wheezing.  Abdominal:     General: There is no distension.     Tenderness: There is no abdominal tenderness. There is no guarding or rebound.  Musculoskeletal:     Cervical back: Normal range of motion and neck supple.     Comments: Contractures to bilateral upper extremities coarse tremor  Skin:    Coloration: Skin is not  pale.     Findings: No rash.  Neurological:     Mental Status: He is alert and oriented to person, place, and time.  Psychiatric:        Behavior: Behavior normal.     ED Results / Procedures / Treatments   Labs (all labs ordered are listed, but only abnormal results are displayed) Labs Reviewed  COMPREHENSIVE METABOLIC PANEL - Abnormal; Notable for the following components:      Result Value   Creatinine, Ser 0.31 (*)    Total Protein 5.7 (*)    Albumin 3.0 (*)    All other components within normal limits  CBC WITH DIFFERENTIAL/PLATELET - Abnormal; Notable for the following components:   RBC 3.86 (*)    Hemoglobin 11.3 (*)    HCT 34.3 (*)    RDW 15.7 (*)    Lymphs Abs 0.6 (*)    All other components within normal limits  CULTURE, BLOOD (ROUTINE X 2)  CULTURE, BLOOD (ROUTINE X 2)  LACTIC ACID, PLASMA  PROTIME-INR  APTT   MAGNESIUM  CK  LACTIC ACID, PLASMA  URINALYSIS, W/ REFLEX TO CULTURE (INFECTION SUSPECTED)    EKG EKG Interpretation  Date/Time:  Saturday May 31 2022 09:34:42 EST Ventricular Rate:  78 PR Interval:    QRS Duration: 90 QT Interval:  362 QTC Calculation: 413 R Axis:   -39 Text Interpretation: Normal sinus rhythm Left axis deviation background noise TECHNICALLY DIFFICULT Otherwise no significant change Confirmed by Deno Etienne [phone]) on 05/31/2022 10:28:06 AM  Radiology DG Chest Port 1 View  Result Date: 05/31/2022 CLINICAL DATA:  Tremors and weakness.  Suspected sepsis. EXAM: PORTABLE CHEST 1 VIEW COMPARISON:  05/28/2022 FINDINGS: The heart size and mediastinal contours are within normal limits. Right jugular catheter is seen with tip overlying the proximal right atrium. Cardiac loop recorder noted. Both lungs are clear. IMPRESSION: No active disease. Electronically Signed   By: Marlaine Hind M.D.   On: 05/31/2022 09:30   ECHOCARDIOGRAM COMPLETE  Result Date: 05/30/2022    ECHOCARDIOGRAM REPORT   Patient Name:   [name] Date of Exam: 05/30/2022 Medical Rec #:  [id number]    Height:       68.0 in Accession #:    [id number]   Weight:       147.3 lb Date of Birth:  12/01/1945    BSA:          1.794 m Patient Age:    78 years     BP:           104/59 mmHg Patient Gender: M            HR:           75 bpm. Exam Location:  Inpatient Procedure: 2D Echo Indications:    pulmonary embolus  History:        Patient has prior history of Echocardiogram examinations, most                 recent 05/05/2021. Arrythmias:Atrial Fibrillation and Bradycardia;                 Signs/Symptoms:Dyspnea.  Sonographer:    Johny Chess RDCS Referring Phys: [phone] A CALDWELL POWELL JR  Sonographer Comments: Restricted left arm. IMPRESSIONS  1. Left ventricular ejection fraction, by estimation, is 60 to 65%. The left ventricle has normal function. The left ventricle has no regional wall motion abnormalities. Left  ventricular diastolic parameters were normal.  2. Right ventricular systolic function is  mildly reduced. The right ventricular size is mildly enlarged. There is normal pulmonary artery systolic pressure.  3. The mitral valve is normal in structure. No evidence of mitral valve regurgitation. No evidence of mitral stenosis.  4. The aortic valve is tricuspid. There is mild calcification of the aortic valve. There is mild thickening of the aortic valve. Aortic valve regurgitation is mild. Aortic valve sclerosis is present, with no evidence of aortic valve stenosis.  5. The inferior vena cava is normal in size with greater than 50% respiratory variability, suggesting right atrial pressure of 3 mmHg. FINDINGS  Left Ventricle: Left ventricular ejection fraction, by estimation, is 60 to 65%. The left ventricle has normal function. The left ventricle has no regional wall motion abnormalities. The left ventricular internal cavity size was normal in size. There is  no left ventricular hypertrophy. Left ventricular diastolic parameters were normal. Right Ventricle: The right ventricular size is mildly enlarged. No increase in right ventricular wall thickness. Right ventricular systolic function is mildly reduced. There is normal pulmonary artery systolic pressure. The tricuspid regurgitant velocity  is 2.40 m/s, and with an assumed right atrial pressure of 3 mmHg, the estimated right ventricular systolic pressure is 26.0 mmHg. Left Atrium: Left atrial size was normal in size. Right Atrium: Right atrial size was normal in size. Pericardium: There is no evidence of pericardial effusion. Mitral Valve: The mitral valve is normal in structure. No evidence of mitral valve regurgitation. No evidence of mitral valve stenosis. Tricuspid Valve: The tricuspid valve is normal in structure. Tricuspid valve regurgitation is mild . No evidence of tricuspid stenosis. Aortic Valve: The aortic valve is tricuspid. There is mild calcification of  the aortic valve. There is mild thickening of the aortic valve. Aortic valve regurgitation is mild. Aortic valve sclerosis is present, with no evidence of aortic valve stenosis. Pulmonic Valve: The pulmonic valve was normal in structure. Pulmonic valve regurgitation is not visualized. No evidence of pulmonic stenosis. Aorta: The aortic root is normal in size and structure. Venous: The inferior vena cava is normal in size with greater than 50% respiratory variability, suggesting right atrial pressure of 3 mmHg. IAS/Shunts: No atrial level shunt detected by color flow Doppler.  LEFT VENTRICLE PLAX 2D LVIDd:         4.40 cm   Diastology LVIDs:         3.10 cm   LV e' medial:    7.30 cm/s LV PW:         1.10 cm   LV E/e' medial:  7.0 LV IVS:        1.10 cm   LV e' lateral:   6.22 cm/s LVOT diam:     2.10 cm   LV E/e' lateral: 8.3 LV SV:         54 LV SV Index:   30 LVOT Area:     3.46 cm  RIGHT VENTRICLE RV S prime:     15.70 cm/s TAPSE (M-mode): 1.5 cm LEFT ATRIUM             Index        RIGHT ATRIUM          Index LA diam:        3.10 cm 1.73 cm/m   RA Area:     9.59 cm LA Vol (A2C):   23.4 ml 13.04 ml/m  RA Volume:   16.10 ml 8.97 ml/m LA Vol (A4C):   19.6 ml 10.92 ml/m LA Biplane Vol: 21.3  ml 11.87 ml/m  AORTIC VALVE LVOT Vmax:   91.80 cm/s LVOT Vmean:  58.800 cm/s LVOT VTI:    0.157 m  AORTA Ao Root diam: 3.20 cm Ao Asc diam:  3.40 cm MV E velocity: 51.40 cm/s  TRICUSPID VALVE MV A velocity: 71.80 cm/s  TR Peak grad:   23.0 mmHg MV E/A ratio:  0.72        TR Vmax:        240.00 cm/s                             SHUNTS                            Systemic VTI:  0.16 m                            Systemic Diam: 2.10 cm Charlton Haws MD Electronically signed by Charlton Haws MD Signature Date/Time: 05/30/2022/4:13:38 PM    Final    VAS [place] LOWER EXTREMITY VENOUS (DVT)  Result Date: 05/30/2022  Lower Venous DVT Study Patient Name:  PATRICIO POPWELL  Date of Exam:   05/30/2022 Medical Rec #: [id number]     Accession #:     [id number] Date of Birth: [birth year]     Patient Gender: M Patient Age:   55 years Exam Location:  [facility] Procedure:      VAS [place] LOWER EXTREMITY VENOUS (DVT) Referring Phys: A POWELL JR --------------------------------------------------------------------------------  Indications: Quadriplegia, history of PE and Afib.  Risk Factors: CVA. Anticoagulation: Xarelto. Comparison Study: 12/08/20 - Negative DVT Performing Technologist: [place] Sink Sturdivant RDMS, RVT  Examination Guidelines: A complete evaluation includes B-mode imaging, spectral Doppler, color Doppler, and power Doppler as needed of all accessible portions of each vessel. Bilateral testing is considered an integral part of a complete examination. Limited examinations for reoccurring indications may be performed as noted. The reflux portion of the exam is performed with the patient in reverse Trendelenburg.  +---------+---------------+---------+-----------+----------+--------------+ RIGHT    CompressibilityPhasicitySpontaneityPropertiesThrombus Aging +---------+---------------+---------+-----------+----------+--------------+ CFV      Full           Yes      Yes                                 +---------+---------------+---------+-----------+----------+--------------+ SFJ      Full                                                        +---------+---------------+---------+-----------+----------+--------------+ FV Prox  Full                                                        +---------+---------------+---------+-----------+----------+--------------+ FV Mid   Full                                                        +---------+---------------+---------+-----------+----------+--------------+  FV DistalFull                                                        +---------+---------------+---------+-----------+----------+--------------+ PFV      Full                                                         +---------+---------------+---------+-----------+----------+--------------+ POP      Full           Yes      Yes                                 +---------+---------------+---------+-----------+----------+--------------+ PTV      Full                                                        +---------+---------------+---------+-----------+----------+--------------+ PERO     Full                                                        +---------+---------------+---------+-----------+----------+--------------+   +---------+---------------+---------+-----------+----------+--------------+ LEFT     CompressibilityPhasicitySpontaneityPropertiesThrombus Aging +---------+---------------+---------+-----------+----------+--------------+ CFV      Full           Yes      Yes                                 +---------+---------------+---------+-----------+----------+--------------+ SFJ      Full                                                        +---------+---------------+---------+-----------+----------+--------------+ FV Prox  Full                                                        +---------+---------------+---------+-----------+----------+--------------+ FV Mid   Full                                                        +---------+---------------+---------+-----------+----------+--------------+ FV DistalFull                                                        +---------+---------------+---------+-----------+----------+--------------+  PFV      Full                                                        +---------+---------------+---------+-----------+----------+--------------+ POP      Full           Yes      Yes                                 +---------+---------------+---------+-----------+----------+--------------+ PTV      Full                                                         +---------+---------------+---------+-----------+----------+--------------+ PERO     Full                                                        +---------+---------------+---------+-----------+----------+--------------+     Summary: BILATERAL: - No evidence of deep vein thrombosis seen in the lower extremities, bilaterally. -No evidence of popliteal cyst, bilaterally.   *See table(s) above for measurements and observations. Electronically signed by Lemar Livings MD on 05/30/2022 at 1:49:50 PM.    Final    CT Angio Chest Pulmonary Embolism (PE) W or WO Contrast  Addendum Date: 05/29/2022   ADDENDUM REPORT: 05/29/2022 15:05 ADDENDUM: The findings of the case were discussed with Dr. Lowell Guitar by telephone at 2:45 p.m. on 05/29/2022 by Dr. Linden Dolin. Following discussion, the above described findings of pulmonary embolism are favored to represent acute pulmonary emboli involving the lobar and segmental branches of the right upper lobe rather than chronic residual thrombus related to the previous CT. ADDENDED IMPRESSION: 1. Examination is positive for acute pulmonary embolism involving the lobar and segmental branches of the right upper lobe. 2. The RV to LV ratio is elevated at 1.4, favored to be secondary to underlying left ventricular hypertrophy rather than acute right heart strain, however correlation with echocardiography may be helpful to better assess. 3. Mild bibasilar atelectasis. Lungs are otherwise clear. 4. Aortic and coronary artery atherosclerosis (ICD10-I70.0). Electronically Signed   By: Duanne Guess D.O.   On: 05/29/2022 15:05   Result Date: 05/29/2022 CLINICAL DATA:  Dyspnea, chronic, chest wall or pleura disease suspected. Known PE EXAM: CT ANGIOGRAPHY CHEST WITH CONTRAST TECHNIQUE: Multidetector CT imaging of the chest was performed using the standard protocol during bolus administration of intravenous contrast. Multiplanar CT image reconstructions and MIPs were obtained to evaluate the  vascular anatomy. RADIATION DOSE REDUCTION: This exam was performed according to the departmental dose-optimization program which includes automated exposure control, adjustment of the mA and/or kV according to patient size and/or use of iterative reconstruction technique. CONTRAST:  35mL OMNIPAQUE IOHEXOL 350 MG/ML SOLN COMPARISON:  CT 05/05/2021 FINDINGS: Cardiovascular: Satisfactory opacification of the pulmonary arteries. Significant interval reduction of the previously seen pulmonary emboli. Small residual thrombus involving lobar and segmental branches of the right upper lobe (series 6, images 91-103). No residual  right lower lobe or left-sided pulmonary emboli are seen. The RV to LV ratio is elevated at 1.4, favored to be secondary to underlying left ventricular hypertrophy rather than acute right heart strain. Thoracic aorta is nonaneurysmal. Atherosclerotic calcifications of the aorta and coronary arteries. No pericardial effusion. Right IJ central line terminates within the right atrium. Mediastinum/Nodes: No enlarged mediastinal, hilar, or axillary lymph nodes. Thyroid gland, trachea, and esophagus demonstrate no significant findings. Lungs/Pleura: Mild bibasilar subsegmental atelectasis. Lungs are otherwise clear. No pleural effusion or pneumothorax. Upper Abdomen: No new or acute findings. Musculoskeletal: No new or acute findings. Prominent gynecomastia. Left chest wall loop recorder. Review of the MIP images confirms the above findings. IMPRESSION: 1. Significant interval reduction of the previously seen pulmonary emboli. Small residual thrombus involving lobar and segmental branches of the right upper lobe. No residual right lower lobe or left-sided pulmonary emboli are seen. 2. The RV to LV ratio is elevated at 1.4, favored to be secondary to underlying left ventricular hypertrophy rather than acute right heart strain. 3. Mild bibasilar atelectasis.  Lungs are otherwise clear. 4. Aortic and coronary  artery atherosclerosis (ICD10-I70.0). Electronically Signed: By: Duanne Guess D.O. On: 05/29/2022 13:33    Procedures Procedures    Medications Ordered in ED Medications  acetaminophen (TYLENOL) tablet 1,000 mg (has no administration in time range)    ED Course/ Medical Decision Making/ A&P                             Medical Decision Making Amount and/or Complexity of Data Reviewed Labs: ordered. Radiology: ordered. ECG/medicine tests: ordered.  Risk OTC drugs.   77 yo M with a chief complaints that have tremor and fatigue.  He was just discharged yesterday, had a PICC line placed for sacral osteomyelitis as well as was transitioned from Xarelto to Lovenox for pulmonary embolism.  He tells me that he normally is able to move his right foot and has struggled to do so today.  Also feels like he has weakness in his arms that is new from baseline.  Tremor could be due to fever.  Will obtain a rectal temperature.  Blood work to assess for acute complication.  Reassess.  CK normal, renal function at baseline.  No significant electrolyte abnormality to explain the tremor.  No leukocytosis.  Afebrile rectally.  I discussed results with the patient.  He actually is feeling much better.  Tells me that sometimes if he holds his arm in a certain position that it starts to cause him to tremor.  He could get stopped earlier but it stopped now.  He would like to go home.  10:51 AM:  I have discussed the diagnosis/risks/treatment options with the patient.  Evaluation and diagnostic testing in the emergency department does not suggest an emergent condition requiring admission or immediate intervention beyond what has been performed at this time.  They will follow up with PCP. We also discussed returning to the ED immediately if new or worsening sx occur. We discussed the sx which are most concerning (e.g., sudden worsening pain, fever, inability to tolerate by mouth) that necessitate immediate  return. Medications administered to the patient during their visit and any new prescriptions provided to the patient are listed below.  Medications given during this visit Medications  acetaminophen (TYLENOL) tablet 1,000 mg (has no administration in time range)     The patient appears reasonably screen and/or stabilized for discharge and I doubt  any other medical condition or other [facility] requiring further screening, evaluation, or treatment in the ED at this time prior to discharge.          Final Clinical Impression(s) / ED Diagnoses Final diagnoses:  Weakness  Tremor    Rx / DC Orders ED Discharge Orders     None         Melene Plan, DO 05/31/22 1051

## 2022-05-31 NOTE — ED Triage Notes (Signed)
Pt to er room number 23 via ems, per ems pt is here for general weakness and some tremors, states that he has hx of osteomyelitis, and stroke with deficits.

## 2022-06-02 ENCOUNTER — Encounter (HOSPITAL_BASED_OUTPATIENT_CLINIC_OR_DEPARTMENT_OTHER): Payer: Medicare Other | Admitting: General Surgery

## 2022-06-02 LAB — CULTURE, BLOOD (ROUTINE X 2)
Culture: NO GROWTH
Culture: NO GROWTH
Special Requests: ADEQUATE
Special Requests: ADEQUATE

## 2022-06-03 ENCOUNTER — Ambulatory Visit (HOSPITAL_COMMUNITY): Payer: Medicare Other

## 2022-06-04 ENCOUNTER — Telehealth: Payer: Self-pay | Admitting: Hematology and Oncology

## 2022-06-04 NOTE — Telephone Encounter (Signed)
Per 1/31 IB, msg left

## 2022-06-05 LAB — CULTURE, BLOOD (ROUTINE X 2)
Culture: NO GROWTH
Culture: NO GROWTH
Special Requests: ADEQUATE
Special Requests: ADEQUATE

## 2022-06-08 ENCOUNTER — Other Ambulatory Visit: Payer: Self-pay

## 2022-06-08 ENCOUNTER — Encounter (HOSPITAL_COMMUNITY): Payer: Self-pay

## 2022-06-08 ENCOUNTER — Inpatient Hospital Stay (HOSPITAL_COMMUNITY)
Admission: EM | Admit: 2022-06-08 | Discharge: 2022-06-11 | DRG: 698 | Disposition: A | Discharge status: Hospice | Payer: Medicare Other | Attending: Internal Medicine | Admitting: Internal Medicine

## 2022-06-08 DIAGNOSIS — K921 Melena: Secondary | ICD-10-CM | POA: Diagnosis present

## 2022-06-08 DIAGNOSIS — Z66 Do not resuscitate: Secondary | ICD-10-CM | POA: Diagnosis present

## 2022-06-08 DIAGNOSIS — T83511A Infection and inflammatory reaction due to indwelling urethral catheter, initial encounter: Principal | ICD-10-CM | POA: Diagnosis present

## 2022-06-08 DIAGNOSIS — A419 Sepsis, unspecified organism: Secondary | ICD-10-CM | POA: Diagnosis not present

## 2022-06-08 DIAGNOSIS — Z711 Person with feared health complaint in whom no diagnosis is made: Secondary | ICD-10-CM

## 2022-06-08 DIAGNOSIS — I4892 Unspecified atrial flutter: Secondary | ICD-10-CM | POA: Diagnosis present

## 2022-06-08 DIAGNOSIS — Z809 Family history of malignant neoplasm, unspecified: Secondary | ICD-10-CM | POA: Diagnosis not present

## 2022-06-08 DIAGNOSIS — N319 Neuromuscular dysfunction of bladder, unspecified: Secondary | ICD-10-CM | POA: Diagnosis not present

## 2022-06-08 DIAGNOSIS — Z832 Family history of diseases of the blood and blood-forming organs and certain disorders involving the immune mechanism: Secondary | ICD-10-CM

## 2022-06-08 DIAGNOSIS — Z1611 Resistance to penicillins: Secondary | ICD-10-CM | POA: Diagnosis present

## 2022-06-08 DIAGNOSIS — M4628 Osteomyelitis of vertebra, sacral and sacrococcygeal region: Secondary | ICD-10-CM | POA: Diagnosis not present

## 2022-06-08 DIAGNOSIS — Z7189 Other specified counseling: Secondary | ICD-10-CM

## 2022-06-08 DIAGNOSIS — A4150 Gram-negative sepsis, unspecified: Secondary | ICD-10-CM | POA: Diagnosis present

## 2022-06-08 DIAGNOSIS — Z7901 Long term (current) use of anticoagulants: Secondary | ICD-10-CM | POA: Diagnosis not present

## 2022-06-08 DIAGNOSIS — Z8249 Family history of ischemic heart disease and other diseases of the circulatory system: Secondary | ICD-10-CM

## 2022-06-08 DIAGNOSIS — Z789 Other specified health status: Secondary | ICD-10-CM

## 2022-06-08 DIAGNOSIS — Z515 Encounter for palliative care: Secondary | ICD-10-CM

## 2022-06-08 DIAGNOSIS — Z7401 Bed confinement status: Secondary | ICD-10-CM | POA: Diagnosis not present

## 2022-06-08 DIAGNOSIS — I444 Left anterior fascicular block: Secondary | ICD-10-CM | POA: Diagnosis present

## 2022-06-08 DIAGNOSIS — K592 Neurogenic bowel, not elsewhere classified: Secondary | ICD-10-CM | POA: Diagnosis present

## 2022-06-08 DIAGNOSIS — I69365 Other paralytic syndrome following cerebral infarction, bilateral: Secondary | ICD-10-CM

## 2022-06-08 DIAGNOSIS — R310 Gross hematuria: Principal | ICD-10-CM | POA: Diagnosis present

## 2022-06-08 DIAGNOSIS — L89154 Pressure ulcer of sacral region, stage 4: Secondary | ICD-10-CM | POA: Diagnosis present

## 2022-06-08 DIAGNOSIS — I959 Hypotension, unspecified: Secondary | ICD-10-CM | POA: Diagnosis not present

## 2022-06-08 DIAGNOSIS — G8250 Quadriplegia, unspecified: Secondary | ICD-10-CM | POA: Diagnosis present

## 2022-06-08 DIAGNOSIS — Z86711 Personal history of pulmonary embolism: Secondary | ICD-10-CM | POA: Diagnosis present

## 2022-06-08 DIAGNOSIS — N390 Urinary tract infection, site not specified: Secondary | ICD-10-CM | POA: Diagnosis not present

## 2022-06-08 DIAGNOSIS — G8220 Paraplegia, unspecified: Secondary | ICD-10-CM | POA: Diagnosis present

## 2022-06-08 DIAGNOSIS — I2699 Other pulmonary embolism without acute cor pulmonale: Secondary | ICD-10-CM | POA: Diagnosis present

## 2022-06-08 DIAGNOSIS — I4891 Unspecified atrial fibrillation: Secondary | ICD-10-CM | POA: Diagnosis present

## 2022-06-08 LAB — BASIC METABOLIC PANEL
Anion gap: 11 (ref 5–15)
BUN: 28 mg/dL — ABNORMAL HIGH (ref 8–23)
CO2: 19 mmol/L — ABNORMAL LOW (ref 22–32)
Calcium: 9.2 mg/dL (ref 8.9–10.3)
Chloride: 104 mmol/L (ref 98–111)
Creatinine, Ser: 0.92 mg/dL (ref 0.61–1.24)
GFR, Estimated: >60 mL/min (ref >60)
Glucose, Bld: 165 mg/dL — ABNORMAL HIGH (ref 70–99)
Potassium: 4.6 mmol/L (ref 3.5–5.1)
Sodium: 134 mmol/L — ABNORMAL LOW (ref 135–145)

## 2022-06-08 LAB — HEPARIN ANTI-XA: Heparin LMW: 0.76 IU/mL

## 2022-06-08 LAB — URINALYSIS, ROUTINE W REFLEX MICROSCOPIC
Bilirubin Urine: NEGATIVE
Glucose, UA: NEGATIVE mg/dL
Ketones, ur: NEGATIVE mg/dL
Nitrite: NEGATIVE
Protein, ur: >=300 mg/dL — AB
RBC / HPF: >50 RBC/hpf (ref 0–5)
Specific Gravity, Urine: 1.007 (ref 1.005–1.030)
pH: 9 — ABNORMAL HIGH (ref 5.0–8.0)

## 2022-06-08 LAB — CBC
HCT: 38 % — ABNORMAL LOW (ref 39.0–52.0)
Hemoglobin: 12 g/dL — ABNORMAL LOW (ref 13.0–17.0)
MCH: 28.8 pg (ref 26.0–34.0)
MCHC: 31.6 g/dL (ref 30.0–36.0)
MCV: 91.3 fL (ref 80.0–100.0)
Platelets: 206 10*3/uL (ref 150–400)
RBC: 4.16 MIL/uL — ABNORMAL LOW (ref 4.22–5.81)
RDW: 16.4 % — ABNORMAL HIGH (ref 11.5–15.5)
WBC: 16 10*3/uL — ABNORMAL HIGH (ref 4.0–10.5)
nRBC: 0 % (ref 0.0–0.2)

## 2022-06-08 LAB — CBC WITH DIFFERENTIAL/PLATELET
Abs Immature Granulocytes: 0.08 10*3/uL — ABNORMAL HIGH (ref 0.00–0.07)
Basophils Absolute: 0 10*3/uL (ref 0.0–0.1)
Basophils Relative: 0 %
Eosinophils Absolute: 0 10*3/uL (ref 0.0–0.5)
Eosinophils Relative: 0 %
HCT: 42.9 % (ref 39.0–52.0)
Hemoglobin: 14.4 g/dL (ref 13.0–17.0)
Immature Granulocytes: 1 %
Lymphocytes Relative: 3 %
Lymphs Abs: 0.4 10*3/uL — ABNORMAL LOW (ref 0.7–4.0)
MCH: 29.8 pg (ref 26.0–34.0)
MCHC: 33.6 g/dL (ref 30.0–36.0)
MCV: 88.6 fL (ref 80.0–100.0)
Monocytes Absolute: 1.3 10*3/uL — ABNORMAL HIGH (ref 0.1–1.0)
Monocytes Relative: 8 %
Neutro Abs: 13.9 10*3/uL — ABNORMAL HIGH (ref 1.7–7.7)
Neutrophils Relative %: 88 %
Platelets: 234 10*3/uL (ref 150–400)
RBC: 4.84 MIL/uL (ref 4.22–5.81)
RDW: 16.3 % — ABNORMAL HIGH (ref 11.5–15.5)
WBC: 15.8 10*3/uL — ABNORMAL HIGH (ref 4.0–10.5)
nRBC: 0 % (ref 0.0–0.2)

## 2022-06-08 LAB — PROTIME-INR
INR: 1.1 (ref 0.8–1.2)
Prothrombin Time: 13.9 seconds (ref 11.4–15.2)

## 2022-06-08 LAB — POC OCCULT BLOOD, ED: Fecal Occult Bld: POSITIVE — AB

## 2022-06-08 LAB — CK: Total CK: 28 U/L — ABNORMAL LOW (ref 49–397)

## 2022-06-08 MED ORDER — SODIUM CHLORIDE 0.9 % IV SOLN
2.0000 g | Freq: Once | INTRAVENOUS | Status: AC
  Administered 2022-06-08: 2 g via INTRAVENOUS
  Filled 2022-06-08: qty 12.5

## 2022-06-08 MED ORDER — SODIUM CHLORIDE 0.9 % IV BOLUS
1000.0000 mL | Freq: Once | INTRAVENOUS | Status: AC
  Administered 2022-06-08: 1000 mL via INTRAVENOUS

## 2022-06-08 MED ORDER — SODIUM CHLORIDE 0.9 % IV SOLN: Freq: Once | INTRAVENOUS | Status: AC

## 2022-06-08 MED ORDER — ONDANSETRON 4 MG PO TBDP
8.0000 mg | ORAL_TABLET | Freq: Once | ORAL | Status: DC
  Filled 2022-06-08: qty 2

## 2022-06-08 MED ORDER — LACTATED RINGERS IV SOLN: INTRAVENOUS | Status: DC

## 2022-06-08 MED ORDER — NYSTATIN 100000 UNIT/GM EX CREA
TOPICAL_CREAM | Freq: Two times a day (BID) | CUTANEOUS | Status: DC
  Filled 2022-06-08 (×2): qty 30

## 2022-06-08 MED ORDER — SODIUM CHLORIDE 0.9 % IV SOLN
10.0000 mg/kg | Freq: Every day | INTRAVENOUS | Status: DC
  Administered 2022-06-08 – 2022-06-09 (×2): 700 mg via INTRAVENOUS
  Filled 2022-06-08 (×4): qty 14

## 2022-06-08 MED ORDER — SODIUM CHLORIDE 0.9 % IV SOLN
8.0000 mg | Freq: Four times a day (QID) | INTRAVENOUS | Status: DC | PRN
  Administered 2022-06-08: 8 mg via INTRAVENOUS
  Filled 2022-06-08: qty 4

## 2022-06-08 NOTE — ED Notes (Signed)
Pt stated his neck felt like it was burning and his lips were burning. Pt's face was erythematous. The hospitalist was at bedside and aware. I stopped the cephepime

## 2022-06-08 NOTE — ED Provider Notes (Signed)
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  His wife noticed decreased output overnight.  Patient has significant bladder distention and discomfort upon arrival here.  A new Foley catheter was placed and over a liter was drained which is bloody.  On exam however he is in a depends and it appears that the blood is coming from the catheter in the penis and not from his rectum.  Wound is clean and does not appear to be newly infected.  Stool was brown.  The heme positive stool is most likely from contaminant from bladder being in the diaper.  Patient's hemoglobin today is stable.  He initially had an episode of hypotension after draining his bladder which improved after a bolus.  He is currently anticoagulated due to recently finding a PE that was thought to be a breakthrough on Xarelto.  Patient does have blood in his catheter but does seem to be improving after drainage.  No significant signs for infection at this time.  Patient did discuss wanting palliative care and reports he would not want a blood transfusion. Patient's lab's were checked and hemoglobin is normal.  He does have a leukocytosis of 15 and repeat check 4 hours after arrival hemoglobin has gone from 14-12 but this is with hydration.  After bladder was drained 1100 mL there is been no continued hematuria.  Do not feel that patient needs discontinuation of his anticoagulation.  Patient's blood pressure has been fluctuating.  It goes from the 80s to the low 100s.  He had been on Florinef in the past but it was discontinued.  Palliative care is meeting with the patient for goals of care.   Blanchie Dessert, MD 06/11/22 [phone]

## 2022-06-08 NOTE — ED Notes (Signed)
I started the pt's atx, then 5 mins later I seen blood cultures were ordered, so I paused the atx then got the blood cultures and restarted the atx afterwards. The atx was only going for 5 mins before I obtained the blood cultures.

## 2022-06-08 NOTE — ED Provider Notes (Signed)
  Physical Exam  BP (!) 77/49   Pulse (!) 56   Temp 97.6 F (36.4 C) (Oral)   Resp 15   Ht 5\' 8"  (1.727 m)   Wt 68 kg   SpO2 96%   BMI 22.79 kg/m   Physical Exam  Procedures  .Critical Care  Performed by: Elgie Congo, MD Authorized by: Elgie Congo, MD   Critical care provider statement:    Critical care time (minutes):  35   Critical care was necessary to treat or prevent imminent or life-threatening deterioration of the following conditions:  Shock and sepsis   Critical care was time spent personally by me on the following activities:  Development of treatment plan with patient or surrogate, discussions with consultants, evaluation of patient's response to treatment, examination of patient, ordering and review of laboratory studies, ordering and review of radiographic studies, ordering and performing treatments and interventions, pulse oximetry, re-evaluation of patient's condition and review of old charts   Care discussed with: admitting provider     ED Course / MDM   Clinical Course as of 06/08/22 1701  Sun Jun 08, 2022  1504 Quadriplegia s/p spinal stroke, wound on daptomycin, recurrent DVT failed Xarelto now on Lovenox. Foley stopped draining, then bleeding from around penis. Foley replaced here. BP has been soft here. Palliative care with patient now.  [VB]  7902 Patient progressively more hypotensive concerning for septic shock but patient wanting to continue IV fluids and antibiotics.  Long discussion with palliative care today.  Sounds like they would like to trial the fluids and antibiotics but if progressively worsening then transition to comfort care.  Does not want ICU admission or pressors.  I spoke with hospitalist will be down to see patient.  I have ordered for daptomycin and cefepime and fluids. [VB]    Clinical Course User Index [VB] Elgie Congo, MD   Medical Decision Making Amount and/or Complexity of Data Reviewed Labs:  ordered.  Risk Prescription drug management. Decision regarding hospitalization.          Elgie Congo, MD 06/08/22 [phone]

## 2022-06-08 NOTE — Care Management (Addendum)
Consult for home hospice, patient requests [facility]. Left confidential voice message with Bradd Canary, Rep for [facility]osp[facility]. [phone] She called back and will follow the patient.

## 2022-06-08 NOTE — ED Notes (Signed)
Pt has a lot of sediment in foley catheter bag. Pt was incontinent of BM. We cleaned pt and applied a clean brief.

## 2022-06-08 NOTE — H&P (Addendum)
History and Physical    [name] KGM:[id number] DOB: July 30, 1945 DOA: 06/08/2022  Referring MD/NP/PA: EDP PCP:  Patient coming from: Home  Chief Complaint: Weakness, blood in urine, stool  HPI: [name] is a 76/M with history of spinal cord stroke in 9/22, resultant quadriplegia, neurogenic bladder, sacral decubitus wounds, sacral osteomyelitis, neurogenic bladder with, chronic Foley, history of pulmonary embolism, A-fib, had chronic sacral osteomyelitis, recently found to have positive bone biopsy growing VRE and finegolida, started on linezolid which was transition to 6-week course of daptomycin on 1/26, he was admitted on 1/24 with acute on chronic shortness of breath which is position dependent, he had a tunneled right IJ central venous catheter placed for antibiotic therapy at the time, also noted to have pulmonary embolism, was transitioned from Xarelto over to Lovenox and discharged home 1/26.  -Today was brought to the ED by his wife due to concerns of some bleeding in his Foley catheter, clogged catheter and scant blood in his stool as well, patient has significant concerns about his very poor quality of life, is considering hospice and comfort focused care.  ED Course: In the ED he was noted to be hypotensive with blood pressure in the 70-80 range, other vitals were stable, labs noted leukocytosis WBC 15.8, creatinine 0.9, clogged Foley had excessive residue, catheter was changed in the ER  Review of Systems: As per HPI otherwise 14 point review of systems negative.   Past Medical History:  Diagnosis Date   CVA (cerebral vascular accident) [facility])     Past Surgical History:  Procedure Laterality Date   IR FLUORO GUIDE CV LINE RIGHT  05/29/2022   IR US GUIDE VASC ACCESS RIGHT  05/29/2022   LOOP RECORDER INSERTION N/A 12/13/2020   Procedure: LOOP RECORDER INSERTION;  Surgeon: [name], MD;  Location: [facility];  Service: Cardiovascular;  Laterality: N/A;    TONSILLECTOMY       reports that he has never smoked. He has never used smokeless tobacco. He reports that he does not currently use alcohol. He reports that he does not use drugs.  No Known Allergies  Family History  Problem Relation Age of Onset   Cancer Mother    Dementia Mother    Heart attack Father    Clotting disorder Father      Prior to Admission medications   Medication Sig Start Date End Date Taking? Authorizing Provider  daptomycin (CUBICIN) IVPB Inject 700 mg into the vein daily. Indication: chronic osteomyelitis First Dose: Yes Last Day of Therapy:  07/09/22 Labs - Once weekly:  CBC/D, BMP, and CPK Labs - Every other week:  ESR and CRP Method of administration: IV Push Pull PICC line at the completion of IV antibiotics Method of administration may be changed at the discretion of home infusion pharmacist based upon assessment of the patient and/or caregiver's ability to self-administer the medication ordered. 05/29/22 07/09/22 Yes [name]., MD  enoxaparin (LOVENOX) 100 MG/ML injection Inject 1 mL (100 mg total) into the skin daily. 05/31/22 07/30/22 Yes [name]., MD  Homeopathic Products [facility] ALLERGY EYE RELIEF OP) Place 1 drop into both eyes 2 (two) times daily as needed (For watery eyes per spouse).   Yes [provider]  sodium chloride (OCEAN) 0.65 % nasal spray Place 1 spray into the nose daily as needed for congestion.   Yes [provider]    Physical Exam: Vitals:   06/08/22 1610 06/08/22 1621 06/08/22 1630 06/08/22  1640  BP: (!) 86/54 (!) 81/57 (!) 81/48 (!) 77/49  Pulse: (!) 59 (!) 59 (!) 128 (!) 56  Resp: 17 (!) 21 (!) 21 15  Temp:  97.6 F (36.4 C)    TempSrc:  Oral    SpO2: 97% 98% 96% 96%  Weight:      Height:          Constitutional: Chronically ill male laying in bed, awake alert oriented x 3 HEENT:no JVD, tunneled central line in right IJ CVS: S1-S2, regular rhythm Lungs: Poor air movement  bilaterally Abdomen: Soft, nontender, bowel sounds present Extremities: Bilateral upper extremity contractures, flaccid lower extremities Skin: Small deep sacral decubitus wound with dressing Neurologic: Quadriplegic with contractures Psychiatric: Flat affect alert and oriented x 3.   Labs on Admission: I have personally reviewed following labs and imaging studies  CBC: Recent Labs  Lab 06/08/22 0832 06/08/22 1157  WBC 15.8* 16.0*  NEUTROABS 13.9*  --   HGB 14.4 12.0*  HCT 42.9 38.0*  MCV 88.6 91.3  PLT 234 841   Basic Metabolic Panel: Recent Labs  Lab 06/08/22 0832  NA 134*  K 4.6  CL 104  CO2 19*  GLUCOSE 165*  BUN 28*  CREATININE 0.92  CALCIUM 9.2   GFR: Estimated Creatinine Clearance: 67.8 mL/min (by C-G formula based on SCr of 0.92 mg/dL). Liver Function Tests: No results for input(s): "AST", "ALT", "ALKPHOS", "BILITOT", "PROT", "ALBUMIN" in the last 168 hours. No results for input(s): "LIPASE", "AMYLASE" in the last 168 hours. No results for input(s): "AMMONIA" in the last 168 hours. Coagulation Profile: Recent Labs  Lab 06/08/22 0832  INR 1.1   Cardiac Enzymes: No results for input(s): "CKTOTAL", "CKMB", "CKMBINDEX", "TROPONINI" in the last 168 hours. BNP (last 3 results) No results for input(s): "PROBNP" in the last 8760 hours. HbA1C: No results for input(s): "HGBA1C" in the last 72 hours. CBG: No results for input(s): "GLUCAP" in the last 168 hours. Lipid Profile: No results for input(s): "CHOL", "HDL", "LDLCALC", "TRIG", "CHOLHDL", "LDLDIRECT" in the last 72 hours. Thyroid Function Tests: No results for input(s): "TSH", "T4TOTAL", "FREET4", "T3FREE", "THYROIDAB" in the last 72 hours. Anemia Panel: No results for input(s): "VITAMINB12", "FOLATE", "FERRITIN", "TIBC", "IRON", "RETICCTPCT" in the last 72 hours. Urine analysis:    Component Value Date/Time   COLORURINE RED (A) 06/08/2022 0832   APPEARANCEUR CLOUDY (A) 06/08/2022 0832   LABSPEC  1.007 06/08/2022 0832   PHURINE 9.0 (H) 06/08/2022 0832   GLUCOSEU NEGATIVE 06/08/2022 0832   HGBUR MODERATE (A) 06/08/2022 0832   BILIRUBINUR NEGATIVE 06/08/2022 0832   KETONESUR NEGATIVE 06/08/2022 0832   PROTEINUR >=300 (A) 06/08/2022 0832   NITRITE NEGATIVE 06/08/2022 0832   LEUKOCYTESUR MODERATE (A) 06/08/2022 0832   Sepsis Labs: @LABRCNTIP (procalcitonin:4,lacticidven:4) ) Recent Results (from the past 240 hour(s))  Blood Culture (routine x 2)     Status: None   Collection Time: 05/31/22  9:52 AM   Specimen: BLOOD RIGHT WRIST  Result Value Ref Range Status   Specimen Description BLOOD RIGHT WRIST  Final   Special Requests   Final    BOTTLES DRAWN AEROBIC AND ANAEROBIC Blood Culture adequate volume   Culture   Final    NO GROWTH 5 DAYS Performed at [facility], [address] [place], [place] [address]    Report Status 06/05/2022 FINAL  Final  Blood Culture (routine x 2)     Status: None   Collection Time: 05/31/22 10:25 AM   Specimen: BLOOD LEFT  WRIST  Result Value Ref Range Status   Specimen Description BLOOD LEFT WRIST  Final   Special Requests   Final    BOTTLES DRAWN AEROBIC AND ANAEROBIC Blood Culture adequate volume   Culture   Final    NO GROWTH 5 DAYS Performed at [facility], [address], [place], [place] [address]    Report Status 06/05/2022 FINAL  Final     Radiological Exams on Admission: No results found.  EKG: Independently reviewed.  Sinus rhythm, no acute ST-T wave changes  Assessment/Plan     Sepsis (HCC)   Complicated UTI (urinary tract infection) -Potential source is urinary as he had a clogged catheter which was changed, check urine cultures, -Currently on daptomycin add cefepime, IV fluids and midodrine -He also has a tunneled catheter, follow-up blood cultures -Sacral wound is always a potential source,, already on antibiotics as noted -Patient declines pressors/ICU transfer etc, palliative care is following, plan for  conservative management only with goal of stabilization and potentially transition to hospice at discharge, should he become unstable he is wanting to transition to comfort measures  Hematuria Neurogenic bladder -Mild, would continue Eliquis for now, if gross hematuria persists could hold Eliquis for 48 hours -Infection, clogged catheter could be contributing in the background of anticoagulation  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, acute on chronic -Seen by infectious disease, started on 6-week course of IV daptomycin to be completed 2/26 -Continue wound care  History of pulmonary embolism -Recently switched from Xarelto to Lovenox, continue same  Spinal stroke, quadriplegia  Hematochezia -Could have been bleeding from the wound as opposed to true hematochezia, regardless he is not a candidate for GI/endoscopic evaluation this has been discussed with patient and wife  Goals/ethics, this is a this is a chronically ill debilitated quadriplegic male with stage IV decubitus wound, sacral osteomyelitis, neurogenic bladder, significant chronic disease burden and poor quality of life -His goals are for comfort focused care, his wife wishes gentle treatment for sepsis -Continue discussions with palliative care, patient is considering hospice services upon discharge if he stabilizes   DVT prophylaxis: Full dose Lovenox Code Status: DNR Family Communication: Wife at bedside Disposition Plan: Home likely with hospice Consults called: Palliative care Admission status: Inpatient  Domenic Polite MD [facility]   06/08/2022, 5:36 PM

## 2022-06-08 NOTE — ED Notes (Signed)
Informed Dr Jeani Hawking of pt's hypotension and obtained orders for bolus of fluids and it was implemented

## 2022-06-08 NOTE — ED Triage Notes (Addendum)
Pt arrived via [facility] from home for rectal bleeding and bleeding at the urinary catheter site. Per EMS, pt's wife has noticed decreased urine output started last night. Pt has moderate amount of bright red blood coming from tip of penis around cath

## 2022-06-08 NOTE — Discharge Instructions (Addendum)
Follow up with your primary care physician.  Your blood counts were normal despite you bleeding in your urine.  If you think you are allergic to your antibiotic, please discuss with your infectious disease doctor before continuing this medication.  Your blood thinner is needed because of the blood clot in your lung. However, you can discuss with your doctor about whether or not you should continue this medication.

## 2022-06-08 NOTE — ED Provider Notes (Signed)
[facility] Provider Note  CSN: [id number] Arrival date & time: 06/08/22  5188  History  Chief Complaint  Patient presents with   GI Bleeding   [name] is a 77 y.o. male.  [name] is a 77 year old man here today with concern for rectal bleeding and hematuria. PMH includes spinal CVA resulting in quadriplegia with associated neurogenic bowel and bladder, Foley catheter in place, A-fib with RVR and PE on Xarelto, sacral osteomyelitis, and decubitus sacral ulcer stage IV. Was recently admitted to [facility] 1/24 - 1/26 for sacral osteomyelitis. Has right tunneled central line placed 1/25 by IR for long-term antibiotics, currently IV daptomycin. Found to have acute PE 1/25, was transitioned from Xarelto to Lovenox. Seen again in ED 1/27 1 day after discharge for weakness and tremors.  Had no acute findings at that time, discharged home.  Today, patient reports blood per stool and urinary catheter since last night. He has been feeling quite weak. He asks me to call his wife for further details. Called wife [name] at [phone].  She reports since returning home from the hospital, had 2 days of constipation resolved by 8 ounces of prune juice.  Then yesterday was found to have bright red blood in stool x 2, worsening with each bowel movement.  She also noted a pink tinge to his urine that darkened from yesterday to today.  Decreased Foley output with lots of sediment per both wife and patient.  Wife also reports that he overall looks quite poor by the time she called the ambulance, seen he was complaining of being hot but felt cold to the touch, was nauseous without vomiting, and appeared "out of focus", like wife had to call his name to get him to focus on her.   Wife reports patient has repeatedly told her he is ready to die. She says that "frankly, he is only alive because we are keeping him alive". She reports he currently makes his own  health care decisions (seems appropriate given A&Ox4, normal thought and judgment in ED), but that she has power of attorney to make decisions if need be.  Patient did not want her to call the ambulance this morning, but is also afraid of leaving his wife when he dies.    Home Medications Prior to Admission medications   Medication Sig Start Date End Date Taking? Authorizing Provider  daptomycin (CUBICIN) IVPB Inject 700 mg into the vein daily. Indication: chronic osteomyelitis First Dose: Yes Last Day of Therapy:  07/09/22 Labs - Once weekly:  CBC/D, BMP, and CPK Labs - Every other week:  ESR and CRP Method of administration: IV Push Pull PICC line at the completion of IV antibiotics Method of administration may be changed at the discretion of home infusion pharmacist based upon assessment of the patient and/or caregiver's ability to self-administer the medication ordered. 05/29/22 07/09/22  [name]., MD  enoxaparin (LOVENOX) 100 MG/ML injection Inject 1 mL (100 mg total) into the skin daily. 05/31/22 07/30/22  [name]., MD  sodium chloride (OCEAN) 0.65 % nasal spray Place 1 spray into the nose daily as needed for congestion. Patient not taking: Reported on 05/20/2022    [provider]     Allergies    Patient has no known allergies.    Review of Systems   Review of Systems  Constitutional:  Positive for chills, diaphoresis and fatigue.  Respiratory:  Negative for cough, choking  and shortness of breath.   Cardiovascular:  Negative for chest pain and leg swelling.  Gastrointestinal:  Positive for blood in stool, constipation and nausea. Negative for abdominal pain and vomiting.  Genitourinary:  Positive for difficulty urinating and hematuria.   Physical Exam Updated Vital Signs BP (!) 86/48   Pulse 73   Temp 97.7 F (36.5 C) (Oral)   Resp (!) 21   Ht 5\' 8"  (1.727 m)   Wt 68 kg   SpO2 97%   BMI 22.79 kg/m  Physical Exam Exam conducted with a  chaperone present.  Constitutional:      General: He is not in acute distress.    Appearance: He is normal weight. He is not toxic-appearing.  HENT:     Head: Normocephalic.  Cardiovascular:     Rate and Rhythm: Normal rate and regular rhythm.     Heart sounds: No murmur heard. Pulmonary:     Effort: Pulmonary effort is normal. No respiratory distress.     Breath sounds: Normal breath sounds. No stridor. No wheezing or rhonchi.  Abdominal:     Palpations: Abdomen is soft.     Comments: Bladder firm and to level of umbilicus  Genitourinary:    Pubic Area: Rash present.     Penis: Discharge (Blood discharge from meatus) present.      Rectum: Guaiac result positive (But likely contaminated from urine, given diaper and leaking catheter). No tenderness or anal fissure. Abnormal anal tone (Large anal diameter, known neurogenic bowel and bladder).  Skin:    General: Skin is warm.     Capillary Refill: Capillary refill takes less than 2 seconds.  Neurological:     Mental Status: He is alert.    ED Results / Procedures / Treatments   Labs (all labs ordered are listed, but only abnormal results are displayed) Labs Reviewed  CBC WITH DIFFERENTIAL/PLATELET - Abnormal; Notable for the following components:      Result Value   WBC 15.8 (*)    RDW 16.3 (*)    Neutro Abs 13.9 (*)    Lymphs Abs 0.4 (*)    Monocytes Absolute 1.3 (*)    Abs Immature Granulocytes 0.08 (*)    All other components within normal limits  BASIC METABOLIC PANEL - Abnormal; Notable for the following components:   Sodium 134 (*)    CO2 19 (*)    Glucose, Bld 165 (*)    BUN 28 (*)    All other components within normal limits  URINALYSIS, ROUTINE W REFLEX MICROSCOPIC - Abnormal; Notable for the following components:   Color, Urine RED (*)    APPearance CLOUDY (*)    pH 9.0 (*)    Hgb urine dipstick MODERATE (*)    Protein, ur >=300 (*)    Leukocytes,Ua MODERATE (*)    Bacteria, UA RARE (*)    All other  components within normal limits  CBC - Abnormal; Notable for the following components:   WBC 16.0 (*)    RBC 4.16 (*)    Hemoglobin 12.0 (*)    HCT 38.0 (*)    RDW 16.4 (*)    All other components within normal limits  POC OCCULT BLOOD, ED - Abnormal; Notable for the following components:   Fecal Occult Bld POSITIVE (*)    All other components within normal limits  URINE CULTURE  PROTIME-INR  HEPARIN ANTI-XA   EKG EKG Interpretation  Date/Time:  Sunday June 08 2022 10:13:55 EST Ventricular Rate:  62  PR Interval:  70 QRS Duration: 111 QT Interval:  436 QTC Calculation: 443 R Axis:   -62 Text Interpretation: Sinus rhythm Short PR interval Consider right atrial enlargement LAD, consider left anterior fascicular block Confirmed by Gwyneth Sprout ([id number]) on 06/08/2022 11:24:52 AM  Radiology No results found.  Procedures Procedures   Medications Ordered in ED Medications  nystatin cream (MYCOSTATIN) ( Topical Given 06/08/22 1050)  ondansetron (ZOFRAN) 8 mg in sodium chloride 0.9 % 50 mL IVPB (0 mg Intravenous Stopped 06/08/22 1035)  sodium chloride 0.9 % bolus 1,000 mL (0 mLs Intravenous Stopped 06/08/22 1007)  sodium chloride 0.9 % bolus 1,000 mL (0 mLs Intravenous Stopped 06/08/22 1151)  0.9 %  sodium chloride infusion ( Intravenous New Bag/Given 06/08/22 1152)   ED Course/ Medical Decision Making/ A&P Clinical Course as of 06/08/22 1507  Sun Jun 08, 2022  1504 Quadriplegia s/p spinal stroke, wound on daptomycin, recurrent DVT failed Xarelto now on Lovenox. Foley stopped draining, then bleeding from around penis. Foley replaced here. BP has been soft here. Palliative care with patient now.  [VB]    Clinical Course User Index [VB] Mardene Sayer, MD   Medical Decision Making Patient arrived from home via EMS for suspected urinary and rectal bleeding.  In ED, vital signs stable, normal SpO2 on room air.  Patient reports being weak.  FOBT positive, but suspect contamination  from hematuria given diaper and leaking catheter.  Waiting CBC, BMP, PT/INR, UA, and Lovenox level.  Patient reports he would decline blood transfusion even if hemoglobin below threshold.  He has DNR paperwork at bedside.  Wife over phone relayed patient is "ready to die".  Palliative consult placed.  Plan for further discussion patient wishes pending lab work.  9:05 am: RN notified patient has become hypotensive to MAP 55, BP 60s/40s. Patient reports "feeling weird", can only specify difficulty focusing eyes. 1L NS bolus initiated, EF normal. He reports while he is leaning towards palliative or hospice at home, he would want to be admitted today to get his hematuria worked up and stabilized. Palliative care returned call, discussed case. They will have a palliative provider see him as soon as they're able.   9:20 am: Pressures increasing gradually, BP now 70s/50s. Patient reports feeling improved. Zofran order switched from PO to IV.   9:45 am: Pressures continue to improve, now 90s/60s. Continues to feel improved. Continue to monitor.   10:45 am: BP drifting back down, will start second 1L NS bolus. Ordered NS infusion to begin afterwards. Plan to recheck Hgb and urine color around noon. Wife should be here by then. After those results return, will discuss with patient and wife.   12:45 pm: BP holding stable with IV fluids.  Repeat CBC returned slightly reduced, hemoglobin down 12.0.  Appears to be at his baseline.  Suspect initial hemoglobin from hemoconcentration.  Urine in Foley catheter now pink-tinged but not frankly red.  2:30 pm: Wife and palliative here for palliative meeting. All medical questions answered. Wife and patient thankful. Palliative provider Amber NP will continue GOC conversation. Patient case handed off to oncoming provider who will place orders for admission with hospice vs discharge depending on outcome of palliative conversation. If admitted, could consider restarting of home  fludrocortisone to assist with soft blood pressures.   Amount and/or Complexity of Data Reviewed Independent Historian: spouse Labs: ordered. ECG/medicine tests: ordered.  Risk Prescription drug management.   Final Clinical Impression(s) / ED Diagnoses Final diagnoses:  None  Rx / DC Orders ED Discharge Orders     None      Ezequiel Essex, MD   Ezequiel Essex, MD 06/08/22 [place], MD 06/11/22 [phone]

## 2022-06-08 NOTE — ED Notes (Signed)
Dr Jeani Hawking is aware of pt's low bp is at bedside

## 2022-06-08 NOTE — Progress Notes (Signed)
Pharmacy Antibiotic Note  [name] is a 77 y.o. male for which pharmacy has been consulted for  daptomycin  dosing for  chronic sacral osteomyelitis; Sepsis .  Patient with a history of paraplegia after spinal stroke, chronic Foley catheter placement, sacral wound currently on daptomycin OPAT (Original end date 07/01/22). Last daptomycin dose PTA 2/3 ~1400. Patient presenting with concern for blood coming from his bladder and rectum.  SCr 0.92 WBC 16; T 97.6; HR 65; RR 25  Plan: Continue daptomycin 10mg /kg q24hr as previously started by ID Cefepime per MD Trend WBC, Fever, Renal function F/u cultures, clinical course, WBC De-escalate when able F/u ID recommendations  Height: 5\' 8"  (172.7 cm) Weight: 68 kg (149 lb 14.6 oz) IBW/kg (Calculated) : 68.4  Temp (24hrs), Avg:97.6 F (36.4 C), Min:97.4 F (36.3 C), Max:97.7 F (36.5 C)  Recent Labs  Lab 06/08/22 0832 06/08/22 1157  WBC 15.8* 16.0*  CREATININE 0.92  --     Estimated Creatinine Clearance: 67.8 mL/min (by C-G formula based on SCr of 0.92 mg/dL).    No Known Allergies  Microbiology results: Pending  Thank you for allowing pharmacy to be a part of this patient's care.  Lorelei Pont, PharmD, BCPS 06/08/2022 5:04 PM ED Clinical Pharmacist -  [phone]

## 2022-06-08 NOTE — ED Notes (Signed)
ED TO INPATIENT HANDOFF REPORT  ED Nurse Name and Phone #: Lorrin Goodell   [phone]  S Name/Age/Gender [name] 77 y.o. male Room/Bed: 015C/015C  Code Status   Code Status: DNR  Home/SNF/Other Skilled nursing facility Patient oriented to: self Is this baseline? Yes   Triage Complete: Triage complete  Chief Complaint Sepsis [facility]) [A41.9]  Triage Note Pt arrived via [facility] from home for rectal bleeding and bleeding at the urinary catheter site. Per EMS, pt's wife has noticed decreased urine output started last night. Pt has moderate amount of bright red blood coming from tip of penis around cath   Allergies No Known Allergies  Level of Care/Admitting Diagnosis ED Disposition     ED Disposition  Admit   Condition  --   [facility] Area: [place] [[id number]]  Level of Care: Telemetry Medical [104]  May admit patient to [facility] or [facility] if equivalent level of care is available:: No  Covid Evaluation: Asymptomatic - no recent exposure (last 10 days) testing not required  Diagnosis: Sepsis [facility]) [[id number]]  Admitting Physician: Caban, Stuart  Attending Physician: Domenic Polite [[id number]]  Certification:: I certify this patient will need inpatient services for at least 2 midnights  Estimated Length of Stay: 3          B Medical/Surgery History Past Medical History:  Diagnosis Date   CVA (cerebral vascular accident) [facility])    Past Surgical History:  Procedure Laterality Date   IR FLUORO GUIDE CV LINE RIGHT  05/29/2022   IR US GUIDE VASC ACCESS RIGHT  05/29/2022   LOOP RECORDER INSERTION N/A 12/13/2020   Procedure: LOOP RECORDER INSERTION;  Surgeon: [name], MD;  Location: [place] CV LAB;  Service: Cardiovascular;  Laterality: N/A;   TONSILLECTOMY       A IV Location/Drains/Wounds Patient Lines/Drains/Airways Status     Active Line/Drains/Airways     Name Placement date Placement time Site Days   Tunneled CVC  Double Lumen (Radiology) 05/29/22 Right Internal jugular 25 cm 05/29/22  0922  -- 10   Urethral Catheter Alana, RN Latex 16 Fr. 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 05/05/21  2230  -- 399   Pressure Injury 09/09/21 Sacrum Stage 4 - Full thickness tissue loss with exposed bone, tendon or muscle. 09/09/21  2200  -- 272   Pressure Injury 09/09/21 Leg Right;Lower;Posterior  red scabbed 09/09/21  2200  -- 272   Wound / Incision (Open or Dehisced) 05/05/21 Incision - Open Tibial Posterior;Right 05/05/21  2230  Tibial  399            Intake/Output Last 24 hours  Intake/Output Summary (Last 24 hours) at 06/08/2022 1749 Last data filed at 06/08/2022 1734 Gross per 24 hour  Intake 2773.48 ml  Output --  Net 2773.48 ml    Labs/Imaging Results for orders placed or performed during the hospital encounter of 06/08/22 (from the past 48 hour(s))  CBC with Differential     Status: Abnormal   Collection Time: 06/08/22  8:32 AM  Result Value Ref Range   WBC 15.8 (H) 4.0 - 10.5 K/uL   RBC 4.84 4.22 - 5.81 MIL/uL   Hemoglobin 14.4 13.0 - 17.0 g/dL   HCT 42.9 39.0 - 52.0 %   MCV 88.6 80.0 - 100.0 fL   MCH 29.8 26.0 - 34.0  pg   MCHC 33.6 30.0 - 36.0 g/dL   RDW 16.3 (H) 11.5 - 15.5 %   Platelets 234 150 - 400 K/uL   nRBC 0.0 0.0 - 0.2 %   Neutrophils Relative % 88 %   Neutro Abs 13.9 (H) 1.7 - 7.7 K/uL   Lymphocytes Relative 3 %   Lymphs Abs 0.4 (L) 0.7 - 4.0 K/uL   Monocytes Relative 8 %   Monocytes Absolute 1.3 (H) 0.1 - 1.0 K/uL   Eosinophils Relative 0 %   Eosinophils Absolute 0.0 0.0 - 0.5 K/uL   Basophils Relative 0 %   Basophils Absolute 0.0 0.0 - 0.1 K/uL   Immature Granulocytes 1 %   Abs Immature Granulocytes 0.08 (H) 0.00 - 0.07 K/uL    Comment: Performed at [facility] [address], [place], [place] [address]  Basic metabolic panel     Status: Abnormal    Collection Time: 06/08/22  8:32 AM  Result Value Ref Range   Sodium 134 (L) 135 - 145 mmol/L   Potassium 4.6 3.5 - 5.1 mmol/L   Chloride 104 98 - 111 mmol/L   CO2 19 (L) 22 - 32 mmol/L   Glucose, Bld 165 (H) 70 - 99 mg/dL    Comment: Glucose reference range applies only to samples taken after fasting for at least 8 hours.   BUN 28 (H) 8 - 23 mg/dL   Creatinine, Ser 0.92 0.61 - 1.24 mg/dL   Calcium 9.2 8.9 - 10.3 mg/dL   GFR, Estimated >60 >60 mL/min    Comment: (NOTE) Calculated using the CKD-EPI Creatinine Equation (2021)    Anion gap 11 5 - 15    Comment: Performed at [facility] [address] [place], [place] [address]  Protime-INR     Status: None   Collection Time: 06/08/22  8:32 AM  Result Value Ref Range   Prothrombin Time 13.9 11.4 - 15.2 seconds   INR 1.1 0.8 - 1.2    Comment: (NOTE) INR goal varies based on device and disease states. Performed at [facility], [place] [address], [place], [place] [address]   Urinalysis, Routine w reflex microscopic -Urine, Catheterized; Indwelling urinary catheter     Status: Abnormal   Collection Time: 06/08/22  8:32 AM  Result Value Ref Range   Color, Urine RED (A) YELLOW    Comment: BIOCHEMICALS MAY BE AFFECTED BY COLOR   APPearance CLOUDY (A) CLEAR   Specific Gravity, Urine 1.007 1.005 - 1.030   pH 9.0 (H) 5.0 - 8.0   Glucose, UA NEGATIVE NEGATIVE mg/dL   Hgb urine dipstick MODERATE (A) NEGATIVE   Bilirubin Urine NEGATIVE NEGATIVE   Ketones, ur NEGATIVE NEGATIVE mg/dL   Protein, ur >=300 (A) NEGATIVE mg/dL   Nitrite NEGATIVE NEGATIVE   Leukocytes,Ua MODERATE (A) NEGATIVE   RBC / HPF >50 0 - 5 RBC/hpf   WBC, UA 21-50 0 - 5 WBC/hpf   Bacteria, UA RARE (A) NONE SEEN   Squamous Epithelial / HPF 0-5 0 - 5 /HPF   Budding Yeast PRESENT    Triple Phosphate Crystal PRESENT     Comment: Performed at [facility], [address], [place], [place] [address]  Low molecular wgt heparin     Status: None   Collection  Time: 06/08/22  8:32 AM  Result Value Ref Range   Heparin LMW 0.76 IU/mL    Comment:        THERAPEUTIC RANGE: DVT,PE,ACS  on LMWH 1 mg/kg q12 at 4 hrs = 0.5-1.2 units/mL. DVT,PE on LMWH 1.5 mg/kg q24 at 4 hrs = 1-2 units/mL. Performed at [facility], [place] [address] [place], [place] [address]   POC occult blood, ED     Status: Abnormal   Collection Time: 06/08/22  8:51 AM  Result Value Ref Range   Fecal Occult Bld POSITIVE (A) NEGATIVE  CBC     Status: Abnormal   Collection Time: 06/08/22 11:57 AM  Result Value Ref Range   WBC 16.0 (H) 4.0 - 10.5 K/uL   RBC 4.16 (L) 4.22 - 5.81 MIL/uL   Hemoglobin 12.0 (L) 13.0 - 17.0 g/dL   HCT 38.0 (L) 39.0 - 52.0 %   MCV 91.3 80.0 - 100.0 fL   MCH 28.8 26.0 - 34.0 pg   MCHC 31.6 30.0 - 36.0 g/dL   RDW 16.4 (H) 11.5 - 15.5 %   Platelets 206 150 - 400 K/uL   nRBC 0.0 0.0 - 0.2 %    Comment: Performed at [facility], [place] [address], [place], [place] [address]   No results found.  Pending Labs Unresulted Labs (From admission, onward)     Start     Ordered   06/08/22 1704  CK  Once,   URGENT        06/08/22 1704   06/08/22 1650  Blood culture (routine x 2)  BLOOD CULTURE X 2,   R (with STAT occurrences)      06/08/22 1649   06/08/22 0828  Remove and replace urinary cath (placed > 5 days) then obtain urine culture from new indwelling urinary catheter.  (Urine Culture)  Once,   URGENT       Question:  Indication  Answer:  Acute gross hematuria   06/08/22 0827   Signed and Held  Comprehensive metabolic panel  Tomorrow morning,   R        Signed and Held   Signed and Held  CBC  Tomorrow morning,   R        Signed and Held            Vitals/Pain Today's Vitals   06/08/22 1610 06/08/22 1621 06/08/22 1630 06/08/22 1640  BP: (!) 86/54 (!) 81/57 (!) 81/48 (!) 77/49  Pulse: (!) 59 (!) 59 (!) 128 (!) 56  Resp: 17 (!) 21 (!) 21 15  Temp:  97.6 F (36.4 C)    TempSrc:  Oral    SpO2: 97% 98% 96% 96%  Weight:      Height:       PainSc:        Isolation Precautions No active isolations  Medications Medications  nystatin cream (MYCOSTATIN) ( Topical Given 06/08/22 1050)  ondansetron (ZOFRAN) 8 mg in sodium chloride 0.9 % 50 mL IVPB (0 mg Intravenous Stopped 06/08/22 1035)  lactated ringers infusion ( Intravenous New Bag/Given 06/08/22 1624)  DAPTOmycin (CUBICIN) 700 mg in sodium chloride 0.9 % IVPB (has no administration in time range)  sodium chloride 0.9 % bolus 1,000 mL (0 mLs Intravenous Stopped 06/08/22 1007)  sodium chloride 0.9 % bolus 1,000 mL (0 mLs Intravenous Stopped 06/08/22 1151)  0.9 %  sodium chloride infusion (0 mLs Intravenous Stopped 06/08/22 1623)  ceFEPIme (MAXIPIME) 2 g in sodium chloride 0.9 % 100 mL IVPB (0 g Intravenous Stopped 06/08/22 1734)    Mobility non-ambulatory     Focused Assessments Neuro Assessment Handoff:  Swallow screen pass? No  Cardiac Rhythm:  Sinus bradycardia       Neuro Assessment:   Neuro Checks:      Has TPA been given? No If patient is a Neuro Trauma and patient is going to OR before floor call report to 4N Charge nurse: [phone] or [phone]   R Recommendations: See Admitting Provider Note  Report given to:   Additional Notes: \   Quadriplegia s/p spinal stroke, wound on daptomycin, recurrent DVT failed Xarelto now on Lovenox. Foley stopped draining, then bleeding from around penis. Foley replaced here. BP has been soft here. Palliative care with patient now.  [VB]   1658 Patient progressively more hypotensive concerning for septic shock but patient wanting to continue IV fluids and antibiotics.  Long discussion with palliative care today.  Sounds like they would like to trial the fluids and antibiotics but if progressively worsening then transition to comfort care.  Does not want ICU admission or pressors.  I spoke with hospitalist will be down to see patient.  I have ordered for daptomycin and cefepime and fluids.

## 2022-06-08 NOTE — Consult Note (Cosign Needed Addendum)
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 New bleeding per bowel and bladder after Xarelto > Lovenox switch recently. "  HPI/Patient Profile: 77 y.o. 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  While in ED he became hypotensive, partially responding to IVF. Patient has expressed he is "ready to die."  Clinical Assessment and Goals of Care: I have reviewed medical records including EPIC notes, labs, and imaging. Received report from primary RN - acute concern of hypotension.    Went to visit patient at bedside - no family/visitors present. Patient was lying in bed awake, alert, oriented, and able to participate in conversation. No signs or non-verbal gestures of pain or discomfort noted. No respiratory distress, increased work of breathing, or secretions noted. Patient denies pain or shortness of breath.  He tells me he "feels better than this morning."  I introduced Palliative Medicine as specialized medical care for people living with serious illness. It focuses on providing relief from the symptoms and stress of a serious illness. The goal is to improve quality of life for both the patient and the family.  Patient is hopeful his wife can be present for goals of care meeting today -called wife/[name] -scheduled in person meeting for 2:30 PM today.  We discussed a brief life review of the patient as well as  functional and nutritional status.  Patient is a [place] veteran.  He has been married to his wife for over 60 years -they have 1 son and 1 daughter.  Prior to hospitalization, patient was living in a private residence with his wife.  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  Patient tells me he is numb from the diaphragm down.  Patient describes himself as "healthy and strong" before the stroke.  [name] is his primary caregiver -he has a nurse that sees him 3 times per week, MWF, for wound care.  2:30 PM Met with patient and his wife to discuss diagnosis, prognosis, GOC, EOL wishes, disposition, and options.  Dr. Jeani Hawking joined meeting - she answered all medical questions.  After receiving medical updates Dr. Jeani Hawking departed and goals of care were continued with patient and his wife.  We discussed patient's current illness and what it means in the larger context of patient's on-going co-morbidities.  Patient and his wife have a clear understanding of his current acute medical situation after having questions answered from Dr. Jeani Hawking.  Natural disease trajectory and expectations at EOL were discussed. I attempted to elicit values and goals of care important to the patient and a very detailed discussion was had around these values and goals. The difference between aggressive medical intervention and comfort care was considered in light of the patient's goals of care.  Therapeutic listening provided as patient reflects over the last 17 months since his stroke and how difficult it has been.  He tells me "I cannot do anything but lay in the bed waiting to die" and "I cannot do anything with myself."  Detailed discussion was had  on his thoughts and feelings around life prolonging interventions versus focusing on comfort/quality.  Patient is very clear that focusing on comfort/quality of life is his goal.  He states "I do not want to be prolonged."  Provided education  and counseling at length on the philosophy and benefits of hospice care. Discussed that it offers a holistic approach to care in the setting of end-stage illness, and is about supporting the patient where they are allowing nature to take it's course. Discussed the hospice team includes RNs, physicians, social workers, and chaplains. They can provide personal care, support for the family, and help keep patient out of the hospital as well as assist with DME needs for home hospice. Education provided on the difference between home vs residential hospice.  Patient and his wife are agreeable for home hospice services at discharge -requesting [facility].   We talked about transition to comfort measures in house and what that would entail inclusive of medications to control pain, dyspnea, agitation, nausea, and itching. We discussed stopping all unnecessary measures such as blood draws, needle sticks, oxygen, antibiotics, CBGs/insulin, cardiac monitoring, IVF, and frequent vital signs. Education provided that other non-pharmacological interventions would be utilized for holistic support and comfort such as spiritual support if requested, repositioning, music therapy, offering comfort feeds, and/or therapeutic listening.  It is clear that patient is ready for transition to full comfort care today and stopping antibiotics; however, his wife would prefer that his blood pressure be stabilized and he continue/finish his course of antibiotics.  Allowed space and time for wife and him to discuss options.  After discussion, patient would like to finish current antibiotic course.  Despite outcome at the end of the 4 weeks, he will not want any further antibiotics.  Patient does report shortness of breath symptoms 20 to 30 minutes after receiving antibiotics.  He would like to speak with Infectious Disease to see if they recommend switching medications in the event he is having a reaction after administration.  They are also  hopeful to have blood thinner medically optimized in light of new onset bleeding.  Patient would not want a blood transfusion, vasopressors, or ICU admission in the event of a decline -he prefers gentle medical treatment at this time.  If he does decline, he is open for transition to full comfort measures.   If patient does become medically stable for discharge, he is clear he does not want rehospitalization.  Discussed that it is uncertain if hospice will enroll him while receiving long-term antibiotics.  We will need to reach out to hospice organization to see what they can support.  If [facility] cannot support enrolling him while receiving antibiotics discussed the options of: 1.  Discharging home with outpatient palliative care to follow and transitioning to hospice at completion of antibiotic course vs 2.  Stopping antibiotics and enrolling in hospice at discharge.  Patient and his wife will make stepwise decisions pending additional insight from [facility]. Patient and his wife understand that he remains high risk for rehospitalization without hospice support.   Patient's overall goal is to die peacefully at home and his wife wishes to support this.  DNR/DNI confirmed. Durable DNR form noted at bedside.  Visit also consisted of discussions dealing with the complex and emotionally intense issues of symptom management and palliative care in the setting of serious and potentially life-threatening illness.  Patient tells me there is "nothing worse than not being able to breathe."  Ensured patient that medical team would work in  his best interest to ensure that he would not suffer.  He also understands this is also the goal of hospice.  Discussed with patient/family the importance of continued conversation with each other and the medical providers regarding overall plan of care and treatment options, ensuring decisions are within the context of the patient's values and GOCs.    Questions and concerns  were addressed. The patient/family was encouraged to call with questions and/or concerns. PMT card was provided.   Primary Decision Maker: PATIENT with support of his wife    SUMMARY OF RECOMMENDATIONS   Admit for continued gentle medical interventions to support blood pressure.  Patient does not want escalation to vasopressors or ICU admission in the event of a decline.  If he does decline, he would want transition to full comfort measures Patient is hopeful to speak with ID in regards to shortness of breath symptoms/possible reactions after receiving antibiotic infusions  Patient wishes to have blood thinner medically optimized in light of new onset acute bleeding No blood transfusions DNR/DNI confirmed - copy of durable DNR form was made and will be scanned into Vyn[employer]/ACP tab [facility] consulted for: home hospice referral, requesting [facility] Unsure if patient can be enrolled in hospice while receiving long-term antibiotics.  If not, patient may decide to discharge with outpatient palliative care and transition to hospice after completion of antibiotic course.  Patient will make stepwise decisions pending this information PMT will continue to follow and support holistically   Code Status/Advance Care Planning: DNR  Palliative Prophylaxis:  Bowel Regimen, Frequent Pain Assessment, Oral Care, Palliative Wound Care, and Turn Reposition  Additional Recommendations (Limitations, Scope, Preferences): Avoid Hospitalization, Full Scope Treatment, No Artificial Feeding, No Blood Transfusions, No Surgical Procedures, and No Tracheostomy  Psycho-social/Spiritual:  Desire for further Chaplaincy support:no Created space and opportunity for patient and family to express thoughts and feelings regarding patient's current medical situation.  Emotional support and therapeutic listening provided.  Prognosis:  < 6 months. High risk of passing in house if hypotension does not improve  Discharge Planning:  To Be Determined      Primary Diagnoses: Present on Admission: **None**   I have reviewed the medical record, interviewed the patient and family, and examined the patient. The following aspects are pertinent.  Past Medical History:  Diagnosis Date   CVA (cerebral vascular accident) [facility])    Social History   Socioeconomic History   Marital status: Married    Spouse name: Not on file   Number of children: 2   Years of education: Not on file   Highest education level: Not on file  Occupational History   Occupation: retired  Tobacco Use   Smoking status: Never   Smokeless tobacco: Never  Vaping Use   Vaping Use: Never used  Substance and Sexual Activity   Alcohol use: Not Currently   Drug use: Never   Sexual activity: Never  Other Topics Concern   Not on file  Social History Narrative   Not on file   Social Determinants of Health   Financial Resource Strain: Not on file  Food Insecurity: No Food Insecurity (05/28/2022)   Hunger Vital Sign    Worried About Running Out of Food in the Last Year: Never true    [place] in the Last Year: Never true  Transportation Needs: No Transportation Needs (05/28/2022)   PRAPARE - Hydrologist (Medical): No    Lack of Transportation (Non-Medical): No  Physical Activity:  Not on file  Stress: Not on file  Social Connections: Not on file   Family History  Problem Relation Age of Onset   Cancer Mother    Dementia Mother    Heart attack Father    Clotting disorder Father    Scheduled Meds:  nystatin cream   Topical BID   Continuous Infusions:  ondansetron (ZOFRAN) IV Stopped (06/08/22 1035)   PRN Meds:.ondansetron (ZOFRAN) IV Medications Prior to Admission:  Prior to Admission medications   Medication Sig Start Date End Date Taking? Authorizing Provider  daptomycin (CUBICIN) IVPB Inject 700 mg into the vein daily. Indication: chronic osteomyelitis First Dose: Yes Last Day of Therapy:   07/09/22 Labs - Once weekly:  CBC/D, BMP, and CPK Labs - Every other week:  ESR and CRP Method of administration: IV Push Pull PICC line at the completion of IV antibiotics Method of administration may be changed at the discretion of home infusion pharmacist based upon assessment of the patient and/or caregiver's ability to self-administer the medication ordered. 05/29/22 07/09/22  [name]., MD  enoxaparin (LOVENOX) 100 MG/ML injection Inject 1 mL (100 mg total) into the skin daily. 05/31/22 07/30/22  [name]., MD  sodium chloride (OCEAN) 0.65 % nasal spray Place 1 spray into the nose daily as needed for congestion. Patient not taking: Reported on 05/20/2022    [provider]   No Known Allergies Review of Systems  Respiratory:  Negative for shortness of breath.   Gastrointestinal:  Negative for nausea and vomiting.  Skin:  Positive for wound.  All other systems reviewed and are negative.   Physical Exam Vitals and nursing note reviewed.  Constitutional:      General: He is not in acute distress. Pulmonary:     Effort: No respiratory distress.  Skin:    General: Skin is warm and dry.  Neurological:     Mental Status: He is alert and oriented to person, place, and time.     Motor: Weakness present.  Psychiatric:        Attention and Perception: Attention normal.        Behavior: Behavior is cooperative.        Cognition and Memory: Cognition and memory normal.     Vital Signs: BP (!) 98/57   Pulse (!) 55   Temp (!) 97.4 F (36.3 C) (Oral)   Resp 13   Ht 5\' 8"  (1.727 m)   Wt 68 kg   SpO2 97%   BMI 22.79 kg/m  Pain Scale: 0-10   Pain Score: 0-No pain   SpO2: SpO2: 97 % O2 Device:SpO2: 97 % O2 Flow Rate: .   IO: Intake/output summary:  Intake/Output Summary (Last 24 hours) at 06/08/2022 1214 Last data filed at 06/08/2022 1151 Gross per 24 hour  Intake 2053.48 ml  Output --  Net 2053.48 ml    LBM:   Baseline Weight: Weight: 68  kg Most recent weight: Weight: 68 kg     Palliative Assessment/Data: PPS 30%     Time In/Out: 1045-1115/1430-1540 Time Total: 100 minutes  Greater than 50%  of this time was spent counseling and coordinating care related to the above assessment and plan.  Signed by: Lin Landsman, NP   Please contact Palliative Medicine Team phone at [phone] for questions and concerns.  For individual provider: See Amion  *Portions of this note are a verbal dictation therefore any spelling and/or grammatical errors are due to the "[place]"  system interpretation.

## 2022-06-08 NOTE — ED Notes (Signed)
Pt was incontinent of BM, cleaned pt and applied a clean brief.

## 2022-06-09 DIAGNOSIS — R310 Gross hematuria: Secondary | ICD-10-CM | POA: Diagnosis not present

## 2022-06-09 DIAGNOSIS — Z7189 Other specified counseling: Secondary | ICD-10-CM | POA: Diagnosis not present

## 2022-06-09 DIAGNOSIS — M4628 Osteomyelitis of vertebra, sacral and sacrococcygeal region: Secondary | ICD-10-CM | POA: Diagnosis not present

## 2022-06-09 DIAGNOSIS — N390 Urinary tract infection, site not specified: Secondary | ICD-10-CM

## 2022-06-09 DIAGNOSIS — N319 Neuromuscular dysfunction of bladder, unspecified: Secondary | ICD-10-CM

## 2022-06-09 DIAGNOSIS — L89154 Pressure ulcer of sacral region, stage 4: Secondary | ICD-10-CM

## 2022-06-09 DIAGNOSIS — A419 Sepsis, unspecified organism: Secondary | ICD-10-CM | POA: Diagnosis not present

## 2022-06-09 DIAGNOSIS — I959 Hypotension, unspecified: Secondary | ICD-10-CM | POA: Diagnosis not present

## 2022-06-09 DIAGNOSIS — G8220 Paraplegia, unspecified: Secondary | ICD-10-CM

## 2022-06-09 LAB — CBC
HCT: 31.1 % — ABNORMAL LOW (ref 39.0–52.0)
Hemoglobin: 10.5 g/dL — ABNORMAL LOW (ref 13.0–17.0)
MCH: 29.9 pg (ref 26.0–34.0)
MCHC: 33.8 g/dL (ref 30.0–36.0)
MCV: 88.6 fL (ref 80.0–100.0)
Platelets: 167 10*3/uL (ref 150–400)
RBC: 3.51 MIL/uL — ABNORMAL LOW (ref 4.22–5.81)
RDW: 16.3 % — ABNORMAL HIGH (ref 11.5–15.5)
WBC: 10.3 10*3/uL (ref 4.0–10.5)
nRBC: 0 % (ref 0.0–0.2)

## 2022-06-09 LAB — BASIC METABOLIC PANEL
Anion gap: 7 (ref 5–15)
BUN: 18 mg/dL (ref 8–23)
CO2: 21 mmol/L — ABNORMAL LOW (ref 22–32)
Calcium: 8.8 mg/dL — ABNORMAL LOW (ref 8.9–10.3)
Chloride: 109 mmol/L (ref 98–111)
Creatinine, Ser: 0.4 mg/dL — ABNORMAL LOW (ref 0.61–1.24)
GFR, Estimated: >60 mL/min (ref >60)
Glucose, Bld: 89 mg/dL (ref 70–99)
Potassium: 3.5 mmol/L (ref 3.5–5.1)
Sodium: 137 mmol/L (ref 135–145)

## 2022-06-09 MED ORDER — DIGOXIN 0.25 MG/ML IJ SOLN
0.2500 mg | Freq: Four times a day (QID) | INTRAMUSCULAR | Status: AC
  Administered 2022-06-09 (×2): 0.25 mg via INTRAVENOUS
  Filled 2022-06-09 (×5): qty 1

## 2022-06-09 MED ORDER — LACTATED RINGERS IV SOLN: INTRAVENOUS | Status: DC

## 2022-06-09 MED ORDER — DAKINS (1/4 STRENGTH) 0.125 % EX SOLN
Freq: Every day | CUTANEOUS | Status: DC
  Filled 2022-06-09: qty 473

## 2022-06-09 MED ORDER — SODIUM CHLORIDE 0.9 % IV SOLN: 2.0000 g | Freq: Three times a day (TID) | INTRAVENOUS | Status: DC

## 2022-06-09 MED ORDER — CHLORHEXIDINE GLUCONATE CLOTH 2 % EX PADS
6.0000 | MEDICATED_PAD | Freq: Every day | CUTANEOUS | Status: DC
  Administered 2022-06-09 – 2022-06-11 (×3): 6 via TOPICAL

## 2022-06-09 MED ORDER — SALINE SPRAY 0.65 % NA SOLN
1.0000 | NASAL | Status: DC | PRN
  Administered 2022-06-09: 1 via NASAL
  Filled 2022-06-09: qty 44

## 2022-06-09 MED ORDER — METOPROLOL SUCCINATE ER 25 MG PO TB24: 25.0000 mg | ORAL_TABLET | Freq: Every day | ORAL | Status: DC

## 2022-06-09 MED ORDER — MUPIROCIN CALCIUM 2 % EX CREA
TOPICAL_CREAM | Freq: Every day | CUTANEOUS | Status: DC
  Filled 2022-06-09: qty 15

## 2022-06-09 MED ORDER — SODIUM CHLORIDE 0.9 % IV SOLN
1.0000 g | Freq: Three times a day (TID) | INTRAVENOUS | Status: DC
  Administered 2022-06-09 – 2022-06-10 (×4): 1 g via INTRAVENOUS
  Filled 2022-06-09 (×8): qty 5

## 2022-06-09 MED ORDER — SODIUM CHLORIDE 0.9% FLUSH
10.0000 mL | Freq: Two times a day (BID) | INTRAVENOUS | Status: DC
  Administered 2022-06-09 – 2022-06-10 (×4): 10 mL

## 2022-06-09 MED ORDER — METOPROLOL TARTRATE 12.5 MG HALF TABLET
12.5000 mg | ORAL_TABLET | Freq: Two times a day (BID) | ORAL | Status: DC
  Administered 2022-06-09 – 2022-06-11 (×5): 12.5 mg via ORAL
  Filled 2022-06-09 (×6): qty 1

## 2022-06-09 NOTE — Plan of Care (Signed)
  Problem: Education: Goal: Knowledge of General Education information will improve Description: Including pain rating scale, medication(s)/side effects and non-pharmacologic comfort measures Outcome: Progressing   Problem: Clinical Measurements: Goal: Cardiovascular complication will be avoided Outcome: Progressing   Problem: Nutrition: Goal: Adequate nutrition will be maintained Outcome: Progressing   

## 2022-06-09 NOTE — TOC Progression Note (Signed)
Transition of Care [facility]) - Progression Note    Patient Details  Name: [name] MRN: [id number] Date of Birth: 1945/12/17  Transition of Care [facility]) CM/SW Contact  Levonne Lapping, RN Phone Number: 06/09/2022, 2:14 PM  Clinical Narrative:     CM met with Patient at bedside. Patient gave this CM permission to call Wife to discuss dc disposition. Wife was called.  She would like for patient to return home with [facility] to finish IVABX course and then transition to [facility] .Liaisons from both companies have been contacted and are aware. Wife requested that Provider contact her . Message relayed to Provider via secure chat and acknowledged.   [place] [phone] [facility] Liaison  [place] [phone]  [facility] will continue to follow patient for any additional discharge needs          Expected Discharge Plan and [place] with Home Health to complete IVABX then transition to Hospice with [facility]                                              Social Determinants of Health (SDOH) Interventions SDOH Screenings   Food Insecurity: No Food Insecurity (06/08/2022)  Housing: Low Risk  (06/08/2022)  Transportation Needs: No Transportation Needs (06/08/2022)  Utilities: Not At Risk (06/08/2022)  Depression (PHQ2-9): Low Risk  (05/20/2022)  Tobacco Use: Low Risk  (06/08/2022)    Readmission Risk Interventions    05/06/2021   11:43 AM  Readmission Risk Prevention Plan  Transportation Screening Complete  PCP or Specialist Appt within 5-7 Days Complete  Home Care Screening Complete  Medication Review (RN CM) Complete

## 2022-06-09 NOTE — Consult Note (Signed)
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 Seen at wound care center and has been using Dakins daily.  Trauma wound to right lateral lower leg.  States this started as a scratch.  Wound type: pressure and trauma Pressure Injury POA: Yes Measurement: sacrum:  5 cm  x3 cm x 2 cm with 1 cm undermining from 12 to 4 o'clock.   Right lower leg:  1 cm x 1 cm x 0.2 cm lesion Wound bed: pale pink nongranulating  bone palpable Pink and moist (leg)  Drainage (amount, consistency, odor)  minimal serosanguinous  no odor Periwound:intact Upper buttocks area is erythematous.  Has been exposed to rectal bleeding and patient is completely dependent with all aspects of care.  This area is quite vulnerable to further breakdown.  Patient is considering a shift in the focus of his care to comfort care. Dressing procedure/placement/frequency: Cleanse sacral wound with NS and pat dry. Apply Dakins moist gauze and cover with dry dressing (sacral foam) Change daily and PRN soilage.  Cleanse right leg with NS and pat dry. Apply mupirocin cream twice daily and PRN soilage. Cover with foam dressing.  Will not follow at this time.  Please re-consult if needed.  Estrellita Ludwig MSN, RN, FNP-BC CWON Wound, Ostomy, Continence Nurse [facility] [phone] Pager [phone]

## 2022-06-09 NOTE — Progress Notes (Addendum)
PROGRESS NOTE    [name]  IRC:[id number] DOB: [birth year] DOA: 06/08/2022 PCP: Clovia Cuff, MD  76/M w/history of spinal cord stroke in 9/22, resultant quadriplegia, neurogenic bladder, sacral decubitus wounds, sacral osteomyelitis, neurogenic bladder chronic Foley, history of pulmonary embolism, A-fib, had chronic sacral osteomyelitis, recently found to have positive bone biopsy growing VRE and finegolida, started on linezolid which was changed to 6-week course of daptomycin on 1/26, he was admitted on 1/24 with acute on chronic shortness of breath which is position dependent, he had a tunneled right IJ central venous catheter placed for antibiotic therapy at the time, also noted to have pulmonary embolism, was switched from Xarelto over to Lovenox and discharged home 1/26.  -2/4 was brought to the ED by his wife due to concerns of some bleeding in his Foley catheter, clogged Foley catheter and scant blood in his stool as well, patient has significant concerns about his very poor quality of life, is considering hospice and comfort focused care.  In the ED he was hypotensive BP in the 70-80 range, labs noted leukocytosis WBC 15.8, creatinine 0.9, clogged Foley had excessive residue, catheter was changed in the ER 2/4   Subjective: -Feels better this morning, intermittent rapid A-fib/flutter  Assessment and Plan:  Sepsis ([place]) Complicated UTI (urinary tract infection) -Potential source is urinary as he had a clogged catheter which was changed,  -Urine culture growing GNR, patient is already on daptomycin for sacral osteomyelitis, started on cefepime yesterday, had possible reaction with flushing and facial rash to this, last changed to aztreonam -Continue IV fluid today, midodrine -He also has a tunneled catheter, follow-up blood cultures -Sacral wound is always a potential source,, already on antibiotics as noted -Patient declines pressors/ICU transfer etc, palliative care is following,  plan for conservative management only with goal of stabilization, patient tells me his plan is to transition to hospice when he discharges home   Hematuria Neurogenic bladder -Mild, appears to have resolved, continue Lovenox -Infection, clogged catheter could be contributing in the background of anticoagulation   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, acute on chronic -Seen by infectious disease, started on 6-week course of IV daptomycin to be completed 2/26 -Continue wound care   History of pulmonary embolism -Recently switched from Xarelto to Lovenox, continue same  A flutter with intermittent RVR -Blood pressure is a little bit more stable, trial of dig, low-dose metoprolol -Already on full dose anticoagulation   Spinal stroke, quadriplegia -He is bedbound and total care at baseline   Hematochezia -Could have been bleeding from the wound as opposed to true hematochezia, regardless he is not a candidate for GI/endoscopic evaluation this has been discussed with patient and wife -Monitor his hemoglobin, downtrend is likely primarily from hemodilution, denies recurrent hematochezia since admission   Goals/ethics, this is a this is a chronically ill debilitated quadriplegic male with stage IV decubitus wound, sacral osteomyelitis, neurogenic bladder, significant chronic disease burden and poor quality of life -His goals are for comfort focused care, his wife wishes gentle treatment for sepsis -Continue discussions with palliative care, patient is considering hospice services upon discharge if he stabilizes     DVT prophylaxis: Full dose Lovenox Code Status: DNR Family Communication: Wife at bedside yesterday Disposition Plan: Home likely with hospice  Consultants:    Procedures:   Antimicrobials:    Objective: Vitals:   06/08/22 1854 06/08/22 2100 06/09/22 0100 06/09/22 0811  BP: (!) 91/48 (!) 94/52 (!) 94/47 (!) 104/54  Pulse: 80 62 62  Resp: (!) 21 16  14    Temp: 97.6 F (36.4 C) 98.4 F (36.9 C) 98.1 F (36.7 C) 97.6 F (36.4 C)  TempSrc: Oral Oral Oral Oral  SpO2: 95% 96% 97%   Weight:      Height:        Intake/Output Summary (Last 24 hours) at 06/09/2022 1116 Last data filed at 06/09/2022 4259 Gross per 24 hour  Intake 1980 ml  Output 750 ml  Net 1230 ml   Filed Weights   06/08/22 0835  Weight: 68 kg    Examination:  General exam: Chronically ill male laying in bed, AAOx3, no distress CVS: S1-S2, regular rhythm Lungs: Clear bilaterally Abdomen: Soft, nontender, bowel sounds present Extremities: No edema, contractures noted, Skin: Deep sacral decubitus wound with gauze/dressing Neuro: Quadriplegic with contractures Psychiatry:  Mood & affect appropriate.     Data Reviewed:   CBC: Recent Labs  Lab 06/08/22 0832 06/08/22 1157 06/09/22 0810  WBC 15.8* 16.0* 10.3  NEUTROABS 13.9*  --   --   HGB 14.4 12.0* 10.5*  HCT 42.9 38.0* 31.1*  MCV 88.6 91.3 88.6  PLT 234 206 563   Basic Metabolic Panel: Recent Labs  Lab 06/08/22 0832 06/09/22 0810  NA 134* 137  K 4.6 3.5  CL 104 109  CO2 19* 21*  GLUCOSE 165* 89  BUN 28* 18  CREATININE 0.92 0.40*  CALCIUM 9.2 8.8*   GFR: Estimated Creatinine Clearance: 77.9 mL/min (A) (by C-G formula based on SCr of 0.4 mg/dL (L)). Liver Function Tests: No results for input(s): "AST", "ALT", "ALKPHOS", "BILITOT", "PROT", "ALBUMIN" in the last 168 hours. No results for input(s): "LIPASE", "AMYLASE" in the last 168 hours. No results for input(s): "AMMONIA" in the last 168 hours. Coagulation Profile: Recent Labs  Lab 06/08/22 0832  INR 1.1   Cardiac Enzymes: Recent Labs  Lab 06/08/22 1704  CKTOTAL 28*   BNP (last 3 results) No results for input(s): "PROBNP" in the last 8760 hours. HbA1C: No results for input(s): "HGBA1C" in the last 72 hours. CBG: No results for input(s): "GLUCAP" in the last 168 hours. Lipid Profile: No results for input(s): "CHOL", "HDL",  "LDLCALC", "TRIG", "CHOLHDL", "LDLDIRECT" in the last 72 hours. Thyroid Function Tests: No results for input(s): "TSH", "T4TOTAL", "FREET4", "T3FREE", "THYROIDAB" in the last 72 hours. Anemia Panel: No results for input(s): "VITAMINB12", "FOLATE", "FERRITIN", "TIBC", "IRON", "RETICCTPCT" in the last 72 hours. Urine analysis:    Component Value Date/Time   COLORURINE RED (A) 06/08/2022 0832   APPEARANCEUR CLOUDY (A) 06/08/2022 0832   LABSPEC 1.007 06/08/2022 0832   PHURINE 9.0 (H) 06/08/2022 0832   GLUCOSEU NEGATIVE 06/08/2022 0832   HGBUR MODERATE (A) 06/08/2022 0832   BILIRUBINUR NEGATIVE 06/08/2022 0832   KETONESUR NEGATIVE 06/08/2022 0832   PROTEINUR >=300 (A) 06/08/2022 0832   NITRITE NEGATIVE 06/08/2022 0832   LEUKOCYTESUR MODERATE (A) 06/08/2022 0832   Sepsis Labs: @LABRCNTIP (procalcitonin:4,lacticidven:4)  ) Recent Results (from the past 240 hour(s))  Blood Culture (routine x 2)     Status: None   Collection Time: 05/31/22  9:52 AM   Specimen: BLOOD RIGHT WRIST  Result Value Ref Range Status   Specimen Description BLOOD RIGHT WRIST  Final   Special Requests   Final    BOTTLES DRAWN AEROBIC AND ANAEROBIC Blood Culture adequate volume   Culture   Final    NO GROWTH 5 DAYS Performed at [facility], [address] [place], [place] [address]    Report Status  06/05/2022 FINAL  Final  Blood Culture (routine x 2)     Status: None   Collection Time: 05/31/22 10:25 AM   Specimen: BLOOD LEFT WRIST  Result Value Ref Range Status   Specimen Description BLOOD LEFT WRIST  Final   Special Requests   Final    BOTTLES DRAWN AEROBIC AND ANAEROBIC Blood Culture adequate volume   Culture   Final    NO GROWTH 5 DAYS Performed at [facility], [address], [place], [place] [address]    Report Status 06/05/2022 FINAL  Final  Remove and replace urinary cath (placed > 5 days) then obtain urine culture from new indwelling urinary catheter.     Status: Abnormal  (Preliminary result)   Collection Time: 06/08/22  8:28 AM   Specimen: Urine, Catheterized  Result Value Ref Range Status   Specimen Description URINE, CATHETERIZED  Final   Special Requests NONE  Final   Culture (A)  Final    >=100,000 COLONIES/mL GRAM NEGATIVE RODS SUSCEPTIBILITIES TO FOLLOW Performed at [facility], [address], [place], [place] [address]    Report Status PENDING  Incomplete  Blood culture (routine x 2)     Status: None (Preliminary result)   Collection Time: 06/08/22  5:15 PM   Specimen: BLOOD RIGHT HAND  Result Value Ref Range Status   Specimen Description BLOOD RIGHT HAND  Final   Special Requests   Final    BOTTLES DRAWN AEROBIC AND ANAEROBIC Blood Culture results may not be optimal due to an inadequate volume of blood received in culture bottles   Culture   Final    NO GROWTH < 24 HOURS Performed at [facility], [place] [address], [place], [place] [address]    Report Status PENDING  Incomplete  Blood culture (routine x 2)     Status: None (Preliminary result)   Collection Time: 06/08/22  5:20 PM   Specimen: BLOOD LEFT ARM  Result Value Ref Range Status   Specimen Description BLOOD LEFT ARM  Final   Special Requests   Final    BOTTLES DRAWN AEROBIC AND ANAEROBIC Blood Culture adequate volume   Culture   Final    NO GROWTH < 24 HOURS Performed at [facility], [place] [address], [place], [place] [address]    Report Status PENDING  Incomplete     Radiology Studies: No results found.   Scheduled Meds:  Chlorhexidine Gluconate Cloth  6 each Topical Daily   digoxin  0.25 mg Intravenous Q6H   mupirocin cream   Topical Daily   nystatin cream   Topical BID   sodium chloride flush  10-40 mL Intracatheter Q12H   sodium hypochlorite   Irrigation Q1200   Continuous Infusions:  aztreonam 1 g (06/09/22 1045)   DAPTOmycin (CUBICIN) 700 mg in sodium chloride 0.9 % IVPB 700 mg (06/08/22 1809)   lactated ringers 100 mL/hr at 06/09/22 0927    ondansetron (ZOFRAN) IV Stopped (06/08/22 1035)     LOS: 1 day    Time spent: 69min    Domenic Polite, MD [facility]   06/09/2022, 11:16 AM

## 2022-06-09 NOTE — Progress Notes (Signed)
Daily Progress Note   Patient Name: [name]       Date: 06/09/2022 DOB: 15-Feb-1946  Age: 77 y.o. MRN#: [id number] Attending Physician: Domenic Polite, MD Primary Care Physician: Clovia Cuff, MD Admit Date: 06/08/2022  Reason for Consultation/Follow-up: Establishing goals of care  Subjective: Medical records reviewed including progress notes, labs, imaging. Patient assessed at the bedside.  He reports feeling well, other than some sinus congestion.  He is glad that his new antibiotic is not causing him a reaction and he looks forward to going home with hospice.  He is interested in a nasal saline spray.  Discussed with RN.  No family was present during my visit with the patient.  He requested that this PA call his wife for further conversation about her needs and coordination with a hospice agency to discharge him home.  I then called his wife [name] and reviewed their conversation with PMT yesterday.  She tells me that she is very interested in patient continuing his course of antibiotics, although she has not received confirmation from [facility] hospice yet as to whether this is doable.  She does feel good about their services otherwise given their connection with [facility] and plans to reach out to follow-up this afternoon via text message.  I shared that I would try to follow-up as well and she is appreciative.  I then attempted to call [facility] hospice liaison [name], but was unable to reach.  Left a voicemail requesting a return call.  Questions and concerns addressed. PMT will continue to support holistically.   Length of Stay: 1  Physical Exam Vitals and nursing note reviewed.  Constitutional:      General: He is not in acute distress. Cardiovascular:     Rate and  Rhythm: Normal rate.  Pulmonary:     Effort: Pulmonary effort is normal.  Neurological:     Mental Status: He is alert. Mental status is at baseline.  Psychiatric:        Mood and Affect: Mood normal.        Behavior: Behavior normal.            Vital Signs: BP (!) 104/54 (BP Location: Right Arm)   Pulse 62   Temp 97.6 F (36.4 C) (Oral)   Resp 14   Ht 5\' 8"  (1.727  m)   Wt 68 kg   SpO2 97%   BMI 22.79 kg/m  SpO2: SpO2: 97 % O2 Device: O2 Device: Room Air O2 Flow Rate:        Palliative Assessment/Data: 30%   Palliative Care Assessment & Plan   Patient Profile: 77 y.o. 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  While in ED he became hypotensive, partially responding to IVF. Patient has expressed he is "ready to die."   Assessment: Goals of care conversation Quadriplegia status post spinal stroke PE and afib on Xarelto Complicated UTI Neurogenic bladder  Stage 4 sacral decubitus wound Hematochezia  Recommendations/Plan: Continue DNR Continue current care  [name] remains open to hospice; I was unable to reach [facility] hospice liaison regarding feasibility of continuing his antibiotics at home while on hospice care Per chart review, it appears patient's wife now has made the decision to return home with [facility] and then transition to hospice Psychosocial and emotional support provided PMT will continue to follow and support   Prognosis:  < 6 months  Discharge Planning: Home with [place] then transition to hospice (per chart review)  Care plan was discussed with patient, patient's wife, RN, TOC, Dr. Broadus John   MDM High         Jesus Granberry Johnnette Litter, PA-C  Palliative Medicine Team Team phone # [phone]  Thank you for allowing the Palliative  Medicine Team to assist in the care of this patient. Please utilize secure chat with additional questions, if there is no response within 30 minutes please call the above phone number.  Palliative Medicine Team providers are available by phone from 7am to 7pm daily and can be reached through the team cell phone.  Should this patAs 30%3%ient require assistance outside of these hours, please call the patient's attending physician.

## 2022-06-10 ENCOUNTER — Inpatient Hospital Stay (HOSPITAL_COMMUNITY): Payer: Medicare Other

## 2022-06-10 DIAGNOSIS — R310 Gross hematuria: Secondary | ICD-10-CM | POA: Diagnosis not present

## 2022-06-10 DIAGNOSIS — Z7189 Other specified counseling: Secondary | ICD-10-CM | POA: Diagnosis not present

## 2022-06-10 DIAGNOSIS — I959 Hypotension, unspecified: Secondary | ICD-10-CM | POA: Diagnosis not present

## 2022-06-10 DIAGNOSIS — A419 Sepsis, unspecified organism: Secondary | ICD-10-CM | POA: Diagnosis not present

## 2022-06-10 DIAGNOSIS — M4628 Osteomyelitis of vertebra, sacral and sacrococcygeal region: Secondary | ICD-10-CM | POA: Diagnosis not present

## 2022-06-10 HISTORY — PX: IR REMOVAL TUN CV CATH W/O FL: IMG2289

## 2022-06-10 LAB — URINE CULTURE: Culture: >=100000 — AB

## 2022-06-10 MED ORDER — SULFAMETHOXAZOLE-TRIMETHOPRIM 800-160 MG PO TABS
1.0000 | ORAL_TABLET | Freq: Two times a day (BID) | ORAL | Status: DC
  Administered 2022-06-11: 1 via ORAL
  Filled 2022-06-10 (×3): qty 1

## 2022-06-10 NOTE — Progress Notes (Signed)
Daily Progress Note   Patient Name: [name]       Date: 06/10/2022 DOB: 12-Apr-1946  Age: 77 y.o. MRN#: [id number] Attending Physician: Jesus Polite, MD Primary Care Physician: Jesus Cuff, MD Admit Date: 06/08/2022  Reason for Consultation/Follow-up: Establishing goals of care  Subjective: Medical records reviewed including progress notes, labs, imaging. Patient assessed at the bedside.   He is concerned about not receiving his antibiotics yet. MAR was reviewed and reassurance provided that his next dose is due at 2pm. He is also concerned about his lunch tray being late, as yesterday the same thing happened and he was not hungry enough for dinner when that arrived soon after lunch. Emotional support and therapeutic listening was provided. Lunch tray then arrived and I offered to assist, however he preferred I call NT for assistance and call his wife to discuss returning home with hospice.  I then called patient's wife who shared she was on her way to visit at the bedside. She tells me they had a conversation last night and after patient's insistence on going home with hospice services, she is now agreeable and wondering about next steps to move forward with the hospice referral. Offered to reach out to [facility] and return to the bedside for further conversations.  I arrived to the bedside again. [name] shares that she has spoken with [facility] hospice and informed them that there are no DME needs. She is wondering if a discharge time could be planned for tomorrow, as they want to send out a hospice RN about an hour later. Offered to discuss with primary attending and TOC for coordination. No other questions or concerns.  Questions and concerns addressed. PMT will continue to support holistically.    Length of Stay: 2  Physical Exam Vitals and nursing note reviewed.  Constitutional:      General: He is not in acute distress. Cardiovascular:     Rate and Rhythm: Normal rate.  Pulmonary:     Effort: Pulmonary effort is normal.  Neurological:     Mental Status: He is alert. Mental status is at baseline.  Psychiatric:        Mood and Affect: Mood normal.        Behavior: Behavior normal.            Vital Signs: BP Marland Kitchen)  154/81 (BP Location: Right Arm)   Pulse 83   Temp 97.6 F (36.4 C) (Axillary)   Resp 18   Ht 5\' 8"  (1.727 m)   Wt 68 kg   SpO2 98%   BMI 22.79 kg/m  SpO2: SpO2: 98 % O2 Device: O2 Device: Room Air O2 Flow Rate:        Palliative Assessment/Data: 30%   Palliative Care Assessment & Plan   Patient Profile: 77 y.o. 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  While in ED he became hypotensive, partially responding to IVF. Patient has expressed he is "ready to die."   Assessment: Goals of care conversation Quadriplegia status post spinal stroke PE and afib on Xarelto Complicated UTI Neurogenic bladder  Stage 4 sacral decubitus wound Hematochezia  Recommendations/Plan: Continue DNR Continue current care  [name] is now agreeable to discharge home with hospice tomorrow, TOC notified  Psychosocial and emotional support provided PMT will continue to follow and support   Prognosis:  < 6 months  Discharge Planning: Home with Hospice    Care plan was discussed with patient, patient's wife, TOC, Dr. Broadus Pacheco   MDM High         Jesus Rossano Johnnette Litter, PA-C  Palliative Medicine Team Team phone # [phone]  Thank you for allowing the Palliative Medicine Team to assist in the care of this patient. Please utilize secure chat with  additional questions, if there is no response within 30 minutes please call the above phone number.  Palliative Medicine Team providers are available by phone from 7am to 7pm daily and can be reached through the team cell phone.  Should this patAs 30%3%ient require assistance outside of these hours, please call the patient's attending physician.

## 2022-06-10 NOTE — Progress Notes (Incomplete)
PHARMACY CONSULT NOTE FOR:  OUTPATIENT  PARENTERAL ANTIBIOTIC THERAPY (OPAT)  Indication:  Regimen: Daptomycin 700 mg IV every 24 hours End date:   IV antibiotic discharge orders are pended. To discharging provider:  please sign these orders via discharge navigator,  Select New Orders & click on the button choice - Manage This Unsigned Work.     Thank you for allowing pharmacy to be a part of this patient's care.  Lawson Radar 06/10/2022, 4:10 PM

## 2022-06-10 NOTE — TOC Progression Note (Signed)
Transition of Care [facility]) - Progression Note    Patient Details  Name: [name] MRN: [id number] Date of Birth: Mar 28, 1946  Transition of [facility]) CM/SW Contact  Levonne Lapping, RN Phone Number: 06/10/2022, 1:51 PM  Clinical Narrative:     Received secure chat that Pateint will be stopping IV ABX to go home on Hospice Care. [facility] has been notified Wife is requesting a hospital bed          Expected Discharge Plan and Services                                               Social Determinants of Health (SDOH) Interventions SDOH Screenings   Food Insecurity: No Food Insecurity (06/08/2022)  Housing: Low Risk  (06/08/2022)  Transportation Needs: No Transportation Needs (06/08/2022)  Utilities: Not At Risk (06/08/2022)  Depression (PHQ2-9): Low Risk  (05/20/2022)  Tobacco Use: Low Risk  (06/08/2022)    Readmission Risk Interventions    05/06/2021   11:43 AM  Readmission Risk Prevention Plan  Transportation Screening Complete  PCP or Specialist Appt within 5-7 Days Complete  Home Care Screening Complete  Medication Review (RN CM) Complete

## 2022-06-10 NOTE — Progress Notes (Signed)
PROGRESS NOTE    [name]  PFX:[id number] DOB: 12-14-1945 DOA: 06/08/2022 PCP: Clovia Cuff, MD  76/M w/history of spinal cord stroke in 9/22, resultant quadriplegia, neurogenic bladder, sacral decubitus wounds, sacral osteomyelitis, neurogenic bladder chronic Foley, history of pulmonary embolism, A-fib, had chronic sacral osteomyelitis, recently found to have positive bone biopsy growing VRE and finegolida, started on linezolid which was changed to 6-week course of daptomycin on 1/26, he was admitted on 1/24 with acute on chronic shortness of breath which is position dependent, he had a tunneled right IJ central venous catheter placed for antibiotic therapy at the time, also noted to have pulmonary embolism, was switched from Xarelto over to Lovenox and discharged home 1/26.  -2/4 was brought to the ED by his wife due to concerns of some bleeding in his Foley catheter, clogged Foley catheter and scant blood in his stool as well, patient has significant concerns about his very poor quality of life, is considering hospice and comfort focused care.  In the ED he was hypotensive BP in the 70-80 range, labs noted leukocytosis WBC 15.8, creatinine 0.9, clogged Foley had excessive residue, catheter was changed in the ER 2/4  Subjective: -Feels better overall, no events overnight, heart rate improved  Assessment and Plan:  Sepsis ([place]) Complicated UTI (urinary tract infection) -Potential source is urinary as he had a clogged catheter which was changed,  -Urine culture growing GNR, patient is already on daptomycin for sacral osteomyelitis, started on cefepime yesterday, had possible reaction with flushing and facial rash to this, abx changed to aztreonam -Stop IV fluids, cut down midodrine -He also has a tunneled catheter, blood cultures negative thus far -Urine culture with Providencia rettgeri  -Patient declines pressors/ICU transfer etc, palliative care is following, plan for conservative  management only with goal of stabilization, patient tells me his plan is to transition to hospice when he discharges home -Palliative team, TOC following   Hematuria Neurogenic bladder -Mild, appears to have resolved, continue Lovenox -Infection, clogged catheter could be contributing in the background of anticoagulation   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, acute on chronic -Seen by infectious disease, started on 6-week course of IV daptomycin to be completed 2/26 -Continue wound care   History of pulmonary embolism -Recently switched from Xarelto to Lovenox, continue same  A flutter with intermittent RVR -Blood pressure is a little bit more stable, continue low-dose metoprolol -Already on full dose anticoagulation   Spinal stroke, quadriplegia -He is bedbound and total care at baseline   Hematochezia -Could have been bleeding from the wound as opposed to true hematochezia, regardless he is not a candidate for GI/endoscopic evaluation this has been discussed with patient and wife -Monitor his hemoglobin, downtrend is likely primarily from hemodilution, denies recurrent hematochezia since admission   Goals/ethics, this is a this is a chronically ill debilitated quadriplegic male with stage IV decubitus wound, sacral osteomyelitis, neurogenic bladder, significant chronic disease burden and poor quality of life -His goals are for comfort focused care, his wife wishes gentle treatment for sepsis -Continue discussions with palliative care, patient is considering hospice services upon discharge if he stabilizes     DVT prophylaxis: Full dose Lovenox Code Status: DNR Family Communication: No family at bedside, called and updated wife yesterday Disposition Plan: Home likely with hospice tomorrow  Consultants:    Procedures:   Antimicrobials:    Objective: Vitals:   06/10/22 0800 06/10/22 1000 06/10/22 1104 06/10/22 1200  BP: (!) 164/80  (!) 162/74 (!) 154/81  Pulse: 92 91 94 83  Resp: (!) 24 18  18   Temp: 97.6 F (36.4 C)     TempSrc: Axillary     SpO2: 97% 96%  98%  Weight:      Height:        Intake/Output Summary (Last 24 hours) at 06/10/2022 1353 Last data filed at 06/10/2022 1008 Gross per 24 hour  Intake 480 ml  Output 2600 ml  Net -2120 ml   Filed Weights   06/08/22 0835  Weight: 68 kg    Examination:  General exam: Chronically ill male laying in bed, AAOx3, no distress CVS: S1-S2, regular rhythm Lungs: Clear bilaterally Abdomen: Soft, nontender, bowel sounds present Extremities: No edema, contractures noted  Skin: Deep sacral decubitus wound with gauze/dressing Neuro: Quadriplegic with contractures Psychiatry:  Mood & affect appropriate.     Data Reviewed:   CBC: Recent Labs  Lab 06/08/22 0832 06/08/22 1157 06/09/22 0810  WBC 15.8* 16.0* 10.3  NEUTROABS 13.9*  --   --   HGB 14.4 12.0* 10.5*  HCT 42.9 38.0* 31.1*  MCV 88.6 91.3 88.6  PLT 234 206 825   Basic Metabolic Panel: Recent Labs  Lab 06/08/22 0832 06/09/22 0810  NA 134* 137  K 4.6 3.5  CL 104 109  CO2 19* 21*  GLUCOSE 165* 89  BUN 28* 18  CREATININE 0.92 0.40*  CALCIUM 9.2 8.8*   GFR: Estimated Creatinine Clearance: 77.9 mL/min (A) (by C-G formula based on SCr of 0.4 mg/dL (L)). Liver Function Tests: No results for input(s): "AST", "ALT", "ALKPHOS", "BILITOT", "PROT", "ALBUMIN" in the last 168 hours. No results for input(s): "LIPASE", "AMYLASE" in the last 168 hours. No results for input(s): "AMMONIA" in the last 168 hours. Coagulation Profile: Recent Labs  Lab 06/08/22 0832  INR 1.1   Cardiac Enzymes: Recent Labs  Lab 06/08/22 1704  CKTOTAL 28*   BNP (last 3 results) No results for input(s): "PROBNP" in the last 8760 hours. HbA1C: No results for input(s): "HGBA1C" in the last 72 hours. CBG: No results for input(s): "GLUCAP" in the last 168 hours. Lipid Profile: No results for input(s): "CHOL", "HDL", "LDLCALC", "TRIG",  "CHOLHDL", "LDLDIRECT" in the last 72 hours. Thyroid Function Tests: No results for input(s): "TSH", "T4TOTAL", "FREET4", "T3FREE", "THYROIDAB" in the last 72 hours. Anemia Panel: No results for input(s): "VITAMINB12", "FOLATE", "FERRITIN", "TIBC", "IRON", "RETICCTPCT" in the last 72 hours. Urine analysis:    Component Value Date/Time   COLORURINE RED (A) 06/08/2022 0832   APPEARANCEUR CLOUDY (A) 06/08/2022 0832   LABSPEC 1.007 06/08/2022 0832   PHURINE 9.0 (H) 06/08/2022 0832   GLUCOSEU NEGATIVE 06/08/2022 0832   HGBUR MODERATE (A) 06/08/2022 0832   BILIRUBINUR NEGATIVE 06/08/2022 0832   KETONESUR NEGATIVE 06/08/2022 0832   PROTEINUR >=300 (A) 06/08/2022 0832   NITRITE NEGATIVE 06/08/2022 0832   LEUKOCYTESUR MODERATE (A) 06/08/2022 0832   Sepsis Labs: @LABRCNTIP (procalcitonin:4,lacticidven:4)  )Radiology Studies: No results found.   Scheduled Meds:  Chlorhexidine Gluconate Cloth  6 each Topical Daily   metoprolol tartrate  12.5 mg Oral BID   mupirocin cream   Topical Daily   nystatin cream   Topical BID   sodium chloride flush  10-40 mL Intracatheter Q12H   sodium hypochlorite   Irrigation Q1200   Continuous Infusions:  aztreonam 1 g (06/10/22 0640)   DAPTOmycin (CUBICIN) 700 mg in sodium chloride 0.9 % IVPB 700 mg (06/09/22 2230)   ondansetron (ZOFRAN) IV Stopped (06/08/22 1035)     LOS: 2 days  Time spent: 25min    Domenic Polite, MD [facility]   06/10/2022, 1:53 PM

## 2022-06-11 ENCOUNTER — Other Ambulatory Visit (HOSPITAL_COMMUNITY): Payer: Self-pay

## 2022-06-11 ENCOUNTER — Encounter (HOSPITAL_BASED_OUTPATIENT_CLINIC_OR_DEPARTMENT_OTHER): Payer: Medicare Other | Admitting: General Surgery

## 2022-06-11 DIAGNOSIS — I959 Hypotension, unspecified: Secondary | ICD-10-CM

## 2022-06-11 MED ORDER — SULFAMETHOXAZOLE-TRIMETHOPRIM 800-160 MG PO TABS
1.0000 | ORAL_TABLET | Freq: Two times a day (BID) | ORAL | 0 refills | Status: AC
  Filled 2022-06-11: qty 6, 3d supply, fill #0

## 2022-06-11 MED ORDER — SENNA 8.6 MG PO TABS
1.0000 | ORAL_TABLET | Freq: Every day | ORAL | 0 refills | Status: AC | PRN
  Filled 2022-06-11: qty 10, 10d supply, fill #0

## 2022-06-11 MED ORDER — METOPROLOL TARTRATE 25 MG PO TABS
12.5000 mg | ORAL_TABLET | Freq: Two times a day (BID) | ORAL | 0 refills | Status: AC
  Filled 2022-06-11: qty 60, 60d supply, fill #0

## 2022-06-11 NOTE — Care Management Important Message (Signed)
Important Message  Patient Details  Name: [name] MRN: [id number] Date of Birth: 10-05-1945   Medicare Important Message Given:  Yes     Hannah Beat 06/11/2022, 1:34 PM

## 2022-06-11 NOTE — Plan of Care (Addendum)

## 2022-06-11 NOTE — Discharge Summary (Signed)
Physician Discharge Summary  [name] SJG:[id number] DOB: 1945/08/11 DOA: 06/08/2022  PCP: Annita Brod, MD  Admit date: 06/08/2022 Discharge date: 06/11/2022  Time spent: 35 minutes  Recommendations for Outpatient Follow-up:  Home with hospice services with [facility] hosp[facility] care  Discharge Diagnoses:  Principal Problem:   Sepsis [facility]) Active Problems:   Complicated UTI (urinary tract infection)   Neurogenic bladder   Pulmonary embolism (HCC)   History of pulmonary embolus (PE)   Decubitus ulcer of sacral region, stage 4 (HCC)   Sacral osteomyelitis (HCC)   Paraplegia (HCC)   Hypotension   Discharge Condition: Stable but guarded  Diet recommendation: Regular  Filed Weights   06/08/22 0835  Weight: 68 kg    History of present illness:  77/M w/history of spinal cord stroke in 9/22, resultant quadriplegia, neurogenic bladder, sacral decubitus wounds, sacral osteomyelitis, neurogenic bladder chronic Foley, history of pulmonary embolism, A-fib, had chronic sacral osteomyelitis, recently found to have positive bone biopsy growing VRE and finegolida, started on linezolid which was changed to 6-week course of daptomycin on 1/26, he was admitted on 1/24 with acute on chronic shortness of breath which is position dependent, he had a tunneled right IJ central venous catheter placed for antibiotic therapy at the time, also noted to have pulmonary embolism, was switched from Xarelto over to Lovenox and discharged home 1/26.  -2/4 was brought to the ED by his wife due to concerns of some bleeding in his Foley catheter, clogged Foley catheter and scant blood in his stool as well, patient has significant concerns about his very poor quality of life, is considering hospice and comfort focused care.  In the ED he was hypotensive BP in the 70-80 range, labs noted leukocytosis WBC 15.8, creatinine 0.9, clogged Foley had excessive residue, catheter was changed in the ER 2/4  Hospital Course:    Sepsis (HCC) Complicated UTI (urinary tract infection) -Potential source is urinary as he had a clogged catheter which was changed,  -Urine culture grew [place] which was sensitive to cefepime ceftriaxone and Bactrim, resistant to ampicillin, cefazolin -Clinically improved with fluids, antibiotics and supportive care, was weaned off midodrine after 24 hours -Transition to oral Bactrim, he will continue this for 3 more days  -Now plan to discharge home with hospice services, see discussion below   Hematuria Neurogenic bladder -Mild, appears to have resolved, continue Lovenox -Infection, clogged catheter could be contributing in the background of anticoagulation   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, acute on chronic -Seen by infectious disease, started on 6-week course of IV daptomycin to be completed 2/26 -Seen by palliative care this admission, patient has decided to go home with hospice services, off long-term IV antibiotic therapy, central line was removed -Continue wound care   History of pulmonary embolism -Recently switched from Xarelto to Lovenox, continue same   A flutter with intermittent RVR -Blood pressure is a little bit more stable, continue low-dose metoprolol -Already on full dose anticoagulation   Spinal stroke, quadriplegia -He is bedbound and total care at baseline   Hematochezia -Could have been bleeding from the wound as opposed to true hematochezia, regardless he is not a candidate for GI/endoscopic evaluation this has been discussed with patient and wife -Monitor his hemoglobin, downtrend is likely primarily from hemodilution, denies recurrent hematochezia since admission   Goals/ethics, this is a this is a chronically ill debilitated quadriplegic male with stage IV decubitus wound, sacral osteomyelitis, neurogenic bladder, significant chronic disease burden and poor quality of life -His  goals are for comfort focused care -Continued  discussions with palliative care, patient has opted to set up home hospice services at discharge today   Discharge Exam: Vitals:   06/10/22 2300 06/11/22 0808  BP: (!) 180/79 127/73  Pulse: 80 88  Resp: 18   Temp: 99.1 F (37.3 C) 98.2 F (36.8 C)  SpO2: 97% 100%   General exam: Chronically ill male laying in bed, AAOx3, no distress CVS: S1-S2, regular rhythm Lungs: Clear bilaterally Abdomen: Soft, nontender, bowel sounds present Extremities: No edema, contractures noted  Skin: Deep sacral decubitus wound with gauze/dressing Neuro: Quadriplegic with contractures Psychiatry:  Mood & affect appropriate.  Discharge Instructions   Discharge Instructions     Diet general   Complete by: As directed    Discharge wound care:   Complete by: As directed    As before   Increase activity slowly   Complete by: As directed       Allergies as of 06/11/2022       Reactions   Cefepime Other (See Comments)   Facial flushing, tingling of lips        Medication List     STOP taking these medications    daptomycin  IVPB Commonly known as: CUBICIN       TAKE these medications    enoxaparin 100 MG/ML injection Commonly known as: LOVENOX Inject 1 mL (100 mg total) into the skin daily.   metoprolol tartrate 25 MG tablet Commonly known as: LOPRESSOR Take 0.5 tablets (12.5 mg total) by mouth 2 (two) times daily.   senna 8.6 MG Tabs tablet Commonly known as: SENOKOT Take 1 tablet (8.6 mg total) by mouth daily as needed for mild constipation.   SIMILASAN ALLERGY EYE RELIEF OP Place 1 drop into both eyes 2 (two) times daily as needed (For watery eyes per spouse).   sodium chloride 0.65 % nasal spray Commonly known as: OCEAN Place 1 spray into the nose daily as needed for congestion.   sulfamethoxazole-trimethoprim 800-160 MG tablet Commonly known as: BACTRIM DS Take 1 tablet by mouth every 12 (twelve) hours for 3 days.               Discharge Care  Instructions  (From admission, onward)           Start     Ordered   06/11/22 0000  Discharge wound care:       Comments: As before   06/11/22 0736           Allergies  Allergen Reactions   Cefepime Other (See Comments)    Facial flushing, tingling of lips    Follow-up Information     Annita Brod, MD .   Specialty: Internal Medicine Contact information: [address] [place] [address] [phone]         [facility]. Call.   Contact information: Trena Platt  [phone]                 The results of significant diagnostics from this hospitalization (including imaging, microbiology, ancillary and laboratory) are listed below for reference.    Significant Diagnostic Studies: IR Removal Tun Cv Cath W/O FL  Result Date: 06/11/2022 INDICATION: Venous access no longer needed EXAM: REMOVAL TUNNELED CENTRAL VENOUS CATHETER MEDICATIONS: None ANESTHESIA/SEDATION: none FLUOROSCOPY TIME:  None used. COMPLICATIONS: None immediate. PROCEDURE: The patient's right chest and catheter was prepped in a normal sterile fashion. The suture was clipped and using gentle traction the  catheter was removed in it's entirety. Pressure was held till hemostasis was obtained. A sterile dressing was applied. The patient tolerated the procedure well with no immediate complications. IMPRESSION: Successful catheter removal as described above. Read by Sheliah Plane, PA-C Electronically Signed   By: Irish Lack M.D.   On: 06/11/2022 07:58   DG Chest Port 1 View  Result Date: 05/31/2022 CLINICAL DATA:  Tremors and weakness.  Suspected sepsis. EXAM: PORTABLE CHEST 1 VIEW COMPARISON:  05/28/2022 FINDINGS: The heart size and mediastinal contours are within normal limits. Right jugular catheter is seen with tip overlying the proximal right atrium. Cardiac loop recorder noted. Both lungs are clear. IMPRESSION: No active disease. Electronically Signed    By: Danae Orleans M.D.   On: 05/31/2022 09:30   ECHOCARDIOGRAM COMPLETE  Result Date: 05/30/2022    ECHOCARDIOGRAM REPORT   Patient Name:   [name] Date of Exam: 05/30/2022 Medical Rec #:  [id number]    Height:       68.0 in Accession #:    [id number]   Weight:       147.3 lb Date of Birth:  08/01/1945    BSA:          1.794 m Patient Age:    76 years     BP:           104/59 mmHg Patient Gender: M            HR:           75 bpm. Exam Location:  Inpatient Procedure: 2D Echo Indications:    pulmonary embolus  History:        Patient has prior history of Echocardiogram examinations, most                 recent 05/05/2021. Arrythmias:Atrial Fibrillation and Bradycardia;                 Signs/Symptoms:Dyspnea.  Sonographer:    Delcie Roch RDCS Referring Phys: [phone] A CALDWELL POWELL JR  Sonographer Comments: Restricted left arm. IMPRESSIONS  1. Left ventricular ejection fraction, by estimation, is 60 to 65%. The left ventricle has normal function. The left ventricle has no regional wall motion abnormalities. Left ventricular diastolic parameters were normal.  2. Right ventricular systolic function is mildly reduced. The right ventricular size is mildly enlarged. There is normal pulmonary artery systolic pressure.  3. The mitral valve is normal in structure. No evidence of mitral valve regurgitation. No evidence of mitral stenosis.  4. The aortic valve is tricuspid. There is mild calcification of the aortic valve. There is mild thickening of the aortic valve. Aortic valve regurgitation is mild. Aortic valve sclerosis is present, with no evidence of aortic valve stenosis.  5. The inferior vena cava is normal in size with greater than 50% respiratory variability, suggesting right atrial pressure of 3 mmHg. FINDINGS  Left Ventricle: Left ventricular ejection fraction, by estimation, is 60 to 65%. The left ventricle has normal function. The left ventricle has no regional wall motion abnormalities. The left  ventricular internal cavity size was normal in size. There is  no left ventricular hypertrophy. Left ventricular diastolic parameters were normal. Right Ventricle: The right ventricular size is mildly enlarged. No increase in right ventricular wall thickness. Right ventricular systolic function is mildly reduced. There is normal pulmonary artery systolic pressure. The tricuspid regurgitant velocity  is 2.40 m/s, and with an assumed right atrial pressure of 3 mmHg, the estimated right ventricular systolic pressure is  26.0 mmHg. Left Atrium: Left atrial size was normal in size. Right Atrium: Right atrial size was normal in size. Pericardium: There is no evidence of pericardial effusion. Mitral Valve: The mitral valve is normal in structure. No evidence of mitral valve regurgitation. No evidence of mitral valve stenosis. Tricuspid Valve: The tricuspid valve is normal in structure. Tricuspid valve regurgitation is mild . No evidence of tricuspid stenosis. Aortic Valve: The aortic valve is tricuspid. There is mild calcification of the aortic valve. There is mild thickening of the aortic valve. Aortic valve regurgitation is mild. Aortic valve sclerosis is present, with no evidence of aortic valve stenosis. Pulmonic Valve: The pulmonic valve was normal in structure. Pulmonic valve regurgitation is not visualized. No evidence of pulmonic stenosis. Aorta: The aortic root is normal in size and structure. Venous: The inferior vena cava is normal in size with greater than 50% respiratory variability, suggesting right atrial pressure of 3 mmHg. IAS/Shunts: No atrial level shunt detected by color flow Doppler.  LEFT VENTRICLE PLAX 2D LVIDd:         4.40 cm   Diastology LVIDs:         3.10 cm   LV e' medial:    7.30 cm/s LV PW:         1.10 cm   LV E/e' medial:  7.0 LV IVS:        1.10 cm   LV e' lateral:   6.22 cm/s LVOT diam:     2.10 cm   LV E/e' lateral: 8.3 LV SV:         54 LV SV Index:   30 LVOT Area:     3.46 cm  RIGHT  VENTRICLE RV S prime:     15.70 cm/s TAPSE (M-mode): 1.5 cm LEFT ATRIUM             Index        RIGHT ATRIUM          Index LA diam:        3.10 cm 1.73 cm/m   RA Area:     9.59 cm LA Vol (A2C):   23.4 ml 13.04 ml/m  RA Volume:   16.10 ml 8.97 ml/m LA Vol (A4C):   19.6 ml 10.92 ml/m LA Biplane Vol: 21.3 ml 11.87 ml/m  AORTIC VALVE LVOT Vmax:   91.80 cm/s LVOT Vmean:  58.800 cm/s LVOT VTI:    0.157 m  AORTA Ao Root diam: 3.20 cm Ao Asc diam:  3.40 cm MV E velocity: 51.40 cm/s  TRICUSPID VALVE MV A velocity: 71.80 cm/s  TR Peak grad:   23.0 mmHg MV E/A ratio:  0.72        TR Vmax:        240.00 cm/s                             SHUNTS                            Systemic VTI:  0.16 m                            Systemic Diam: 2.10 cm Charlton Haws MD Electronically signed by Charlton Haws MD Signature Date/Time: 05/30/2022/4:13:38 PM    Final    VAS [place] LOWER EXTREMITY VENOUS (DVT)  Result Date: 05/30/2022  Lower Venous  DVT Study Patient Name:  DAEMION MCNIEL  Date of Exam:   05/30/2022 Medical Rec #: [id number]     Accession #:    [id number] Date of Birth: [birth year]     Patient Gender: M Patient Age:   45 years Exam Location:  [facility] Procedure:      VAS [place] LOWER EXTREMITY VENOUS (DVT) Referring Phys: A POWELL JR --------------------------------------------------------------------------------  Indications: Quadriplegia, history of PE and Afib.  Risk Factors: CVA. Anticoagulation: Xarelto. Comparison Study: 12/08/20 - Negative DVT Performing Technologist: Velva Harman Sturdivant RDMS, RVT  Examination Guidelines: A complete evaluation includes B-mode imaging, spectral Doppler, color Doppler, and power Doppler as needed of all accessible portions of each vessel. Bilateral testing is considered an integral part of a complete examination. Limited examinations for reoccurring indications may be performed as noted. The reflux portion of the exam is performed with the patient in reverse Trendelenburg.   +---------+---------------+---------+-----------+----------+--------------+ RIGHT    CompressibilityPhasicitySpontaneityPropertiesThrombus Aging +---------+---------------+---------+-----------+----------+--------------+ CFV      Full           Yes      Yes                                 +---------+---------------+---------+-----------+----------+--------------+ SFJ      Full                                                        +---------+---------------+---------+-----------+----------+--------------+ FV Prox  Full                                                        +---------+---------------+---------+-----------+----------+--------------+ FV Mid   Full                                                        +---------+---------------+---------+-----------+----------+--------------+ FV DistalFull                                                        +---------+---------------+---------+-----------+----------+--------------+ PFV      Full                                                        +---------+---------------+---------+-----------+----------+--------------+ POP      Full           Yes      Yes                                 +---------+---------------+---------+-----------+----------+--------------+ PTV      Full                                                        +---------+---------------+---------+-----------+----------+--------------+  PERO     Full                                                        +---------+---------------+---------+-----------+----------+--------------+   +---------+---------------+---------+-----------+----------+--------------+ LEFT     CompressibilityPhasicitySpontaneityPropertiesThrombus Aging +---------+---------------+---------+-----------+----------+--------------+ CFV      Full           Yes      Yes                                  +---------+---------------+---------+-----------+----------+--------------+ SFJ      Full                                                        +---------+---------------+---------+-----------+----------+--------------+ FV Prox  Full                                                        +---------+---------------+---------+-----------+----------+--------------+ FV Mid   Full                                                        +---------+---------------+---------+-----------+----------+--------------+ FV DistalFull                                                        +---------+---------------+---------+-----------+----------+--------------+ PFV      Full                                                        +---------+---------------+---------+-----------+----------+--------------+ POP      Full           Yes      Yes                                 +---------+---------------+---------+-----------+----------+--------------+ PTV      Full                                                        +---------+---------------+---------+-----------+----------+--------------+ PERO     Full                                                        +---------+---------------+---------+-----------+----------+--------------+  Summary: BILATERAL: - No evidence of deep vein thrombosis seen in the lower extremities, bilaterally. -No evidence of popliteal cyst, bilaterally.   *See table(s) above for measurements and observations. Electronically signed by Servando Snare MD on 05/30/2022 at 1:49:50 PM.    Final    CT Angio Chest Pulmonary Embolism (PE) W or WO Contrast  Addendum Date: 05/29/2022   ADDENDUM REPORT: 05/29/2022 15:05 ADDENDUM: The findings of the case were discussed with Dr. Florene Glen by telephone at 2:45 p.m. on 05/29/2022 by Dr. Ree Edman. Following discussion, the above described findings of pulmonary embolism are favored to represent acute pulmonary emboli  involving the lobar and segmental branches of the right upper lobe rather than chronic residual thrombus related to the previous CT. ADDENDED IMPRESSION: 1. Examination is positive for acute pulmonary embolism involving the lobar and segmental branches of the right upper lobe. 2. The RV to LV ratio is elevated at 1.4, favored to be secondary to underlying left ventricular hypertrophy rather than acute right heart strain, however correlation with echocardiography may be helpful to better assess. 3. Mild bibasilar atelectasis. Lungs are otherwise clear. 4. Aortic and coronary artery atherosclerosis (ICD10-I70.0). Electronically Signed   By: Davina Poke D.O.   On: 05/29/2022 15:05   Result Date: 05/29/2022 CLINICAL DATA:  Dyspnea, chronic, chest wall or pleura disease suspected. Known PE EXAM: CT ANGIOGRAPHY CHEST WITH CONTRAST TECHNIQUE: Multidetector CT imaging of the chest was performed using the standard protocol during bolus administration of intravenous contrast. Multiplanar CT image reconstructions and MIPs were obtained to evaluate the vascular anatomy. RADIATION DOSE REDUCTION: This exam was performed according to the departmental dose-optimization program which includes automated exposure control, adjustment of the mA and/or kV according to patient size and/or use of iterative reconstruction technique. CONTRAST:  71mL OMNIPAQUE IOHEXOL 350 MG/ML SOLN COMPARISON:  CT 05/05/2021 FINDINGS: Cardiovascular: Satisfactory opacification of the pulmonary arteries. Significant interval reduction of the previously seen pulmonary emboli. Small residual thrombus involving lobar and segmental branches of the right upper lobe (series 6, images 91-103). No residual right lower lobe or left-sided pulmonary emboli are seen. The RV to LV ratio is elevated at 1.4, favored to be secondary to underlying left ventricular hypertrophy rather than acute right heart strain. Thoracic aorta is nonaneurysmal. Atherosclerotic  calcifications of the aorta and coronary arteries. No pericardial effusion. Right IJ central line terminates within the right atrium. Mediastinum/Nodes: No enlarged mediastinal, hilar, or axillary lymph nodes. Thyroid gland, trachea, and esophagus demonstrate no significant findings. Lungs/Pleura: Mild bibasilar subsegmental atelectasis. Lungs are otherwise clear. No pleural effusion or pneumothorax. Upper Abdomen: No new or acute findings. Musculoskeletal: No new or acute findings. Prominent gynecomastia. Left chest wall loop recorder. Review of the MIP images confirms the above findings. IMPRESSION: 1. Significant interval reduction of the previously seen pulmonary emboli. Small residual thrombus involving lobar and segmental branches of the right upper lobe. No residual right lower lobe or left-sided pulmonary emboli are seen. 2. The RV to LV ratio is elevated at 1.4, favored to be secondary to underlying left ventricular hypertrophy rather than acute right heart strain. 3. Mild bibasilar atelectasis.  Lungs are otherwise clear. 4. Aortic and coronary artery atherosclerosis (ICD10-I70.0). Electronically Signed: By: Davina Poke D.O. On: 05/29/2022 13:33   IR Fluoro Guide CV Line Right  Result Date: 05/29/2022 INDICATION: Poor venous access. In need of durable intravenous access for medication administration and blood draws. Patient with history severe left upper extremity contracture relying on his right upper extremity for mobility and  transfer purposes. As such, request made for placement of a tunneled jugular approach central venous catheter for durable intravenous access needs. EXAM: ULTRASOUND AND FLUOROSCOPIC GUIDED PLACEMENT OF TUNNELED CENTRAL VENOUS CATHETER MEDICATIONS: None ANESTHESIA/SEDATION: None FLUOROSCOPY TIME:  4 mGy COMPLICATIONS: None immediate. PROCEDURE: Informed written consent was obtained from the patient after a discussion of the risks, benefits, and alternatives to treatment.  Questions regarding the procedure were encouraged and answered. The right neck and chest were prepped with chlorhexidine in a sterile fashion, and a sterile drape was applied covering the operative field. Maximum barrier sterile technique with sterile gowns and gloves were used for the procedure. A timeout was performed prior to the initiation of the procedure. After the overlying soft tissues were anesthetized, a small venotomy incision was created and a micropuncture kit was utilized to access the internal jugular vein. Real-time ultrasound guidance was utilized for vascular access including the acquisition of a permanent ultrasound image documenting patency of the accessed vessel. The microwire was utilized to measure appropriate catheter length. The micropuncture sheath was exchanged for a peel-away sheath over a guidewire. A 5 [place] dual lumen tunneled central venous catheter measuring 25 cm was tunneled in a retrograde fashion from the anterior chest wall to the venotomy incision. The catheter was then placed through the peel-away sheath with tip ultimately positioned at the superior caval-atrial junction. Final catheter positioning was confirmed and documented with a spot radiographic image. The catheter aspirates and flushes normally. The catheter was flushed with appropriate volume heparin dwells. The catheter exit site was secured with a 0-Prolene retention suture. The venotomy incision was closed with Dermabond and Steri-strips. Dressings were applied. The patient tolerated the procedure well without immediate post procedural complication. FINDINGS: After catheter placement, the tip lies within the superior cavoatrial junction. The catheter aspirates and flushes normally and is ready for immediate use. IMPRESSION: Successful placement of 25 cm dual lumen tunneled central venous catheter via the right internal jugular vein with tip terminating at the superior caval atrial junction. The catheter is ready  for immediate use. Electronically Signed   By: Simonne Come M.D.   On: 05/29/2022 12:18   IR US Guide Vasc Access Right  Result Date: 05/29/2022 INDICATION: Poor venous access. In need of durable intravenous access for medication administration and blood draws. Patient with history severe left upper extremity contracture relying on his right upper extremity for mobility and transfer purposes. As such, request made for placement of a tunneled jugular approach central venous catheter for durable intravenous access needs. EXAM: ULTRASOUND AND FLUOROSCOPIC GUIDED PLACEMENT OF TUNNELED CENTRAL VENOUS CATHETER MEDICATIONS: None ANESTHESIA/SEDATION: None FLUOROSCOPY TIME:  4 mGy COMPLICATIONS: None immediate. PROCEDURE: Informed written consent was obtained from the patient after a discussion of the risks, benefits, and alternatives to treatment. Questions regarding the procedure were encouraged and answered. The right neck and chest were prepped with chlorhexidine in a sterile fashion, and a sterile drape was applied covering the operative field. Maximum barrier sterile technique with sterile gowns and gloves were used for the procedure. A timeout was performed prior to the initiation of the procedure. After the overlying soft tissues were anesthetized, a small venotomy incision was created and a micropuncture kit was utilized to access the internal jugular vein. Real-time ultrasound guidance was utilized for vascular access including the acquisition of a permanent ultrasound image documenting patency of the accessed vessel. The microwire was utilized to measure appropriate catheter length. The micropuncture sheath was exchanged for a peel-away sheath over a  guidewire. A 5 [place] dual lumen tunneled central venous catheter measuring 25 cm was tunneled in a retrograde fashion from the anterior chest wall to the venotomy incision. The catheter was then placed through the peel-away sheath with tip ultimately positioned at  the superior caval-atrial junction. Final catheter positioning was confirmed and documented with a spot radiographic image. The catheter aspirates and flushes normally. The catheter was flushed with appropriate volume heparin dwells. The catheter exit site was secured with a 0-Prolene retention suture. The venotomy incision was closed with Dermabond and Steri-strips. Dressings were applied. The patient tolerated the procedure well without immediate post procedural complication. FINDINGS: After catheter placement, the tip lies within the superior cavoatrial junction. The catheter aspirates and flushes normally and is ready for immediate use. IMPRESSION: Successful placement of 25 cm dual lumen tunneled central venous catheter via the right internal jugular vein with tip terminating at the superior caval atrial junction. The catheter is ready for immediate use. Electronically Signed   By: Sandi Mariscal M.D.   On: 05/29/2022 12:18   [place] EKG SITE RITE  Result Date: 05/28/2022 If Site Rite image not attached, placement could not be confirmed due to current cardiac rhythm.  DG Chest Port 1 View  Result Date: 05/28/2022 CLINICAL DATA:  Shortness of breath and weakness EXAM: PORTABLE CHEST 1 VIEW COMPARISON:  Portable exam 1352 hours compared to 09/09/2021 FINDINGS: Loop recorder projects over chest. Upper normal heart size. Mediastinal contours and pulmonary vascularity normal. Subsegmental atelectasis RIGHT base. Lungs otherwise clear. No pulmonary infiltrate, pleural effusion, or pneumothorax. Osseous structures unremarkable. IMPRESSION: Subsegmental atelectasis RIGHT base. Electronically Signed   By: Lavonia Dana M.D.   On: 05/28/2022 14:10   CUP PACEART REMOTE DEVICE CHECK  Result Date: 05/20/2022 ILR summary report received. Battery status OK. Normal device function. No new symptom, tachy, brady, or pause episodes. No new AF episodes. Monthly summary reports and ROV/PRN LA   Microbiology: Recent Results  (from the past 240 hour(s))  Remove and replace urinary cath (placed > 5 days) then obtain urine culture from new indwelling urinary catheter.     Status: Abnormal   Collection Time: 06/08/22  8:28 AM   Specimen: Urine, Catheterized  Result Value Ref Range Status   Specimen Description URINE, CATHETERIZED  Final   Special Requests   Final    NONE Performed at [facility], [address], [place],  [address]    Culture >=100,000 COLONIES/mL PROVIDENCIA RETTGERI (A)  Final   Report Status 06/10/2022 FINAL  Final   Organism ID, Bacteria PROVIDENCIA RETTGERI (A)  Final      Susceptibility   Providencia rettgeri - MIC*    AMPICILLIN RESISTANT Resistant     CEFAZOLIN >=64 RESISTANT Resistant     CEFEPIME <=0.12 SENSITIVE Sensitive     CEFTRIAXONE <=0.25 SENSITIVE Sensitive     CIPROFLOXACIN <=0.25 SENSITIVE Sensitive     GENTAMICIN <=1 SENSITIVE Sensitive     IMIPENEM 0.5 SENSITIVE Sensitive     NITROFURANTOIN 128 RESISTANT Resistant     TRIMETH/SULFA <=20 SENSITIVE Sensitive     AMPICILLIN/SULBACTAM 16 INTERMEDIATE Intermediate     PIP/TAZO <=4 SENSITIVE Sensitive     * >=100,000 COLONIES/mL PROVIDENCIA RETTGERI  Blood culture (routine x 2)     Status: None (Preliminary result)   Collection Time: 06/08/22  5:15 PM   Specimen: BLOOD RIGHT HAND  Result Value Ref Range Status   Specimen Description BLOOD RIGHT HAND  Final   Special Requests  Final    BOTTLES DRAWN AEROBIC AND ANAEROBIC Blood Culture results may not be optimal due to an inadequate volume of blood received in culture bottles   Culture   Final    NO GROWTH 3 DAYS Performed at [facility], [address] [place], [place] [address]    Report Status PENDING  Incomplete  Blood culture (routine x 2)     Status: None (Preliminary result)   Collection Time: 06/08/22  5:20 PM   Specimen: BLOOD LEFT ARM  Result Value Ref Range Status   Specimen Description BLOOD LEFT ARM  Final   Special Requests    Final    BOTTLES DRAWN AEROBIC AND ANAEROBIC Blood Culture adequate volume   Culture   Final    NO GROWTH 3 DAYS Performed at [facility], [address], [place], [place] [address]    Report Status PENDING  Incomplete     Labs: Basic Metabolic Panel: Recent Labs  Lab 06/08/22 0832 06/09/22 0810  NA 134* 137  K 4.6 3.5  CL 104 109  CO2 19* 21*  GLUCOSE 165* 89  BUN 28* 18  CREATININE 0.92 0.40*  CALCIUM 9.2 8.8*   Liver Function Tests: No results for input(s): "AST", "ALT", "ALKPHOS", "BILITOT", "PROT", "ALBUMIN" in the last 168 hours. No results for input(s): "LIPASE", "AMYLASE" in the last 168 hours. No results for input(s): "AMMONIA" in the last 168 hours. CBC: Recent Labs  Lab 06/08/22 0832 06/08/22 1157 06/09/22 0810  WBC 15.8* 16.0* 10.3  NEUTROABS 13.9*  --   --   HGB 14.4 12.0* 10.5*  HCT 42.9 38.0* 31.1*  MCV 88.6 91.3 88.6  PLT 234 206 167   Cardiac Enzymes: Recent Labs  Lab 06/08/22 1704  CKTOTAL 28*   BNP: BNP (last 3 results) Recent Labs    05/28/22 1409  BNP 30.2    ProBNP (last 3 results) No results for input(s): "PROBNP" in the last 8760 hours.  CBG: No results for input(s): "GLUCAP" in the last 168 hours.     Signed:  Zannie Cove MD.  [facility] 06/11/2022, 10:57 AM

## 2022-06-11 NOTE — TOC Transition Note (Signed)
Transition of Care [facility]) - CM/SW Discharge Note   Patient Details  Name: [name] MRN: [id number] Date of Birth: 10-25-1945  [facility] CM/SW Contact:  Levonne Lapping, RN Phone Number: 06/11/2022, 9:57 AM   Clinical Narrative:   Patient will DC to home today with [facility]. PTAR has been scheduled and anticipate 10:15 pick up. Hospital bed to be delivered to home. Wife stated she didn't want to wait for bed delivery before scheduling her Husband's transport to home. No other DME needed, per Wife.  No Additional TOC needs.      Patient Goals and CMS Choice      Discharge Placement                         Discharge Plan and Services Additional resources added to the After Visit Summary for                                       Social Determinants of Health (SDOH) Interventions SDOH Screenings   Food Insecurity: No Food Insecurity (06/08/2022)  Housing: Low Risk  (06/08/2022)  Transportation Needs: No Transportation Needs (06/08/2022)  Utilities: Not At Risk (06/08/2022)  Depression (PHQ2-9): Low Risk  (05/20/2022)  Tobacco Use: Low Risk  (06/11/2022)     Readmission Risk Interventions    05/06/2021   11:43 AM  Readmission Risk Prevention Plan  Transportation Screening Complete  PCP or Specialist Appt within 5-7 Days Complete  Home Care Screening Complete  Medication Review (RN CM) Complete

## 2022-06-12 ENCOUNTER — Telehealth: Payer: Self-pay

## 2022-06-12 NOTE — Telephone Encounter (Signed)
Per Carolynn Sayers, RN with [employer]:   "Mr. Gellerman discharged home today with [facility] without IV ABX. "  Beryle Flock, RN

## 2022-06-13 LAB — CULTURE, BLOOD (ROUTINE X 2)
Culture: NO GROWTH
Culture: NO GROWTH
Special Requests: ADEQUATE

## 2022-06-19 ENCOUNTER — Inpatient Hospital Stay: Payer: Medicare Other | Admitting: Hematology and Oncology

## 2022-06-20 LAB — CUP PACEART REMOTE DEVICE CHECK
Date Time Interrogation Session: 20240214231831
Implantable Pulse Generator Implant Date: 20220811

## 2022-06-23 ENCOUNTER — Ambulatory Visit: Payer: Medicare Other

## 2022-06-23 DIAGNOSIS — I6340 Cerebral infarction due to embolism of unspecified cerebral artery: Secondary | ICD-10-CM

## 2022-06-26 ENCOUNTER — Telehealth: Payer: Self-pay | Admitting: *Deleted

## 2022-06-26 NOTE — Progress Notes (Signed)
Carelink Summary Report / Loop Recorder 

## 2022-06-26 NOTE — Telephone Encounter (Signed)
Sonia Baller calling to see if Dr.Lovorn can write a letter for financial POA assistance for the patient. A letter will be faxed as a sample  date of injury/type/mental capacity as of 11/2-23.She is requesting if you can write it. She is aware that Dr. Dagoberto Ligas is out of office until next week.

## 2022-07-01 ENCOUNTER — Telehealth: Payer: Medicare Other | Admitting: Internal Medicine

## 2022-07-01 NOTE — Telephone Encounter (Signed)
Pt has never followed up in our clinic- since discharge from inpt rehab.  I am unable to write for anything until I actually see this patient.  He was admitted into inpt Rehab in 8/22.

## 2022-07-02 NOTE — Telephone Encounter (Signed)
Spoke with Sonia Baller re: letter for the patient. Relayed message per Dr. Dagoberto Ligas.  Pt has never followed up in our clinic- since discharge from inpt rehab.  I am unable to write for anything until I actually see this patient.  He was admitted into inpt Rehab in 8/22.     Sonia Baller has sent a second fax request that Dr. Florentina Jenny letter will state patient's condition during her time of care only.  Sonia Baller is aware we will f/u up pending her 2nd request for this letter.

## 2022-07-09 ENCOUNTER — Encounter: Payer: Self-pay | Admitting: Physical Medicine and Rehabilitation

## 2022-08-04 NOTE — Progress Notes (Signed)
Carelink Summary Report / Loop Recorder 

## 2022-10-31 ENCOUNTER — Telehealth: Payer: Self-pay | Admitting: Cardiology

## 2022-10-31 NOTE — Telephone Encounter (Signed)
Wife called to report patient is in hospice care and will no longer need device monitoring.

## 2022-10-31 NOTE — Telephone Encounter (Signed)
I spoke with the patient wife and canceled all upcoming remotes. I took him out the [facility] system. She already called [employer] and ordered a return kit.
